# Patient Record
Sex: Female | Born: 1960 | Race: White | Hispanic: No | Marital: Married | Smoking: Never smoker
Health system: Southern US, Community
[De-identification: ages and names within clinical notes are randomized; demographics above are authoritative.]

## PROBLEM LIST (undated history)

## (undated) DIAGNOSIS — T7840XA Allergy, unspecified, initial encounter: Secondary | ICD-10-CM

## (undated) DIAGNOSIS — F319 Bipolar disorder, unspecified: Secondary | ICD-10-CM

## (undated) DIAGNOSIS — F32A Depression, unspecified: Secondary | ICD-10-CM

## (undated) DIAGNOSIS — E039 Hypothyroidism, unspecified: Secondary | ICD-10-CM

## (undated) DIAGNOSIS — M1990 Unspecified osteoarthritis, unspecified site: Secondary | ICD-10-CM

## (undated) DIAGNOSIS — Z87442 Personal history of urinary calculi: Secondary | ICD-10-CM

## (undated) DIAGNOSIS — N12 Tubulo-interstitial nephritis, not specified as acute or chronic: Secondary | ICD-10-CM

## (undated) DIAGNOSIS — E119 Type 2 diabetes mellitus without complications: Secondary | ICD-10-CM

## (undated) DIAGNOSIS — M797 Fibromyalgia: Secondary | ICD-10-CM

## (undated) DIAGNOSIS — J189 Pneumonia, unspecified organism: Secondary | ICD-10-CM

## (undated) DIAGNOSIS — I10 Essential (primary) hypertension: Secondary | ICD-10-CM

## (undated) HISTORY — PX: BLADDER INSTILLATION: SUR156

## (undated) HISTORY — PX: FRENULECTOMY, LINGUAL: SHX1681

## (undated) HISTORY — DX: Depression, unspecified: F32.A

## (undated) HISTORY — DX: Type 2 diabetes mellitus without complications: E11.9

## (undated) HISTORY — PX: CHALAZION EXCISION: SHX213

## (undated) HISTORY — PX: COLONOSCOPY: SHX174

## (undated) HISTORY — DX: Allergy, unspecified, initial encounter: T78.40XA

## (undated) HISTORY — PX: PALATE SURGERY: SHX729

## (undated) HISTORY — PX: URETHRAL DILATION: SUR417

---

## 1994-03-17 HISTORY — PX: LAPAROSCOPIC CHOLECYSTECTOMY: SUR755

## 1995-03-18 DIAGNOSIS — N12 Tubulo-interstitial nephritis, not specified as acute or chronic: Secondary | ICD-10-CM

## 1995-03-18 HISTORY — DX: Tubulo-interstitial nephritis, not specified as acute or chronic: N12

## 1997-07-18 ENCOUNTER — Other Ambulatory Visit: Admission: RE | Admit: 1997-07-18 | Discharge: 1997-07-18 | Payer: Self-pay | Admitting: Family Medicine

## 1997-07-20 ENCOUNTER — Ambulatory Visit (HOSPITAL_COMMUNITY): Admission: RE | Admit: 1997-07-20 | Discharge: 1997-07-20 | Payer: Self-pay

## 1999-04-30 ENCOUNTER — Encounter: Payer: Self-pay | Admitting: Family Medicine

## 1999-04-30 ENCOUNTER — Ambulatory Visit (HOSPITAL_COMMUNITY): Admission: RE | Admit: 1999-04-30 | Discharge: 1999-04-30 | Payer: Self-pay | Admitting: Family Medicine

## 1999-10-10 ENCOUNTER — Encounter: Admission: RE | Admit: 1999-10-10 | Discharge: 1999-10-10 | Payer: Self-pay | Admitting: Hematology and Oncology

## 1999-10-11 ENCOUNTER — Ambulatory Visit (HOSPITAL_COMMUNITY): Admission: RE | Admit: 1999-10-11 | Discharge: 1999-10-11 | Payer: Self-pay | Admitting: *Deleted

## 1999-12-06 ENCOUNTER — Encounter: Admission: RE | Admit: 1999-12-06 | Discharge: 1999-12-06 | Payer: Self-pay | Admitting: Internal Medicine

## 2000-02-28 ENCOUNTER — Encounter: Admission: RE | Admit: 2000-02-28 | Discharge: 2000-02-28 | Payer: Self-pay | Admitting: Internal Medicine

## 2000-06-25 ENCOUNTER — Encounter: Admission: RE | Admit: 2000-06-25 | Discharge: 2000-06-25 | Payer: Self-pay | Admitting: Hematology and Oncology

## 2000-07-09 ENCOUNTER — Encounter: Admission: RE | Admit: 2000-07-09 | Discharge: 2000-07-09 | Payer: Self-pay | Admitting: Hematology and Oncology

## 2000-09-28 ENCOUNTER — Encounter: Admission: RE | Admit: 2000-09-28 | Discharge: 2000-09-28 | Payer: Self-pay | Admitting: *Deleted

## 2001-01-26 ENCOUNTER — Encounter: Admission: RE | Admit: 2001-01-26 | Discharge: 2001-01-26 | Payer: Self-pay

## 2001-02-16 ENCOUNTER — Encounter: Admission: RE | Admit: 2001-02-16 | Discharge: 2001-02-16 | Payer: Self-pay | Admitting: *Deleted

## 2001-03-12 ENCOUNTER — Encounter: Admission: RE | Admit: 2001-03-12 | Discharge: 2001-03-12 | Payer: Self-pay

## 2001-06-25 ENCOUNTER — Encounter: Admission: RE | Admit: 2001-06-25 | Discharge: 2001-06-25 | Payer: Self-pay | Admitting: Internal Medicine

## 2001-07-06 ENCOUNTER — Encounter: Admission: RE | Admit: 2001-07-06 | Discharge: 2001-07-06 | Payer: Self-pay | Admitting: *Deleted

## 2001-10-15 ENCOUNTER — Encounter: Admission: RE | Admit: 2001-10-15 | Discharge: 2001-10-15 | Payer: Self-pay | Admitting: Internal Medicine

## 2001-12-07 ENCOUNTER — Emergency Department (HOSPITAL_COMMUNITY): Admission: EM | Admit: 2001-12-07 | Discharge: 2001-12-07 | Payer: Self-pay | Admitting: *Deleted

## 2001-12-10 ENCOUNTER — Encounter: Admission: RE | Admit: 2001-12-10 | Discharge: 2001-12-10 | Payer: Self-pay | Admitting: Internal Medicine

## 2001-12-17 ENCOUNTER — Encounter: Admission: RE | Admit: 2001-12-17 | Discharge: 2001-12-17 | Payer: Self-pay | Admitting: Internal Medicine

## 2002-05-06 ENCOUNTER — Encounter: Admission: RE | Admit: 2002-05-06 | Discharge: 2002-05-06 | Payer: Self-pay | Admitting: Internal Medicine

## 2002-05-18 ENCOUNTER — Encounter: Admission: RE | Admit: 2002-05-18 | Discharge: 2002-05-18 | Payer: Self-pay | Admitting: Internal Medicine

## 2002-05-20 ENCOUNTER — Ambulatory Visit (HOSPITAL_COMMUNITY): Admission: RE | Admit: 2002-05-20 | Discharge: 2002-05-20 | Payer: Self-pay | Admitting: Internal Medicine

## 2002-07-11 ENCOUNTER — Encounter: Admission: RE | Admit: 2002-07-11 | Discharge: 2002-07-11 | Payer: Self-pay | Admitting: Internal Medicine

## 2002-07-18 ENCOUNTER — Encounter: Admission: RE | Admit: 2002-07-18 | Discharge: 2002-07-18 | Payer: Self-pay | Admitting: Internal Medicine

## 2002-07-25 ENCOUNTER — Encounter: Admission: RE | Admit: 2002-07-25 | Discharge: 2002-07-25 | Payer: Self-pay | Admitting: Internal Medicine

## 2002-09-01 ENCOUNTER — Encounter: Admission: RE | Admit: 2002-09-01 | Discharge: 2002-09-01 | Payer: Self-pay | Admitting: Internal Medicine

## 2003-02-13 ENCOUNTER — Encounter: Admission: RE | Admit: 2003-02-13 | Discharge: 2003-02-13 | Payer: Self-pay | Admitting: Internal Medicine

## 2003-02-14 ENCOUNTER — Encounter: Admission: RE | Admit: 2003-02-14 | Discharge: 2003-02-14 | Payer: Self-pay | Admitting: Internal Medicine

## 2003-06-13 ENCOUNTER — Encounter: Admission: RE | Admit: 2003-06-13 | Discharge: 2003-06-13 | Payer: Self-pay | Admitting: Internal Medicine

## 2003-09-25 ENCOUNTER — Encounter: Admission: RE | Admit: 2003-09-25 | Discharge: 2003-09-25 | Payer: Self-pay | Admitting: Internal Medicine

## 2003-10-10 ENCOUNTER — Encounter: Admission: RE | Admit: 2003-10-10 | Discharge: 2003-10-10 | Payer: Self-pay | Admitting: Internal Medicine

## 2004-02-20 ENCOUNTER — Ambulatory Visit: Payer: Self-pay | Admitting: Internal Medicine

## 2004-03-22 ENCOUNTER — Ambulatory Visit: Payer: Self-pay | Admitting: Internal Medicine

## 2005-10-23 ENCOUNTER — Encounter: Admission: RE | Admit: 2005-10-23 | Discharge: 2005-10-23 | Payer: Self-pay | Admitting: Family Medicine

## 2007-04-16 ENCOUNTER — Encounter: Admission: RE | Admit: 2007-04-16 | Discharge: 2007-04-16 | Payer: Self-pay | Admitting: Family Medicine

## 2007-11-03 ENCOUNTER — Other Ambulatory Visit: Admission: RE | Admit: 2007-11-03 | Discharge: 2007-11-03 | Payer: Self-pay | Admitting: Obstetrics and Gynecology

## 2008-01-25 ENCOUNTER — Other Ambulatory Visit: Admission: RE | Admit: 2008-01-25 | Discharge: 2008-01-25 | Payer: Self-pay | Admitting: Obstetrics and Gynecology

## 2009-04-09 ENCOUNTER — Encounter: Admission: RE | Admit: 2009-04-09 | Discharge: 2009-04-09 | Payer: Self-pay | Admitting: Family Medicine

## 2010-04-07 ENCOUNTER — Encounter: Payer: Self-pay | Admitting: Family Medicine

## 2011-04-10 ENCOUNTER — Other Ambulatory Visit: Payer: Self-pay | Admitting: Family Medicine

## 2011-04-10 DIAGNOSIS — Z1231 Encounter for screening mammogram for malignant neoplasm of breast: Secondary | ICD-10-CM

## 2011-06-12 ENCOUNTER — Emergency Department (HOSPITAL_COMMUNITY)
Admission: EM | Admit: 2011-06-12 | Discharge: 2011-06-12 | Disposition: A | Discharge status: Home / Self Care | Payer: BC Managed Care – PPO | Attending: Emergency Medicine | Admitting: Emergency Medicine

## 2011-06-12 ENCOUNTER — Encounter (HOSPITAL_COMMUNITY): Payer: Self-pay | Admitting: Physical Medicine and Rehabilitation

## 2011-06-12 ENCOUNTER — Emergency Department (HOSPITAL_COMMUNITY): Payer: BC Managed Care – PPO

## 2011-06-12 DIAGNOSIS — D3500 Benign neoplasm of unspecified adrenal gland: Secondary | ICD-10-CM | POA: Insufficient documentation

## 2011-06-12 DIAGNOSIS — K5289 Other specified noninfective gastroenteritis and colitis: Secondary | ICD-10-CM | POA: Insufficient documentation

## 2011-06-12 DIAGNOSIS — I10 Essential (primary) hypertension: Secondary | ICD-10-CM | POA: Insufficient documentation

## 2011-06-12 DIAGNOSIS — K529 Noninfective gastroenteritis and colitis, unspecified: Secondary | ICD-10-CM

## 2011-06-12 DIAGNOSIS — R319 Hematuria, unspecified: Secondary | ICD-10-CM | POA: Insufficient documentation

## 2011-06-12 DIAGNOSIS — D259 Leiomyoma of uterus, unspecified: Secondary | ICD-10-CM | POA: Insufficient documentation

## 2011-06-12 DIAGNOSIS — R109 Unspecified abdominal pain: Secondary | ICD-10-CM | POA: Insufficient documentation

## 2011-06-12 HISTORY — DX: Essential (primary) hypertension: I10

## 2011-06-12 HISTORY — DX: Bipolar disorder, unspecified: F31.9

## 2011-06-12 HISTORY — DX: Hypothyroidism, unspecified: E03.9

## 2011-06-12 LAB — URINALYSIS, ROUTINE W REFLEX MICROSCOPIC
Bilirubin Urine: NEGATIVE
Glucose, UA: NEGATIVE mg/dL
Ketones, ur: NEGATIVE mg/dL
Nitrite: NEGATIVE
Protein, ur: NEGATIVE mg/dL
Specific Gravity, Urine: 1.022 (ref 1.005–1.030)
Urobilinogen, UA: 1 mg/dL (ref 0.0–1.0)
pH: 6 (ref 5.0–8.0)

## 2011-06-12 LAB — COMPREHENSIVE METABOLIC PANEL
ALT: 42 U/L — ABNORMAL HIGH (ref 0–35)
AST: 37 U/L (ref 0–37)
Albumin: 3.7 g/dL (ref 3.5–5.2)
Alkaline Phosphatase: 82 U/L (ref 39–117)
BUN: 22 mg/dL (ref 6–23)
CO2: 24 mEq/L (ref 19–32)
Calcium: 9.5 mg/dL (ref 8.4–10.5)
Chloride: 101 mEq/L (ref 96–112)
Creatinine, Ser: 0.61 mg/dL (ref 0.50–1.10)
GFR calc Af Amer: >90 mL/min (ref >90)
GFR calc non Af Amer: >90 mL/min (ref >90)
Glucose, Bld: 132 mg/dL — ABNORMAL HIGH (ref 70–99)
Potassium: 3.3 mEq/L — ABNORMAL LOW (ref 3.5–5.1)
Sodium: 137 mEq/L (ref 135–145)
Total Bilirubin: 0.5 mg/dL (ref 0.3–1.2)
Total Protein: 6.7 g/dL (ref 6.0–8.3)

## 2011-06-12 LAB — CBC
HCT: 42 % (ref 36.0–46.0)
Hemoglobin: 13.4 g/dL (ref 12.0–15.0)
MCH: 28.7 pg (ref 26.0–34.0)
MCHC: 31.9 g/dL (ref 30.0–36.0)
MCV: 89.9 fL (ref 78.0–100.0)
Platelets: 346 10*3/uL (ref 150–400)
RBC: 4.67 MIL/uL (ref 3.87–5.11)
RDW: 13.4 % (ref 11.5–15.5)
WBC: 11.6 10*3/uL — ABNORMAL HIGH (ref 4.0–10.5)

## 2011-06-12 LAB — URINE MICROSCOPIC-ADD ON

## 2011-06-12 LAB — DIFFERENTIAL
Basophils Absolute: 0 10*3/uL (ref 0.0–0.1)
Basophils Relative: 0 % (ref 0–1)
Eosinophils Absolute: 0 10*3/uL (ref 0.0–0.7)
Eosinophils Relative: 0 % (ref 0–5)
Lymphocytes Relative: 2 % — ABNORMAL LOW (ref 12–46)
Lymphs Abs: 0.2 10*3/uL — ABNORMAL LOW (ref 0.7–4.0)
Monocytes Absolute: 0.2 10*3/uL (ref 0.1–1.0)
Monocytes Relative: 2 % — ABNORMAL LOW (ref 3–12)
Neutro Abs: 11.2 10*3/uL — ABNORMAL HIGH (ref 1.7–7.7)
Neutrophils Relative %: 96 % — ABNORMAL HIGH (ref 43–77)

## 2011-06-12 LAB — LIPASE, BLOOD: Lipase: 21 U/L (ref 11–59)

## 2011-06-12 MED ORDER — ONDANSETRON HCL 4 MG/2ML IJ SOLN
4.0000 mg | Freq: Once | INTRAMUSCULAR | Status: AC
Start: 2011-06-12 — End: 2011-06-12
  Administered 2011-06-12: 4 mg via INTRAVENOUS
  Filled 2011-06-12: qty 2

## 2011-06-12 MED ORDER — NAPROXEN SODIUM 220 MG PO TABS: 440.0000 mg | ORAL_TABLET | Freq: Two times a day (BID) | ORAL | Status: DC | PRN

## 2011-06-12 MED ORDER — POTASSIUM CHLORIDE 20 MEQ/15ML (10%) PO LIQD
40.0000 meq | Freq: Once | ORAL | Status: AC
  Administered 2011-06-12: 40 meq via ORAL
  Filled 2011-06-12 (×2): qty 15

## 2011-06-12 MED ORDER — POTASSIUM CHLORIDE CRYS ER 20 MEQ PO TBCR
40.0000 meq | EXTENDED_RELEASE_TABLET | Freq: Once | ORAL | Status: DC
  Filled 2011-06-12: qty 2

## 2011-06-12 MED ORDER — SODIUM CHLORIDE 0.9 % IV BOLUS (SEPSIS)
1000.0000 mL | Freq: Once | INTRAVENOUS | Status: AC
  Administered 2011-06-12: 1000 mL via INTRAVENOUS

## 2011-06-12 MED ORDER — OXYCODONE HCL 5 MG PO TABS: 5.0000 mg | ORAL_TABLET | ORAL | Status: AC | PRN

## 2011-06-12 MED ORDER — HYDROMORPHONE HCL PF 1 MG/ML IJ SOLN
1.0000 mg | Freq: Once | INTRAMUSCULAR | Status: AC
  Administered 2011-06-12: 1 mg via INTRAVENOUS
  Filled 2011-06-12: qty 1

## 2011-06-12 MED ORDER — FAMOTIDINE IN NACL 20-0.9 MG/50ML-% IV SOLN
20.0000 mg | Freq: Once | INTRAVENOUS | Status: AC
  Administered 2011-06-12: 20 mg via INTRAVENOUS
  Filled 2011-06-12: qty 50

## 2011-06-12 MED ORDER — OXYCODONE-ACETAMINOPHEN 5-325 MG PO TABS: 1.0000 | ORAL_TABLET | ORAL | Status: DC | PRN

## 2011-06-12 MED ORDER — ONDANSETRON HCL 8 MG PO TABS: 8.0000 mg | ORAL_TABLET | Freq: Three times a day (TID) | ORAL | Status: AC | PRN

## 2011-06-12 MED ORDER — ONDANSETRON HCL 4 MG/2ML IJ SOLN
4.0000 mg | Freq: Once | INTRAMUSCULAR | Status: AC
  Administered 2011-06-12: 4 mg via INTRAVENOUS
  Filled 2011-06-12: qty 2

## 2011-06-12 MED ORDER — SODIUM CHLORIDE 0.9 % IV SOLN
INTRAVENOUS | Status: DC
  Administered 2011-06-12: 17:00:00 via INTRAVENOUS

## 2011-06-12 NOTE — Discharge Instructions (Signed)
Abdominal Pain (Nonspecific) Your exam might not show the exact reason you have abdominal pain. Since there are many different causes of abdominal pain, another checkup and more tests may be needed. It is very important to follow up for lasting (persistent) or worsening symptoms. A possible cause of abdominal pain in any person who still has his or her appendix is acute appendicitis. Appendicitis is often hard to diagnose. Normal blood tests, urine tests, ultrasound, and CT scans do not completely rule out early appendicitis or other causes of abdominal pain. Sometimes, only the changes that happen over time will allow appendicitis and other causes of abdominal pain to be determined. Other potential problems that may require surgery may also take time to become more apparent. Because of this, it is important that you follow all of the instructions below. HOME CARE INSTRUCTIONS   Rest as much as possible.   Do not eat solid food until your pain is gone.   While adults or children have pain: A diet of water, weak decaffeinated tea, broth or bouillon, gelatin, oral rehydration solutions (ORS), frozen ice pops, or ice chips may be helpful.   When pain is gone in adults or children: Start a light diet (dry toast, crackers, applesauce, or white rice). Increase the diet slowly as long as it does not bother you. Eat no dairy products (including cheese and eggs) and no spicy, fatty, fried, or high-fiber foods.   Use no alcohol, caffeine, or cigarettes.   Take your regular medicines unless your caregiver told you not to.   Take any prescribed medicine as directed.   Only take over-the-counter or prescription medicines for pain, discomfort, or fever as directed by your caregiver. Do not give aspirin to children.  If your caregiver has given you a follow-up appointment, it is very important to keep that appointment. Not keeping the appointment could result in a permanent injury and/or lasting (chronic) pain  and/or disability. If there is any problem keeping the appointment, you must call to reschedule.  SEEK IMMEDIATE MEDICAL CARE IF:   Your pain is not gone in 24 hours.   Your pain becomes worse, changes location, or feels different.   You or your child has an oral temperature above 102 F (38.9 C), not controlled by medicine.   Your baby is older than 3 months with a rectal temperature of 102 F (38.9 C) or higher.   Your baby is 56 months old or younger with a rectal temperature of 100.4 F (38 C) or higher.   You have shaking chills.   You keep throwing up (vomiting) or cannot drink liquids.   There is blood in your vomit or you see blood in your bowel movements.   Your bowel movements become dark or black.   You have frequent bowel movements.   Your bowel movements stop (become blocked) or you cannot pass gas.   You have bloody, frequent, or painful urination.   You have yellow discoloration in the skin or whites of the eyes.   Your stomach becomes bloated or bigger.   You have dizziness or fainting.   You have chest or back pain.  MAKE SURE YOU:   Understand these instructions.   Will watch your condition.   Will get help right away if you are not doing well or get worse.  Document Released: 03/03/2005 Document Revised: 02/20/2011 Document Reviewed: 01/29/2009 [facility] Patient Information 2012 [place], [place].Clear Liquid Diet The clear liquid dietconsists of foods that are liquid or will  become liquid at room temperature.You should be able to see through the liquid and beverages. Examples of foods allowed on a clear liquid diet include fruit juice, broth or bouillon, gelatin, or frozen ice pops. The purpose of this diet is to provide necessary fluid, electrolytes such as sodium and potassium, and energy to keep the body functioning during times when you are not able to consume a regular diet.A clear liquid diet should not be continued for long periods of time as  it is not nutritionally adequate.  REASONS FOR USING A CLEAR LIQUID DIET  In sudden onset (acute) conditions for a patient before or after surgery.   As the first step in oral feeding.   For fluid and electrolyte replacement in diarrheal diseases.   As a diet before certain medical tests are performed.  ADEQUACY The clear liquid diet is adequate only in ascorbic acid, according to the Recommended Dietary Allowances of the [employer]. CHOOSING FOODS Breads and Starches  Allowed:  None are allowed.   Avoid: All are avoided.  Vegetables  Allowed:  Strained tomato or vegetable juice.   Avoid: Any others.  Fruit  Allowed:  Strained fruit juices and fruit drinks. Include 1 serving of citrus or vitamin C-enriched fruit juice daily.   Avoid: Any others.  Meat and Meat Substitutes  Allowed:  None are allowed.   Avoid: All are avoided.  Milk  Allowed:  None are allowed.   Avoid: All are avoided.  Soups and Combination Foods  Allowed:  Clear bouillon, broth, or strained broth-based soups.   Avoid: Any others.  Desserts and Sweets  Allowed:  Sugar, honey. High protein gelatin. Flavored gelatin, ices, or frozen ice pops that do not contain milk.   Avoid: Any others.  Fats and Oils  Allowed:  None are allowed.   Avoid: All are avoided.  Beverages  Allowed: Cereal beverages, coffee (regular or decaffeinated), tea, or soda at the discretion of your caregiver.   Avoid: Any others.  Condiments  Allowed:  Iodized salt.   Avoid: Any others, including pepper.  Supplements  Allowed:  Liquid nutrition beverages.   Avoid: Any others that contain lactose or fiber.  SAMPLE MEAL PLAN Breakfast  4 oz (120 mL) strained orange juice.    to 1 cup (125 to 250 mL) gelatin (plain or fortified).   1 cup (250 mL) beverage (coffee or tea).   Sugar, if desired.  Midmorning Snack   cup (125 mL) gelatin (plain or fortified).  Lunch  1 cup (250 mL) broth or  consomm.   4 oz (120 mL) strained grapefruit juice.    cup (125 mL) gelatin (plain or fortified).   1 cup (250 mL) beverage (coffee or tea).   Sugar, if desired.  Midafternoon Snack   cup (125 mL) fruit ice.    cup (125 mL) strained fruit juice.  Dinner  1 cup (250 mL) broth or consomm.    cup (125 mL) cranberry juice.    cup (125 mL) flavored gelatin (plain or fortified).   1 cup (250 mL) beverage (coffee or tea).   Sugar, if desired.  Evening Snack  4 oz (120 mL) strained apple juice (vitamin C-fortified).    cup (125 mL) flavored gelatin (plain or fortified).  Document Released: 03/03/2005 Document Revised: 02/20/2011 Document Reviewed: 05/31/2010 [facility] Patient Information 2012 [place], [place].Hematuria, Adult Hematuria (blood in your urine) can be caused by a bladder infection (cystitis), kidney infection (pyelonephritis), prostate infection (prostatitis), or kidney stone. Infections  will usually respond to antibiotics (medications which kill germs), and a kidney stone will usually pass through your urine without further treatment. If you were put on antibiotics, take all the medicine until gone. You may feel better in a few days, but take all of your medicine or the infection may not respond and become more difficult to treat. If antibiotics were not given, an infection did not cause the blood in the urine. A further work up to find out the reason may be needed. HOME CARE INSTRUCTIONS   Drink lots of fluid, 3 to 4 quarts a day. If you have been diagnosed with an infection, cranberry juice is especially recommended, in addition to large amounts of water.   Avoid caffeine, tea, and carbonated beverages, because they tend to irritate the bladder.   Avoid alcohol as it may irritate the prostate.   Only take over-the-counter or prescription medicines for pain, discomfort, or fever as directed by your caregiver.   If you have been diagnosed with a kidney stone  follow your caregivers instructions regarding straining your urine to catch the stone.  TO PREVENT FURTHER INFECTIONS:  Empty the bladder often. Avoid holding urine for long periods of time.   After a bowel movement, women should cleanse front to back. Use each tissue only once.   Empty the bladder before and after sexual intercourse if you are a female.   Return to your caregiver if you develop back pain, fever, nausea (feeling sick to your stomach), vomiting, or your symptoms (problems) are not better in 3 days. Return sooner if you are getting worse.  If you have been requested to return for further testing make sure to keep your appointments. If an infection is not the cause of blood in your urine, X-rays may be required. Your caregiver will discuss this with you. SEEK IMMEDIATE MEDICAL CARE IF:   You have a persistent fever over 102 F (38.9 C).   You develop severe vomiting and are unable to keep the medication down.   You develop severe back or abdominal pain despite taking your medications.   You begin passing a large amount of blood or clots in your urine.   You feel extremely weak or faint, or pass out.  MAKE SURE YOU:   Understand these instructions.   Will watch your condition.   Will get help right away if you are not doing well or get worse.  Document Released: 03/03/2005 Document Revised: 02/20/2011 Document Reviewed: 10/21/2007 [facility] Patient Information 2012 [place], [place].Viral Gastroenteritis Viral gastroenteritis is also known as stomach flu. This condition affects the stomach and intestinal tract. It can cause sudden diarrhea and vomiting. The illness typically lasts 3 to 8 days. Most people develop an immune response that eventually gets rid of the virus. While this natural response develops, the virus can make you quite ill. CAUSES  Many different viruses can cause gastroenteritis, such as rotavirus or noroviruses. You can catch one of these viruses by  consuming contaminated food or water. You may also catch a virus by sharing utensils or other personal items with an infected person or by touching a contaminated surface. SYMPTOMS  The most common symptoms are diarrhea and vomiting. These problems can cause a severe loss of body fluids (dehydration) and a body salt (electrolyte) imbalance. Other symptoms may include:  Fever.   Headache.   Fatigue.   Abdominal pain.  DIAGNOSIS  Your caregiver can usually diagnose viral gastroenteritis based on your symptoms and a physical exam.  A stool sample may also be taken to test for the presence of viruses or other infections. TREATMENT  This illness typically goes away on its own. Treatments are aimed at rehydration. The most serious cases of viral gastroenteritis involve vomiting so severely that you are not able to keep fluids down. In these cases, fluids must be given through an intravenous line (IV). HOME CARE INSTRUCTIONS   Drink enough fluids to keep your urine clear or pale yellow. Drink small amounts of fluids frequently and increase the amounts as tolerated.   Ask your caregiver for specific rehydration instructions.   Avoid:   Foods high in sugar.   Alcohol.   Carbonated drinks.   Tobacco.   Juice.   Caffeine drinks.   Extremely hot or cold fluids.   Fatty, greasy foods.   Too much intake of anything at one time.   Dairy products until 24 to 48 hours after diarrhea stops.   You may consume probiotics. Probiotics are active cultures of beneficial bacteria. They may lessen the amount and number of diarrheal stools in adults. Probiotics can be found in yogurt with active cultures and in supplements.   Wash your hands well to avoid spreading the virus.   Only take over-the-counter or prescription medicines for pain, discomfort, or fever as directed by your caregiver. Do not give aspirin to children. Antidiarrheal medicines are not recommended.   Ask your caregiver if you  should continue to take your regular prescribed and over-the-counter medicines.   Keep all follow-up appointments as directed by your caregiver.  SEEK IMMEDIATE MEDICAL CARE IF:   You are unable to keep fluids down.   You do not urinate at least once every 6 to 8 hours.   You develop shortness of breath.   You notice blood in your stool or vomit. This may look like coffee grounds.   You have abdominal pain that increases or is concentrated in one small area (localized).   You have persistent vomiting or diarrhea.   You have a fever.   The patient is a child younger than 3 months, and he or she has a fever.   The patient is a child older than 3 months, and he or she has a fever and persistent symptoms.   The patient is a child older than 3 months, and he or she has a fever and symptoms suddenly get worse.   The patient is a baby, and he or she has no tears when crying.  MAKE SURE YOU:   Understand these instructions.   Will watch your condition.   Will get help right away if you are not doing well or get worse.  Document Released: 03/03/2005 Document Revised: 02/20/2011 Document Reviewed: 12/18/2010 [facility] Patient Information 2012 [place], [place].

## 2011-06-12 NOTE — ED Notes (Signed)
Pt presents to department for evaluation of N/V/D and LLQ abdominal pain. Pt states onset early this morning, started to LLQ pain and then progressed to vomiting and diarrhea. States she has been unable to keep down food/fluids all morning. 2/10 LLQ "soreness" and the time. Denies urinary problems. Pt alert and oriented x4.

## 2011-06-12 NOTE — ED Notes (Signed)
Patient states onset couple of days nausea vomiting and diarrhea with abdominal pain LLQ with radiating left flank pain.  Pain 7-10/10 achy.  Airway intact bilateral equal chest rise and fall. Abdomen soft distended patient is obese.

## 2011-06-12 NOTE — ED Provider Notes (Addendum)
History     CSN: [id number]  Arrival date & time 06/12/11  1346   First MD Initiated Contact with Patient 06/12/11 1409      Chief Complaint  Patient presents with  . Abdominal Pain  . Nausea  . Diarrhea    (Consider location/radiation/quality/duration/timing/severity/associated sxs/prior treatment) Patient is a 51 y.o. female presenting with abdominal pain. The history is provided by the patient.  Abdominal Pain The primary symptoms of the illness include abdominal pain, nausea, vomiting and diarrhea. The primary symptoms of the illness do not include fever, fatigue, shortness of breath, hematemesis, hematochezia or dysuria. The current episode started 6 to 12 hours ago. The onset of the illness was gradual. The problem has been gradually improving.  The abdominal pain began 6 to 12 hours ago. The pain came on gradually. The abdominal pain has been gradually improving since its onset. The abdominal pain is located in the LLQ, left flank and right flank. The abdominal pain does not radiate. The severity of the abdominal pain is 8/10. The abdominal pain is relieved by passing flatus and bowel movement. Exacerbated by: Deep palpation.  Nausea began today.  The vomiting began today. Vomiting occurs 2 to 5 times per day. The emesis contains stomach contents.  The diarrhea began today. The diarrhea is watery [place], without blood or mucus). The diarrhea occurs 2 to 4 times per day.  The patient has had a change in bowel habit (Diarrhea today). Additional symptoms associated with the illness include anorexia. Symptoms associated with the illness do not include chills, diaphoresis, heartburn, constipation, urgency, hematuria, frequency or back pain. Significant associated medical issues do not include inflammatory bowel disease or diverticulitis.    Past Medical History  Diagnosis Date  . Hypertension   . Bipolar 1 disorder   . Hypothyroid     History reviewed. No pertinent past surgical  history.  No family history on file.  History  Substance Use Topics  . Smoking status: Never Smoker   . Smokeless tobacco: Not on file  . Alcohol Use: No    OB History    Grav Para Term Preterm Abortions TAB SAB Ect Mult Living                  Review of Systems  Constitutional: Negative for fever, chills, diaphoresis and fatigue.  Respiratory: Negative for shortness of breath.   Gastrointestinal: Positive for nausea, vomiting, abdominal pain, diarrhea and anorexia. Negative for heartburn, constipation, hematochezia and hematemesis.  Genitourinary: Negative for dysuria, urgency, frequency and hematuria.  Musculoskeletal: Negative for back pain.  All other systems reviewed and are negative.    Allergies  Corn-containing products; Dolobid; Doxycycline; Erythromycin; Eucalyptus oil; Haldol; Keflex; Macrodantin; Molds & smuts; Penicillins; Septra; Tetracyclines & related; Tofranil-pm; and Vicodin  Home Medications   Current Outpatient Rx  Name Route Sig Dispense Refill  . BENAZEPRIL HCL 20 MG PO TABS Oral Take 20 mg by mouth daily.    Marland Kitchen HYDROCHLOROTHIAZIDE 25 MG PO TABS Oral Take 25 mg by mouth daily.    Marland Kitchen HYPROMELLOSE 2.5 % OP SOLN Both Eyes Place 2 drops into both eyes 2 (two) times daily as needed. For dry eyes    . LEVOTHYROXINE SODIUM 137 MCG PO TABS Oral Take 137 mcg by mouth daily.    Marland Kitchen LITHIUM CARBONATE 300 MG PO CAPS Oral Take 1,200 mg by mouth every evening.    Marland Kitchen NAPROXEN SODIUM 220 MG PO TABS Oral Take 440 mg by mouth 2 (two) times  daily with a meal.      BP 143/85  Pulse 90  Temp 97.5 F (36.4 C)  Resp 20  SpO2 98%  Physical Exam  Nursing note and vitals reviewed. Constitutional: She is oriented to person, place, and time. She appears well-developed and well-nourished. No distress.  HENT:  Head: Atraumatic.  Eyes: EOM are normal. Pupils are equal, round, and reactive to light.  Neck: Normal range of motion. Neck supple.  Cardiovascular: Normal rate,  regular rhythm, normal heart sounds and intact distal pulses.  Exam reveals no gallop and no friction rub.   No murmur heard. Pulmonary/Chest: Effort normal and breath sounds normal. No respiratory distress. She has no wheezes. She has no rales.  Abdominal: Soft. Bowel sounds are normal. She exhibits no distension, no fluid wave, no ascites and no mass. There is no hepatosplenomegaly. There is tenderness in the left lower quadrant. There is no rigidity, no rebound, no guarding and no CVA tenderness.       Tenderness at the left lower quadrant is mild and elicited only with deep palpation.  Musculoskeletal: Normal range of motion. She exhibits no edema and no tenderness.  Neurological: She is alert and oriented to person, place, and time. No cranial nerve deficit. She exhibits normal muscle tone. Coordination normal.  Skin: Skin is warm and dry. No rash noted. She is not diaphoretic. No erythema. No pallor.  Psychiatric: She has a normal mood and affect. Her behavior is normal.    ED Course  Procedures (including critical care time)  Labs Reviewed  URINALYSIS, ROUTINE W REFLEX MICROSCOPIC - Abnormal; Notable for the following:    APPearance CLOUDY (*)    Hgb urine dipstick LARGE (*)    Leukocytes, UA MODERATE (*)    All other components within normal limits  URINE MICROSCOPIC-ADD ON - Abnormal; Notable for the following:    Squamous Epithelial / LPF MANY (*)    All other components within normal limits  CBC  DIFFERENTIAL  COMPREHENSIVE METABOLIC PANEL  LIPASE, BLOOD   No results found.   No diagnosis found.    MDM  Gastritis, peptic ulcer disease, hepatitis, renal colic, urinary tract infection, colitis, gastroenteritis all considered among other etiologies in the patient's differential diagnosis.        Felisa Bonier, MD 06/12/11 1442  5:02 PM At this time the patient's symptoms have dramatically improved. Her CT scan does not show a ureteric stone. This may mean  that there was a ureteric stone that has artery passed into the bladder and we are not seeing on CT scan, or it may mean that the patient has asymptomatic hematuria that is not related to her abdominal discomfort and other symptoms. Regardless, it is not a kidney stone, it is a gastroenteritis, and I will treat her symptoms as such.  Felisa Bonier, MD 06/12/11 [phone]

## 2011-06-24 ENCOUNTER — Ambulatory Visit: Payer: Self-pay

## 2011-07-23 ENCOUNTER — Ambulatory Visit
Admission: RE | Admit: 2011-07-23 | Discharge: 2011-07-23 | Disposition: A | Discharge status: Home / Self Care | Payer: BC Managed Care – PPO | Source: Ambulatory Visit | Attending: Family Medicine | Admitting: Family Medicine

## 2011-07-23 DIAGNOSIS — Z1231 Encounter for screening mammogram for malignant neoplasm of breast: Secondary | ICD-10-CM

## 2012-10-04 ENCOUNTER — Other Ambulatory Visit: Payer: Self-pay | Admitting: Family Medicine

## 2013-03-02 ENCOUNTER — Telehealth: Payer: Self-pay | Admitting: Family Medicine

## 2013-03-02 MED ORDER — LEVOTHYROXINE SODIUM 137 MCG PO TABS: 137.0000 ug | ORAL_TABLET | Freq: Every day | ORAL | Status: DC

## 2013-03-02 NOTE — Telephone Encounter (Signed)
Med sent to pharm 

## 2013-03-02 NOTE — Telephone Encounter (Signed)
Message copied by Ricard Dillon on Wed Mar 02, 2013  5:11 PM ------      Message from: [facility], Maralyn Sago J      Created: Wed Mar 02, 2013  4:06 PM       She needs 1 month of Levothyroxin 137 mcgs called in to [facility].  She has made a lab appt for 03/18/2013 for labs ------

## 2013-03-18 ENCOUNTER — Other Ambulatory Visit: Payer: BC Managed Care – PPO

## 2013-03-18 DIAGNOSIS — E039 Hypothyroidism, unspecified: Secondary | ICD-10-CM

## 2013-03-18 DIAGNOSIS — Z79899 Other long term (current) drug therapy: Secondary | ICD-10-CM

## 2013-03-18 DIAGNOSIS — E785 Hyperlipidemia, unspecified: Secondary | ICD-10-CM

## 2013-03-18 LAB — CBC WITH DIFFERENTIAL/PLATELET
Basophils Absolute: 0.1 10*3/uL (ref 0.0–0.1)
Basophils Relative: 1 % (ref 0–1)
Eosinophils Absolute: 0.5 10*3/uL (ref 0.0–0.7)
Eosinophils Relative: 6 % — ABNORMAL HIGH (ref 0–5)
HCT: 39.1 % (ref 36.0–46.0)
Hemoglobin: 12.7 g/dL (ref 12.0–15.0)
Lymphocytes Relative: 24 % (ref 12–46)
Lymphs Abs: 1.9 10*3/uL (ref 0.7–4.0)
MCH: 29.1 pg (ref 26.0–34.0)
MCHC: 32.5 g/dL (ref 30.0–36.0)
MCV: 89.5 fL (ref 78.0–100.0)
Monocytes Absolute: 0.4 10*3/uL (ref 0.1–1.0)
Monocytes Relative: 5 % (ref 3–12)
Neutro Abs: 5 10*3/uL (ref 1.7–7.7)
Neutrophils Relative %: 64 % (ref 43–77)
Platelets: 372 10*3/uL (ref 150–400)
RBC: 4.37 MIL/uL (ref 3.87–5.11)
RDW: 12.9 % (ref 11.5–15.5)
WBC: 7.9 10*3/uL (ref 4.0–10.5)

## 2013-03-18 LAB — COMPREHENSIVE METABOLIC PANEL
ALT: 30 U/L (ref 0–35)
AST: 23 U/L (ref 0–37)
Albumin: 4 g/dL (ref 3.5–5.2)
Alkaline Phosphatase: 85 U/L (ref 39–117)
BUN: 15 mg/dL (ref 6–23)
CO2: 27 mEq/L (ref 19–32)
Calcium: 9.5 mg/dL (ref 8.4–10.5)
Chloride: 105 mEq/L (ref 96–112)
Creat: 0.72 mg/dL (ref 0.50–1.10)
Glucose, Bld: 101 mg/dL — ABNORMAL HIGH (ref 70–99)
Potassium: 4.3 mEq/L (ref 3.5–5.3)
Sodium: 138 mEq/L (ref 135–145)
Total Bilirubin: 0.4 mg/dL (ref 0.3–1.2)
Total Protein: 6.6 g/dL (ref 6.0–8.3)

## 2013-03-18 LAB — TSH: TSH: 2.342 u[IU]/mL (ref 0.350–4.500)

## 2013-03-18 LAB — LIPID PANEL
Cholesterol: 181 mg/dL (ref 0–200)
HDL: 40 mg/dL (ref >39)
LDL Cholesterol: 117 mg/dL — ABNORMAL HIGH (ref 0–99)
Total CHOL/HDL Ratio: 4.5 Ratio
Triglycerides: 121 mg/dL (ref <150)
VLDL: 24 mg/dL (ref 0–40)

## 2013-03-21 ENCOUNTER — Encounter: Payer: Self-pay | Admitting: *Deleted

## 2013-04-06 ENCOUNTER — Other Ambulatory Visit: Payer: Self-pay | Admitting: Family Medicine

## 2013-04-27 ENCOUNTER — Other Ambulatory Visit: Payer: Self-pay | Admitting: Family Medicine

## 2013-05-07 ENCOUNTER — Other Ambulatory Visit: Payer: Self-pay | Admitting: Family Medicine

## 2013-06-12 ENCOUNTER — Other Ambulatory Visit: Payer: Self-pay | Admitting: Family Medicine

## 2013-06-13 ENCOUNTER — Ambulatory Visit (INDEPENDENT_AMBULATORY_CARE_PROVIDER_SITE_OTHER): Payer: BC Managed Care – PPO | Admitting: Physician Assistant

## 2013-06-13 ENCOUNTER — Encounter: Payer: Self-pay | Admitting: Physician Assistant

## 2013-06-13 VITALS — BP 118/80 | HR 78 | Temp 97.6°F | Resp 20 | Ht 66.0 in | Wt 241.0 lb

## 2013-06-13 DIAGNOSIS — L03031 Cellulitis of right toe: Secondary | ICD-10-CM

## 2013-06-13 DIAGNOSIS — L03039 Cellulitis of unspecified toe: Secondary | ICD-10-CM

## 2013-06-13 MED ORDER — FLUCONAZOLE 150 MG PO TABS: ORAL_TABLET | ORAL | Status: DC

## 2013-06-13 MED ORDER — CLOTRIMAZOLE-BETAMETHASONE 1-0.05 % EX CREA: 1.0000 "application " | TOPICAL_CREAM | Freq: Two times a day (BID) | CUTANEOUS | Status: DC

## 2013-06-13 MED ORDER — CIPROFLOXACIN HCL 500 MG PO TABS: 500.0000 mg | ORAL_TABLET | Freq: Two times a day (BID) | ORAL | Status: DC

## 2013-06-14 ENCOUNTER — Encounter: Payer: Self-pay | Admitting: Physician Assistant

## 2013-06-14 NOTE — Progress Notes (Signed)
Patient ID: Leah Powers MRN: [id number], DOB: [birth year], 53 y.o. Date of Encounter: 06/14/2013, 2:01 PM    Chief Complaint:  Chief Complaint  Patient presents with  . infection on right great toe    X friday evening     HPI: 53 y.o. year old white female says that on Friday 06/10/13 she had just been around the house. Says she may have walked out to the mailbox but otherwise she was doing usual activities in her house. She noticed that her left toes felt uncomfortable but thought that was just the sock and the shoe needing to be adjusted. When she went to adjust this, she noticed that there was a blister on the medial side of her toe nail on the first left toe. Says at that time the color was just a normal color and it  looked like a regular blister. However since then she has developed a red / purple color around the toenail. As well she has seen some drainage coming from the area and says that it has been a purulent yellow drainage. Has Had no fevers or chills. Says there definitely was no trauma or injury to the toe.  Home Meds: See attached medication section for any medications that were entered at today's visit. The computer does not put those onto this list.The following list is a list of meds entered prior to today's visit.   Current Outpatient Prescriptions on File Prior to Visit  Medication Sig Dispense Refill  . benazepril (LOTENSIN) 20 MG tablet TAKE ONE TABLET BY MOUTH EVERY DAY  90 tablet  1  . desonide (DESOWEN) 0.05 % cream APPLY CREAM TWICE DAILY FOR 10 DAYS.  30 g  0  . hydrochlorothiazide (HYDRODIURIL) 25 MG tablet TAKE ONE TABLET BY MOUTH EVERY DAY  90 tablet  0  . hydroxypropyl methylcellulose (ISOPTO TEARS) 2.5 % ophthalmic solution Place 2 drops into both eyes 2 (two) times daily as needed. For dry eyes      . levothyroxine (SYNTHROID, LEVOTHROID) 137 MCG tablet TAKE ONE TABLET BY MOUTH ONCE DAILY  30 tablet  1  . lithium carbonate 300 MG capsule Take 1,200 mg  by mouth every evening.      . naproxen sodium (ANAPROX) 220 MG tablet Take 2 tablets (440 mg total) by mouth every 12 (twelve) hours as needed (pain).  20 tablet  0   No current facility-administered medications on file prior to visit.    Allergies:  Allergies  Allergen Reactions  . Corn-Containing Products   . Dolobid [Diflunisal]   . Doxycycline   . Erythromycin   . Eucalyptus Oil   . Haldol [Haloperidol Decanoate]   . Keflex [Cephalexin]   . Macrodantin   . Molds & Smuts   . Penicillins   . Septra [Bactrim]   . Tetracyclines & Related   . Tofranil-Pm   . Vicodin [Hydrocodone-Acetaminophen]       Review of Systems: See HPI for pertinent ROS. All other ROS negative.    Physical Exam: Blood pressure 118/80, pulse 78, temperature 97.6 F (36.4 C), temperature source Oral, resp. rate 20, height 5\' 6"  (1.676 m), weight 241 lb (109.317 kg)., Body mass index is 38.92 kg/(m^2). General: Obese WF.  Appears in no acute distress. Lungs: Clear bilaterally to auscultation without wheezes, rales, or rhonchi. Breathing is unlabored. Heart: Regular rhythm. No murmurs, rubs, or gallops. Msk:  Strength and tone normal for age. Skin: LEFT 1st TOE: Around  the medial edge  of the toe nail, there is area of pink coloration and also an area of almost purplish coloration. There is also a very tiny area of blister. The toenail itself has areas that are thickened and yellow consistent with onychomycosis. Neuro: Alert and oriented X 3. Moves all extremities spontaneously. Gait is normal. CNII-XII grossly in tact. Psych:  Responds to questions appropriately with a normal affect.     ASSESSMENT AND PLAN:  53 y.o. year old female with  1. Paronychia of great toe of right foot I pressed on the blister area to try to obtain some drainage because patient says that it had been draining at home.  However was unable to obtain any drainage with this so I did use a scalpel and made a very tiny incision  and was able to get out some drainage. It was a very pasty white material. I did obtain a routine bacterial culture as well as had the lab and perform a KOH. Lab reported that it did look consistent with yeast. Go ahead and cover for both Pseudomonas and Candida which are common causes of paronychia. The patient has multiple medication allergies including Keflex, penicillin, tetracycline, Bactrim.  Would've liked to use one of these medications to better cover staph aureus but have to avoid these. She is to start below medications now. F/U if  site worsens or if it is not back to normal when she completes the medications.  I also noted: she does NOT have Diabetes.   - Culture, routine-abscess - fluconazole (DIFLUCAN) 150 MG tablet; Once weekly for 3 weeks.  Dispense: 3 tablet; Refill: 0 - clotrimazole-betamethasone (LOTRISONE) cream; Apply 1 application topically 2 (two) times daily.  Dispense: 30 g; Refill: 0 - ciprofloxacin (CIPRO) 500 MG tablet; Take 1 tablet (500 mg total) by mouth 2 (two) times daily.  Dispense: 20 tablet; Refill: 0   Signed, [address] [place], [place], [facility] 06/14/2013 2:01 PM

## 2013-06-17 LAB — CULTURE, ROUTINE-ABSCESS
Gram Stain: NONE SEEN
Gram Stain: NONE SEEN
Gram Stain: NONE SEEN

## 2013-07-06 ENCOUNTER — Other Ambulatory Visit: Payer: Self-pay | Admitting: Family Medicine

## 2013-07-25 ENCOUNTER — Other Ambulatory Visit: Payer: Self-pay | Admitting: Family Medicine

## 2013-08-03 ENCOUNTER — Ambulatory Visit (INDEPENDENT_AMBULATORY_CARE_PROVIDER_SITE_OTHER): Payer: BC Managed Care – PPO | Admitting: Physician Assistant

## 2013-08-03 ENCOUNTER — Encounter: Payer: Self-pay | Admitting: Physician Assistant

## 2013-08-03 VITALS — BP 132/76 | HR 72 | Temp 98.5°F | Resp 18 | Wt 240.0 lb

## 2013-08-03 DIAGNOSIS — L0291 Cutaneous abscess, unspecified: Secondary | ICD-10-CM

## 2013-08-03 DIAGNOSIS — L0390 Cellulitis, unspecified: Secondary | ICD-10-CM

## 2013-08-03 MED ORDER — CIPROFLOXACIN HCL 500 MG PO TABS: 500.0000 mg | ORAL_TABLET | Freq: Two times a day (BID) | ORAL | Status: DC

## 2013-08-03 NOTE — Progress Notes (Signed)
Patient ID: Leah Powers MRN: [id number], DOB: 1961-02-07, 53 y.o. Date of Encounter: 08/03/2013, 4:39 PM    Chief Complaint:  Chief Complaint  Patient presents with  . lesions on neck,scalp, face     HPI: 53 y.o. year old white female says this all began  about 2 weeks ago when she noticed a few acne-type bumps on her face that she usually does not have.  Says sometime after that she noticed "nodule on scalp." Says that then most recently she noticed that there was something on the back of her neck--she picked at it and it was piece of scab. She has applied tee- tree oil to the areas. Also applied some cortisone cream. Also has  used coconut oil.      Home Meds: See attached medication section for any medications that were entered at today's visit. The computer does not put those onto this list.The following list is a list of meds entered prior to today's visit.   Current Outpatient Prescriptions on File Prior to Visit  Medication Sig Dispense Refill  . benazepril (LOTENSIN) 20 MG tablet TAKE ONE TABLET BY MOUTH EVERY DAY  90 tablet  1  . hydrochlorothiazide (HYDRODIURIL) 25 MG tablet TAKE ONE TABLET BY MOUTH ONCE DAILY  90 tablet  0  . hydroxypropyl methylcellulose (ISOPTO TEARS) 2.5 % ophthalmic solution Place 2 drops into both eyes 2 (two) times daily as needed. For dry eyes      . levothyroxine (SYNTHROID, LEVOTHROID) 137 MCG tablet TAKE ONE TABLET BY MOUTH ONCE DAILY.  30 tablet  3  . lithium carbonate 300 MG capsule Take 1,200 mg by mouth every evening.      . cetirizine (ZYRTEC) 5 MG tablet Take 5 mg by mouth daily.      . naproxen sodium (ANAPROX) 220 MG tablet Take 2 tablets (440 mg total) by mouth every 12 (twelve) hours as needed (pain).  20 tablet  0   No current facility-administered medications on file prior to visit.    Allergies:  Allergies  Allergen Reactions  . Corn-Containing Products   . Dolobid [Diflunisal]   . Doxycycline   . Erythromycin   .  Eucalyptus Oil   . Haldol [Haloperidol Decanoate]   . Keflex [Cephalexin]   . Macrodantin   . Molds & Smuts   . Penicillins   . Septra [Bactrim]   . Tetracyclines & Related   . Tofranil-Pm   . Vicodin [Hydrocodone-Acetaminophen]       Review of Systems: See HPI for pertinent ROS. All other ROS negative.    Physical Exam: Blood pressure 132/76, pulse 72, temperature 98.5 F (36.9 C), temperature source Oral, resp. rate 18, weight 240 lb (108.863 kg)., Body mass index is 38.76 kg/(m^2). General: Obese WF.  Appears in no acute distress. Lungs: Clear bilaterally to auscultation without wheezes, rales, or rhonchi. Breathing is unlabored. Heart: Regular rhythm. No murmurs, rubs, or gallops. Msk:  Strength and tone normal for age. Extremities/Skin: Warm and dry. Face: Forehead with a few acne type areas, which are resolving/drying up.  Top of head--scalp: 1 cm diameter area, appprox 63mm deep "crater" covered with scab.  Posterior Neck: There are 3 lesions. Each is approx 1 cm diameter, 3 mm deep crater, covered with scab. Each is surrounded by pink erythema--approx 1 cm width of erythema around periphery of each crater. Neuro: Alert and oriented X 3. Moves all extremities spontaneously. Gait is normal. CNII-XII grossly in tact. Psych:  Responds to questions  appropriately with a normal affect.     ASSESSMENT AND PLAN:  53 y.o. year old female with  1. Cellulitis She is allergic to multiple antibiotics, severely limiting treatment options. She says that doxycycline caused significant red bumps all over her belly. She says that Bactrim caused her throat to swell. She says the Keflex has caused severe abdominal distress/symptoms. Also very allergic to PCN per pt.  Does comment that the Cipro I gave her at her last visit worked well and she had no adverse effects with it. She also says that she is not allergic using Neosporin.  she is to apply Neosporin to each lesion on the scalp and  on the posterior neck. She is to take the Cipro as directed. She is to have her husband inspect each site on a daily basis. If the sites worsen or do not resolve with completion of the antibiotic then followup.  Reports that she has no prior history of any abscesses or known MRSA. All lesions are dry and no lesions have any drainage to send for culture - ciprofloxacin (CIPRO) 500 MG tablet; Take 1 tablet (500 mg total) by mouth 2 (two) times daily.  Dispense: 20 tablet; Refill: 0   Signed, [address] [place], [place], [facility] 08/03/2013 4:39 PM

## 2013-08-15 ENCOUNTER — Ambulatory Visit: Payer: BC Managed Care – PPO

## 2013-10-06 ENCOUNTER — Other Ambulatory Visit: Payer: Self-pay | Admitting: Family Medicine

## 2013-10-07 NOTE — Telephone Encounter (Signed)
Refill appropriate and filled per protocol. 

## 2013-11-07 ENCOUNTER — Other Ambulatory Visit: Payer: Self-pay | Admitting: Family Medicine

## 2013-12-06 ENCOUNTER — Encounter: Payer: Self-pay | Admitting: Family Medicine

## 2013-12-06 ENCOUNTER — Telehealth: Payer: Self-pay | Admitting: Family Medicine

## 2013-12-06 DIAGNOSIS — I10 Essential (primary) hypertension: Secondary | ICD-10-CM

## 2013-12-06 MED ORDER — BENAZEPRIL HCL 20 MG PO TABS: 20.0000 mg | ORAL_TABLET | Freq: Every day | ORAL | Status: DC

## 2013-12-06 NOTE — Telephone Encounter (Signed)
Medication refill for one time only.  Patient needs to be seen.  Letter sent for patient to call and schedule 

## 2014-02-08 ENCOUNTER — Telehealth: Payer: Self-pay | Admitting: *Deleted

## 2014-02-08 MED ORDER — LEVOTHYROXINE SODIUM 137 MCG PO TABS: 137.0000 ug | ORAL_TABLET | Freq: Every day | ORAL | Status: DC

## 2014-02-08 NOTE — Telephone Encounter (Signed)
Med refilled.

## 2014-04-12 ENCOUNTER — Other Ambulatory Visit: Payer: Self-pay | Admitting: Family Medicine

## 2014-04-12 ENCOUNTER — Ambulatory Visit: Payer: Self-pay | Admitting: Physician Assistant

## 2014-04-12 ENCOUNTER — Other Ambulatory Visit: Payer: Self-pay

## 2014-04-12 DIAGNOSIS — Z0000 Encounter for general adult medical examination without abnormal findings: Secondary | ICD-10-CM

## 2014-04-12 DIAGNOSIS — Z79899 Other long term (current) drug therapy: Secondary | ICD-10-CM

## 2014-04-19 ENCOUNTER — Ambulatory Visit: Payer: Self-pay | Admitting: Physician Assistant

## 2014-05-26 ENCOUNTER — Other Ambulatory Visit: Payer: BLUE CROSS/BLUE SHIELD

## 2014-05-26 ENCOUNTER — Other Ambulatory Visit: Payer: Self-pay | Admitting: Physician Assistant

## 2014-05-26 DIAGNOSIS — Z0000 Encounter for general adult medical examination without abnormal findings: Secondary | ICD-10-CM

## 2014-05-26 DIAGNOSIS — Z79899 Other long term (current) drug therapy: Secondary | ICD-10-CM

## 2014-05-26 LAB — COMPLETE METABOLIC PANEL WITH GFR
ALT: 42 U/L — ABNORMAL HIGH (ref 0–35)
AST: 27 U/L (ref 0–37)
Albumin: 4.1 g/dL (ref 3.5–5.2)
Alkaline Phosphatase: 96 U/L (ref 39–117)
BUN: 17 mg/dL (ref 6–23)
CO2: 26 mEq/L (ref 19–32)
Calcium: 9.7 mg/dL (ref 8.4–10.5)
Chloride: 108 mEq/L (ref 96–112)
Creat: 0.8 mg/dL (ref 0.50–1.10)
GFR, Est African American: >89 mL/min
GFR, Est Non African American: 84 mL/min
Glucose, Bld: 110 mg/dL — ABNORMAL HIGH (ref 70–99)
Potassium: 4.5 mEq/L (ref 3.5–5.3)
Sodium: 141 mEq/L (ref 135–145)
Total Bilirubin: 0.4 mg/dL (ref 0.2–1.2)
Total Protein: 6.4 g/dL (ref 6.0–8.3)

## 2014-05-26 LAB — CBC WITH DIFFERENTIAL/PLATELET
Basophils Absolute: 0.1 10*3/uL (ref 0.0–0.1)
Basophils Relative: 1 % (ref 0–1)
Eosinophils Absolute: 0.6 10*3/uL (ref 0.0–0.7)
Eosinophils Relative: 7 % — ABNORMAL HIGH (ref 0–5)
HCT: 42.4 % (ref 36.0–46.0)
Hemoglobin: 13.6 g/dL (ref 12.0–15.0)
Lymphocytes Relative: 27 % (ref 12–46)
Lymphs Abs: 2.2 10*3/uL (ref 0.7–4.0)
MCH: 28.4 pg (ref 26.0–34.0)
MCHC: 32.1 g/dL (ref 30.0–36.0)
MCV: 88.5 fL (ref 78.0–100.0)
MPV: 9.5 fL (ref 8.6–12.4)
Monocytes Absolute: 0.4 10*3/uL (ref 0.1–1.0)
Monocytes Relative: 5 % (ref 3–12)
Neutro Abs: 4.9 10*3/uL (ref 1.7–7.7)
Neutrophils Relative %: 60 % (ref 43–77)
Platelets: 373 10*3/uL (ref 150–400)
RBC: 4.79 MIL/uL (ref 3.87–5.11)
RDW: 13.9 % (ref 11.5–15.5)
WBC: 8.1 10*3/uL (ref 4.0–10.5)

## 2014-05-26 LAB — LIPID PANEL
Cholesterol: 182 mg/dL (ref 0–200)
HDL: 45 mg/dL — ABNORMAL LOW (ref >=46)
LDL Cholesterol: 113 mg/dL — ABNORMAL HIGH (ref 0–99)
Total CHOL/HDL Ratio: 4 Ratio
Triglycerides: 120 mg/dL (ref <150)
VLDL: 24 mg/dL (ref 0–40)

## 2014-05-29 LAB — HEMOGLOBIN A1C
Hgb A1c MFr Bld: 5.9 % — ABNORMAL HIGH (ref <5.7)
Mean Plasma Glucose: 123 mg/dL — ABNORMAL HIGH (ref <117)

## 2014-05-31 ENCOUNTER — Encounter: Payer: Self-pay | Admitting: Physician Assistant

## 2014-05-31 ENCOUNTER — Ambulatory Visit (INDEPENDENT_AMBULATORY_CARE_PROVIDER_SITE_OTHER): Payer: BLUE CROSS/BLUE SHIELD | Admitting: Physician Assistant

## 2014-05-31 VITALS — BP 130/86 | HR 68 | Temp 99.0°F | Resp 18 | Ht 67.0 in | Wt 243.0 lb

## 2014-05-31 DIAGNOSIS — Z1212 Encounter for screening for malignant neoplasm of rectum: Secondary | ICD-10-CM | POA: Diagnosis not present

## 2014-05-31 DIAGNOSIS — E039 Hypothyroidism, unspecified: Secondary | ICD-10-CM

## 2014-05-31 DIAGNOSIS — Z0000 Encounter for general adult medical examination without abnormal findings: Secondary | ICD-10-CM | POA: Diagnosis not present

## 2014-05-31 DIAGNOSIS — I10 Essential (primary) hypertension: Secondary | ICD-10-CM | POA: Diagnosis not present

## 2014-05-31 DIAGNOSIS — Z1211 Encounter for screening for malignant neoplasm of colon: Secondary | ICD-10-CM | POA: Diagnosis not present

## 2014-05-31 MED ORDER — HYDROCHLOROTHIAZIDE 25 MG PO TABS: 25.0000 mg | ORAL_TABLET | Freq: Every day | ORAL | Status: DC

## 2014-05-31 MED ORDER — LEVOTHYROXINE SODIUM 137 MCG PO TABS
137.0000 ug | ORAL_TABLET | Freq: Every day | ORAL | Status: DC
Start: 2014-05-31 — End: 2018-02-19

## 2014-05-31 MED ORDER — BENAZEPRIL HCL 20 MG PO TABS: 20.0000 mg | ORAL_TABLET | Freq: Every day | ORAL | Status: DC

## 2014-05-31 NOTE — Progress Notes (Signed)
Patient ID: Leah Powers MRN: [id number], DOB: 1960/04/06, 54 y.o. Date of Encounter: 05/31/2014,   Chief Complaint: Physical (CPE)  HPI: 54 y.o. y/o female  here for CPE.   She has no specific complaints today. She continues to follow up with psychiatry routinely and sees Dr. Dustin Flock at [facility]. He recently faxed [place] a copy of labs that he did 04/25/14 with  creatinine normal at 0.65  TSH normal at 2.496  lithium normal at 1.00  Says that she has been out of her benazepril since January.   Review of Systems: Consitutional: No fever, chills, fatigue, night sweats, lymphadenopathy. No significant/unexplained weight changes. Eyes: No visual changes, eye redness, or discharge. ENT/Mouth: No ear pain, sore throat, nasal drainage, or sinus pain. Cardiovascular: No chest pressure,heaviness, tightness or squeezing, even with exertion. No increased shortness of breath or dyspnea on exertion.No palpitations, edema, orthopnea, PND. Respiratory: No cough, hemoptysis, SOB, or wheezing. Gastrointestinal: No anorexia, dysphagia, reflux, pain, nausea, vomiting, hematemesis, diarrhea, constipation, BRBPR, or melena. Breast: No mass, nodules, bulging, or retraction. No skin changes or inflammation. No nipple discharge. No lymphadenopathy. Genitourinary: No dysuria, hematuria, incontinence, vaginal discharge, pruritis, burning, abnormal bleeding, or pain. Musculoskeletal: No decreased ROM, No joint pain or swelling. No significant pain in neck, back, or extremities. Skin: No rash, pruritis, or concerning lesions. Neurological: No headache, dizziness, syncope, seizures, tremors, memory loss, coordination problems, or paresthesias. Psychological: No anxiety, depression, hallucinations, SI/HI. Endocrine: No polydipsia, polyphagia, polyuria, or known diabetes.No increased fatigue. No palpitations/rapid heart rate. No significant/unexplained weight change. All other systems were  reviewed and are otherwise negative.  Past Medical History  Diagnosis Date  . Hypertension   . Bipolar 1 disorder   . Hypothyroid      History reviewed. No pertinent past surgical history.  Home Meds:  Outpatient Prescriptions Prior to Visit  Medication Sig Dispense Refill  . cetirizine (ZYRTEC) 5 MG tablet Take 5 mg by mouth daily.    . hydroxypropyl methylcellulose (ISOPTO TEARS) 2.5 % ophthalmic solution Place 2 drops into both eyes 2 (two) times daily as needed. For dry eyes    . lithium carbonate 300 MG capsule Take 1,200 mg by mouth every evening.    . naproxen sodium (ANAPROX) 220 MG tablet Take 2 tablets (440 mg total) by mouth every 12 (twelve) hours as needed (pain). 20 tablet 0  . benazepril (LOTENSIN) 20 MG tablet Take 1 tablet (20 mg total) by mouth daily. 30 tablet 0  . hydrochlorothiazide (HYDRODIURIL) 25 MG tablet TAKE ONE TABLET BY MOUTH ONCE DAILY. 90 tablet 3  . levothyroxine (SYNTHROID, LEVOTHROID) 137 MCG tablet Take 1 tablet (137 mcg total) by mouth daily. 30 tablet 3  . ciprofloxacin (CIPRO) 500 MG tablet Take 1 tablet (500 mg total) by mouth 2 (two) times daily. 20 tablet 0   No facility-administered medications prior to visit.    Allergies:  Allergies  Allergen Reactions  . Corn-Containing Products   . Dolobid [Diflunisal]   . Doxycycline   . Erythromycin   . Eucalyptus Oil   . Haldol [Haloperidol Decanoate]   . Keflex [Cephalexin]   . Macrodantin   . Molds & Smuts   . Penicillins   . Septra [Bactrim]   . Tetracyclines & Related   . Tofranil-Pm   . Vicodin [Hydrocodone-Acetaminophen]     History   Social History  . Marital Status: Divorced    Spouse Name: N/A  . Number of Children: N/A  . Years of  Education: N/A   Occupational History  . Not on file.   Social History Main Topics  . Smoking status: Never Smoker   . Smokeless tobacco: Not on file  . Alcohol Use: No  . Drug Use: No  . Sexual Activity: Not on file   Other Topics  Concern  . Not on file   Social History Narrative    History reviewed. No pertinent family history.  Physical Exam: Blood pressure 130/86, pulse 68, temperature 99 F (37.2 C), temperature source Oral, resp. rate 18, height 5\' 7"  (1.702 m), weight 243 lb (110.224 kg), last menstrual period 07/30/2013., Body mass index is 38.05 kg/(m^2). General: Obese WF. Appears in no acute distress. HEENT: Normocephalic, atraumatic. Conjunctiva pink, sclera non-icteric. Pupils 2 mm constricting to 1 mm, round, regular, and equally reactive to light and accomodation. EOMI. Internal auditory canal clear. TMs with good cone of light and without pathology. Nasal mucosa pink. Nares are without discharge. No sinus tenderness. Oral mucosa pink.  Pharynx without exudate.   Neck: Supple. Trachea midline. No thyromegaly. Full ROM. No lymphadenopathy.No Carotid Bruits. Lungs: Clear to auscultation bilaterally without wheezes, rales, or rhonchi. Breathing is of normal effort and unlabored. Cardiovascular: RRR with S1 S2. No murmurs, rubs, or gallops. Distal pulses 2+ symmetrically. No carotid or abdominal bruits. Breast: Symmetrical. No masses. Nipples without discharge. Abdomen: Soft, non-tender, non-distended with normoactive bowel sounds. No hepatosplenomegaly or masses. No rebound/guarding. No CVA tenderness. No hernias.  Genitourinary:  External genitalia without lesions. Vaginal mucosa pink.No discharge present. Cervix pink and without discharge. No cervical tenderness.Normal uterus size. No adnexal mass or tenderness.   Musculoskeletal: Full range of motion and 5/5 strength throughout. Without swelling. Extremities without clubbing, cyanosis, or edema. Skin: Warm and moist without erythema, ecchymosis, wounds, or rash. Neuro: A+Ox3. CN II-XII grossly intact. Moves all extremities spontaneously. Full sensation throughout. Normal gait.  Psych:  Responds to questions appropriately with a normal affect.    Assessment/Plan:  54 y.o. y/o female here for CPE  1. Visit for preventive health examination  A. Screening Labs: Have a copy of labs checked at psychiatry 04/25/14 with normal creatinine, normal TSH, normal lithium level. We'll have her restart her blood pressure medication and then have follow-up office visit here in 2 weeks and recheck blood work at that time. BMET. She had full panel of fasting labs here 03/26/12. At that time CMET was normal. Lipid panel was very good with triglyceride 71 HDL 44 LDL 110.  B. Pap: She had a Pap smear performed here with me 04/03/2011. Did cytology and HPV co-testing. Cytology was negative and HPV was not detected. Therefore can wait 5 years for repeat Pap smear. Repeat due 04/02/2016  C. Screening Mammogram: Pt states that it is been almost 2 years since her last mammogram. She is agreeable to call to schedule follow-up herself. I wrote this on her AVS to remind her to go home and call to schedule this.  D. DEXA/BMD:  Can wait until closer to age 83 to start screening for osteoporosis.  E. Colorectal Cancer Screening: Pt states that she has not had a screening colonoscopy. She is agreeable to do so. She is agreeable for me to go ahead and place referral to GI. - Ambulatory referral to Gastroenterology   F. Immunizations:  Influenza:------------------------N/A Tetanus:-----------------------Tdap given 09/03/2007 Pneumococcal:------------------ she has no indication to need a pneumonia vaccine until age 27. Zostavax:---------------------- not indicated until age 68.    2. Screening for colorectal cancer - Ambulatory referral  to Gastroenterology  3. Essential hypertension She has been out of her benazepril since January. She will restart this and continue her HCTZ. She is to schedule follow-up office visit here in 2 weeks to recheck blood pressure and BMET on meds. Recent labs at psychiatry did not include BMET. - hydrochlorothiazide  (HYDRODIURIL) 25 MG tablet; Take 1 tablet (25 mg total) by mouth daily.  Dispense: 30 tablet; Refill: 0 - benazepril (LOTENSIN) 20 MG tablet; Take 1 tablet (20 mg total) by mouth daily.  Dispense: 30 tablet; Refill: 0  4. Hypothyroidism, unspecified hypothyroidism  Psychiatry did send a copy of recent labs including TSH which was normal at 2.496 on 04/25/14. --Continue current dose of thyroid. - levothyroxine (SYNTHROID, LEVOTHROID) 137 MCG tablet; Take 1 tablet (137 mcg total) by mouth daily.  Dispense: 30 tablet; Refill: 5   F/U office visit 2 weeks.   Marin Olp [place], [place], [facility] 05/31/2014 2:56 PM

## 2014-06-12 ENCOUNTER — Encounter: Payer: Self-pay | Admitting: Physician Assistant

## 2014-06-18 ENCOUNTER — Encounter: Payer: Self-pay | Admitting: Family Medicine

## 2014-06-19 ENCOUNTER — Encounter: Payer: Self-pay | Admitting: Physician Assistant

## 2014-06-19 ENCOUNTER — Ambulatory Visit (INDEPENDENT_AMBULATORY_CARE_PROVIDER_SITE_OTHER): Payer: BLUE CROSS/BLUE SHIELD | Admitting: Physician Assistant

## 2014-06-19 ENCOUNTER — Other Ambulatory Visit: Payer: Self-pay

## 2014-06-19 VITALS — BP 126/80 | HR 72 | Temp 98.7°F | Resp 18 | Wt 241.0 lb

## 2014-06-19 DIAGNOSIS — I10 Essential (primary) hypertension: Secondary | ICD-10-CM | POA: Diagnosis not present

## 2014-06-19 DIAGNOSIS — E039 Hypothyroidism, unspecified: Secondary | ICD-10-CM

## 2014-06-19 DIAGNOSIS — Z1231 Encounter for screening mammogram for malignant neoplasm of breast: Secondary | ICD-10-CM

## 2014-06-19 MED ORDER — HYDROCHLOROTHIAZIDE 25 MG PO TABS: 25.0000 mg | ORAL_TABLET | Freq: Every day | ORAL | Status: DC

## 2014-06-19 MED ORDER — BENAZEPRIL HCL 20 MG PO TABS: 20.0000 mg | ORAL_TABLET | Freq: Every day | ORAL | Status: DC

## 2014-06-19 NOTE — Progress Notes (Signed)
Patient ID: Leah Powers MRN: [id number], DOB: 01-Sep-1960, 54 y.o. Date of Encounter: 06/19/2014,   Chief Complaint: F/U hypertension  HPI: 54 y.o. y/o female  here for two-week follow-up office visit follow-up hypertension.   She recently had visit with me for complete physical exam on 05/31/14. The following is copied from that note:   She has no specific complaints today. She continues to follow up with psychiatry routinely and sees Dr. Dustin Flock at [facility]. He recently faxed [place] a copy of labs that he did 04/25/14 with  creatinine normal at 0.65  TSH normal at 2.496  lithium normal at 1.00  Says that she has been out of her benazepril since January.  ENTERED 06/19/14:  At that visit on 05/31/14, I restarted that the benazepril. Continued the HCTZ. Had her come back for follow-up today to recheck blood pressure and BMET on medications. Today she states that she is taking both medications daily as directed. No adverse effects.   Review of Systems: Consitutional: No fever, chills, fatigue, night sweats, lymphadenopathy. No significant/unexplained weight changes. Eyes: No visual changes, eye redness, or discharge. ENT/Mouth: No ear pain, sore throat, nasal drainage, or sinus pain. Cardiovascular: No chest pressure,heaviness, tightness or squeezing, even with exertion. No increased shortness of breath or dyspnea on exertion.No palpitations, edema, orthopnea, PND. Respiratory: No cough, hemoptysis, SOB, or wheezing. Gastrointestinal: No anorexia, dysphagia, reflux, pain, nausea, vomiting, hematemesis, diarrhea, constipation, BRBPR, or melena. Breast: No mass, nodules, bulging, or retraction. No skin changes or inflammation. No nipple discharge. No lymphadenopathy. Genitourinary: No dysuria, hematuria, incontinence, vaginal discharge, pruritis, burning, abnormal bleeding, or pain. Musculoskeletal: No decreased ROM, No joint pain or swelling. No significant pain in  neck, back, or extremities. Skin: No rash, pruritis, or concerning lesions. Neurological: No headache, dizziness, syncope, seizures, tremors, memory loss, coordination problems, or paresthesias. Psychological: No anxiety, depression, hallucinations, SI/HI. Endocrine: No polydipsia, polyphagia, polyuria, or known diabetes.No increased fatigue. No palpitations/rapid heart rate. No significant/unexplained weight change. All other systems were reviewed and are otherwise negative.  Past Medical History  Diagnosis Date  . Hypertension   . Bipolar 1 disorder   . Hypothyroid      History reviewed. No pertinent past surgical history.  Home Meds:  Outpatient Prescriptions Prior to Visit  Medication Sig Dispense Refill  . cetirizine (ZYRTEC) 5 MG tablet Take 5 mg by mouth daily.    . hydroxypropyl methylcellulose (ISOPTO TEARS) 2.5 % ophthalmic solution Place 2 drops into both eyes 2 (two) times daily as needed. For dry eyes    . levothyroxine (SYNTHROID, LEVOTHROID) 137 MCG tablet Take 1 tablet (137 mcg total) by mouth daily. 30 tablet 5  . lithium carbonate 300 MG capsule Take 1,200 mg by mouth every evening.    . naproxen sodium (ANAPROX) 220 MG tablet Take 2 tablets (440 mg total) by mouth every 12 (twelve) hours as needed (pain). 20 tablet 0  . benazepril (LOTENSIN) 20 MG tablet Take 1 tablet (20 mg total) by mouth daily. 30 tablet 0  . hydrochlorothiazide (HYDRODIURIL) 25 MG tablet Take 1 tablet (25 mg total) by mouth daily. 30 tablet 0   No facility-administered medications prior to visit.    Allergies:  Allergies  Allergen Reactions  . Corn-Containing Products   . Dolobid [Diflunisal]   . Doxycycline   . Erythromycin   . Eucalyptus Oil   . Haldol [Haloperidol Decanoate]   . Keflex [Cephalexin]   . Macrodantin   . Molds & Smuts   .  Penicillins   . Septra [Bactrim]   . Tetracyclines & Related   . Tofranil-Pm   . Vicodin [Hydrocodone-Acetaminophen]     History   Social  History  . Marital Status: Divorced    Spouse Name: N/A  . Number of Children: N/A  . Years of Education: N/A   Occupational History  . Not on file.   Social History Main Topics  . Smoking status: Never Smoker   . Smokeless tobacco: Not on file  . Alcohol Use: No  . Drug Use: No  . Sexual Activity: Not on file   Other Topics Concern  . Not on file   Social History Narrative    History reviewed. No pertinent family history.  Physical Exam: Blood pressure 126/80, pulse 72, temperature 98.7 F (37.1 C), temperature source Oral, resp. rate 18, weight 241 lb (109.317 kg), last menstrual period 07/30/2013., Body mass index is 37.74 kg/(m^2). General: Obese WF. Appears in no acute distress. HEENT: Normocephalic, atraumatic. Conjunctiva pink, sclera non-icteric. Pupils 2 mm constricting to 1 mm, round, regular, and equally reactive to light and accomodation. EOMI. Internal auditory canal clear. TMs with good cone of light and without pathology. Nasal mucosa pink. Nares are without discharge. No sinus tenderness. Oral mucosa pink.  Pharynx without exudate.   Neck: Supple. Trachea midline. No thyromegaly. Full ROM. No lymphadenopathy.No Carotid Bruits. Lungs: Clear to auscultation bilaterally without wheezes, rales, or rhonchi. Breathing is of normal effort and unlabored. Cardiovascular: RRR with S1 S2. No murmurs, rubs, or gallops. Distal pulses 2+ symmetrically. No carotid or abdominal bruits. Breast: Symmetrical. No masses. Nipples without discharge. Abdomen: Soft, non-tender, non-distended with normoactive bowel sounds. No hepatosplenomegaly or masses. No rebound/guarding. No CVA tenderness. No hernias.  Genitourinary:  External genitalia without lesions. Vaginal mucosa pink.No discharge present. Cervix pink and without discharge. No cervical tenderness.Normal uterus size. No adnexal mass or tenderness.   Musculoskeletal: Full range of motion and 5/5 strength throughout. Without  swelling. Extremities without clubbing, cyanosis, or edema. Skin: Warm and moist without erythema, ecchymosis, wounds, or rash. Neuro: A+Ox3. CN II-XII grossly intact. Moves all extremities spontaneously. Full sensation throughout. Normal gait.  Psych:  Responds to questions appropriately with a normal affect.   Assessment/Plan:  54 y.o. y/o female here for  1. Essential hypertension -BMET Blood Pressure is well controlled at goal. Will check labs. If lab is normal then will continue current medications of benazepril 20mg  and HCTZ 25 mg daily. Will schedule follow-up office visit in 6 months. Follow-up sooner if needed.  2. Hypothyroidism, unspecified hypothyroidism  Psychiatry did send a copy of recent labs including TSH which was normal at 2.496 on 04/25/14. --Continue current dose of thyroid. Pt reports that psychiatry actually prescribes her thyroid medication.   Visit for preventive health examination--THE FOLOWING IS COPIED FROM HER CPE NOTE 05/31/2014:  A. Screening Labs: Have a copy of labs checked at psychiatry 04/25/14 with normal creatinine, normal TSH, normal lithium level. We'll have her restart her blood pressure medication and then have follow-up office visit here in 2 weeks and recheck blood work at that time. BMET. She had full panel of fasting labs here 03/26/12. At that time CMET was normal. Lipid panel was very good with triglyceride 71 HDL 44 LDL 110.  B. Pap: She had a Pap smear performed here with me 04/03/2011. Did cytology and HPV co-testing. Cytology was negative and HPV was not detected. Therefore can wait 5 years for repeat Pap smear. Repeat due 04/02/2016  C. Screening  Mammogram: Pt states that it is been almost 2 years since her last mammogram. She is agreeable to call to schedule follow-up herself. I wrote this on her AVS to remind her to go home and call to schedule this.  D. DEXA/BMD:  Can wait until closer to age 66 to start screening for osteoporosis.  E.  Colorectal Cancer Screening: Pt states that she has not had a screening colonoscopy. She is agreeable to do so. She is agreeable for me to go ahead and place referral to GI. - Ambulatory referral to Gastroenterology   F. Immunizations:  Influenza:------------------------N/A Tetanus:-----------------------Tdap given 09/03/2007 Pneumococcal:------------------ she has no indication to need a pneumonia vaccine until age 5. Zostavax:---------------------- not indicated until age 65.    2. Screening for colorectal cancer - Ambulatory referral to Gastroenterology   F/U office visit 6 months.Sooner if needed.   Marin Olp Adams, [place], [facility] 06/19/2014 2:55 PM

## 2014-06-20 LAB — BASIC METABOLIC PANEL WITH GFR
BUN: 18 mg/dL (ref 6–23)
CO2: 27 mEq/L (ref 19–32)
Calcium: 10.5 mg/dL (ref 8.4–10.5)
Chloride: 103 mEq/L (ref 96–112)
Creat: 0.81 mg/dL (ref 0.50–1.10)
GFR, Est African American: >89 mL/min
GFR, Est Non African American: 83 mL/min
Glucose, Bld: 95 mg/dL (ref 70–99)
Potassium: 4.6 mEq/L (ref 3.5–5.3)
Sodium: 140 mEq/L (ref 135–145)

## 2014-06-22 ENCOUNTER — Ambulatory Visit: Payer: Self-pay

## 2014-06-27 ENCOUNTER — Ambulatory Visit
Admission: RE | Admit: 2014-06-27 | Discharge: 2014-06-27 | Disposition: A | Discharge status: Home / Self Care | Payer: BLUE CROSS/BLUE SHIELD | Source: Ambulatory Visit

## 2014-06-27 DIAGNOSIS — Z1231 Encounter for screening mammogram for malignant neoplasm of breast: Secondary | ICD-10-CM

## 2014-07-11 ENCOUNTER — Other Ambulatory Visit: Payer: BLUE CROSS/BLUE SHIELD | Admitting: Family Medicine

## 2014-11-17 ENCOUNTER — Encounter: Payer: Self-pay | Admitting: Physician Assistant

## 2014-11-21 ENCOUNTER — Ambulatory Visit (INDEPENDENT_AMBULATORY_CARE_PROVIDER_SITE_OTHER): Payer: BLUE CROSS/BLUE SHIELD | Admitting: Family Medicine

## 2014-11-21 ENCOUNTER — Encounter: Payer: Self-pay | Admitting: Family Medicine

## 2014-11-21 VITALS — BP 118/70 | HR 76 | Temp 99.1°F | Resp 18 | Ht 66.0 in | Wt 240.0 lb

## 2014-11-21 DIAGNOSIS — Z205 Contact with and (suspected) exposure to viral hepatitis: Secondary | ICD-10-CM

## 2014-11-21 DIAGNOSIS — I10 Essential (primary) hypertension: Secondary | ICD-10-CM

## 2014-11-21 MED ORDER — BENAZEPRIL HCL 10 MG PO TABS: 10.0000 mg | ORAL_TABLET | Freq: Every day | ORAL | Status: DC

## 2014-11-21 NOTE — Progress Notes (Signed)
Subjective:    Patient ID: Leah Powers, female    DOB: October 10, 1960, 54 y.o.   MRN: [id number]  HPI  Recently, the patient's blood pressure has been dropping. She discovered this when she was having repeated episodes of feeling weak and lightheaded. She checked her blood pressure and found to be very low near 220 systolic. Since that time the patient has decreased her benazepril from 20 mg a day to 10 mg a day. Now her blood pressures range between 120 and 130/70-80 she is also requesting screening for hepatitis C. Previous in the past she has been told that she has hepatitis C when she gave blood.. Past Medical History  Diagnosis Date  . Hypertension   . Bipolar 1 disorder   . Hypothyroid    No past surgical history on file. Current Outpatient Prescriptions on File Prior to Visit  Medication Sig Dispense Refill  . benazepril (LOTENSIN) 20 MG tablet Take 1 tablet (20 mg total) by mouth daily. (Patient taking differently: Take 10 mg by mouth daily. ) 90 tablet 1  . cetirizine (ZYRTEC) 5 MG tablet Take 5 mg by mouth daily.    . hydrochlorothiazide (HYDRODIURIL) 25 MG tablet Take 1 tablet (25 mg total) by mouth daily. 90 tablet 1  . hydroxypropyl methylcellulose (ISOPTO TEARS) 2.5 % ophthalmic solution Place 2 drops into both eyes 2 (two) times daily as needed. For dry eyes    . levothyroxine (SYNTHROID, LEVOTHROID) 137 MCG tablet Take 1 tablet (137 mcg total) by mouth daily. 30 tablet 5  . lithium carbonate 300 MG capsule Take 1,200 mg by mouth every evening.    . naproxen sodium (ANAPROX) 220 MG tablet Take 2 tablets (440 mg total) by mouth every 12 (twelve) hours as needed (pain). 20 tablet 0   No current facility-administered medications on file prior to visit.   Allergies  Allergen Reactions  . Corn-Containing Products   . Dolobid [Diflunisal]   . Doxycycline   . Erythromycin   . Eucalyptus Oil   . Haldol [Haloperidol Decanoate]   . Keflex [Cephalexin]   . Macrodantin   .  Molds & Smuts   . Penicillins   . Septra [Bactrim]   . Tetracyclines & Related   . Tofranil-Pm   . Vicodin [Hydrocodone-Acetaminophen]    Social History   Social History  . Marital Status: Divorced    Spouse Name: N/A  . Number of Children: N/A  . Years of Education: N/A   Occupational History  . Not on file.   Social History Main Topics  . Smoking status: Never Smoker   . Smokeless tobacco: Not on file  . Alcohol Use: No  . Drug Use: No  . Sexual Activity: Not on file   Other Topics Concern  . Not on file   Social History Narrative     Review of Systems  All other systems reviewed and are negative.      Objective:   Physical Exam  Cardiovascular: Normal rate, regular rhythm and normal heart sounds.   No murmur heard. Pulmonary/Chest: Effort normal and breath sounds normal. No respiratory distress. She has no wheezes. She has no rales.  Abdominal: Soft. Bowel sounds are normal. She exhibits no distension. There is no tenderness. There is no rebound and no guarding.  Vitals reviewed.         Assessment & Plan:  Benign essential HTN - Plan: benazepril (LOTENSIN) 10 MG tablet  Exposure to hepatitis C - Plan: Hepatitis panel, acute,  COMPLETE METABOLIC PANEL WITH GFR  Blood pressure is excellent. Decrease benazepril to 10 mg by mouth daily. Given the patient's reported history of possible exposure to hepatitis C, I will check acute hepatitis panel to see if she has antibodies to hepatitis C virus. If positive, we'll need to check for viral load and genotype.

## 2014-11-22 LAB — COMPLETE METABOLIC PANEL WITH GFR
ALT: 29 U/L (ref 6–29)
AST: 22 U/L (ref 10–35)
Albumin: 4.4 g/dL (ref 3.6–5.1)
Alkaline Phosphatase: 87 U/L (ref 33–130)
BUN: 16 mg/dL (ref 7–25)
CO2: 26 mmol/L (ref 20–31)
Calcium: 10.5 mg/dL — ABNORMAL HIGH (ref 8.6–10.4)
Chloride: 105 mmol/L (ref 98–110)
Creat: 0.8 mg/dL (ref 0.50–1.05)
GFR, Est African American: >89 mL/min (ref >=60)
GFR, Est Non African American: 84 mL/min (ref >=60)
Glucose, Bld: 96 mg/dL (ref 70–99)
Potassium: 4.1 mmol/L (ref 3.5–5.3)
Sodium: 141 mmol/L (ref 135–146)
Total Bilirubin: 0.4 mg/dL (ref 0.2–1.2)
Total Protein: 6.7 g/dL (ref 6.1–8.1)

## 2014-11-22 LAB — HEPATITIS PANEL, ACUTE
HCV Ab: NEGATIVE
Hep A IgM: NONREACTIVE
Hep B C IgM: NONREACTIVE
Hepatitis B Surface Ag: NEGATIVE

## 2014-11-23 ENCOUNTER — Encounter: Payer: Self-pay | Admitting: Family Medicine

## 2014-12-25 ENCOUNTER — Encounter: Payer: Self-pay | Admitting: Family Medicine

## 2014-12-27 ENCOUNTER — Encounter: Payer: Self-pay | Admitting: Physician Assistant

## 2015-02-25 ENCOUNTER — Other Ambulatory Visit: Payer: Self-pay | Admitting: Physician Assistant

## 2015-02-26 NOTE — Telephone Encounter (Signed)
Medication refilled per protocol. 

## 2015-06-01 ENCOUNTER — Other Ambulatory Visit: Payer: Self-pay | Admitting: Physician Assistant

## 2015-06-01 NOTE — Telephone Encounter (Signed)
Medication refilled per protocol. 

## 2015-07-11 ENCOUNTER — Encounter: Payer: BLUE CROSS/BLUE SHIELD | Admitting: Podiatry

## 2015-07-11 NOTE — Patient Instructions (Signed)

## 2015-07-26 ENCOUNTER — Ambulatory Visit (INDEPENDENT_AMBULATORY_CARE_PROVIDER_SITE_OTHER): Payer: BLUE CROSS/BLUE SHIELD | Admitting: Podiatry

## 2015-07-26 ENCOUNTER — Encounter: Payer: Self-pay | Admitting: Podiatry

## 2015-07-26 VITALS — BP 124/71 | HR 67 | Resp 16

## 2015-07-26 DIAGNOSIS — L600 Ingrowing nail: Secondary | ICD-10-CM | POA: Diagnosis not present

## 2015-07-26 NOTE — Patient Instructions (Signed)

## 2015-07-26 NOTE — Progress Notes (Signed)
Subjective:     Patient ID: Leah Powers, female   DOB: 1960/06/03, 55 y.o.   MRN: [id number]  HPI patient presents with tender ingrown toenail deformity of the hallux medial border bilateral. Does not remember specific injury but it's been sore and hard to wear shoe gear with comfortably. Patient has tried to soak and tried to trim herself   Review of Systems  All other systems reviewed and are negative.      Objective:   Physical Exam  Constitutional: She is oriented to person, place, and time.  Cardiovascular: Intact distal pulses.   Musculoskeletal: Normal range of motion.  Neurological: She is oriented to person, place, and time.  Skin: Skin is warm.  Nursing note and vitals reviewed.  neurovascular status intact muscle strength adequate range of motion within normal limits with patient found to have thickened hallux nails bilateral with incurvation of the medial border bilateral that are painful when palpated. The entire nail bed has been traumatized the medial borders are ingrown and sore and patient's noted to have good digital perfusion and is well oriented 3     Assessment:     Probable trauma to the hallux nailbeds bilateral with incurvated medial corners of the hallux bilateral    Plan:     H&P and conditions reviewed with patient. I've recommended removal of the medial corners and cleaning up of the entire nail bed and explained procedure to patient. Patient wants procedures and today I infiltrated each hallux 60 mg like Marcaine mixture remove the medial borders exposed matrix and applied phenol 3 applications 30 seconds and debris did the dorsal surface to clean it up. Patient will be seen back as needed for recheck

## 2015-07-26 NOTE — Progress Notes (Signed)
   Subjective:    Patient ID: Leah Powers, female    DOB: 1961-02-11, 55 y.o.   MRN: [id number]  HPI  Chief Complaint  Patient presents with  . Toe Pain    1st toe bilateral - tender for few weeks, nail bed is bruised, doesn't remember an injury, corners of nail are tender now, other nails are thickened and the skin is callused at the tip.       Review of Systems  HENT: Positive for hearing loss.   Cardiovascular: Positive for leg swelling.  All other systems reviewed and are negative.      Objective:   Physical Exam        Assessment & Plan:

## 2015-07-30 DIAGNOSIS — L280 Lichen simplex chronicus: Secondary | ICD-10-CM | POA: Insufficient documentation

## 2015-08-03 ENCOUNTER — Telehealth: Payer: Self-pay | Admitting: *Deleted

## 2015-08-03 NOTE — Telephone Encounter (Signed)
Called patient at [phone] (Cell #) to check to see how they were doing from their ingrown toenail procedure that was performed on Thursday, Jul 26, 2015. Pt stated, "Not having any pain now; soaking toe with relief". I told patient if they have any type of discomfort to make sure they take Tylenol or ibuprofen. She stated she understood.

## 2015-08-15 NOTE — Progress Notes (Signed)
This encounter was created in error - please disregard.  This encounter was created in error - please disregard.

## 2015-10-29 ENCOUNTER — Other Ambulatory Visit: Payer: Self-pay | Admitting: Family Medicine

## 2015-10-29 DIAGNOSIS — I10 Essential (primary) hypertension: Secondary | ICD-10-CM

## 2015-10-29 NOTE — Telephone Encounter (Signed)
Refill appropriate and filled per protocol. 

## 2015-11-07 ENCOUNTER — Ambulatory Visit: Payer: BLUE CROSS/BLUE SHIELD | Admitting: Podiatry

## 2016-02-13 ENCOUNTER — Ambulatory Visit (INDEPENDENT_AMBULATORY_CARE_PROVIDER_SITE_OTHER): Payer: BLUE CROSS/BLUE SHIELD | Admitting: Podiatry

## 2016-02-13 ENCOUNTER — Encounter: Payer: Self-pay | Admitting: Podiatry

## 2016-02-13 VITALS — BP 147/82 | HR 65 | Resp 15 | Ht 68.5 in | Wt 245.0 lb

## 2016-02-13 DIAGNOSIS — M779 Enthesopathy, unspecified: Secondary | ICD-10-CM

## 2016-02-13 DIAGNOSIS — M2040 Other hammer toe(s) (acquired), unspecified foot: Secondary | ICD-10-CM | POA: Diagnosis not present

## 2016-02-13 DIAGNOSIS — L84 Corns and callosities: Secondary | ICD-10-CM | POA: Diagnosis not present

## 2016-02-13 MED ORDER — TRIAMCINOLONE ACETONIDE 10 MG/ML IJ SUSP
10.0000 mg | Freq: Once | INTRAMUSCULAR | Status: AC
  Administered 2016-02-13: 10 mg

## 2016-02-14 ENCOUNTER — Telehealth: Payer: Self-pay | Admitting: Family Medicine

## 2016-02-14 NOTE — Progress Notes (Signed)
Subjective:     Patient ID: Leah Powers, female   DOB: 1960-06-17, 55 y.o.   MRN: [id number]  HPI patient presents with significant digital deformity and is also noted to have tendinitis-like symptoms that are occurring right foot. Patient has callus formation that can become painful at times and patient also has nail disease   Review of Systems  All other systems reviewed and are negative.      Objective:   Physical Exam  Constitutional: She is oriented to person, place, and time.  Musculoskeletal: Normal range of motion.  Neurological: She is oriented to person, place, and time.  Skin: Skin is warm.  Nursing note and vitals reviewed.  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 Careful sheath injection administered 3 mg Kenalog 5 mill grams Xylocaine after reviewing risk and debrided callus and scanned for custom orthotics to reduce plantar pressure. Reappoint when returned

## 2016-02-14 NOTE — Telephone Encounter (Signed)
Opened in error

## 2016-02-15 ENCOUNTER — Ambulatory Visit (INDEPENDENT_AMBULATORY_CARE_PROVIDER_SITE_OTHER): Payer: BLUE CROSS/BLUE SHIELD | Admitting: Physician Assistant

## 2016-02-15 ENCOUNTER — Encounter: Payer: Self-pay | Admitting: Physician Assistant

## 2016-02-15 VITALS — BP 140/70 | HR 83 | Temp 98.9°F | Resp 16 | Wt 244.0 lb

## 2016-02-15 DIAGNOSIS — J988 Other specified respiratory disorders: Secondary | ICD-10-CM | POA: Diagnosis not present

## 2016-02-15 DIAGNOSIS — B9689 Other specified bacterial agents as the cause of diseases classified elsewhere: Secondary | ICD-10-CM

## 2016-02-15 MED ORDER — AZITHROMYCIN 250 MG PO TABS: ORAL_TABLET | ORAL | 0 refills | Status: DC

## 2016-02-15 NOTE — Progress Notes (Signed)
Patient ID: Leah Powers MRN: [id number], DOB: 01-21-1961, 55 y.o. Date of Encounter: 02/15/2016, 3:57 PM    Chief Complaint:  Chief Complaint  Patient presents with  . congested  . Cough     HPI: 55 y.o. year old female says on Thanksgiving day (02/07/2016) she looked in the mirror and saw that she had drainage in her right eye and her right eye was getting red. On that Friday she called our on-call provider and they called her in some eyedrops. Her eyes have cleared up since then. However on that Saturday she developed a cough. She has continued with cough and also blowing mucus out of her nose since then. Has been going on 7 days now. She blows her nose she is getting thick yellow mucus out and when she coughs up phlegm it is thick yellow-green. No significant amount of sore throat. No fevers or chills.      Home Meds:   Outpatient Medications Prior to Visit  Medication Sig Dispense Refill  . ALPRAZolam (XANAX) 0.25 MG tablet     . benazepril (LOTENSIN) 10 MG tablet TAKE ONE TABLET BY MOUTH ONCE DAILY 90 tablet 3  . dextromethorphan-guaiFENesin (MUCINEX DM) 30-600 MG 12hr tablet Take 1 tablet by mouth 2 (two) times daily.    . hydrochlorothiazide (HYDRODIURIL) 25 MG tablet TAKE ONE TABLET BY MOUTH ONCE DAILY 90 tablet 1  . hydroxypropyl methylcellulose (ISOPTO TEARS) 2.5 % ophthalmic solution Place 2 drops into both eyes 2 (two) times daily as needed. For dry eyes    . levothyroxine (SYNTHROID, LEVOTHROID) 137 MCG tablet Take 1 tablet (137 mcg total) by mouth daily. 30 tablet 5  . lithium carbonate 300 MG capsule Take 1,200 mg by mouth every evening.    . cetirizine (ZYRTEC) 5 MG tablet Take 5 mg by mouth daily.    . naproxen sodium (ANAPROX) 220 MG tablet Take 2 tablets (440 mg total) by mouth every 12 (twelve) hours as needed (pain). (Patient not taking: Reported on 02/15/2016) 20 tablet 0   No facility-administered medications prior to visit.     Allergies:  Allergies    Allergen Reactions  . Corn-Containing Products   . Dolobid [Diflunisal]   . Doxycycline   . Erythromycin   . Eucalyptus Oil   . Haldol [Haloperidol Decanoate]   . Keflex [Cephalexin]   . Macrodantin   . Molds & Smuts   . Penicillins   . Septra [Bactrim]   . Tetracyclines & Related   . Tofranil-Pm   . Vicodin [Hydrocodone-Acetaminophen]       Review of Systems: See HPI for pertinent ROS. All other ROS negative.    Physical Exam: Blood pressure 140/70, pulse 83, temperature 98.9 F (37.2 C), temperature source Oral, resp. rate 16, weight 244 lb (110.7 kg), last menstrual period 07/30/2013, SpO2 98 %., Body mass index is 36.56 kg/m. General: Obese WF.  Appears in no acute distress. HEENT: Normocephalic, atraumatic, eyes without discharge, sclera non-icteric, nares are without discharge. Bilateral auditory canals clear, TM's are without perforation, pearly grey and translucent with reflective cone of light bilaterally. Oral cavity moist, posterior pharynx without exudate, erythema, peritonsillar abscess.  Neck: Supple. No thyromegaly. No lymphadenopathy. Lungs: Clear bilaterally to auscultation without wheezes, rales, or rhonchi. Breathing is unlabored. Heart: Regular rhythm. No murmurs, rubs, or gallops. Msk:  Strength and tone normal for age. Extremities/Skin: Warm and dry.  Neuro: Alert and oriented X 3. Moves all extremities spontaneously. Gait is normal. CNII-XII grossly in  tact. Psych:  Responds to questions appropriately with a normal affect.     ASSESSMENT AND PLAN:  55 y.o. year old female with  1. Bacterial respiratory infection She has many medication allergies. States that she can take azithromycin/Z-Pak. Even though she cannot take erythromycin, she can take azithromycin.   She is to take antibiotic as directed. Can continue guaifenesin expectorant. F/ up if symptoms do not resolve within one week after completion of antibiotic. - azithromycin (ZITHROMAX) 250 MG  tablet; Day 1: Take 2 daily. Days 2-5: Take 1 daily.  Dispense: 6 tablet; Refill: 0   Signed, [address] [place], [place], [facility] 02/15/2016 3:57 PM

## 2016-02-17 ENCOUNTER — Telehealth: Payer: Self-pay | Admitting: Family Medicine

## 2016-02-17 MED ORDER — LEVOFLOXACIN 500 MG PO TABS: 500.0000 mg | ORAL_TABLET | Freq: Every day | ORAL | 0 refills | Status: DC

## 2016-02-17 NOTE — Telephone Encounter (Signed)
Pt called has allergy to zpak  Given levaquin 500mg  x 5 days Multiple allergies discussed treatment with her pharmacist  taking mucinex   Seen in clinic on Friday for Bronchitis like symptoms

## 2016-02-19 ENCOUNTER — Telehealth: Payer: Self-pay

## 2016-02-19 NOTE — Telephone Encounter (Signed)
Fax came through from [employer] stating they have  on file that the pt is allergic to this class of antibiotics and would like to know if you would like to change her Rx?   Azithromycin  Is the Rx switch the Rx?

## 2016-02-20 NOTE — Telephone Encounter (Signed)
[place] stated Dr Buelah Manis changed rx to levofloxacin 500mg 

## 2016-02-20 NOTE — Telephone Encounter (Signed)
Reviewed my office note. At the office visit we discussed that she has multiple medication allergies and that she cannot take erythromycin but that she knew she can take the azithromycin. Go ahead and fill the azithromycin.

## 2016-03-12 ENCOUNTER — Ambulatory Visit (INDEPENDENT_AMBULATORY_CARE_PROVIDER_SITE_OTHER): Payer: Self-pay | Admitting: Podiatry

## 2016-03-12 DIAGNOSIS — M779 Enthesopathy, unspecified: Secondary | ICD-10-CM

## 2016-03-12 NOTE — Patient Instructions (Signed)

## 2016-03-28 NOTE — Progress Notes (Signed)
Patient presents for orthotic pick up.  Verbal and written break in and wear instructions given.  Patient will follow up in 4 weeks if symptoms worsen or fail to improve. 

## 2016-04-08 ENCOUNTER — Other Ambulatory Visit: Payer: Self-pay | Admitting: Family Medicine

## 2016-05-21 ENCOUNTER — Other Ambulatory Visit: Payer: Self-pay | Admitting: Family Medicine

## 2016-05-21 DIAGNOSIS — Z1231 Encounter for screening mammogram for malignant neoplasm of breast: Secondary | ICD-10-CM

## 2016-05-28 ENCOUNTER — Encounter: Payer: BLUE CROSS/BLUE SHIELD | Admitting: Physician Assistant

## 2016-06-03 ENCOUNTER — Ambulatory Visit
Admission: RE | Admit: 2016-06-03 | Discharge: 2016-06-03 | Disposition: A | Discharge status: Home / Self Care | Payer: BLUE CROSS/BLUE SHIELD | Source: Ambulatory Visit | Attending: Family Medicine | Admitting: Family Medicine

## 2016-06-03 DIAGNOSIS — Z1231 Encounter for screening mammogram for malignant neoplasm of breast: Secondary | ICD-10-CM

## 2016-06-17 ENCOUNTER — Encounter: Payer: BLUE CROSS/BLUE SHIELD | Admitting: Family Medicine

## 2016-06-23 ENCOUNTER — Encounter: Payer: Self-pay | Admitting: Family Medicine

## 2016-07-09 ENCOUNTER — Other Ambulatory Visit: Payer: Self-pay | Admitting: Family Medicine

## 2016-07-09 ENCOUNTER — Other Ambulatory Visit: Payer: BLUE CROSS/BLUE SHIELD

## 2016-07-09 DIAGNOSIS — Z79899 Other long term (current) drug therapy: Secondary | ICD-10-CM

## 2016-07-09 DIAGNOSIS — I10 Essential (primary) hypertension: Secondary | ICD-10-CM

## 2016-07-09 DIAGNOSIS — E039 Hypothyroidism, unspecified: Secondary | ICD-10-CM

## 2016-07-09 DIAGNOSIS — Z0000 Encounter for general adult medical examination without abnormal findings: Secondary | ICD-10-CM

## 2016-07-14 ENCOUNTER — Encounter: Payer: BLUE CROSS/BLUE SHIELD | Admitting: Physician Assistant

## 2016-08-06 ENCOUNTER — Encounter: Payer: Self-pay | Admitting: Family Medicine

## 2016-08-06 ENCOUNTER — Ambulatory Visit (INDEPENDENT_AMBULATORY_CARE_PROVIDER_SITE_OTHER): Payer: BLUE CROSS/BLUE SHIELD | Admitting: Family Medicine

## 2016-08-06 VITALS — BP 110/80 | HR 80 | Temp 98.8°F | Resp 20 | Ht 66.0 in | Wt 242.0 lb

## 2016-08-06 DIAGNOSIS — J40 Bronchitis, not specified as acute or chronic: Secondary | ICD-10-CM | POA: Diagnosis not present

## 2016-08-06 MED ORDER — LEVOFLOXACIN 500 MG PO TABS: 500.0000 mg | ORAL_TABLET | Freq: Every day | ORAL | 0 refills | Status: DC

## 2016-08-06 NOTE — Progress Notes (Signed)
Subjective:    Patient ID: Leah Powers, female    DOB: 02/28/1961, 56 y.o.   MRN: [id number]  HPI Patient reports a 48 hour history of subjective fevers, cough productive of sputum, left posterior pleurisy, shortness of breath, frequent coughing spasms, and rhinorrhea. She is afebrile today but her temperature last night was 100. On exam today she has clear rhinorrhea but she has prominent left posterior basilar crackles that are concerning for walking pneumonia. Past Medical History:  Diagnosis Date  . Bipolar 1 disorder ([place])   . Hypertension   . Hypothyroid    No past surgical history on file. Current Outpatient Prescriptions on File Prior to Visit  Medication Sig Dispense Refill  . benazepril (LOTENSIN) 10 MG tablet TAKE ONE TABLET BY MOUTH ONCE DAILY 90 tablet 3  . cetirizine (ZYRTEC) 5 MG tablet Take 5 mg by mouth daily.    . hydrochlorothiazide (HYDRODIURIL) 25 MG tablet TAKE ONE TABLET BY MOUTH ONCE DAILY 90 tablet 1  . hydroxypropyl methylcellulose (ISOPTO TEARS) 2.5 % ophthalmic solution Place 2 drops into both eyes 2 (two) times daily as needed. For dry eyes    . levothyroxine (SYNTHROID, LEVOTHROID) 137 MCG tablet Take 1 tablet (137 mcg total) by mouth daily. 30 tablet 5  . lithium carbonate 300 MG capsule Take 1,200 mg by mouth every evening.    . naproxen sodium (ANAPROX) 220 MG tablet Take 2 tablets (440 mg total) by mouth every 12 (twelve) hours as needed (pain). 20 tablet 0   No current facility-administered medications on file prior to visit.    Allergies  Allergen Reactions  . Corn-Containing Products   . Dolobid [Diflunisal]   . Doxycycline   . Erythromycin   . Eucalyptus Oil   . Haldol [Haloperidol Decanoate]   . Keflex [Cephalexin]   . Macrodantin   . Molds & Smuts   . Penicillins   . Septra [Bactrim]   . Tetracyclines & Related   . Tofranil-Pm   . Vicodin [Hydrocodone-Acetaminophen]    Social History   Social History  . Marital status: Divorced     Spouse name: N/A  . Number of children: N/A  . Years of education: N/A   Occupational History  . Not on file.   Social History Main Topics  . Smoking status: Never Smoker  . Smokeless tobacco: Never Used  . Alcohol use No  . Drug use: No  . Sexual activity: Not on file   Other Topics Concern  . Not on file   Social History Narrative  . No narrative on file      Review of Systems  All other systems reviewed and are negative.      Objective:   Physical Exam  Constitutional: She appears well-developed and well-nourished. No distress.  HENT:  Right Ear: External ear normal.  Left Ear: External ear normal.  Nose: Mucosal edema and rhinorrhea present.  Mouth/Throat: Oropharynx is clear and moist. No oropharyngeal exudate.  Neck: Neck supple.  Cardiovascular: Normal rate, regular rhythm and normal heart sounds.   No murmur heard. Pulmonary/Chest: Effort normal. No respiratory distress. She has no wheezes. She has rhonchi in the left lower field.      Lymphadenopathy:    She has no cervical adenopathy.  Skin: She is not diaphoretic.  Vitals reviewed.         Assessment & Plan:  Bronchitis - Plan: levofloxacin (LEVAQUIN) 500 MG tablet  Patient has bronchitis versus early walking pneumonia based on her  pulmonary exam. Begin Levaquin 500 mg by mouth daily for 7 days based on her allergy profile. Take Mucinex as a mucolytic. Recheck in 48 hours or sooner if worse

## 2016-08-11 ENCOUNTER — Other Ambulatory Visit: Payer: Self-pay | Admitting: Family Medicine

## 2016-08-11 DIAGNOSIS — J40 Bronchitis, not specified as acute or chronic: Secondary | ICD-10-CM

## 2016-08-14 ENCOUNTER — Ambulatory Visit (INDEPENDENT_AMBULATORY_CARE_PROVIDER_SITE_OTHER): Payer: Self-pay

## 2016-08-14 ENCOUNTER — Encounter (INDEPENDENT_AMBULATORY_CARE_PROVIDER_SITE_OTHER): Payer: Self-pay | Admitting: Orthopedic Surgery

## 2016-08-14 ENCOUNTER — Ambulatory Visit (INDEPENDENT_AMBULATORY_CARE_PROVIDER_SITE_OTHER): Payer: BLUE CROSS/BLUE SHIELD | Admitting: Orthopedic Surgery

## 2016-08-14 VITALS — Ht 66.0 in | Wt 242.0 lb

## 2016-08-14 DIAGNOSIS — M79672 Pain in left foot: Secondary | ICD-10-CM

## 2016-08-14 DIAGNOSIS — M79671 Pain in right foot: Secondary | ICD-10-CM

## 2016-08-14 DIAGNOSIS — M19072 Primary osteoarthritis, left ankle and foot: Secondary | ICD-10-CM | POA: Diagnosis not present

## 2016-08-14 DIAGNOSIS — M76821 Posterior tibial tendinitis, right leg: Secondary | ICD-10-CM

## 2016-08-14 NOTE — Progress Notes (Signed)
Office Visit Note   Patient: Leah Powers           Date of Birth: 28-Feb-1961           MRN: [id number] Visit Date: 08/14/2016              Requested by: Susy Frizzle, MD [address] [place], [place] [address] PCP: Susy Frizzle, MD  Chief Complaint  Patient presents with  . Left Foot - Pain  . Right Foot - Pain      HPI: Patient is a 56 year old woman who complains of chronic pain in both feet including the left foot with a rocker-bottom deformity medially in the right foot with pronation and valgus and pain primarily laterally. Patient states that she has been seen by podiatry and was placed in orthotics for the left foot in a brace for the right foot. She states that both of these made her worse. Patient also states that she's been having trouble with onychomycotic nails of her great toenails. Patient denies history of diabetes.  Assessment & Plan: Visit Diagnoses:  1. Pain in right foot   2. Pain in left foot   3. Posterior tibial tendon dysfunction (PTTD) of right lower extremity   4. Arthritis of foot, left     Plan: Discussed that I do not feel that orthotics would help her left foot rocker bottom deformity due to collapse of the base of the first metatarsal medial cuneiform. Discussed that surgical treatment would include a fusion of this joint with realignment of the joint. Discussed that she would be off her foot for at least 6 weeks. Discussed that for the right foot with the fixed pronation and valgus of the hindfoot quire a triple arthrodesis. Patient has collapse through the calcaneocuboid joint as well as the posterior facet and would need realignment through the talonavicular joint. Discussed that this would take longer to heal and would take probably 3 months. Discussed that she would not be driving or shopping after her surgeries. We will recommend proceeding with 1 foot at a time risk and benefits of surgery was discussed patient states she  understands and she would call to set this up for surgery at her convenience. She will discontinue using her orthotic and brace.  Follow-Up Instructions: Return in about 2 weeks (around 08/28/2016).   Ortho Exam  Patient is alert, oriented, no adenopathy, well-dressed, normal affect, normal respiratory effort. Examination patient has an antalgic gait she has a positive too many toes sign on the right cannot do a single limb heel raise she has good pulses in both feet left foot she has collapse through the base of the first metatarsal medial cuneiform with large osteophytic bone spurs dorsally she is developing an ulcer beneath the rocker bottom deformity. Examination of right foot she has a fixed pronation and valgus through the forefoot she has essentially no subtalar motion she is tender to palpation the sinus Tarsi and tender to palpation at the calcaneocuboid joint.  Imaging: Xr Foot 2 Views Left  Result Date: 08/14/2016 2 view radiographs of the left foot shows collapse through the base of the first metatarsal medial cuneiform with a rocker bottom deformity at this joint and large osteophytic bone spurs dorsally.  Xr Foot 2 Views Right  Result Date: 08/14/2016 2 view radiographs of the right foot shows pronation and valgus through the midfoot with osteophytic bone spurs of the calcaneocuboid joint with collapse of the subtalar joint.  Labs: Lab Results  Component Value Date   HGBA1C 5.9 (H) 05/26/2014   GRAMSTAIN No WBC Seen 06/13/2013   GRAMSTAIN No Squamous Epithelial Cells Seen 06/13/2013   GRAMSTAIN No Organisms Seen 06/13/2013   LABORGA Multiple Organisms Present,None Predominant 06/13/2013    Orders:  Orders Placed This Encounter  Procedures  . XR Foot 2 Views Right  . XR Foot 2 Views Left   No orders of the defined types were placed in this encounter.    Procedures: No procedures performed  Clinical Data: No additional findings.  ROS:  All other systems  negative, except as noted in the HPI. Review of Systems  Objective: Vital Signs: Ht 5\' 6"  (1.676 m)   Wt 242 lb (109.8 kg)   LMP 07/30/2013   BMI 39.06 kg/m   Specialty Comments:  No specialty comments available.  PMFS History: Patient Active Problem List   Diagnosis Date Noted  . HTN (hypertension) 05/31/2014  . Hypothyroidism 05/31/2014   Past Medical History:  Diagnosis Date  . Bipolar 1 disorder ([place])   . Hypertension   . Hypothyroid     Family History  Problem Relation Age of Onset  . Breast cancer Maternal Aunt   . Breast cancer Maternal Grandmother     No past surgical history on file. Social History   Occupational History  . Not on file.   Social History Main Topics  . Smoking status: Never Smoker  . Smokeless tobacco: Never Used  . Alcohol use No  . Drug use: No  . Sexual activity: Not on file

## 2016-08-25 ENCOUNTER — Encounter (INDEPENDENT_AMBULATORY_CARE_PROVIDER_SITE_OTHER): Payer: Self-pay | Admitting: Orthopedic Surgery

## 2016-09-08 ENCOUNTER — Other Ambulatory Visit (INDEPENDENT_AMBULATORY_CARE_PROVIDER_SITE_OTHER): Payer: Self-pay | Admitting: Family

## 2016-09-12 ENCOUNTER — Encounter: Payer: Self-pay | Admitting: Physician Assistant

## 2016-09-18 ENCOUNTER — Other Ambulatory Visit: Payer: Self-pay | Admitting: Family Medicine

## 2016-10-01 NOTE — Pre-Procedure Instructions (Signed)
Leah Powers  10/01/2016      [facility] [address] [place] [address] [place] [place] [address] Phone: [phone] Fax: [phone]    Your procedure is scheduled on October 08, 2016.  Report to [facility] Admitting at 530 AM.  Call this number if you have problems the morning of surgery:  [phone]   Remember:  Do not eat food or drink liquids after midnight.  Take these medicines the morning of surgery with A SIP OF WATER benazepril (lotensin), cetirizine (zyrtec), gabapentin (neurontin), levofloxacin (levaquin), levothyroxine (synthroid).   7 days prior to surgery STOP taking any Aspirin, Aleve, Naproxen, Ibuprofen, Motrin, Advil, Goody's, BC's, all herbal medications, fish oil, and all vitamins   Do not wear jewelry, make-up or nail polish.  Do not wear lotions, powders, or perfumes, or deoderant.  Do not shave 48 hours prior to surgery.  Men may shave face and neck.  Do not bring valuables to the hospital.  [facility] is not responsible for any belongings or valuables.  Contacts, dentures or bridgework may not be worn into surgery.  Leave your suitcase in the car.  After surgery it may be brought to your room.  For patients admitted to the hospital, discharge time will be determined by your treatment team.  Patients discharged the day of surgery will not be allowed to drive home.   Special instructions:   [facility]- Preparing For Surgery  Before surgery, you can play an important role. Because skin is not sterile, your skin needs to be as free of germs as possible. You can reduce the number of germs on your skin by washing with CHG (chlorahexidine gluconate) Soap before surgery.  CHG is an antiseptic cleaner which kills germs and bonds with the skin to continue killing germs even after washing.  Please do not use if you have an allergy to CHG or antibacterial soaps. If your skin becomes reddened/irritated  stop using the CHG.  Do not shave (including legs and underarms) for at least 48 hours prior to first CHG shower. It is OK to shave your face.  Please follow these instructions carefully.   1. Shower the NIGHT BEFORE SURGERY and the MORNING OF SURGERY with CHG.   2. If you chose to wash your hair, wash your hair first as usual with your normal shampoo.  3. After you shampoo, rinse your hair and body thoroughly to remove the shampoo.  4. Use CHG as you would any other liquid soap. You can apply CHG directly to the skin and wash gently with a scrungie or a clean washcloth.   5. Apply the CHG Soap to your body ONLY FROM THE NECK DOWN.  Do not use on open wounds or open sores. Avoid contact with your eyes, ears, mouth and genitals (private parts). Wash genitals (private parts) with your normal soap.  6. Wash thoroughly, paying special attention to the area where your surgery will be performed.  7. Thoroughly rinse your body with warm water from the neck down.  8. DO NOT shower/wash with your normal soap after using and rinsing off the CHG Soap.  9. Pat yourself dry with a CLEAN TOWEL.   10. Wear CLEAN PAJAMAS   11. Place CLEAN SHEETS on your bed the night of your first shower and DO NOT SLEEP WITH PETS.    Day of Surgery: Do not apply any deodorants/lotions. Please wear clean clothes to the hospital/surgery center.  Please read over the following fact sheets that you were given. Pain Booklet, Coughing and Deep Breathing and Surgical Site Infection Prevention

## 2016-10-02 ENCOUNTER — Encounter (HOSPITAL_COMMUNITY): Payer: Self-pay

## 2016-10-02 ENCOUNTER — Encounter (HOSPITAL_COMMUNITY)
Admission: RE | Admit: 2016-10-02 | Discharge: 2016-10-02 | Disposition: A | Discharge status: Home / Self Care | Payer: BLUE CROSS/BLUE SHIELD | Source: Ambulatory Visit | Attending: Orthopedic Surgery | Admitting: Orthopedic Surgery

## 2016-10-02 DIAGNOSIS — Z01812 Encounter for preprocedural laboratory examination: Secondary | ICD-10-CM | POA: Diagnosis present

## 2016-10-02 DIAGNOSIS — M76821 Posterior tibial tendinitis, right leg: Secondary | ICD-10-CM | POA: Insufficient documentation

## 2016-10-02 DIAGNOSIS — Z01810 Encounter for preprocedural cardiovascular examination: Secondary | ICD-10-CM | POA: Insufficient documentation

## 2016-10-02 HISTORY — DX: Fibromyalgia: M79.7

## 2016-10-02 HISTORY — DX: Personal history of urinary calculi: Z87.442

## 2016-10-02 HISTORY — DX: Pneumonia, unspecified organism: J18.9

## 2016-10-02 HISTORY — DX: Unspecified osteoarthritis, unspecified site: M19.90

## 2016-10-02 HISTORY — DX: Tubulo-interstitial nephritis, not specified as acute or chronic: N12

## 2016-10-02 LAB — CBC
HCT: 41.4 % (ref 36.0–46.0)
Hemoglobin: 13 g/dL (ref 12.0–15.0)
MCH: 29.1 pg (ref 26.0–34.0)
MCHC: 31.4 g/dL (ref 30.0–36.0)
MCV: 92.6 fL (ref 78.0–100.0)
Platelets: 349 10*3/uL (ref 150–400)
RBC: 4.47 MIL/uL (ref 3.87–5.11)
RDW: 13.1 % (ref 11.5–15.5)
WBC: 9.3 10*3/uL (ref 4.0–10.5)

## 2016-10-02 LAB — BASIC METABOLIC PANEL
Anion gap: 8 (ref 5–15)
BUN: 14 mg/dL (ref 6–20)
CO2: 25 mmol/L (ref 22–32)
Calcium: 9.7 mg/dL (ref 8.9–10.3)
Chloride: 106 mmol/L (ref 101–111)
Creatinine, Ser: 0.75 mg/dL (ref 0.44–1.00)
GFR calc Af Amer: >60 mL/min (ref >60)
GFR calc non Af Amer: >60 mL/min (ref >60)
Glucose, Bld: 104 mg/dL — ABNORMAL HIGH (ref 65–99)
Potassium: 4.1 mmol/L (ref 3.5–5.1)
Sodium: 139 mmol/L (ref 135–145)

## 2016-10-02 LAB — SURGICAL PCR SCREEN
MRSA, PCR: POSITIVE — AB
Staphylococcus aureus: POSITIVE — AB

## 2016-10-02 NOTE — Progress Notes (Signed)
Pt. Seen in PAT appt., sleep scoring shows a concern, in response the scoring was sent to new PCP- Dr. Daron Offer, who evaluated her for a wellness visit, just yesterday. Pt. Denies ever having a stress test or any advanced cardiology testing in the past.

## 2016-10-02 NOTE — Progress Notes (Signed)
   10/02/16 0901  OBSTRUCTIVE SLEEP APNEA  Have you ever been diagnosed with sleep apnea through a sleep study? No  Do you snore loudly (loud enough to be heard through closed doors)?  1  Do you often feel tired, fatigued, or sleepy during the daytime (such as falling asleep during driving or talking to someone)? 0  Has anyone observed you stop breathing during your sleep? 0  Do you have, or are you being treated for high blood pressure? 1  BMI more than 35 kg/m2? 1  Age > 50 (1-yes) 1  Neck circumference greater than:Female 16 inches or larger, Female 17inches or larger? 1  Female Gender (Yes=1) 0  Obstructive Sleep Apnea Score 5  Score 5 or greater  Results sent to PCP

## 2016-10-07 ENCOUNTER — Telehealth (INDEPENDENT_AMBULATORY_CARE_PROVIDER_SITE_OTHER): Payer: Self-pay | Admitting: Orthopedic Surgery

## 2016-10-07 NOTE — Telephone Encounter (Signed)
Ask if patient handicapped parking as well as prescription for a kneeling scooter. She should be able to get a walker from the hospital.

## 2016-10-07 NOTE — Telephone Encounter (Signed)
Pt came in requesting handicap placard, order for walker, crutches, or walker/cane. Pt is having surgery tomorrow on right ankle

## 2016-10-07 NOTE — Telephone Encounter (Signed)
Called pt and advised that parking form is at the front desk.  She will stop by [employer] [facility] for the scooter.

## 2016-10-07 NOTE — Telephone Encounter (Signed)
I have handicap placard filled out for you. See message below do [facility] want to give rx for knee walker or walker tomorrow at surgery? Having a fusion

## 2016-10-08 ENCOUNTER — Encounter (HOSPITAL_COMMUNITY): Payer: Self-pay

## 2016-10-08 ENCOUNTER — Encounter (HOSPITAL_COMMUNITY)
Admission: RE | Disposition: A | Discharge status: Home / Self Care | Payer: Self-pay | Source: Ambulatory Visit | Attending: Orthopedic Surgery

## 2016-10-08 ENCOUNTER — Ambulatory Visit (HOSPITAL_COMMUNITY)
Admission: RE | Admit: 2016-10-08 | Discharge: 2016-10-09 | Disposition: A | Discharge status: Home / Self Care | Payer: BLUE CROSS/BLUE SHIELD | Source: Ambulatory Visit | Attending: Orthopedic Surgery | Admitting: Orthopedic Surgery

## 2016-10-08 ENCOUNTER — Ambulatory Visit (HOSPITAL_COMMUNITY): Payer: BLUE CROSS/BLUE SHIELD | Admitting: Certified Registered"

## 2016-10-08 DIAGNOSIS — F319 Bipolar disorder, unspecified: Secondary | ICD-10-CM | POA: Insufficient documentation

## 2016-10-08 DIAGNOSIS — M797 Fibromyalgia: Secondary | ICD-10-CM | POA: Insufficient documentation

## 2016-10-08 DIAGNOSIS — I10 Essential (primary) hypertension: Secondary | ICD-10-CM | POA: Insufficient documentation

## 2016-10-08 DIAGNOSIS — Z87442 Personal history of urinary calculi: Secondary | ICD-10-CM | POA: Insufficient documentation

## 2016-10-08 DIAGNOSIS — Z885 Allergy status to narcotic agent status: Secondary | ICD-10-CM | POA: Insufficient documentation

## 2016-10-08 DIAGNOSIS — M1990 Unspecified osteoarthritis, unspecified site: Secondary | ICD-10-CM | POA: Diagnosis not present

## 2016-10-08 DIAGNOSIS — Z880 Allergy status to penicillin: Secondary | ICD-10-CM | POA: Insufficient documentation

## 2016-10-08 DIAGNOSIS — M76821 Posterior tibial tendinitis, right leg: Secondary | ICD-10-CM | POA: Diagnosis present

## 2016-10-08 DIAGNOSIS — Z79899 Other long term (current) drug therapy: Secondary | ICD-10-CM | POA: Insufficient documentation

## 2016-10-08 DIAGNOSIS — E039 Hypothyroidism, unspecified: Secondary | ICD-10-CM | POA: Diagnosis not present

## 2016-10-08 HISTORY — PX: FOOT ARTHRODESIS, TRIPLE: SUR54

## 2016-10-08 HISTORY — PX: FOOT ARTHRODESIS: SHX1655

## 2016-10-08 LAB — GLUCOSE, CAPILLARY: Glucose-Capillary: 135 mg/dL — ABNORMAL HIGH (ref 65–99)

## 2016-10-08 SURGERY — FUSION, JOINT, FOOT
Anesthesia: General | Site: Ankle | Laterality: Right

## 2016-10-08 MED ORDER — KETOROLAC TROMETHAMINE 15 MG/ML IJ SOLN
INTRAMUSCULAR | Status: AC
  Filled 2016-10-08: qty 1

## 2016-10-08 MED ORDER — FENTANYL CITRATE (PF) 100 MCG/2ML IJ SOLN
INTRAMUSCULAR | Status: AC
  Filled 2016-10-08: qty 2

## 2016-10-08 MED ORDER — ACETAMINOPHEN 325 MG PO TABS
650.0000 mg | ORAL_TABLET | Freq: Four times a day (QID) | ORAL | Status: DC | PRN
Start: 2016-10-08 — End: 2016-10-09

## 2016-10-08 MED ORDER — FENTANYL CITRATE (PF) 100 MCG/2ML IJ SOLN
25.0000 ug | INTRAMUSCULAR | Status: DC | PRN
  Administered 2016-10-08: 50 ug via INTRAVENOUS

## 2016-10-08 MED ORDER — LIDOCAINE 2% (20 MG/ML) 5 ML SYRINGE
INTRAMUSCULAR | Status: AC
  Filled 2016-10-08: qty 5

## 2016-10-08 MED ORDER — FENTANYL CITRATE (PF) 100 MCG/2ML IJ SOLN
INTRAMUSCULAR | Status: DC | PRN
  Administered 2016-10-08 (×2): 25 ug via INTRAVENOUS
  Administered 2016-10-08 (×4): 50 ug via INTRAVENOUS

## 2016-10-08 MED ORDER — ACETAMINOPHEN 650 MG RE SUPP: 650.0000 mg | Freq: Four times a day (QID) | RECTAL | Status: DC | PRN

## 2016-10-08 MED ORDER — ONDANSETRON HCL 4 MG/2ML IJ SOLN
INTRAMUSCULAR | Status: DC | PRN
  Administered 2016-10-08: 4 mg via INTRAVENOUS

## 2016-10-08 MED ORDER — DEXAMETHASONE SODIUM PHOSPHATE 10 MG/ML IJ SOLN
INTRAMUSCULAR | Status: DC | PRN
  Administered 2016-10-08: 5 mg via INTRAVENOUS

## 2016-10-08 MED ORDER — ONDANSETRON HCL 4 MG/2ML IJ SOLN: 4.0000 mg | Freq: Four times a day (QID) | INTRAMUSCULAR | Status: DC | PRN

## 2016-10-08 MED ORDER — KETOROLAC TROMETHAMINE 15 MG/ML IJ SOLN
15.0000 mg | Freq: Four times a day (QID) | INTRAMUSCULAR | Status: AC
  Administered 2016-10-08 – 2016-10-09 (×4): 15 mg via INTRAVENOUS
  Filled 2016-10-08 (×3): qty 1

## 2016-10-08 MED ORDER — PROPOFOL 10 MG/ML IV BOLUS
INTRAVENOUS | Status: AC
  Filled 2016-10-08: qty 20

## 2016-10-08 MED ORDER — BISACODYL 10 MG RE SUPP: 10.0000 mg | Freq: Every day | RECTAL | Status: DC | PRN

## 2016-10-08 MED ORDER — LACTATED RINGERS IV SOLN
INTRAVENOUS | Status: DC | PRN
  Administered 2016-10-08: 07:00:00 via INTRAVENOUS

## 2016-10-08 MED ORDER — ONDANSETRON HCL 4 MG PO TABS: 4.0000 mg | ORAL_TABLET | Freq: Four times a day (QID) | ORAL | Status: DC | PRN

## 2016-10-08 MED ORDER — OXYCODONE HCL 5 MG PO TABS
5.0000 mg | ORAL_TABLET | ORAL | Status: DC | PRN
  Administered 2016-10-08: 10 mg via ORAL
  Administered 2016-10-09: 5 mg via ORAL
  Filled 2016-10-08: qty 1

## 2016-10-08 MED ORDER — METHOCARBAMOL 500 MG PO TABS
ORAL_TABLET | ORAL | Status: AC
  Filled 2016-10-08: qty 1

## 2016-10-08 MED ORDER — CLINDAMYCIN PHOSPHATE 600 MG/50ML IV SOLN
600.0000 mg | Freq: Four times a day (QID) | INTRAVENOUS | Status: AC
  Administered 2016-10-08 – 2016-10-09 (×3): 600 mg via INTRAVENOUS
  Filled 2016-10-08 (×3): qty 50

## 2016-10-08 MED ORDER — BENAZEPRIL HCL 5 MG PO TABS
10.0000 mg | ORAL_TABLET | Freq: Every day | ORAL | Status: DC
  Administered 2016-10-08 – 2016-10-09 (×2): 10 mg via ORAL
  Filled 2016-10-08 (×2): qty 2

## 2016-10-08 MED ORDER — METOCLOPRAMIDE HCL 5 MG/ML IJ SOLN: 5.0000 mg | Freq: Three times a day (TID) | INTRAMUSCULAR | Status: DC | PRN

## 2016-10-08 MED ORDER — LITHIUM CARBONATE 300 MG PO CAPS
900.0000 mg | ORAL_CAPSULE | Freq: Every day | ORAL | Status: DC
  Administered 2016-10-08: 900 mg via ORAL
  Filled 2016-10-08 (×2): qty 3

## 2016-10-08 MED ORDER — LIDOCAINE 2% (20 MG/ML) 5 ML SYRINGE
INTRAMUSCULAR | Status: DC | PRN
  Administered 2016-10-08: 80 mg via INTRAVENOUS

## 2016-10-08 MED ORDER — POLYETHYLENE GLYCOL 3350 17 G PO PACK: 17.0000 g | PACK | Freq: Every day | ORAL | Status: DC | PRN

## 2016-10-08 MED ORDER — HYDROCHLOROTHIAZIDE 25 MG PO TABS
25.0000 mg | ORAL_TABLET | Freq: Every day | ORAL | Status: DC
  Administered 2016-10-08 – 2016-10-09 (×2): 25 mg via ORAL
  Filled 2016-10-08 (×2): qty 1

## 2016-10-08 MED ORDER — METHOCARBAMOL 1000 MG/10ML IJ SOLN: 500.0000 mg | Freq: Four times a day (QID) | INTRAVENOUS | Status: DC | PRN

## 2016-10-08 MED ORDER — MAGNESIUM CITRATE PO SOLN: 1.0000 | Freq: Once | ORAL | Status: DC | PRN

## 2016-10-08 MED ORDER — DOCUSATE SODIUM 100 MG PO CAPS
100.0000 mg | ORAL_CAPSULE | Freq: Two times a day (BID) | ORAL | Status: DC
  Administered 2016-10-08 – 2016-10-09 (×3): 100 mg via ORAL
  Filled 2016-10-08 (×3): qty 1

## 2016-10-08 MED ORDER — FENTANYL CITRATE (PF) 250 MCG/5ML IJ SOLN
INTRAMUSCULAR | Status: AC
  Filled 2016-10-08: qty 5

## 2016-10-08 MED ORDER — 0.9 % SODIUM CHLORIDE (POUR BTL) OPTIME
TOPICAL | Status: DC | PRN
  Administered 2016-10-08: 1000 mL

## 2016-10-08 MED ORDER — DEXAMETHASONE SODIUM PHOSPHATE 10 MG/ML IJ SOLN
INTRAMUSCULAR | Status: AC
Start: 2016-10-08 — End: 2016-10-08
  Filled 2016-10-08: qty 1

## 2016-10-08 MED ORDER — CHLORHEXIDINE GLUCONATE 4 % EX LIQD: 60.0000 mL | Freq: Once | CUTANEOUS | Status: DC

## 2016-10-08 MED ORDER — MIDAZOLAM HCL 2 MG/2ML IJ SOLN
INTRAMUSCULAR | Status: AC
  Filled 2016-10-08: qty 2

## 2016-10-08 MED ORDER — SODIUM CHLORIDE 0.9 % IV SOLN
INTRAVENOUS | Status: DC
  Administered 2016-10-08: 12:00:00 via INTRAVENOUS

## 2016-10-08 MED ORDER — METOCLOPRAMIDE HCL 5 MG PO TABS: 5.0000 mg | ORAL_TABLET | Freq: Three times a day (TID) | ORAL | Status: DC | PRN

## 2016-10-08 MED ORDER — OXYCODONE HCL 5 MG PO TABS
ORAL_TABLET | ORAL | Status: AC
  Filled 2016-10-08: qty 2

## 2016-10-08 MED ORDER — METHOCARBAMOL 500 MG PO TABS
500.0000 mg | ORAL_TABLET | Freq: Four times a day (QID) | ORAL | Status: DC | PRN
  Administered 2016-10-08 (×2): 500 mg via ORAL
  Filled 2016-10-08 (×2): qty 1

## 2016-10-08 MED ORDER — GABAPENTIN 100 MG PO CAPS
100.0000 mg | ORAL_CAPSULE | Freq: Every day | ORAL | Status: DC
  Administered 2016-10-08 – 2016-10-09 (×2): 100 mg via ORAL
  Filled 2016-10-08 (×2): qty 1

## 2016-10-08 MED ORDER — GABAPENTIN 100 MG PO CAPS
200.0000 mg | ORAL_CAPSULE | Freq: Every day | ORAL | Status: DC
  Administered 2016-10-08: 200 mg via ORAL
  Filled 2016-10-08: qty 2

## 2016-10-08 MED ORDER — ONDANSETRON HCL 4 MG/2ML IJ SOLN
INTRAMUSCULAR | Status: AC
  Filled 2016-10-08: qty 2

## 2016-10-08 MED ORDER — MIDAZOLAM HCL 5 MG/5ML IJ SOLN
INTRAMUSCULAR | Status: DC | PRN
  Administered 2016-10-08: 2 mg via INTRAVENOUS

## 2016-10-08 MED ORDER — PROPOFOL 10 MG/ML IV BOLUS
INTRAVENOUS | Status: DC | PRN
  Administered 2016-10-08: 200 mg via INTRAVENOUS

## 2016-10-08 MED ORDER — LEVOTHYROXINE SODIUM 25 MCG PO TABS
137.0000 ug | ORAL_TABLET | Freq: Every day | ORAL | Status: DC
  Administered 2016-10-09: 137 ug via ORAL
  Filled 2016-10-08: qty 1

## 2016-10-08 MED ORDER — HYDROMORPHONE HCL 1 MG/ML IJ SOLN
1.0000 mg | INTRAMUSCULAR | Status: DC | PRN
Start: 2016-10-08 — End: 2016-10-09
  Administered 2016-10-08: 1 mg via INTRAVENOUS
  Filled 2016-10-08: qty 1

## 2016-10-08 MED ORDER — CLINDAMYCIN PHOSPHATE 900 MG/50ML IV SOLN
900.0000 mg | INTRAVENOUS | Status: AC
  Administered 2016-10-08: 900 mg via INTRAVENOUS
  Filled 2016-10-08: qty 50

## 2016-10-08 SURGICAL SUPPLY — 50 items
BANDAGE ESMARK 6X9 LF (GAUZE/BANDAGES/DRESSINGS) ×1 IMPLANT
BIT DRILL KIT 2.65MM (BIT) ×1 IMPLANT
BLADE SAGITTAL (BLADE) ×1
BLADE SAW SGTL HD 18.5X60.5X1. (BLADE) ×2 IMPLANT
BLADE SAW THK.89X75X18XSGTL (BLADE) ×1 IMPLANT
BLADE SURG 10 STRL SS (BLADE) ×2 IMPLANT
BNDG COHESIVE 4X5 TAN STRL (GAUZE/BANDAGES/DRESSINGS) ×2 IMPLANT
BNDG COHESIVE 6X5 TAN STRL LF (GAUZE/BANDAGES/DRESSINGS) ×2 IMPLANT
BNDG ESMARK 6X9 LF (GAUZE/BANDAGES/DRESSINGS) ×2
BNDG GAUZE ELAST 4 BULKY (GAUZE/BANDAGES/DRESSINGS) ×2 IMPLANT
BUR EGG ELITE 4.0 (BURR) ×2 IMPLANT
COTTON STERILE ROLL (GAUZE/BANDAGES/DRESSINGS) ×2 IMPLANT
COVER MAYO STAND STRL (DRAPES) IMPLANT
COVER SURGICAL LIGHT HANDLE (MISCELLANEOUS) ×4 IMPLANT
DRAPE INCISE IOBAN 66X45 STRL (DRAPES) ×2 IMPLANT
DRAPE OEC MINIVIEW 54X84 (DRAPES) ×2 IMPLANT
DRAPE U-SHAPE 47X51 STRL (DRAPES) ×2 IMPLANT
DRILL KIT 2.65MM (BIT) ×2
DRSG ADAPTIC 3X8 NADH LF (GAUZE/BANDAGES/DRESSINGS) ×2 IMPLANT
DURAPREP 26ML APPLICATOR (WOUND CARE) ×2 IMPLANT
ELECT REM PT RETURN 9FT ADLT (ELECTROSURGICAL) ×2
ELECTRODE REM PT RTRN 9FT ADLT (ELECTROSURGICAL) ×1 IMPLANT
GAUZE SPONGE 4X4 12PLY STRL (GAUZE/BANDAGES/DRESSINGS) ×2 IMPLANT
GLOVE BIOGEL PI IND STRL 9 (GLOVE) ×1 IMPLANT
GLOVE BIOGEL PI INDICATOR 9 (GLOVE) ×1
GLOVE SURG ORTHO 9.0 STRL STRW (GLOVE) ×2 IMPLANT
GOWN STRL REUS W/ TWL XL LVL3 (GOWN DISPOSABLE) ×3 IMPLANT
GOWN STRL REUS W/TWL XL LVL3 (GOWN DISPOSABLE) ×3
GUIDEWIRE NONTHRD 300X2.8 (WIRE) ×2 IMPLANT
IMPL SPEED TITAN 20X20X20 (Orthopedic Implant) ×2 IMPLANT
IMPLANT SPEED TITAN 20X20X20 (Orthopedic Implant) ×4 IMPLANT
KIT BASIN OR (CUSTOM PROCEDURE TRAY) ×2 IMPLANT
KIT ROOM TURNOVER OR (KITS) ×2 IMPLANT
MANIFOLD NEPTUNE II (INSTRUMENTS) ×2 IMPLANT
NS IRRIG 1000ML POUR BTL (IV SOLUTION) ×2 IMPLANT
PACK ORTHO EXTREMITY (CUSTOM PROCEDURE TRAY) ×2 IMPLANT
PAD ABD 8X10 STRL (GAUZE/BANDAGES/DRESSINGS) ×2 IMPLANT
PAD ARMBOARD 7.5X6 YLW CONV (MISCELLANEOUS) ×4 IMPLANT
PAD CAST 4YDX4 CTTN HI CHSV (CAST SUPPLIES) ×1 IMPLANT
PADDING CAST COTTON 4X4 STRL (CAST SUPPLIES) ×1
PUTTY DBX 10CC (Bone Implant) ×2 IMPLANT
SCREW CANN COMP 6.5X85 16MM (Screw) ×2 IMPLANT
SPONGE LAP 18X18 X RAY DECT (DISPOSABLE) ×2 IMPLANT
SUCTION FRAZIER HANDLE 10FR (MISCELLANEOUS) ×1
SUCTION TUBE FRAZIER 10FR DISP (MISCELLANEOUS) ×1 IMPLANT
SUT ETHILON 2 0 PSLX (SUTURE) ×6 IMPLANT
TOWEL OR 17X24 6PK STRL BLUE (TOWEL DISPOSABLE) ×2 IMPLANT
TOWEL OR 17X26 10 PK STRL BLUE (TOWEL DISPOSABLE) ×2 IMPLANT
TUBE CONNECTING 12X1/4 (SUCTIONS) ×2 IMPLANT
WATER STERILE IRR 1000ML POUR (IV SOLUTION) ×2 IMPLANT

## 2016-10-08 NOTE — H&P (Signed)
Leah Powers is an 56 y.o. female.   Chief Complaint: Right foot pain with ambulation. HPI: Patient is a 56 year old woman with posterior tibial tendon insufficiency with pronated valgus forefoot with arthritis involving the calcaneocuboid subtalar and talonavicular joints. Patient is failed prolonged conservative therapy including bracing and patient presents at this time for triple arthrodesis.  Past Medical History:  Diagnosis Date  . Arthritis    L hand, R ankle   . Bipolar 1 disorder ([place])   . Fibromyalgia    told that the breakout on the upper back , could be over active nerves , also treated with cream & Gabapentin- [facility] [place]   . History of kidney stones    spontaneous passing  . Hypertension   . Hypothyroid   . Pneumonia    hosp. as a child   . Pyelonephritis 1997   treated with IV antibiotic     Past Surgical History:  Procedure Laterality Date  . BLADDER SURGERY     bladder wash two times & dilation of urethra    . CHALAZION EXCISION     R- eyex2 , L eye- [place]  . MOUTH SURGERY  1970   tissue removed from upper palate as a child     Family History  Problem Relation Age of Onset  . Breast cancer Maternal Aunt   . Breast cancer Maternal Grandmother    Social History:  reports that she has never smoked. She has never used smokeless tobacco. She reports that she does not drink alcohol or use drugs.  Allergies:  Allergies  Allergen Reactions  . Eucalyptus Oil Shortness Of Breath  . Haldol [Haloperidol Decanoate] Other (See Comments)    Drooling, weaving in floor, parkinson's syndrome (couldn't eat or talk) , pt was hospitalized   . Molds & Smuts Shortness Of Breath and Itching  . Other Anaphylaxis, Shortness Of Breath, Itching and Swelling    Bleu cheese   . Penicillins Anaphylaxis and Rash    Pt was 56 years old  Has patient had a PCN reaction causing immediate rash, facial/tongue/throat swelling, SOB or  lightheadedness with hypotension: Yes Has patient had a PCN reaction causing severe rash involving mucus membranes or skin necrosis: Unknown Has patient had a PCN reaction that required hospitalization: No Has patient had a PCN reaction occurring within the last 10 years: No If all of the above answers are "NO", then may proceed with Cephalosporin use.   . Bee Venom Other (See Comments)    Yellow jackets and wasps-stung by 42, MD told pt that she wouldn't have resistance if stung again   . Corn-Containing Products Nausea And Vomiting and Other (See Comments)    Stomach distress   . Demeclocycline Other (See Comments)  . Dolobid [Diflunisal] Other (See Comments)    Upset stomach  . Vicodin [Hydrocodone-Acetaminophen] Diarrhea and Other (See Comments)    Upset stomach  . Doxycycline Rash  . Erythromycin Rash  . Keflex [Cephalexin] Rash  . Macrodantin Rash  . Motrin [Ibuprofen] Palpitations  . Septra [Bactrim] Rash  . Tetracyclines & Related Rash  . Tofranil-Pm Rash    Medications Prior to Admission  Medication Sig Dispense Refill  . benazepril (LOTENSIN) 10 MG tablet TAKE ONE TABLET BY MOUTH ONCE DAILY 90 tablet 3  . cetirizine (ZYRTEC) 5 MG tablet Take 5 mg by mouth 2 (two) times daily as needed for allergies.     . clobetasol (TEMOVATE) 0.05 % external  solution Apply 1 application topically 2 (two) times daily.     Marland Kitchen desonide (DESOWEN) 0.05 % cream Apply 1 application topically 2 (two) times daily as needed (dermatitis on face).     . fluocinonide ointment (LIDEX) 6.81 % Apply 1 application topically daily.    Mar[place] Kitchen gabapentin (NEURONTIN) 100 MG capsule Take 100-200 mg by mouth See admin instructions. 1 in am and 2 in the evening , and sometimes takes 1 capsule three times daily    . hydrochlorothiazide (HYDRODIURIL) 25 MG tablet TAKE ONE TABLET BY MOUTH ONCE DAILY 90 tablet 0  . Hypromellose (ARTIFICIAL TEARS OP) Place 1 drop into both eyes 2 (two) times daily.    Mar[place] Kitchen ketoconazole  (NIZORAL) 2 % shampoo Apply 1 application topically 3 (three) times a week.    . levothyroxine (SYNTHROID, LEVOTHROID) 137 MCG tablet Take 1 tablet (137 mcg total) by mouth daily. (Patient taking differently: Take 137 mcg by mouth daily before breakfast. ) 30 tablet 5  . lithium carbonate 300 MG capsule Take 900 mg by mouth daily with lunch.     . naproxen sodium (ANAPROX) 220 MG tablet Take 2 tablets (440 mg total) by mouth every 12 (twelve) hours as needed (pain). (Patient taking differently: Take 440 mg by mouth 3 (three) times daily as needed (pain). ) 20 tablet 0  . TRIAMCINOLONE & EMOLLIENT EX Apply 1 application topically daily. Triamcinolone eucerin    . levofloxacin (LEVAQUIN) 500 MG tablet Take 1 tablet (500 mg total) by mouth daily. (Patient not taking: Reported on 09/30/2016) 7 tablet 0    No results found for this or any previous visit (from the past 48 hour(s)). No results found.  Review of Systems  All other systems reviewed and are negative.   Height 5' 7.5" (1.715 m), weight 246 lb 14 oz (112 kg), last menstrual period 07/30/2013. Physical Exam  Examination patient has a good dorsalis pedis pulse she has good ankle motion she has limited subtalar motion and this reproduces pain. Palpation over the sinus Tarsi as well as the calcaneocuboid joint is painful. Patient cannot do a single limb heel raise. Assessment/Plan Assessment: Posterior tibial tendon insufficiency with pronated valgus forefoot with subtalar arthritis as well as calcaneocuboid arthritis.  Plan: We'll plan for triple arthrodesis. Risks and benefits were discussed including infection neurovascular injury persistent pain and need for additional surgery. Patient states she understands wishes to proceed at this time.  Newt Minion, MD 10/08/2016, 6:20 AM

## 2016-10-08 NOTE — Evaluation (Signed)
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 Patient is failed prolonged conservative therapy including bracing and patient presents at this time for triple arthrodesis. PMH: HTN, bipolar, fibromyalgia  Clinical Impression  Pt admitted with above diagnosis. Pt currently with functional limitations due to the deficits listed below (see PT Problem List). Pt moving well and able to keep RLE NWB with CAM boot. Needs to practice a few steps before discharge tomorrow.  Pt will benefit from skilled PT to increase their independence and safety with mobility to allow discharge to the venue listed below.       Follow Up Recommendations No PT follow up    Equipment Recommendations  Other (comment) (knee scooter)    Recommendations for Other Services       Precautions / Restrictions Precautions Precautions: None Required Braces or Orthoses: Other Brace/Splint Other Brace/Splint: R CAM boot Restrictions Weight Bearing Restrictions: Yes RLE Weight Bearing: Non weight bearing      Mobility  Bed Mobility Overal bed mobility: Modified Independent             General bed mobility comments: pt able to manage RLE out of bed   Transfers Overall transfer level: Needs assistance Equipment used: Rolling walker (2 wheeled) Transfers: Sit to/from [facility] Sit to Stand: Min guard Stand pivot transfers: Min guard       General transfer comment: vc's for hand placement, min-guard for safety  Ambulation/Gait Ambulation/Gait assistance: Min guard Ambulation Distance (Feet): 2 Feet Assistive device: Rolling walker (2 wheeled)   Gait velocity: decreased   General Gait Details: pt able to  hop on LLE and keep RLE NWB. Pt to ask husband to bring shoe for L foot for cushion and height  Stairs            Wheelchair Mobility    Modified Rankin (Stroke Patients Only)       Balance Overall balance assessment: No apparent balance deficits (not formally assessed)                                           Pertinent Vitals/Pain Pain Assessment: Faces Faces Pain Scale: Hurts even more Pain Location: R ankle Pain Descriptors / Indicators: Aching Pain Intervention(s): Limited activity within patient's tolerance;Monitored during session    Home Living Family/patient expects to be discharged to:: Private residence Living Arrangements: Spouse/significant other Available Help at Discharge: Family;Available PRN/intermittently Type of Home: House Home Access: Stairs to enter Entrance Stairs-Rails: None Entrance Stairs-Number of Steps: 2 Home Layout: One level Home Equipment: Environmental consultant - 2 wheels      Prior Function Level of Independence: Independent               Hand Dominance        Extremity/Trunk Assessment   Upper Extremity Assessment Upper Extremity Assessment: Overall WFL for tasks assessed    Lower Extremity Assessment Lower Extremity Assessment: RLE deficits/detail RLE Deficits / Details: hip and knee appear to be [facility] RLE: Unable to fully assess due to pain    Cervical / Trunk Assessment Cervical / Trunk Assessment: Normal  Communication   Communication: No difficulties  Cognition Arousal/Alertness: Awake/alert Behavior During  Therapy: WFL for tasks assessed/performed Overall Cognitive Status: Within Functional Limits for tasks assessed                                        General Comments      Exercises     Assessment/Plan    PT Assessment Patient needs continued PT services  PT Problem List Decreased range of motion;Decreased activity tolerance;Decreased mobility;Decreased knowledge of use of  DME;Decreased knowledge of precautions;Pain       PT Treatment Interventions DME instruction;Gait training;Stair training;Functional mobility training;Therapeutic activities;Therapeutic exercise;Patient/family education    PT Goals (Current goals can be found in the Care Plan section)  Acute Rehab PT Goals Patient Stated Goal: return home PT Goal Formulation: With patient Time For Goal Achievement: 10/15/16 Potential to Achieve Goals: Good    Frequency Min 4X/week   Barriers to discharge        Co-evaluation               AM-PAC PT "6 Clicks" Daily Activity  Outcome Measure Difficulty turning over in bed (including adjusting bedclothes, sheets and blankets)?: None Difficulty moving from lying on back to sitting on the side of the bed? : None Difficulty sitting down on and standing up from a chair with arms (e.g., wheelchair, bedside commode, etc,.)?: A Little Help needed moving to and from a bed to chair (including a wheelchair)?: A Little Help needed walking in hospital room?: A Little Help needed climbing 3-5 steps with a railing? : A Lot 6 Click Score: 19    End of Session Equipment Utilized During Treatment: Gait belt Activity Tolerance: Patient tolerated treatment well Patient left: in chair;with call bell/phone within reach Nurse Communication: Mobility status PT Visit Diagnosis: Difficulty in walking, not elsewhere classified (R26.2);Pain Pain - Right/Left: Right Pain - part of body: Ankle and joints of foot    Time: 1455-1520 PT Time Calculation (min) (ACUTE ONLY): 25 min   Charges:   PT Evaluation $PT Eval Low Complexity: 1 Procedure PT Treatments $Therapeutic Activity: 8-22 mins   PT G Codes:   PT G-Codes **NOT FOR INPATIENT CLASS** Functional Assessment Tool Used: AM-PAC 6 Clicks Basic Mobility Functional Limitation: Mobility: Walking and moving around Mobility: Walking and Moving Around Current Status (H8469): At least 20 percent but less than 40  percent impaired, limited or restricted Mobility: Walking and Moving Around Goal Status [phone]): At least 1 percent but less than 20 percent impaired, limited or restricted    [place], PT  Acute Rehab Services  [place] 10/08/2016, 4:09 PM

## 2016-10-08 NOTE — Anesthesia Procedure Notes (Signed)
Anesthesia Regional Block: Adductor canal block   Pre-Anesthetic Checklist: ,, timeout performed, Correct Patient, Correct Site, Correct Laterality, Correct Procedure, Correct Position, site marked, Risks and benefits discussed,  Surgical consent,  Pre-op evaluation,  At surgeon's request and post-op pain management  Laterality: Right  Prep: chloraprep       Needles:   Needle Type: Stimulator Needle - 80          Additional Needles:   Procedures: Doppler guided, Ultrasound guided,,,,,,  Narrative:  Start time: 10/08/2016 6:55 AM End time: 10/08/2016 7:15 AM Injection made incrementally with aspirations every 5 mL.  Performed by: Personally  Anesthesiologist: Belinda Block

## 2016-10-08 NOTE — Anesthesia Postprocedure Evaluation (Signed)
Anesthesia Post Note  Patient: Leah Powers  Procedure(s) Performed: Procedure(s) (LRB): RIGHT TRIPLE ARTHRODESIS (Right)     Patient location during evaluation: PACU Anesthesia Type: General Level of consciousness: awake Pain management: pain level controlled Respiratory status: spontaneous breathing Postop Assessment: no signs of nausea or vomiting Anesthetic complications: no    Last Vitals:  Vitals:   10/08/16 0935 10/08/16 0945  BP: (!) 152/81   Pulse: 69 70  Resp: 10 15  Temp:  36.7 C    Last Pain:  Vitals:   10/08/16 0930  TempSrc:   PainSc: [name]

## 2016-10-08 NOTE — Anesthesia Procedure Notes (Signed)
Procedure Name: LMA Insertion Date/Time: 10/08/2016 7:32 AM Performed by: Melina Copa, Lus Kriegel R Pre-anesthesia Checklist: Patient identified, Emergency Drugs available, Suction available and Patient being monitored Patient Re-evaluated:Patient Re-evaluated prior to induction Oxygen Delivery Method: Circle System Utilized Preoxygenation: Pre-oxygenation with 100% oxygen Induction Type: IV induction Ventilation: Mask ventilation without difficulty LMA: LMA inserted LMA Size: 5.0 Number of attempts: 1 Placement Confirmation: positive ETCO2 Tube secured with: Tape Dental Injury: Teeth and Oropharynx as per pre-operative assessment

## 2016-10-08 NOTE — Progress Notes (Signed)
Orthopedic Tech Progress Note Patient Details:  Leah Powers [birth year] [id number]  Ortho Devices Type of Ortho Device: CAM walker Ortho Device/Splint Location: rle Ortho Device/Splint Interventions: Application   Danyel Griess 10/08/2016, 10:35 AM

## 2016-10-08 NOTE — Transfer of Care (Signed)
Immediate Anesthesia Transfer of Care Note  Patient: Leah Powers  Procedure(s) Performed: Procedure(s): RIGHT TRIPLE ARTHRODESIS (Right)  Patient Location: PACU  Anesthesia Type:General  Level of Consciousness: awake, oriented and patient cooperative  Airway & Oxygen Therapy: Patient Spontanous Breathing and Patient connected to nasal cannula oxygen  Post-op Assessment: Report given to RN, Post -op Vital signs reviewed and stable and Patient moving all extremities  Post vital signs: Reviewed and stable  Last Vitals:  Vitals:   10/08/16 0631  BP: 125/65  Pulse: 66  Resp: 20  Temp: 37 C    Last Pain:  Vitals:   10/08/16 0631  TempSrc: Oral  PainSc:       Patients Stated Pain Goal: 2 (18/98/42 1031)  Complications: No apparent anesthesia complications

## 2016-10-08 NOTE — Progress Notes (Signed)
Report given to robin roberts rn as caregiver 

## 2016-10-08 NOTE — Anesthesia Preprocedure Evaluation (Addendum)
Anesthesia Evaluation  Patient identified by MRN, date of birth, ID band Patient awake    Reviewed: Allergy & Precautions, NPO status , Patient's Chart, lab work & pertinent test results  Airway Mallampati: II  TM Distance: >3 FB     Dental  (+) Teeth Intact, Dental Advisory Given   Pulmonary pneumonia,    breath sounds clear to auscultation       Cardiovascular hypertension, Pt. on medications  Rhythm:Regular Rate:Normal     Neuro/Psych    GI/Hepatic negative GI ROS, Neg liver ROS,   Endo/Other  Hypothyroidism   Renal/GU      Musculoskeletal  (+) Arthritis , Fibromyalgia -  Abdominal   Peds  Hematology   Anesthesia Other Findings   Reproductive/Obstetrics                            Anesthesia Physical Anesthesia Plan  ASA: III  Anesthesia Plan: General   Post-op Pain Management:    Induction: Intravenous  PONV Risk Score and Plan: 3 and Ondansetron, Dexamethasone, Propofol and Midazolam  Airway Management Planned: LMA  Additional Equipment:   Intra-op Plan:   Post-operative Plan: Extubation in OR  Informed Consent: I have reviewed the patients History and Physical, chart, labs and discussed the procedure including the risks, benefits and alternatives for the proposed anesthesia with the patient or authorized representative who has indicated his/her understanding and acceptance.   Dental advisory given  Plan Discussed with: CRNA, Anesthesiologist and Surgeon  Anesthesia Plan Comments:        Anesthesia Quick Evaluation

## 2016-10-08 NOTE — Op Note (Signed)
10/08/2016  8:46 AM  PATIENT:  Leah Powers    PRE-OPERATIVE DIAGNOSIS:  Right Posterior Tibial Tendon Insufficiency   POST-OPERATIVE DIAGNOSIS:  Same  PROCEDURE:  RIGHT TRIPLE ARTHRODESIS  SURGEON:  Newt Minion, MD  PHYSICIAN ASSISTANT:None ANESTHESIA:   General  PREOPERATIVE INDICATIONS:  Leah Powers is a  56 y.o. female with a diagnosis of Right Posterior Tibial Tendon Insufficiency  who failed conservative measures and elected for surgical management.    The risks benefits and alternatives were discussed with the patient preoperatively including but not limited to the risks of infection, bleeding, nerve injury, cardiopulmonary complications, the need for revision surgery, among others, and the patient was willing to proceed.  OPERATIVE IMPLANTS: Synthes staples 2 headless cannulated screw times one 6.5 mm  OPERATIVE FINDINGS: Hard sclerotic bone with synovitis of the peroneal tendons and sclerosis of the calcaneocuboid subtalar and talonavicular joints.  OPERATIVE PROCEDURE: Patient was brought to the operating room and underwent a general anesthetic. After adequate levels anesthesia obtained patient's right lower extremity was prepped using DuraPrep draped into a sterile field a timeout was called. An oblique incision was made over the sinus Tarsi this was carried down to the subtalar joint. Retractors were placed to protect the peroneal tendons. Using a bur the subtalar posterior facet was debrided sclerotic articular cartilage. The calcaneocuboid joint was also debrided of sclerotic articular cartilage. A separate incision was then made over the talonavicular joint. Blunt dissection was carried down to the joint. C-arm fluoroscopy was used to verify location. A bur was used to debride the articular cartilage from the talonavicular joint which was also sclerotic. The pronation and valgus was reduced a staple was placed across the talonavicular joint and the joint wasn't was packed  with demineralized bone matrix. C-arm floss be verified alignment. A second staple was then used to stabilize the calcaneocuboid joint again demineralized bone matrix was placed within the joint. The subtalar joint was then filled with demineralized bone matrix and a guidewire was placed in the calcaneus to the talus. C-arm floss be verified alignment and this was secured with a 75 mm 6.5 headless cannulated screw. C-arm fluoroscopy verified alignment of the foot. The wounds were irrigated normal saline hemostasis was obtained the incision was closed using 2-0 nylon a sterile dressing was applied patient was extubated taken to PACU in stable condition.

## 2016-10-09 ENCOUNTER — Encounter (HOSPITAL_COMMUNITY): Payer: Self-pay | Admitting: Orthopedic Surgery

## 2016-10-09 ENCOUNTER — Telehealth (INDEPENDENT_AMBULATORY_CARE_PROVIDER_SITE_OTHER): Payer: Self-pay | Admitting: Radiology

## 2016-10-09 DIAGNOSIS — M76821 Posterior tibial tendinitis, right leg: Secondary | ICD-10-CM | POA: Diagnosis not present

## 2016-10-09 MED ORDER — OXYCODONE-ACETAMINOPHEN 5-325 MG PO TABS: 1.0000 | ORAL_TABLET | ORAL | 0 refills | Status: DC | PRN

## 2016-10-09 NOTE — Telephone Encounter (Signed)
Patient came in office to ask for a Rx for a knee scooter, Rx given.

## 2016-10-09 NOTE — Discharge Summary (Signed)
Discharge Diagnoses:  Active Problems:   Posterior tibial tendinitis, right leg   Posterior tibial tendon dysfunction (PTTD) of right lower extremity   Surgeries: Procedure(s): RIGHT TRIPLE ARTHRODESIS on 10/08/2016    Consultants: none  Discharged Condition: Improved  Hospital Course: Leah Powers is an 56 y.o. female who was admitted 10/08/2016 with a chief complaint of posterior tibial tendon insufficiency on the right, with a final diagnosis of Right Posterior Tibial Tendon Insufficiency .  Patient was brought to the operating room on 10/08/2016 and underwent Procedure(s): RIGHT TRIPLE ARTHRODESIS.    Patient was given perioperative antibiotics: Anti-infectives    Start     Dose/Rate Route Frequency Ordered Stop   10/08/16 1330  clindamycin (CLEOCIN) IVPB 600 mg     600 mg 100 mL/hr over 30 Minutes Intravenous Every 6 hours 10/08/16 1010 10/09/16 0226   10/08/16 0544  clindamycin (CLEOCIN) IVPB 900 mg     900 mg 100 mL/hr over 30 Minutes Intravenous On call to O.R. 10/08/16 0102 10/08/16 0739    .  Patient was given sequential compression devices, early ambulation, and aspirin for DVT prophylaxis.  Recent vital signs: Patient Vitals for the past 24 hrs:  BP Temp Temp src Pulse Resp SpO2  10/09/16 0453 (!) 131/54 98.3 F (36.8 C) Oral 71 17 99 %  10/08/16 2005 (!) 120/48 98.3 F (36.8 C) Oral 83 16 98 %  10/08/16 1500 (!) 120/56 98 F (36.7 C) Oral 70 16 98 %  10/08/16 1130 138/68 - - - - -  10/08/16 1100 129/60 98 F (36.7 C) Oral 62 16 98 %  10/08/16 0945 - 98 F (36.7 C) - 70 15 96 %  10/08/16 0935 (!) 152/81 - - 69 10 98 %  10/08/16 0920 (!) 152/85 - - 69 15 99 %  10/08/16 0900 136/78 - - 79 14 100 %  10/08/16 0846 (!) 145/90 97.6 F (36.4 C) - 88 18 95 %  .  Recent laboratory studies: No results found.  Discharge Medications:   Allergies as of 10/09/2016      Reactions   Eucalyptus Oil Shortness Of Breath   Haldol [haloperidol Decanoate] Other (See  Comments)   Drooling, weaving in floor, parkinson's syndrome (couldn't eat or talk) , pt was hospitalized    Molds & Smuts Shortness Of Breath, Itching   Other Anaphylaxis, Shortness Of Breath, Itching, Swelling   Bleu cheese    Penicillins Anaphylaxis, Rash   Pt was 56 years old  Has patient had a PCN reaction causing immediate rash, facial/tongue/throat swelling, SOB or lightheadedness with hypotension: Yes Has patient had a PCN reaction causing severe rash involving mucus membranes or skin necrosis: Unknown Has patient had a PCN reaction that required hospitalization: No Has patient had a PCN reaction occurring within the last 10 years: No If all of the above answers are "NO", then may proceed with Cephalosporin use.   Bee Venom Other (See Comments)   Yellow jackets and wasps-stung by 66, MD told pt that she wouldn't have resistance if stung again    Corn-containing Products Nausea And Vomiting, Other (See Comments)   Stomach distress    Demeclocycline Other (See Comments)   Dolobid [diflunisal] Other (See Comments)   Upset stomach   Vicodin [hydrocodone-acetaminophen] Diarrhea, Other (See Comments)   Upset stomach   Doxycycline Rash   Erythromycin Rash   Keflex [cephalexin] Rash   Macrodantin Rash   Motrin [ibuprofen] Palpitations   Septra [bactrim] Rash   Tetracyclines &  Related Rash   Tofranil-pm Rash      Medication List    TAKE these medications   ARTIFICIAL TEARS OP Place 1 drop into both eyes 2 (two) times daily.   benazepril 10 MG tablet Commonly known as:  LOTENSIN TAKE ONE TABLET BY MOUTH ONCE DAILY   cetirizine 5 MG tablet Commonly known as:  ZYRTEC Take 5 mg by mouth 2 (two) times daily as needed for allergies.   clobetasol 0.05 % external solution Commonly known as:  TEMOVATE Apply 1 application topically 2 (two) times daily.   desonide 0.05 % cream Commonly known as:  DESOWEN Apply 1 application topically 2 (two) times daily as needed (dermatitis on  face).   fluocinonide ointment 0.05 % Commonly known as:  LIDEX Apply 1 application topically daily.   gabapentin 100 MG capsule Commonly known as:  NEURONTIN Take 100-200 mg by mouth See admin instructions. 1 in am and 2 in the evening , and sometimes takes 1 capsule three times daily   hydrochlorothiazide 25 MG tablet Commonly known as:  HYDRODIURIL TAKE ONE TABLET BY MOUTH ONCE DAILY   ketoconazole 2 % shampoo Commonly known as:  NIZORAL Apply 1 application topically 3 (three) times a week.   levofloxacin 500 MG tablet Commonly known as:  LEVAQUIN Take 1 tablet (500 mg total) by mouth daily.   levothyroxine 137 MCG tablet Commonly known as:  SYNTHROID, LEVOTHROID Take 1 tablet (137 mcg total) by mouth daily. What changed:  when to take this   lithium carbonate 300 MG capsule Take 900 mg by mouth daily with lunch.   naproxen sodium 220 MG tablet Commonly known as:  ANAPROX Take 2 tablets (440 mg total) by mouth every 12 (twelve) hours as needed (pain). What changed:  when to take this   oxyCODONE-acetaminophen 5-325 MG tablet Commonly known as:  ROXICET Take 1 tablet by mouth every 4 (four) hours as needed for severe pain.   TRIAMCINOLONE & EMOLLIENT EX Apply 1 application topically daily. Triamcinolone eucerin       Diagnostic Studies: No results found.  Patient benefited maximally from their hospital stay and there were no complications.     Disposition: 01-Home or Self Care Discharge Instructions    Call MD / Call 911    Complete by:  As directed    If you experience chest pain or shortness of breath, CALL 911 and be transported to the hospital emergency room.  If you develope a fever above 101 F, pus (white drainage) or increased drainage or redness at the wound, or calf pain, call your surgeon's office.   Constipation Prevention    Complete by:  As directed    Drink plenty of fluids.  Prune juice may be helpful.  You may use a stool softener, such as  Colace (over the counter) 100 mg twice a day.  Use MiraLax (over the counter) for constipation as needed.   Diet - low sodium heart healthy    Complete by:  As directed    Elevate operative extremity    Complete by:  As directed    Ice pack    Complete by:  As directed    Increase activity slowly as tolerated    Complete by:  As directed    Non weight bearing    Complete by:  As directed    Laterality:  right   Extremity:  Lower     Follow-up Information    Newt Minion, MD Follow up in  1 week(s).   Specialty:  Orthopedic Surgery Contact information: [address] [place] [address] [phone]            Signed: Newt Minion 10/09/2016, 7:06 AM

## 2016-10-09 NOTE — Progress Notes (Signed)
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 Patient is failed prolonged conservative therapy including bracing and patient presents at this time for triple arthrodesis. PMH: HTN, bipolar, fibromyalgia    PT Comments    This session focused on gait with knee scooter, transfers, and stair negotiation. Pt able to maintain NWB status during session and demonstrated safe use of DME. Current plan remains appropriate.   Follow Up Recommendations  No PT follow up     Equipment Recommendations  Other (comment) (knee scooter)    Recommendations for Other Services       Precautions / Restrictions Precautions Precautions: None Required Braces or Orthoses: Other Brace/Splint Other Brace/Splint: R CAM boot Restrictions Weight Bearing Restrictions: Yes RLE Weight Bearing: Non weight bearing    Mobility  Bed Mobility Overal bed mobility: Modified Independent             General bed mobility comments: increased effort  Transfers Overall transfer level: Needs assistance Equipment used: Rolling walker (2 wheeled) Transfers: Sit to/from [facility] Sit to Stand: Min guard Stand pivot transfers: Min guard       General transfer comment: min guard for safety; cues for technique; pt transferred several times from EOB, chair without arms, recliner, and commode with grab bars  Ambulation/Gait Ambulation/Gait assistance: Min guard Ambulation Distance (Feet): 120 Feet Assistive device: Rolling walker (2 wheeled) (knee scooter) Gait Pattern/deviations: Step-to pattern Gait velocity: decreased   General Gait Details: pt ambulated short distances with RW and to/from ortho gym with knee  scooter; pt educated on safe use of knee scooter and demonstrated safe use; cues for maintaining NWB status   Stairs Stairs: Yes   Stair Management: No rails Number of Stairs: 1 General stair comments: pt has single step and then a landing at home; practiced stepping backward toward step and sitting on chair and turning to enter home as pt unable to "hop" up step with RW  Wheelchair Mobility    Modified Rankin (Stroke Patients Only)       Balance Overall balance assessment: No apparent balance deficits (not formally assessed)                                          Cognition Arousal/Alertness: Awake/alert Behavior During Therapy: Anxious Overall Cognitive Status: Within Functional Limits for tasks assessed                                        Exercises      General Comments        Pertinent Vitals/Pain Pain Assessment: Faces Faces Pain Scale: Hurts a little bit Pain Location: R ankle Pain Descriptors / Indicators: Sore Pain Intervention(s): Monitored during session;Repositioned;Patient requesting pain meds-RN notified    Home Living                      Prior Function            PT Goals (current goals can now be found in the care plan section) Acute Rehab PT Goals Patient Stated Goal: return home  PT Goal Formulation: With patient Time For Goal Achievement: 10/15/16 Potential to Achieve Goals: Good Progress towards PT goals: Progressing toward goals    Frequency    Min 4X/week      PT Plan Current plan remains appropriate    Co-evaluation              AM-PAC PT "6 Clicks" Daily Activity  Outcome Measure  Difficulty turning over in bed (including adjusting bedclothes, sheets and blankets)?: None Difficulty moving from lying on back to sitting on the side of the bed? : None Difficulty sitting down on and standing up from a chair with arms (e.g., wheelchair, bedside commode, etc,.)?: A  Little Help needed moving to and from a bed to chair (including a wheelchair)?: A Little Help needed walking in hospital room?: A Little Help needed climbing 3-5 steps with a railing? : A Lot 6 Click Score: 19    End of Session Equipment Utilized During Treatment: Gait belt Activity Tolerance: Patient tolerated treatment well Patient left: in chair;with call bell/phone within reach Nurse Communication: Mobility status PT Visit Diagnosis: Difficulty in walking, not elsewhere classified (R26.2);Pain Pain - Right/Left: Right Pain - part of body: Ankle and joints of foot     Time: 0277-4128 PT Time Calculation (min) (ACUTE ONLY): 42 min  Charges:  $Gait Training: 8-22 mins $Therapeutic Activity: 23-37 mins                    G Codes:       Earney Navy, PTA Pager: [phone]     Darliss Cheney 10/09/2016, 11:02 AM

## 2016-10-09 NOTE — Progress Notes (Signed)
Charge RN assisted primary RN with pt's discharge. Pt met PT goals, and will be renting knee scooter per CM. Discharge instructions reviewed with pt and family member, all questions answered. Pt's PIV removed. Belongings will be sent with pt. Her percocet prescription was not signed by Dr. Sharol Given. His office was contacted, and the pt will bring it by the office after d/c.  [place], Jerry Caras

## 2016-10-09 NOTE — Care Management Note (Signed)
Case Management Note  Patient Details  Name: Leah Powers MRN: [id number] Date of Birth: 08/18/1960  Subjective/Objective:    Posterior tibial tendon dysfunction or right lower extremity                Action/Plan: Discharge Planning: NCM spoke to pt and husband at home to assist with care. Pt will need a knee scooter. Provided information on [employer] supplies. They do rent for $20 per week and $70 per month. Has RW at home.   PCP [facility]     Expected Discharge Date:  10/09/16               Expected Discharge Plan:  Home/Self Care  In-House Referral:  NA  Discharge planning Services  CM Consult  Post Acute Care Choice:  NA Choice offered to:  NA  DME Arranged:  Other see comment DME Agency:  Other - Comment  HH Arranged:  NA HH Agency:  NA  Status of Service:  Completed, signed off  If discussed at [place] of Stay Meetings, dates discussed:    Additional Comments:  Erenest Rasher, RN 10/09/2016, 8:41 AM

## 2016-10-15 ENCOUNTER — Ambulatory Visit (INDEPENDENT_AMBULATORY_CARE_PROVIDER_SITE_OTHER): Payer: BLUE CROSS/BLUE SHIELD | Admitting: Orthopedic Surgery

## 2016-10-15 DIAGNOSIS — Z981 Arthrodesis status: Secondary | ICD-10-CM

## 2016-10-15 NOTE — Progress Notes (Signed)
   Post-Op Visit Note   Patient: Leah Powers           Date of Birth: 30-Dec-1960           MRN: [id number] Visit Date: 10/15/2016 PCP: [facility]  Chief Complaint: No chief complaint on file.   HPI:  The patient is a 56 year old woman who presents 1 week status post right triple arthrodesis on July 25. Using a kneeling scooter is non-weightbearing.    Ortho Exam Incision is well approximated with sutures no gaping no drainage. Does have moderate swelling and mild erythema no cellulitis.  Visit Diagnoses:  1. H/O ankle fusion     Plan: Begin daily Dial soap cleansing. Apply dry dressings. Work on elevation and nonweightbearing. Follow-up in 1 week with radiographs of the ankle consider removal sutures  Follow-Up Instructions: Return in about 1 week (around 10/22/2016).   Imaging: No results found.  Orders:  No orders of the defined types were placed in this encounter.  No orders of the defined types were placed in this encounter.    PMFS History: Patient Active Problem List   Diagnosis Date Noted  . Posterior tibial tendon dysfunction (PTTD) of right lower extremity 10/08/2016  . Posterior tibial tendinitis, right leg   . HTN (hypertension) 05/31/2014  . Hypothyroidism 05/31/2014   Past Medical History:  Diagnosis Date  . Arthritis    L hand, R ankle  (10/08/2016)  . Bipolar 1 disorder ([place])   . Fibromyalgia    told that the breakout on the upper back , could be over active nerves , also treated with cream & Gabapentin[facility] [place]   . History of kidney stones    spontaneous passing  . Hypertension   . Hypothyroid   . Pneumonia ~ 1967; 07/2016  . Pyelonephritis 1997   treated with IV antibiotic     Family History  Problem Relation Age of Onset  . Breast cancer Maternal Aunt   . Breast cancer Maternal Grandmother     Past Surgical History:  Procedure Laterality Date  . BLADDER INSTILLATION  X 2  .  CHALAZION EXCISION Bilateral    R- eye X2 , L eye- X1  . FOOT ARTHRODESIS Right 10/08/2016   Procedure: RIGHT TRIPLE ARTHRODESIS;  Surgeon: Newt Minion, MD;  Location: [facility];  Service: Orthopedics;  Laterality: Right;  . FOOT ARTHRODESIS, TRIPLE Right 10/08/2016   ankle  . FRENULECTOMY, LINGUAL  1970s  . LAPAROSCOPIC CHOLECYSTECTOMY  1996  . PALATE SURGERY  1970s   tissue removed from upper palate as a child   . URETHRAL DILATION     Social History   Occupational History  . Not on file.   Social History Main Topics  . Smoking status: Never Smoker  . Smokeless tobacco: Never Used  . Alcohol use No  . Drug use: No  . Sexual activity: Not Currently

## 2016-10-16 ENCOUNTER — Telehealth (INDEPENDENT_AMBULATORY_CARE_PROVIDER_SITE_OTHER): Payer: Self-pay | Admitting: Orthopedic Surgery

## 2016-10-16 NOTE — Telephone Encounter (Signed)
Patient called needing to know she should do anything to care for the WD other than clean it with dial soap and how often to clean it. The number to contact patient is [phone]

## 2016-10-17 NOTE — Telephone Encounter (Signed)
I called pt and advised to clean with dial soap and water and to keep covered with a dressing a dry dressing elevate and non weight bearing. Pt voiced understanding and will call with questions.

## 2016-10-22 ENCOUNTER — Ambulatory Visit (INDEPENDENT_AMBULATORY_CARE_PROVIDER_SITE_OTHER): Payer: BLUE CROSS/BLUE SHIELD | Admitting: Orthopedic Surgery

## 2016-10-22 ENCOUNTER — Encounter (INDEPENDENT_AMBULATORY_CARE_PROVIDER_SITE_OTHER): Payer: Self-pay | Admitting: Orthopedic Surgery

## 2016-10-22 DIAGNOSIS — Z981 Arthrodesis status: Secondary | ICD-10-CM

## 2016-10-23 ENCOUNTER — Telehealth (INDEPENDENT_AMBULATORY_CARE_PROVIDER_SITE_OTHER): Payer: Self-pay

## 2016-10-23 ENCOUNTER — Telehealth (INDEPENDENT_AMBULATORY_CARE_PROVIDER_SITE_OTHER): Payer: Self-pay | Admitting: *Deleted

## 2016-10-23 NOTE — Telephone Encounter (Signed)
error 

## 2016-10-23 NOTE — Progress Notes (Signed)
Office Visit Note   Patient: Leah Powers           Date of Birth: Jul 12, 1960           MRN: [id number] Visit Date: 10/22/2016              Requested by: [facility] [address] [place], [place] [address] PCP: [place]  Chief Complaint  Patient presents with  . Right Ankle - Routine Post Op    10/08/16 Right Triple Arthrodesis 2 weeks post op.       HPI: Patient presents 2 weeks status post triple arthrodesis on the right for posterior tibial tendon insufficiency. Patient is currently nonweightbearing in a wheelchair.  Assessment & Plan: Visit Diagnoses:  1. H/O ankle fusion     Plan: Continue nonweightbearing she'll work on range of motion of the ankle  Recommended knee-high medical compression stockings 15-20 mm of compression..  Follow-up in 3 weeks with 3 view radiographs of the right foot.  Follow-Up Instructions: Return in about 3 weeks (around 11/12/2016).   Ortho Exam  Patient is alert, oriented, no adenopathy, well-dressed, normal affect, normal respiratory effort. Examination incisions are well-healed there is no cellulitis no drainage no signs of infection. She will have the sutures harvested today she was given instructions for scar massage.  Imaging: No results found.  Labs: Lab Results  Component Value Date   HGBA1C 5.9 (H) 05/26/2014   GRAMSTAIN No WBC Seen 06/13/2013   GRAMSTAIN No Squamous Epithelial Cells Seen 06/13/2013   GRAMSTAIN No Organisms Seen 06/13/2013   LABORGA Multiple Organisms Present,None Predominant 06/13/2013    Orders:  No orders of the defined types were placed in this encounter.  No orders of the defined types were placed in this encounter.    Procedures: No procedures performed  Clinical Data: No additional findings.  ROS:  All other systems negative, except as noted in the HPI. Review of Systems  Objective: Vital Signs: LMP  07/30/2013   Specialty Comments:  No specialty comments available.  PMFS History: Patient Active Problem List   Diagnosis Date Noted  . Posterior tibial tendon dysfunction (PTTD) of right lower extremity 10/08/2016  . Posterior tibial tendinitis, right leg   . HTN (hypertension) 05/31/2014  . Hypothyroidism 05/31/2014   Past Medical History:  Diagnosis Date  . Arthritis    L hand, R ankle  (10/08/2016)  . Bipolar 1 disorder ([place])   . Fibromyalgia    told that the breakout on the upper back , could be over active nerves , also treated with cream & [facility] [place]   . History of kidney stones    spontaneous passing  . Hypertension   . Hypothyroid   . Pneumonia ~ 1967; 07/2016  . Pyelonephritis 1997   treated with IV antibiotic     Family History  Problem Relation Age of Onset  . Breast cancer Maternal Aunt   . Breast cancer Maternal Grandmother     Past Surgical History:  Procedure Laterality Date  . BLADDER INSTILLATION  X 2  . CHALAZION EXCISION Bilateral    R- eye X2 , L eye- X1  . FOOT ARTHRODESIS Right 10/08/2016   Procedure: RIGHT TRIPLE ARTHRODESIS;  Surgeon: Newt Minion, MD;  Location: [facility];  Service: Orthopedics;  Laterality: Right;  . FOOT ARTHRODESIS, TRIPLE Right 10/08/2016   ankle  . FRENULECTOMY, LINGUAL  1970s  . LAPAROSCOPIC CHOLECYSTECTOMY  [place]  1970s   tissue removed from upper palate as a child   . URETHRAL DILATION     Social History   Occupational History  . Not on file.   Social History Main Topics  . Smoking status: Never Smoker  . Smokeless tobacco: Never Used  . Alcohol use No  . Drug use: No  . Sexual activity: Not Currently

## 2016-10-23 NOTE — Telephone Encounter (Signed)
Patient husband called stating she is having some drainage from her incision, wondering if this is "normal" at this point? Did not c/o warmth or redness.

## 2016-10-23 NOTE — Telephone Encounter (Signed)
I called and left voicemail, patient has significant amount of swelling at office visit. She needs to be nonweightbearing, leg elevated at level of heart, and wear compression stocking. Advised due to the amount of swelling drainage would be normal. Advised if she develops fever, warmth, redness or chills to call to be worked into the schedule but conservative this will improve with elevation and compression.

## 2016-11-11 DIAGNOSIS — H903 Sensorineural hearing loss, bilateral: Secondary | ICD-10-CM | POA: Insufficient documentation

## 2016-11-12 ENCOUNTER — Ambulatory Visit (INDEPENDENT_AMBULATORY_CARE_PROVIDER_SITE_OTHER): Payer: BLUE CROSS/BLUE SHIELD

## 2016-11-12 ENCOUNTER — Ambulatory Visit (INDEPENDENT_AMBULATORY_CARE_PROVIDER_SITE_OTHER): Payer: BLUE CROSS/BLUE SHIELD | Admitting: Orthopedic Surgery

## 2016-11-12 DIAGNOSIS — Z981 Arthrodesis status: Secondary | ICD-10-CM

## 2016-11-12 MED ORDER — CLINDAMYCIN HCL 150 MG PO CAPS: 150.0000 mg | ORAL_CAPSULE | Freq: Three times a day (TID) | ORAL | 0 refills | Status: DC

## 2016-11-12 NOTE — Progress Notes (Signed)
Office Visit Note   Patient: Leah Powers           Date of Birth: 1960-08-23           MRN: [id number] Visit Date: 11/12/2016              Requested by: [facility] [address] [place], [place] [address] PCP: [facility]  No chief complaint on file.     HPI: Patient presents 4 weeks status post triple arthrodesis on the right for posterior tibial tendon insufficiency. Patient is currently nonweightbearing with kneeling scooter.   Assessment & Plan: Visit Diagnoses:  1. S/P ankle fusion     Plan: Continue she'll work on range of motion of the ankle. May begin touch down weight bearing in fracture boot.   Recommended she resume her knee-high medical compression stockings 15-20 mm of compression..  Follow-up in 3 weeks with 3 view radiographs of the right foot.  Follow-Up Instructions: Return in about 2 weeks (around 11/26/2016).   Ortho Exam  Patient is alert, oriented, no adenopathy, well-dressed, normal affect, normal respiratory effort. Examination of lateral and plantar incision, are well-healed there is no cellulitis no drainage no signs of infection. The dorsal infection has two open areas. Are 5 mm long and 2 mm wide. No drainage or surrounding erythema. Exudative tissue in wound bed.   Imaging: No results found.  Labs: Lab Results  Component Value Date   HGBA1C 5.9 (H) 05/26/2014   GRAMSTAIN No WBC Seen 06/13/2013   GRAMSTAIN No Squamous Epithelial Cells Seen 06/13/2013   GRAMSTAIN No Organisms Seen 06/13/2013   LABORGA Multiple Organisms Present,None Predominant 06/13/2013    Orders:  Orders Placed This Encounter  Procedures  . XR Ankle Complete Right   No orders of the defined types were placed in this encounter.    Procedures: No procedures performed  Clinical Data: No additional findings.  ROS:  All other systems negative, except as noted in the HPI. Review of  Systems  Musculoskeletal: Positive for arthralgias and joint swelling.    Objective: Vital Signs: LMP 07/30/2013   Specialty Comments:  No specialty comments available.  PMFS History: Patient Active Problem List   Diagnosis Date Noted  . S/P ankle fusion 11/12/2016  . Posterior tibial tendon dysfunction (PTTD) of right lower extremity 10/08/2016  . Posterior tibial tendinitis, right leg   . HTN (hypertension) 05/31/2014  . Hypothyroidism 05/31/2014   Past Medical History:  Diagnosis Date  . Arthritis    L hand, R ankle  (10/08/2016)  . Bipolar 1 disorder ([place])   . Fibromyalgia    told that the breakout on the upper back , could be over active nerves , also treated with cream & [facility]entin[facility] [place]   . History of kidney stones    spontaneous passing  . Hypertension   . Hypothyroid   . Pneumonia ~ 1967; 07/2016  . Pyelonephritis 1997   treated with IV antibiotic     Family History  Problem Relation Age of Onset  . Breast cancer Maternal Aunt   . Breast cancer Maternal Grandmother     Past Surgical History:  Procedure Laterality Date  . BLADDER INSTILLATION  X 2  . CHALAZION EXCISION Bilateral    R- eye X2 , L eye- X1  . FOOT ARTHRODESIS Right 10/08/2016   Procedure: RIGHT TRIPLE ARTHRODESIS;  Surgeon: Newt Minion, MD;  Location: [facility];  Service:  Orthopedics;  Laterality: Right;  . FOOT ARTHRODESIS, TRIPLE Right 10/08/2016   ankle  . FRENULECTOMY, LINGUAL  1970s  . LAPAROSCOPIC CHOLECYSTECTOMY  1996  . PALATE SURGERY  1970s   tissue removed from upper palate as a child   . URETHRAL DILATION     Social History   Occupational History  . Not on file.   Social History Main Topics  . Smoking status: Never Smoker  . Smokeless tobacco: Never Used  . Alcohol use No  . Drug use: No  . Sexual activity: Not Currently

## 2016-12-04 ENCOUNTER — Ambulatory Visit (INDEPENDENT_AMBULATORY_CARE_PROVIDER_SITE_OTHER): Payer: BLUE CROSS/BLUE SHIELD | Admitting: Orthopedic Surgery

## 2016-12-04 ENCOUNTER — Ambulatory Visit (INDEPENDENT_AMBULATORY_CARE_PROVIDER_SITE_OTHER): Payer: BLUE CROSS/BLUE SHIELD

## 2016-12-04 DIAGNOSIS — Z981 Arthrodesis status: Secondary | ICD-10-CM

## 2016-12-04 DIAGNOSIS — M76821 Posterior tibial tendinitis, right leg: Secondary | ICD-10-CM

## 2016-12-04 NOTE — Progress Notes (Signed)
Office Visit Note   Patient: Leah Powers           Date of Birth: 1960-10-11           MRN: [id number] Visit Date: 12/04/2016              Requested by: [facility] [address] [place], [place] [address] PCP: [facility]  Chief Complaint  Patient presents with  . Right Foot - Routine Post Op    Triple arthrodesis 10/08/16      HPI:  patient presents 2 months status post triple arthrodesis on the right. Patient states that she use to have severe pain in her foot and she states she now only has a little bit of soreness. She states the swelling has decreased a little and she can now wear her new balance walking sneakers she is using a walker.  Assessment & Plan: Visit Diagnoses:  1. S/P ankle fusion   2. Posterior tibial tendon dysfunction (PTTD) of right lower extremity     Plan:  Recommend continuing with her regular shoewear continue with the walker recommended that she wear the knee-high compression stockings around-the-clock.  Follow-Up Instructions: Return in about 3 weeks (around 12/25/2016).   Ortho Exam  Patient is alert, oriented, no adenopathy, well-dressed, normal affect, normal respiratory effort.  examination the surgical incisions are well healed there is significant amount of swelling in her foot and ankle but there is no redness no cellulitis no tenderness to palpation. Patient also has venous stasis swelling in the leg.  Imaging: Xr Foot Complete Right  Result Date: 12/04/2016  Three-view radiographs of the left foot shows  The calcaneocuboid staple has broken. The remainder the hardware is intact. No change in alignment  No images are attached to the encounter.  Labs: Lab Results  Component Value Date   HGBA1C 5.9 (H) 05/26/2014   GRAMSTAIN No WBC Seen 06/13/2013   GRAMSTAIN No Squamous Epithelial Cells Seen 06/13/2013   GRAMSTAIN No Organisms Seen 06/13/2013   LABORGA  Multiple Organisms Present,None Predominant 06/13/2013    Orders:  Orders Placed This Encounter  Procedures  . XR Foot Complete Right   No orders of the defined types were placed in this encounter.    Procedures: No procedures performed  Clinical Data: No additional findings.  ROS:  All other systems negative, except as noted in the HPI. Review of Systems  Objective: Vital Signs: LMP 07/30/2013   Specialty Comments:  No specialty comments available.  PMFS History: Patient Active Problem List   Diagnosis Date Noted  . S/P ankle fusion 11/12/2016  . Posterior tibial tendon dysfunction (PTTD) of right lower extremity 10/08/2016  . Posterior tibial tendinitis, right leg   . HTN (hypertension) 05/31/2014  . Hypothyroidism 05/31/2014   Past Medical History:  Diagnosis Date  . Arthritis    L hand, R ankle  (10/08/2016)  . Bipolar 1 disorder ([place])   . Fibromyalgia    told that the breakout on the upper back , could be over active nerves , also treated with cream & [facility] [place]   . History of kidney stones    spontaneous passing  . Hypertension   . Hypothyroid   . Pneumonia ~ 1967; 07/2016  . Pyelonephritis 1997   treated with IV antibiotic     Family History  Problem Relation Age of Onset  . Breast cancer Maternal Aunt   . Breast cancer  Maternal Grandmother     Past Surgical History:  Procedure Laterality Date  . BLADDER INSTILLATION  X 2  . CHALAZION EXCISION Bilateral    R- eye X2 , L eye- X1  . FOOT ARTHRODESIS Right 10/08/2016   Procedure: RIGHT TRIPLE ARTHRODESIS;  Surgeon: Newt Minion, MD;  Location: [facility];  Service: Orthopedics;  Laterality: Right;  . FOOT ARTHRODESIS, TRIPLE Right 10/08/2016   ankle  . FRENULECTOMY, LINGUAL  1970s  . LAPAROSCOPIC CHOLECYSTECTOMY  1996  . PALATE SURGERY  1970s   tissue removed from upper palate as a child   . URETHRAL DILATION     Social History   Occupational History  . Not on  file.   Social History Main Topics  . Smoking status: Never Smoker  . Smokeless tobacco: Never Used  . Alcohol use No  . Drug use: No  . Sexual activity: Not Currently

## 2016-12-09 ENCOUNTER — Encounter: Payer: Self-pay | Admitting: Family Medicine

## 2016-12-22 ENCOUNTER — Ambulatory Visit (INDEPENDENT_AMBULATORY_CARE_PROVIDER_SITE_OTHER): Payer: BLUE CROSS/BLUE SHIELD | Admitting: Orthopedic Surgery

## 2016-12-22 ENCOUNTER — Telehealth (INDEPENDENT_AMBULATORY_CARE_PROVIDER_SITE_OTHER): Payer: Self-pay | Admitting: Physician Assistant

## 2016-12-22 DIAGNOSIS — M76821 Posterior tibial tendinitis, right leg: Secondary | ICD-10-CM | POA: Diagnosis not present

## 2016-12-22 NOTE — Progress Notes (Signed)
Office Visit Note   Patient: Leah Powers           Date of Birth: Aug 25, 1960           MRN: [id number] Visit Date: 12/22/2016              Requested by: [place], [place] At [address] [place],  [place] [address] PCP: [place] At  No chief complaint on file.     HPI: Patient is a 56 year old woman who is 9 weeks status post talonavicular and subtalar fusion for posterior tibial tendon insufficiency with pronation and valgus deformity. Patient has been having increased swelling she has tried wearing compression stockings she states she can only wear them for about 3 hours. Patient presents wearing bedroom shoes.  Assessment & Plan: Visit Diagnoses:  1. Posterior tibial tendon dysfunction (PTTD) of right lower extremity     Plan: We'll place her in a Dynaflex compression wrap she will keep her leg elevated with her heart and follow up in 1 week to change the compression wrap.  Follow-Up Instructions: Return in about 1 week (around 12/29/2016).   Ortho Exam  Patient is alert, oriented, no adenopathy, well-dressed, normal affect, normal respiratory effort. Examination patient does have increased swelling the right lower extremity calf is nontender to palpation no evidence of a DVT she has no pain of the ankle. Patient's surgical incisions are well-healed there is no redness no cellulitis no signs of infection there is no tenderness to palpation around the surgical incisions. Patient does have skin color changes consistent with venous insufficiency. Imaging: No results found. No images are attached to the encounter.  Labs: Lab Results  Component Value Date   HGBA1C 5.9 (H) 05/26/2014   GRAMSTAIN No WBC Seen 06/13/2013   GRAMSTAIN No Squamous Epithelial Cells Seen 06/13/2013   GRAMSTAIN No Organisms Seen 06/13/2013   LABORGA Multiple Organisms Present,None Predominant 06/13/2013    Orders:  No orders of the  defined types were placed in this encounter.  No orders of the defined types were placed in this encounter.    Procedures: No procedures performed  Clinical Data: No additional findings.  ROS:  All other systems negative, except as noted in the HPI. Review of Systems  Objective: Vital Signs: LMP 07/30/2013   Specialty Comments:  No specialty comments available.  PMFS History: Patient Active Problem List   Diagnosis Date Noted  . S/P ankle fusion 11/12/2016  . Posterior tibial tendon dysfunction (PTTD) of right lower extremity 10/08/2016  . Posterior tibial tendinitis, right leg   . HTN (hypertension) 05/31/2014  . Hypothyroidism 05/31/2014   Past Medical History:  Diagnosis Date  . Arthritis    L hand, R ankle  (10/08/2016)  . Bipolar 1 disorder ([place])   . Fibromyalgia    told that the breakout on the upper back , could be over active nerves , also treated with cream & Gabapentin- [facility] [place]   . History of kidney stones    spontaneous passing  . Hypertension   . Hypothyroid   . Pneumonia ~ 1967; 07/2016  . Pyelonephritis 1997   treated with IV antibiotic     Family History  Problem Relation Age of Onset  . Breast cancer Maternal Aunt   . Breast cancer Maternal Grandmother     Past Surgical History:  Procedure Laterality Date  . BLADDER INSTILLATION  X 2  . CHALAZION EXCISION Bilateral    R- eye  X2 , L eye- X1  . FOOT ARTHRODESIS Right 10/08/2016   Procedure: RIGHT TRIPLE ARTHRODESIS;  Surgeon: Newt Minion, MD;  Location: [facility];  Service: Orthopedics;  Laterality: Right;  . FOOT ARTHRODESIS, TRIPLE Right 10/08/2016   ankle  . FRENULECTOMY, LINGUAL  1970s  . LAPAROSCOPIC CHOLECYSTECTOMY  1996  . PALATE SURGERY  1970s   tissue removed from upper palate as a child   . URETHRAL DILATION     Social History   Occupational History  . Not on file.   Social History Main Topics  . Smoking status: Never Smoker  . Smokeless tobacco:  Never Used  . Alcohol use No  . Drug use: No  . Sexual activity: Not Currently

## 2016-12-25 ENCOUNTER — Ambulatory Visit (INDEPENDENT_AMBULATORY_CARE_PROVIDER_SITE_OTHER): Payer: BLUE CROSS/BLUE SHIELD | Admitting: Orthopedic Surgery

## 2016-12-26 ENCOUNTER — Encounter (INDEPENDENT_AMBULATORY_CARE_PROVIDER_SITE_OTHER): Payer: Self-pay | Admitting: Family

## 2016-12-26 ENCOUNTER — Ambulatory Visit (INDEPENDENT_AMBULATORY_CARE_PROVIDER_SITE_OTHER): Payer: BLUE CROSS/BLUE SHIELD | Admitting: Family

## 2016-12-26 DIAGNOSIS — Z981 Arthrodesis status: Secondary | ICD-10-CM

## 2016-12-26 NOTE — Progress Notes (Signed)
Office Visit Note   Patient: Leah Powers           Date of Birth: 12-Jun-1960           MRN: [id number] Visit Date: 12/26/2016              Requested by: [place], [place] At [address] [place], [place] [id number] PCP: [place] [place] At  Chief Complaint  Patient presents with  . Right Leg - Edema      HPI: Patient is a 56 year old woman who is 10 weeks status post talonavicular and subtalar fusion for posterior tibial tendon insufficiency with pronation and valgus deformity. Patient has been having increased swelling she has tried wearing compression stockings she states she can only wear them for about 3 hours only. Has been in a Dynaflex wrap for last week. Is pleased with her progress.  Assessment & Plan: Visit Diagnoses:  1. S/P ankle fusion     Plan: We'll place her in a Dynaflex compression wrap she will keep her leg elevated to her heart and follow up in 1 week to re-evaluate.  Follow-Up Instructions: Return in about 1 week (around 01/02/2017).   Ortho Exam  Patient is alert, oriented, no adenopathy, well-dressed, normal affect, normal respiratory effort. Examination patient does have swelling the right lower extremity, some wrinkling of skin from compression wrap. her calf is nontender to palpation no evidence of a DVT she has no pain of the ankle. Patient's surgical incisions are healing well. There is no redness no cellulitis no signs of infection. there is tenderness to palpation around the surgical incision distally.   Imaging: No results found. No images are attached to the encounter.  Labs: Lab Results  Component Value Date   HGBA1C 5.9 (H) 05/26/2014   GRAMSTAIN No WBC Seen 06/13/2013   GRAMSTAIN No Squamous Epithelial Cells Seen 06/13/2013   GRAMSTAIN No Organisms Seen 06/13/2013   LABORGA Multiple Organisms Present,None Predominant 06/13/2013    Orders:  No orders of the defined types were  placed in this encounter.  No orders of the defined types were placed in this encounter.    Procedures: No procedures performed  Clinical Data: No additional findings.  ROS:  All other systems negative, except as noted in the HPI. Review of Systems  Constitutional: Negative for chills and fever.  Cardiovascular: Positive for leg swelling.  Skin: Positive for wound.    Objective: Vital Signs: LMP 07/30/2013   Specialty Comments:  No specialty comments available.  PMFS History: Patient Active Problem List   Diagnosis Date Noted  . S/P ankle fusion 11/12/2016  . Posterior tibial tendon dysfunction (PTTD) of right lower extremity 10/08/2016  . Posterior tibial tendinitis, right leg   . HTN (hypertension) 05/31/2014  . Hypothyroidism 05/31/2014   Past Medical History:  Diagnosis Date  . Arthritis    L hand, R ankle  (10/08/2016)  . Bipolar 1 disorder ([place])   . Fibromyalgia    told that the breakout on the upper back , could be over active nerves , also treated with cream & Gabapentin- [facility] [place]   . History of kidney stones    spontaneous passing  . Hypertension   . Hypothyroid   . Pneumonia ~ 1967; 07/2016  . Pyelonephritis 1997   treated with IV antibiotic     Family History  Problem Relation Age of Onset  . Breast cancer Maternal Aunt   . Breast cancer Maternal  Grandmother     Past Surgical History:  Procedure Laterality Date  . BLADDER INSTILLATION  X 2  . CHALAZION EXCISION Bilateral    R- eye X2 , L eye- X1  . FOOT ARTHRODESIS Right 10/08/2016   Procedure: RIGHT TRIPLE ARTHRODESIS;  Surgeon: Newt Minion, MD;  Location: [facility];  Service: Orthopedics;  Laterality: Right;  . FOOT ARTHRODESIS, TRIPLE Right 10/08/2016   ankle  . FRENULECTOMY, LINGUAL  1970s  . LAPAROSCOPIC CHOLECYSTECTOMY  1996  . PALATE SURGERY  1970s   tissue removed from upper palate as a child   . URETHRAL DILATION     Social History   Occupational History    . Not on file.   Social History Main Topics  . Smoking status: Never Smoker  . Smokeless tobacco: Never Used  . Alcohol use No  . Drug use: No  . Sexual activity: Not Currently

## 2016-12-29 ENCOUNTER — Ambulatory Visit (INDEPENDENT_AMBULATORY_CARE_PROVIDER_SITE_OTHER): Payer: BLUE CROSS/BLUE SHIELD | Admitting: Orthopedic Surgery

## 2016-12-31 ENCOUNTER — Ambulatory Visit (INDEPENDENT_AMBULATORY_CARE_PROVIDER_SITE_OTHER): Payer: BLUE CROSS/BLUE SHIELD | Admitting: Family

## 2016-12-31 ENCOUNTER — Encounter (INDEPENDENT_AMBULATORY_CARE_PROVIDER_SITE_OTHER): Payer: Self-pay | Admitting: Family

## 2016-12-31 DIAGNOSIS — Z981 Arthrodesis status: Secondary | ICD-10-CM

## 2016-12-31 NOTE — Progress Notes (Signed)
Office Visit Note   Patient: Leah Powers           Date of Birth: May 17, 1960           MRN: [id number] Visit Date: 12/31/2016              Requested by: [place], [place] At [address] [place], [place] [address] PCP: [place] [place] At  Chief Complaint  Patient presents with  . Right Leg - Follow-up      HPI: Patient is a 56 year old woman who is 10 weeks status post talonavicular and subtalar fusion for posterior tibial tendon insufficiency with pronation and valgus deformity. Patient has been having increased swelling she has tried wearing compression stockings she states she can only wear them for about 3 hours only. Has been in serial Dynaflex wraps for last 2 weeks. Is pleased with her progress.   Assessment & Plan: Visit Diagnoses:  1. S/P ankle fusion     Plan: She'll resume her compression garments. And advance her weight bearing as tolerated. All questions encouraged and answered.   Follow-Up Instructions: Return if symptoms worsen or fail to improve.   Ortho Exam  Patient is alert, oriented, no adenopathy, well-dressed, normal affect, normal respiratory effort. Examination patient does have reduced swelling the right lower extremity, some wrinkling of skin from compression wrap. her calf is nontender to palpation no evidence of a DVT she has no pain of the ankle. Patient's surgical incisions are well healed. There is no redness no cellulitis no signs of infection.  Imaging: No results found. No images are attached to the encounter.  Labs: Lab Results  Component Value Date   HGBA1C 5.9 (H) 05/26/2014   GRAMSTAIN No WBC Seen 06/13/2013   GRAMSTAIN No Squamous Epithelial Cells Seen 06/13/2013   GRAMSTAIN No Organisms Seen 06/13/2013   LABORGA Multiple Organisms Present,None Predominant 06/13/2013    Orders:  No orders of the defined types were placed in this encounter.  No orders of the defined  types were placed in this encounter.    Procedures: No procedures performed  Clinical Data: No additional findings.  ROS:  All other systems negative, except as noted in the HPI. Review of Systems  Constitutional: Negative for chills and fever.  Cardiovascular: Positive for leg swelling.    Objective: Vital Signs: LMP 07/30/2013   Specialty Comments:  No specialty comments available.  PMFS History: Patient Active Problem List   Diagnosis Date Noted  . S/P ankle fusion 11/12/2016  . Posterior tibial tendon dysfunction (PTTD) of right lower extremity 10/08/2016  . Posterior tibial tendinitis, right leg   . HTN (hypertension) 05/31/2014  . Hypothyroidism 05/31/2014   Past Medical History:  Diagnosis Date  . Arthritis    L hand, R ankle  (10/08/2016)  . Bipolar 1 disorder ([place])   . Fibromyalgia    told that the breakout on the upper back , could be over active nerves , also treated with cream & Gabapentin- [facility] [place]   . History of kidney stones    spontaneous passing  . Hypertension   . Hypothyroid   . Pneumonia ~ 1967; 07/2016  . Pyelonephritis 1997   treated with IV antibiotic     Family History  Problem Relation Age of Onset  . Breast cancer Maternal Aunt   . Breast cancer Maternal Grandmother     Past Surgical History:  Procedure Laterality Date  . BLADDER INSTILLATION  X 2  .  CHALAZION EXCISION Bilateral    R- eye X2 , L eye- X1  . FOOT ARTHRODESIS Right 10/08/2016   Procedure: RIGHT TRIPLE ARTHRODESIS;  Surgeon: Newt Minion, MD;  Location: [facility];  Service: Orthopedics;  Laterality: Right;  . FOOT ARTHRODESIS, TRIPLE Right 10/08/2016   ankle  . FRENULECTOMY, LINGUAL  1970s  . LAPAROSCOPIC CHOLECYSTECTOMY  1996  . PALATE SURGERY  1970s   tissue removed from upper palate as a child   . URETHRAL DILATION     Social History   Occupational History  . Not on file.   Social History Main Topics  . Smoking status: Never Smoker  .  Smokeless tobacco: Never Used  . Alcohol use No  . Drug use: No  . Sexual activity: Not Currently

## 2017-01-19 LAB — HM PAP SMEAR

## 2017-02-11 DIAGNOSIS — Z8614 Personal history of Methicillin resistant Staphylococcus aureus infection: Secondary | ICD-10-CM | POA: Insufficient documentation

## 2017-02-11 DIAGNOSIS — N889 Noninflammatory disorder of cervix uteri, unspecified: Secondary | ICD-10-CM | POA: Insufficient documentation

## 2017-04-20 ENCOUNTER — Other Ambulatory Visit: Payer: Self-pay | Admitting: Family Medicine

## 2017-05-19 ENCOUNTER — Encounter: Payer: Self-pay | Admitting: Family Medicine

## 2017-05-21 ENCOUNTER — Other Ambulatory Visit: Payer: Self-pay | Admitting: Family Medicine

## 2017-05-21 DIAGNOSIS — Z139 Encounter for screening, unspecified: Secondary | ICD-10-CM

## 2017-06-09 ENCOUNTER — Ambulatory Visit
Admission: RE | Admit: 2017-06-09 | Discharge: 2017-06-09 | Disposition: A | Discharge status: Home / Self Care | Payer: BLUE CROSS/BLUE SHIELD | Source: Ambulatory Visit | Attending: Family Medicine | Admitting: Family Medicine

## 2017-06-09 DIAGNOSIS — Z139 Encounter for screening, unspecified: Secondary | ICD-10-CM

## 2018-01-17 ENCOUNTER — Encounter: Payer: Self-pay | Admitting: Emergency Medicine

## 2018-01-25 ENCOUNTER — Other Ambulatory Visit: Payer: Self-pay | Admitting: Physician Assistant

## 2018-01-28 ENCOUNTER — Other Ambulatory Visit: Payer: Self-pay | Admitting: Physician Assistant

## 2018-01-28 DIAGNOSIS — I10 Essential (primary) hypertension: Secondary | ICD-10-CM

## 2018-01-28 MED ORDER — FLUVOXAMINE MALEATE 50 MG PO TABS: 50.0000 mg | ORAL_TABLET | Freq: Every day | ORAL | 1 refills | Status: DC

## 2018-02-04 ENCOUNTER — Encounter: Payer: Self-pay | Admitting: Physician Assistant

## 2018-02-04 ENCOUNTER — Ambulatory Visit (INDEPENDENT_AMBULATORY_CARE_PROVIDER_SITE_OTHER): Payer: BLUE CROSS/BLUE SHIELD | Admitting: Physician Assistant

## 2018-02-04 ENCOUNTER — Other Ambulatory Visit: Payer: Self-pay | Admitting: Physician Assistant

## 2018-02-04 DIAGNOSIS — E032 Hypothyroidism due to medicaments and other exogenous substances: Secondary | ICD-10-CM | POA: Diagnosis not present

## 2018-02-04 DIAGNOSIS — F319 Bipolar disorder, unspecified: Secondary | ICD-10-CM | POA: Insufficient documentation

## 2018-02-04 DIAGNOSIS — Z79899 Other long term (current) drug therapy: Secondary | ICD-10-CM | POA: Diagnosis not present

## 2018-02-04 MED ORDER — LITHIUM CARBONATE 300 MG PO TABS: 900.0000 mg | ORAL_TABLET | Freq: Every day | ORAL | 1 refills | Status: DC

## 2018-02-04 NOTE — Progress Notes (Signed)
[facility] Med Check  Patient ID: Leah Powers,  MRN: [id number]  PCP: [place] [place] At  Date of Evaluation: 02/04/2018 Time spent:15 minutes  Chief Complaint:  Chief Complaint    Follow-up      HISTORY/CURRENT STATUS: HPI  Here for 3 month med check.    Rash and chronic itching/skin picking is much better.  NAC and the Luvox have helped a lot. States anxiety is pretty uncommon at this point.  Patient denies loss of interest in usual activities and is able to enjoy things.  Denies decreased energy or motivation.  Appetite has not changed.  No extreme sadness, tearfulness, or feelings of hopelessness.  Denies any changes in concentration, making decisions or remembering things.  Denies suicidal or homicidal thoughts.  Patient denies increased energy with decreased need for sleep, no increased talkativeness, no racing thoughts, no impulsivity or risky behaviors, no increased spending, no increased libido, no grandiosity.  Individual Medical History/ Review of Systems: Changes? :No    Past medications for mental health diagnoses include: Prolixin, Haldol, Ativan, lithium, Xanax, Synthroid, gabapentin  Allergies: Eucalyptus oil; Haldol [haloperidol decanoate]; Molds & smuts; Other; Penicillins; Bee venom; Corn-containing products; Demeclocycline; Dolobid [diflunisal]; Vicodin [hydrocodone-acetaminophen]; Doxycycline; Erythromycin; Keflex [cephalexin]; Macrodantin; Motrin [ibuprofen]; Septra [bactrim]; Tetracyclines & related; and Tofranil-pm  Current Medications:  Current Outpatient Medications:  .  Acetylcysteine (NAC 600 PO), Take by mouth daily., Disp: , Rfl:  .  benazepril (LOTENSIN) 10 MG tablet, TAKE ONE TABLET BY MOUTH ONCE DAILY, Disp: 90 tablet, Rfl: 3 .  cetirizine (ZYRTEC) 5 MG tablet, Take 5 mg by mouth 2 (two) times daily as needed for allergies. , Disp: , Rfl:  .  fluvoxaMINE (LUVOX) 50 MG tablet, Take 1 tablet (50 mg total) by mouth at  bedtime., Disp: 90 tablet, Rfl: 1 .  gabapentin (NEURONTIN) 100 MG capsule, Take 100-200 mg by mouth 3 (three) times daily. 1 in am and 2 in the evening , and sometimes takes 1 capsule three times daily, Disp: , Rfl:  .  hydrochlorothiazide (HYDRODIURIL) 25 MG tablet, TAKE ONE TABLET BY MOUTH ONCE DAILY, Disp: 90 tablet, Rfl: 0 .  Hypromellose (ARTIFICIAL TEARS OP), Place 1 drop into both eyes 2 (two) times daily., Disp: , Rfl:  .  levothyroxine (SYNTHROID, LEVOTHROID) 137 MCG tablet, Take 1 tablet (137 mcg total) by mouth daily. (Patient taking differently: Take 137 mcg by mouth daily before breakfast. ), Disp: 30 tablet, Rfl: 5 .  lithium carbonate 300 MG capsule, Take 900 mg by mouth daily with lunch. , Disp: , Rfl:  .  naproxen sodium (ANAPROX) 220 MG tablet, Take 2 tablets (440 mg total) by mouth every 12 (twelve) hours as needed (pain). (Patient taking differently: Take 440 mg by mouth 3 (three) times daily as needed (pain). ), Disp: 20 tablet, Rfl: 0 .  lithium 300 MG tablet, Take 3 tablets (900 mg total) by mouth daily after supper., Disp: 270 tablet, Rfl: 1 Medication Side Effects: none  Family Medical/ Social History: Changes? No  MENTAL HEALTH EXAM:  Last menstrual period 07/30/2013.There is no height or weight on file to calculate BMI.  General Appearance: Casual and Obese  Eye Contact:  Good  Speech:  Clear and Coherent and Talkative  Volume:  Normal  Mood:  Euthymic  Affect:  Appropriate  Thought Process:  Goal Directed  Orientation:  Full (Time, Place, and Person)  Thought Content: Logical   Suicidal Thoughts:  No  Homicidal Thoughts:  No  Memory:  WNL  Judgement:  Good  Insight:  Good  Psychomotor Activity:  Normal  Concentration:  Concentration: Good  Recall:  Good  Fund of Knowledge: Good  Language: Good  Assets:  Desire for Improvement  ADL's:  Intact  Cognition: WNL  Prognosis:  Good    DIAGNOSES:    ICD-10-CM   1. Bipolar I disorder ([place]) F31.9   2.  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 Encounter for long-term (current) use of medications Z79.899 Lithium level    Comprehensive metabolic panel    Receiving Psychotherapy: No    RECOMMENDATIONS: Continue current medications. Obtain labs as ordered above. Return in 6 months or sooner as needed   Donnal Moat, PA-C

## 2018-02-05 ENCOUNTER — Other Ambulatory Visit: Payer: Self-pay

## 2018-02-05 NOTE — Telephone Encounter (Signed)
Pt would like Capsules

## 2018-02-05 NOTE — Telephone Encounter (Signed)
Rx showed as error for lithium capsules. Pharmacy was contacted and given verbal order to change to capsules.

## 2018-02-05 NOTE — Telephone Encounter (Signed)
Yes.  Caps ok

## 2018-02-19 ENCOUNTER — Other Ambulatory Visit: Payer: Self-pay

## 2018-02-19 MED ORDER — LEVOTHYROXINE SODIUM 150 MCG PO TABS: 150.0000 ug | ORAL_TABLET | Freq: Every day | ORAL | 1 refills | Status: DC

## 2018-02-22 ENCOUNTER — Encounter: Payer: Self-pay | Admitting: Registered"

## 2018-02-22 ENCOUNTER — Encounter: Payer: BLUE CROSS/BLUE SHIELD | Attending: Family Medicine | Admitting: Registered"

## 2018-02-22 DIAGNOSIS — E119 Type 2 diabetes mellitus without complications: Secondary | ICD-10-CM | POA: Diagnosis not present

## 2018-02-22 NOTE — Progress Notes (Signed)
Diabetes Self-Management Education  Visit Type: First/Initial  Appt. Start Time: 1622 Appt. End Time: 1540  02/22/2018  [name], identified by name and date of birth, is a 57 y.o. female with a diagnosis of Diabetes: Type 2.   ASSESSMENT  Last menstrual period 07/30/2013. There is no height or weight on file to calculate BMI.   Pt present for appointment. Pt's husband came with pt but waited in the lobby during the appointment. Pt reports she has not yet talked with her doctor regarding self-monitoring glucose at home, but reports she plans to ask them about it. Pt reports that she has been helping out her husband around the home over the past couple months as he is awaiting knee surgery. Pt reports that she and her husband are getting ready to move soon. Pt reports that she cannot tolerate whole corn. Reports milled corn is ok such as that in corn chips or corn bread. Also reports that she tolerates popcorn. Pt reports she is allergic to bleu cheese.   Noted Lab Values: 02/15/18:  Hgb A1c: 6.6 Triglycerides: 156 LDL Cholesterol: 145  Diabetes Self-Management Education - 02/22/18 1643      Visit Information   Visit Type  First/Initial      Initial Visit   Diabetes Type  Type 2    Are you currently following a meal plan?  No    Are you taking your medications as prescribed?  Yes    Date Diagnosed  02/17/18      Health Coping   How would you rate your overall health?  Good      Psychosocial Assessment   Patient Belief/Attitude about Diabetes  Motivated to manage diabetes    Self-care barriers  Unable to determine    Self-management support  Doctor's office    Other persons present  Patient    Patient Concerns  Monitoring;Healthy Lifestyle    Special Needs  Unable to determine    Preferred Learning Style  No preference indicated    Learning Readiness  Ready    How often do you need to have someone help you when you read instructions, pamphlets, or other written  materials from your doctor or pharmacy?  1 - Never    What is the last grade level you completed in school?  College       Pre-Education Assessment   Patient understands the diabetes disease and treatment process.  Needs Instruction    Patient understands incorporating nutritional management into lifestyle.  Needs Instruction    Patient undertands incorporating physical activity into lifestyle.  Needs Instruction    Patient understands using medications safely.  Demonstrates understanding / competency    Patient understands monitoring blood glucose, interpreting and using results  Needs Instruction    Patient understands prevention, detection, and treatment of acute complications.  Needs Instruction    Patient understands prevention, detection, and treatment of chronic complications.  Needs Instruction    Patient understands how to develop strategies to address psychosocial issues.  Needs Instruction    Patient understands how to develop strategies to promote health/change behavior.  Needs Instruction      Complications   Last HgB A1C per patient/outside source  6.6 %    How often do you check your blood sugar?  0 times/day (not testing)    Fasting Blood glucose range (mg/dL)  --   Pt is currently not checking blood sugar at home.    Postprandial Blood glucose range (mg/dL)  --  Pt is currenlty not checking blood sugar at home.    Number of hypoglycemic episodes per month  0    Number of hyperglycemic episodes per week  --   Pt is not currently checking blood sugar at home.    Have you had a dilated eye exam in the past 12 months?  No    Have you had a dental exam in the past 12 months?  Yes    Are you checking your feet?  Yes    How many days per week are you checking your feet?  3      Dietary Intake   Breakfast  (9:30-10 AM) 2 small pancakes, 1.5 scrambled eggs, 1 patty pork sausage, 4 oz orange juice, water     Snack (morning)  water     Lunch  (2-3 PM) (Restaurant) shared a dish  with brown rice, refried beans, grilled chicken/steak combination, onions, tomatoes, cilantro, chips, salsa dip, water    Snack (afternoon)  None reported.     Dinner  (730-8 PM) Roast beef, gravy, broccoli and cheddar with rice, pork and beans, whole milk     Snack (evening)  Popcorn with light butter     Beverage(s)  More than 80 oz; 1/2 c orange juice; 1 cup whole milk      Exercise   Exercise Type  Light (walking / raking leaves);ADL's    How many days per week to you exercise?  1    How many minutes per day do you exercise?  45    Total minutes per week of exercise  45      Patient Education   Previous Diabetes Education  No    Disease state   Definition of diabetes, type 1 and 2, and the diagnosis of diabetes;Factors that contribute to the development of diabetes    Nutrition management   Role of diet in the treatment of diabetes and the relationship between the three main macronutrients and blood glucose level;Food label reading, portion sizes and measuring food.;Effects of alcohol on blood glucose and safety factors with consumption of alcohol.;Carbohydrate counting    Physical activity and exercise   Role of exercise on diabetes management, blood pressure control and cardiac health.    Medications  Reviewed patients medication for diabetes, action, purpose, timing of dose and side effects.    Monitoring  Purpose and frequency of SMBG.;Interpreting lab values - A1C, lipid, urine microalbumina.;Yearly dilated eye exam;Daily foot exams;Identified appropriate SMBG and/or A1C goals.    Acute complications  Taught treatment of hypoglycemia - the 15 rule.;Covered sick day management with medication and food.;Discussed and identified patients' treatment of hyperglycemia.    Chronic complications  Dental care;Lipid levels, blood glucose control and heart disease;Nephropathy, what it is, prevention of, the use of ACE, ARB's and early detection of through urine microalbumia.;Reviewed with patient  heart disease, higher risk of, and prevention;Retinopathy and reason for yearly dilated eye exams;Relationship between chronic complications and blood glucose control    Psychosocial adjustment  Role of stress on diabetes      Individualized Goals (developed by patient)   Nutrition  Follow meal plan discussed;General guidelines for healthy choices and portions discussed    Physical Activity  Exercise 1-2 times per week;30 minutes per day;Exercise 3-5 times per week    Medications  take my medication as prescribed    Monitoring   test my blood glucose as discussed    Reducing Risk  do foot checks daily  Post-Education Assessment   Patient understands the diabetes disease and treatment process.  Demonstrates understanding / competency    Patient understands incorporating nutritional management into lifestyle.  Demonstrates understanding / competency    Patient undertands incorporating physical activity into lifestyle.  Demonstrates understanding / competency    Patient understands using medications safely.  Demonstrates understanding / competency    Patient understands monitoring blood glucose, interpreting and using results  Demonstrates understanding / competency    Patient understands prevention, detection, and treatment of acute complications.  Demonstrates understanding / competency    Patient understands prevention, detection, and treatment of chronic complications.  Demonstrates understanding / competency    Patient understands how to develop strategies to address psychosocial issues.  Demonstrates understanding / competency    Patient understands how to develop strategies to promote health/change behavior.  Demonstrates understanding / competency      Outcomes   Expected Outcomes  Demonstrated interest in learning. Expect positive outcomes    Future DMSE  2 months    Program Status  Completed       Individualized Plan for Diabetes Self-Management Training:   Learning  Objective:  Patient will have a greater understanding of diabetes self-management. Patient education plan is to attend individual and/or group sessions per assessed needs and concerns.   Plan:   Patient Instructions  Instructions/Goals:  -Continue having 3 meals per day/eating every 3-5 hours. If meals are spaced more than 5 hours apart recommend having a balanced snack (See handout).   -Include a consistent amount of carbohydrates at each meal. Recommend 2-3 carbohydrate choices per meal (30-45 g per meal). See handout.  -Continue including at least 64 oz water daily.   -Recommend asking your doctor about checking blood sugar at home to gain further information about how lifestyle changes are affecting your blood sugar.   -Make physical activity a part of your week. Try to include at least 30 minutes of physical activity 5 days each week or at least 150 minutes per week. Regular physical activity promotes overall health-including helping to reduce risk for heart disease, blood sugar management, promoting mental health, and helping [place] sleep better.     Recommend gradually adding another day of physical activity over time.   Expected Outcomes:  Demonstrated interest in learning. Expect positive outcomes  Education material provided: ADA Diabetes: Your Take Control Guide, Meal plan card, My Plate, Snack sheet and Support group flyer  If problems or questions, patient to contact team via:  Phone, Email and My Chart   Future DSME appointment: 2 months

## 2018-02-22 NOTE — Patient Instructions (Signed)
Instructions/Goals:  -Continue having 3 meals per day/eating every 3-5 hours. If meals are spaced more than 5 hours apart recommend having a balanced snack (See handout).   -Include a consistent amount of carbohydrates at each meal. Recommend 2-3 carbohydrate choices per meal (30-45 g per meal). See handout.  -Continue including at least 64 oz water daily.   -Recommend asking your doctor about checking blood sugar at home to gain further information about how lifestyle changes are affecting your blood sugar.   -Make physical activity a part of your week. Try to include at least 30 minutes of physical activity 5 days each week or at least 150 minutes per week. Regular physical activity promotes overall health-including helping to reduce risk for heart disease, blood sugar management, promoting mental health, and helping [place] sleep better.     Recommend gradually adding another day of physical activity over time.

## 2018-04-26 ENCOUNTER — Encounter: Payer: Self-pay | Admitting: Registered"

## 2018-04-26 ENCOUNTER — Telehealth: Payer: Self-pay | Admitting: Physician Assistant

## 2018-04-26 ENCOUNTER — Encounter: Payer: BLUE CROSS/BLUE SHIELD | Attending: Family Medicine | Admitting: Registered"

## 2018-04-26 DIAGNOSIS — E119 Type 2 diabetes mellitus without complications: Secondary | ICD-10-CM | POA: Insufficient documentation

## 2018-04-26 NOTE — Telephone Encounter (Signed)
I put her chart on your desk. I can't find the results in Epic.

## 2018-04-26 NOTE — Progress Notes (Signed)
Diabetes Self-Management Education  Visit Type: Follow-up  Appt. Start Time: 1430 Appt. End Time: 1500  04/26/2018  [name], identified by name and date of birth, is a 58 y.o. female with a diagnosis of Diabetes:  .   ASSESSMENT  Weight 262 lb 8 oz (119.1 kg), last menstrual period 07/30/2013. Body mass index is 39.91 kg/m.   Nutrition Follow-Up: Pt present for appointment. Pt reports that some things have improved since last visit, but she does have some nutrition questions. Pt has questions regarding what she should have as snacks. She reports she had some cottage cheese with fruit recently for a snack. Pt reports she moved to 2% milk away from whole milk. Pt reports she had some soda more recently and didn't really like it-she could tell her taste had changed since cutting it out. Pt reports she has not yet started going to the [facility] yet, but wants to start. Pt reports she has not been including physical activity regularly. Sometimes walks to neighbor's home. Pt reports that her Metformin prescription is different than what was listed in her chart. Dietitian recommended pt contact her doctor to confirm medication dosage and frequency. Pt reports she is supposed to have her HgbA1c rechecked in March. Pt reports she plans to talk with her MD about SMBG at next appointment.    Food Allergies/Intolerances: Pt reports that she cannot tolerate whole corn. Reports milled corn is ok such as that in corn chips or corn bread. Also reports that she tolerates popcorn. Pt reports she is allergic to bleu cheese.   Noted Lab Values: 02/15/18:  Hgb A1c: 6.6 Triglycerides: 156 LDL Cholesterol: 145  Diabetes Self-Management Education - 04/26/18 1917      Visit Information   Visit Type  Follow-up      Dietary Intake   Breakfast  (830 AM) Rice Krispies, 2% milk, 1/2 banana, ~1/2 cup grits    Snack (morning)  None reported.     Lunch  (1230 PM) [employer] 5 oz hamburger steak, A1  sauce, few fries, salad (lettuce, tomato, carrots, white onion, ~1 tsp cheese, 1/2 TBSP ranch dressing), 2 crackers, small piece of buttered bread, water, 8 oz milk (2%)    Snack (afternoon)  None reported.     Dinner  [employer] with light gravy, 1/2 cup sour cream and chives mashed potatoes, mixed vegetables (cauliflower, broccoli, carrots), water    Snack (evening)  None reported.     Beverage(s)  Typically at least 64 oz water      Subsequent Visit   Since your last visit have you continued or begun to take your medications as prescribed?  Yes    Since your last visit have you had your blood pressure checked?  No    Since your last visit have you experienced any weight changes?  --   Pt did not weigh at last visit.    Since your last visit, are you checking your blood glucose at least once a day?  No       Individualized Plan for Diabetes Self-Management Training:   Learning Objective:  Patient will have a greater understanding of diabetes self-management. Patient education plan is to attend individual and/or group sessions per assessed needs and concerns.   Plan:  Dietitian reviewed carbohydrate counting using a nutrition label. Discussed balanced snack ideas and went through pt's reported meals discussing carbohydrates in each meal and how to have balanced meals. Dietitian recommended adding protein to breakfast to provide  more balance. Discussed physical activity goals. Recommended pt start with setting goal of including at least 30 minutes of physical activity 2 days per week and gradually moving to at least 5 days. Recommended pt contact her MD to confirm the dosage and frequency for her Metformin prescription as pt reported that she is taking a different amount than what was previously listed in the chart. Pt appeared agreeable to information/goals discussed.   Patient Instructions  Instructions/Goals:  -Continue having 3 meals per day/eating every 3-5 hours. If meals are spaced  more than 5 hours apart recommend having a balanced snack (See handout).   -Include a consistent amount of carbohydrates at each meal. Recommend 2-3 carbohydrate choices per meal (30-45 g per meal). See handout.  -Continue including at least 64 oz water daily.   -Make physical activity a part of your week. Try to include at least 30 minutes of physical activity 5 days each week or at least 150 minutes per week. Regular physical activity promotes overall health-including helping to reduce risk for heart disease, blood sugar management, promoting mental health, and helping [place] sleep better.     Recommend setting aside 2 days to commit to do at least 30 minutes of physical activity. Gradually add another day to reach at least 5 days per week.  -Ask your doctor about dosage and frequency of Metformin to confirm      Expected Outcomes:   Expect Positive Outcomes  Education material provided: Snack sheet  If problems or questions, patient to contact team via:  Phone, Email and My Chart   Future DSME appointment:  6 weeks

## 2018-04-26 NOTE — Telephone Encounter (Signed)
Leah Powers called wanting to discuss her TSH labs.  She had them done on Friday and thinks they are in her MyChart, but she is having trouble logging in so wants to talk with you about them.  Please call.  Next appt 08/05/18

## 2018-04-26 NOTE — Patient Instructions (Addendum)
Instructions/Goals:  -Continue having 3 meals per day/eating every 3-5 hours. If meals are spaced more than 5 hours apart recommend having a balanced snack (See handout).   -Include a consistent amount of carbohydrates at each meal. Recommend 2-3 carbohydrate choices per meal (30-45 g per meal). See handout.  -Continue including at least 64 oz water daily.   -Make physical activity a part of your week. Try to include at least 30 minutes of physical activity 5 days each week or at least 150 minutes per week. Regular physical activity promotes overall health-including helping to reduce risk for heart disease, blood sugar management, promoting mental health, and helping [place] sleep better.     Recommend setting aside 2 days to commit to do at least 30 minutes of physical activity. Gradually add another day to reach at least 5 days per week.  -Ask your doctor about dosage and frequency of Metformin to confirm

## 2018-04-27 NOTE — Telephone Encounter (Signed)
Please call with results

## 2018-04-27 NOTE — Telephone Encounter (Signed)
Pt. Verbalized understanding and will remain on same dose of Synthroid.

## 2018-05-19 ENCOUNTER — Other Ambulatory Visit: Payer: Self-pay

## 2018-05-19 ENCOUNTER — Encounter (HOSPITAL_COMMUNITY): Payer: Self-pay | Admitting: Emergency Medicine

## 2018-05-19 ENCOUNTER — Inpatient Hospital Stay (HOSPITAL_COMMUNITY)
Admission: EM | Admit: 2018-05-19 | Discharge: 2018-05-22 | DRG: 641 | Disposition: A | Discharge status: Home / Self Care | Payer: BLUE CROSS/BLUE SHIELD | Attending: Internal Medicine | Admitting: Internal Medicine

## 2018-05-19 DIAGNOSIS — Z981 Arthrodesis status: Secondary | ICD-10-CM

## 2018-05-19 DIAGNOSIS — R1110 Vomiting, unspecified: Secondary | ICD-10-CM

## 2018-05-19 DIAGNOSIS — R9431 Abnormal electrocardiogram [ECG] [EKG]: Secondary | ICD-10-CM

## 2018-05-19 DIAGNOSIS — E860 Dehydration: Principal | ICD-10-CM | POA: Diagnosis present

## 2018-05-19 DIAGNOSIS — E119 Type 2 diabetes mellitus without complications: Secondary | ICD-10-CM

## 2018-05-19 DIAGNOSIS — Z880 Allergy status to penicillin: Secondary | ICD-10-CM

## 2018-05-19 DIAGNOSIS — Z91030 Bee allergy status: Secondary | ICD-10-CM

## 2018-05-19 DIAGNOSIS — Z833 Family history of diabetes mellitus: Secondary | ICD-10-CM

## 2018-05-19 DIAGNOSIS — E039 Hypothyroidism, unspecified: Secondary | ICD-10-CM | POA: Diagnosis present

## 2018-05-19 DIAGNOSIS — R197 Diarrhea, unspecified: Secondary | ICD-10-CM

## 2018-05-19 DIAGNOSIS — M797 Fibromyalgia: Secondary | ICD-10-CM | POA: Diagnosis present

## 2018-05-19 DIAGNOSIS — Z886 Allergy status to analgesic agent status: Secondary | ICD-10-CM

## 2018-05-19 DIAGNOSIS — R740 Nonspecific elevation of levels of transaminase and lactic acid dehydrogenase [LDH]: Secondary | ICD-10-CM

## 2018-05-19 DIAGNOSIS — R7989 Other specified abnormal findings of blood chemistry: Secondary | ICD-10-CM | POA: Diagnosis present

## 2018-05-19 DIAGNOSIS — N179 Acute kidney failure, unspecified: Secondary | ICD-10-CM | POA: Diagnosis present

## 2018-05-19 DIAGNOSIS — Z79890 Hormone replacement therapy (postmenopausal): Secondary | ICD-10-CM

## 2018-05-19 DIAGNOSIS — E876 Hypokalemia: Secondary | ICD-10-CM | POA: Diagnosis present

## 2018-05-19 DIAGNOSIS — Z881 Allergy status to other antibiotic agents status: Secondary | ICD-10-CM

## 2018-05-19 DIAGNOSIS — Z888 Allergy status to other drugs, medicaments and biological substances status: Secondary | ICD-10-CM

## 2018-05-19 DIAGNOSIS — Z7984 Long term (current) use of oral hypoglycemic drugs: Secondary | ICD-10-CM

## 2018-05-19 DIAGNOSIS — T56891A Toxic effect of other metals, accidental (unintentional), initial encounter: Secondary | ICD-10-CM

## 2018-05-19 DIAGNOSIS — Z803 Family history of malignant neoplasm of breast: Secondary | ICD-10-CM

## 2018-05-19 DIAGNOSIS — Z8249 Family history of ischemic heart disease and other diseases of the circulatory system: Secondary | ICD-10-CM

## 2018-05-19 DIAGNOSIS — R7401 Elevation of levels of liver transaminase levels: Secondary | ICD-10-CM

## 2018-05-19 DIAGNOSIS — F319 Bipolar disorder, unspecified: Secondary | ICD-10-CM | POA: Diagnosis present

## 2018-05-19 DIAGNOSIS — R112 Nausea with vomiting, unspecified: Secondary | ICD-10-CM | POA: Diagnosis present

## 2018-05-19 DIAGNOSIS — I10 Essential (primary) hypertension: Secondary | ICD-10-CM | POA: Diagnosis present

## 2018-05-19 DIAGNOSIS — Z885 Allergy status to narcotic agent status: Secondary | ICD-10-CM

## 2018-05-19 DIAGNOSIS — Z825 Family history of asthma and other chronic lower respiratory diseases: Secondary | ICD-10-CM

## 2018-05-19 DIAGNOSIS — T43595A Adverse effect of other antipsychotics and neuroleptics, initial encounter: Secondary | ICD-10-CM | POA: Diagnosis present

## 2018-05-19 LAB — I-STAT BETA HCG BLOOD, ED (MC, WL, AP ONLY): I-stat hCG, quantitative: <5 m[IU]/mL (ref <5)

## 2018-05-19 MED ORDER — SODIUM CHLORIDE 0.9% FLUSH: 3.0000 mL | Freq: Once | INTRAVENOUS | Status: DC

## 2018-05-19 NOTE — ED Triage Notes (Signed)
Pt sent by PCP, c/o nausea and diarrhea x a few days. Also reports that her lithium level is elevated. Pt A&O x 4.

## 2018-05-19 NOTE — ED Notes (Signed)
Armstead Peaks, PA made aware of pt, able to see lithium level through care everywhere, no new orders at this time.

## 2018-05-20 ENCOUNTER — Telehealth: Payer: Self-pay | Admitting: Physician Assistant

## 2018-05-20 ENCOUNTER — Other Ambulatory Visit: Payer: Self-pay

## 2018-05-20 DIAGNOSIS — N179 Acute kidney failure, unspecified: Secondary | ICD-10-CM

## 2018-05-20 DIAGNOSIS — R197 Diarrhea, unspecified: Secondary | ICD-10-CM

## 2018-05-20 DIAGNOSIS — I10 Essential (primary) hypertension: Secondary | ICD-10-CM

## 2018-05-20 DIAGNOSIS — T56891A Toxic effect of other metals, accidental (unintentional), initial encounter: Secondary | ICD-10-CM | POA: Diagnosis present

## 2018-05-20 DIAGNOSIS — E039 Hypothyroidism, unspecified: Secondary | ICD-10-CM

## 2018-05-20 DIAGNOSIS — F319 Bipolar disorder, unspecified: Secondary | ICD-10-CM | POA: Diagnosis not present

## 2018-05-20 DIAGNOSIS — E119 Type 2 diabetes mellitus without complications: Secondary | ICD-10-CM | POA: Diagnosis not present

## 2018-05-20 DIAGNOSIS — R112 Nausea with vomiting, unspecified: Secondary | ICD-10-CM | POA: Diagnosis not present

## 2018-05-20 LAB — URINALYSIS, ROUTINE W REFLEX MICROSCOPIC
Bilirubin Urine: NEGATIVE
Glucose, UA: NEGATIVE mg/dL
Hgb urine dipstick: NEGATIVE
Ketones, ur: NEGATIVE mg/dL
Nitrite: NEGATIVE
Protein, ur: NEGATIVE mg/dL
Specific Gravity, Urine: 1.016 (ref 1.005–1.030)
pH: 6 (ref 5.0–8.0)

## 2018-05-20 LAB — HIV ANTIBODY (ROUTINE TESTING W REFLEX): HIV Screen 4th Generation wRfx: NONREACTIVE

## 2018-05-20 LAB — COMPREHENSIVE METABOLIC PANEL
ALT: 94 U/L — ABNORMAL HIGH (ref 0–44)
AST: 45 U/L — ABNORMAL HIGH (ref 15–41)
Albumin: 4.4 g/dL (ref 3.5–5.0)
Alkaline Phosphatase: 144 U/L — ABNORMAL HIGH (ref 38–126)
Anion gap: 8 (ref 5–15)
BUN: 55 mg/dL — ABNORMAL HIGH (ref 6–20)
CO2: 22 mmol/L (ref 22–32)
Calcium: 10.8 mg/dL — ABNORMAL HIGH (ref 8.9–10.3)
Chloride: 110 mmol/L (ref 98–111)
Creatinine, Ser: 1.62 mg/dL — ABNORMAL HIGH (ref 0.44–1.00)
GFR calc Af Amer: 40 mL/min — ABNORMAL LOW (ref >60)
GFR calc non Af Amer: 35 mL/min — ABNORMAL LOW (ref >60)
Glucose, Bld: 132 mg/dL — ABNORMAL HIGH (ref 70–99)
Potassium: 3.2 mmol/L — ABNORMAL LOW (ref 3.5–5.1)
Sodium: 140 mmol/L (ref 135–145)
Total Bilirubin: 1 mg/dL (ref 0.3–1.2)
Total Protein: 7 g/dL (ref 6.5–8.1)

## 2018-05-20 LAB — CBC
HCT: 43.8 % (ref 36.0–46.0)
HCT: 40.5 % (ref 36.0–46.0)
Hemoglobin: 13.4 g/dL (ref 12.0–15.0)
Hemoglobin: 12.6 g/dL (ref 12.0–15.0)
MCH: 27.5 pg (ref 26.0–34.0)
MCH: 27.6 pg (ref 26.0–34.0)
MCHC: 30.6 g/dL (ref 30.0–36.0)
MCHC: 31.1 g/dL (ref 30.0–36.0)
MCV: 89.8 fL (ref 80.0–100.0)
MCV: 88.8 fL (ref 80.0–100.0)
Platelets: 579 10*3/uL — ABNORMAL HIGH (ref 150–400)
Platelets: 519 10*3/uL — ABNORMAL HIGH (ref 150–400)
RBC: 4.88 MIL/uL (ref 3.87–5.11)
RBC: 4.56 MIL/uL (ref 3.87–5.11)
RDW: 13.2 % (ref 11.5–15.5)
RDW: 13.3 % (ref 11.5–15.5)
WBC: 11.5 10*3/uL — ABNORMAL HIGH (ref 4.0–10.5)
WBC: 11.3 10*3/uL — ABNORMAL HIGH (ref 4.0–10.5)
nRBC: 0 % (ref 0.0–0.2)
nRBC: 0 % (ref 0.0–0.2)

## 2018-05-20 LAB — GASTROINTESTINAL PANEL BY PCR, STOOL (REPLACES STOOL CULTURE)

## 2018-05-20 LAB — CBG MONITORING, ED: Glucose-Capillary: 111 mg/dL — ABNORMAL HIGH (ref 70–99)

## 2018-05-20 LAB — LITHIUM LEVEL
Lithium Lvl: 1.8 mmol/L (ref 0.60–1.20)
Lithium Lvl: 1.71 mmol/L (ref 0.60–1.20)
Lithium Lvl: 1.29 mmol/L — ABNORMAL HIGH (ref 0.60–1.20)

## 2018-05-20 LAB — GLUCOSE, CAPILLARY
Glucose-Capillary: 118 mg/dL — ABNORMAL HIGH (ref 70–99)
Glucose-Capillary: 109 mg/dL — ABNORMAL HIGH (ref 70–99)
Glucose-Capillary: 147 mg/dL — ABNORMAL HIGH (ref 70–99)
Glucose-Capillary: 105 mg/dL — ABNORMAL HIGH (ref 70–99)

## 2018-05-20 LAB — BASIC METABOLIC PANEL
Anion gap: 11 (ref 5–15)
BUN: 46 mg/dL — ABNORMAL HIGH (ref 6–20)
CO2: 20 mmol/L — ABNORMAL LOW (ref 22–32)
Calcium: 10 mg/dL (ref 8.9–10.3)
Chloride: 108 mmol/L (ref 98–111)
Creatinine, Ser: 1.38 mg/dL — ABNORMAL HIGH (ref 0.44–1.00)
GFR calc Af Amer: 49 mL/min — ABNORMAL LOW (ref >60)
GFR calc non Af Amer: 42 mL/min — ABNORMAL LOW (ref >60)
Glucose, Bld: 131 mg/dL — ABNORMAL HIGH (ref 70–99)
Potassium: 3.4 mmol/L — ABNORMAL LOW (ref 3.5–5.1)
Sodium: 139 mmol/L (ref 135–145)

## 2018-05-20 LAB — MAGNESIUM: Magnesium: 2.8 mg/dL — ABNORMAL HIGH (ref 1.7–2.4)

## 2018-05-20 LAB — MRSA PCR SCREENING: MRSA by PCR: NEGATIVE

## 2018-05-20 LAB — LIPASE, BLOOD: Lipase: 92 U/L — ABNORMAL HIGH (ref 11–51)

## 2018-05-20 LAB — C DIFFICILE QUICK SCREEN W PCR REFLEX
C Diff antigen: NEGATIVE
C Diff interpretation: NOT DETECTED
C Diff toxin: NEGATIVE

## 2018-05-20 MED ORDER — KCL IN DEXTROSE-NACL 40-5-0.9 MEQ/L-%-% IV SOLN
INTRAVENOUS | Status: DC
  Administered 2018-05-20 – 2018-05-22 (×6): via INTRAVENOUS
  Filled 2018-05-20 (×7): qty 1000

## 2018-05-20 MED ORDER — ENOXAPARIN SODIUM 40 MG/0.4ML ~~LOC~~ SOLN
40.0000 mg | Freq: Every day | SUBCUTANEOUS | Status: DC
  Administered 2018-05-20 – 2018-05-22 (×3): 40 mg via SUBCUTANEOUS
  Filled 2018-05-20 (×3): qty 0.4

## 2018-05-20 MED ORDER — INSULIN ASPART 100 UNIT/ML ~~LOC~~ SOLN: 0.0000 [IU] | SUBCUTANEOUS | Status: DC

## 2018-05-20 MED ORDER — ACETAMINOPHEN 325 MG PO TABS: 650.0000 mg | ORAL_TABLET | Freq: Four times a day (QID) | ORAL | Status: DC | PRN

## 2018-05-20 MED ORDER — BOOST / RESOURCE BREEZE PO LIQD CUSTOM
1.0000 | Freq: Three times a day (TID) | ORAL | Status: DC
  Administered 2018-05-20 – 2018-05-22 (×3): 1 via ORAL

## 2018-05-20 MED ORDER — PROMETHAZINE HCL 25 MG/ML IJ SOLN
25.0000 mg | Freq: Four times a day (QID) | INTRAMUSCULAR | Status: DC | PRN
  Filled 2018-05-20: qty 1

## 2018-05-20 MED ORDER — ACETAMINOPHEN 650 MG RE SUPP: 650.0000 mg | Freq: Four times a day (QID) | RECTAL | Status: DC | PRN

## 2018-05-20 MED ORDER — PROMETHAZINE HCL 25 MG/ML IJ SOLN
12.5000 mg | Freq: Once | INTRAMUSCULAR | Status: AC
  Administered 2018-05-20: 12.5 mg via INTRAVENOUS
  Filled 2018-05-20: qty 1

## 2018-05-20 MED ORDER — SODIUM CHLORIDE 0.9 % IV SOLN
INTRAVENOUS | Status: DC
  Administered 2018-05-20 (×2): via INTRAVENOUS

## 2018-05-20 MED ORDER — ONDANSETRON HCL 4 MG/2ML IJ SOLN: 4.0000 mg | Freq: Four times a day (QID) | INTRAMUSCULAR | Status: DC | PRN

## 2018-05-20 MED ORDER — ONDANSETRON HCL 4 MG/2ML IJ SOLN
4.0000 mg | Freq: Four times a day (QID) | INTRAMUSCULAR | Status: DC
  Administered 2018-05-20 – 2018-05-22 (×8): 4 mg via INTRAVENOUS
  Filled 2018-05-20 (×8): qty 2

## 2018-05-20 MED ORDER — POTASSIUM CHLORIDE 10 MEQ/100ML IV SOLN
10.0000 meq | INTRAVENOUS | Status: AC
  Administered 2018-05-20 (×3): 10 meq via INTRAVENOUS
  Filled 2018-05-20 (×3): qty 100

## 2018-05-20 MED ORDER — SODIUM CHLORIDE 0.9 % IV BOLUS
1000.0000 mL | Freq: Once | INTRAVENOUS | Status: AC
  Administered 2018-05-20: 1000 mL via INTRAVENOUS

## 2018-05-20 MED ORDER — INSULIN ASPART 100 UNIT/ML ~~LOC~~ SOLN: 0.0000 [IU] | Freq: Three times a day (TID) | SUBCUTANEOUS | Status: DC

## 2018-05-20 NOTE — Progress Notes (Signed)
CRITICAL VALUE ALERT  Critical Value:  Lithium: 1.8  Date & Time Notied:  05/20/2018, 0515  Provider Notified: [facility]  Orders Received/Actions taken: awaiting

## 2018-05-20 NOTE — Progress Notes (Addendum)
Nutrition Brief Note  Patient identified on the Malnutrition Screening Tool (MST) Report  Wt Readings from Last 15 Encounters:  05/20/18 106.1 kg  04/26/18 119.1 kg  10/08/16 112 kg  10/02/16 112 kg  08/14/16 109.8 kg  08/06/16 109.8 kg  02/15/16 110.7 kg  02/13/16 111.1 kg  11/21/14 108.9 kg  06/19/14 109.3 kg  05/31/14 110.2 kg  08/03/13 108.9 kg  06/13/13 109.3 kg   Leah Powers is a 58 y.o. female with medical history significant of DM2, BPD, HTN, hypothyroidism.  Patient presents to ED at advice of PCP.  Patient with h/o nausea, diarrhea, and a few episodes of emesis for the past 1.5 weeks.   Pt admitted with nausea, vomiting, and diarrhea.   Case discussed with RN prior to visit, who reports that pt is making good progress- only a small instance of vomiting this AM. She has just been advanced to a soft diet.   Spoke with pt at bedside, who was in good spirits at time of visit. She reports feeling unwell over the past 2 weeks, due to progressive illness (ear infection and nausea, vomiting, diarrhea). Her intake has been decreased due to feeling unwell. Prior to acute illness, pt reports that she had been making lifestyle modifications due to being diagnosed with DM approximately 3-4 months ago. She shares she has cut back on fried food and including more whole grains, and fresh fruits/evegetables in her diet. She has also eliminated sugar-sweetened beverages. She is being followed by RD Alric Quan) at [facility] for outpatient diabetes education. Pt has found these appointments helpful and was able to teachback to this RD DM self-management basics. Reinforced importance of self-management to optimize DM control and prevent further complications.   Pt reports her UBW is around 230# and had been losing weight secondary to lifestyle changes (pt reports she was 265# at the time of her DM diagnosis). Per wt hx, noted that pt has  experienced a 10.9% wt loss over the past month, however, unsure of accuracy of wt. Per wt hx, wt typically ranges from 108-112 kg, so suspect wt of 119.1 kg may be an outlier.   Nutrition-Focused physical exam completed. Findings are no fat depletion, no muscle depletion, and mild edema.   Lab Results  Component Value Date   HGBA1C 5.9 (H) 05/26/2014   PTA DM medications are none. Pt reports she was previously prescribed metformin, but is no longer taking it.    Labs reviewed: CBGS: 109 (inpatient orders for glycemic control are 0-9 units insulin aspart TID with meals).   Body mass index is 34.54 kg/m. Patient meets criteria for obesity, class I based on current BMI.   Current diet order is soft, patient is consuming approximately n/a% of meals at this time. Labs and medications reviewed.   No nutrition interventions warranted at this time. If nutrition issues arise, please consult RD.   Antawn Sison A. Jimmye Norman, RD, LDN, [place] Registered Dietitian II Certified Diabetes Care and Education Specialist Pager: [phone] After hours Pager: [phone]

## 2018-05-20 NOTE — Progress Notes (Signed)
[phone] for updates D/T increased  lithium levels. Poison control is following patient. Especially call if patient develops confusion or tremors.

## 2018-05-20 NOTE — Progress Notes (Signed)
GI panel negative, C. Diff negative. MD paged.

## 2018-05-20 NOTE — ED Notes (Signed)
Informed pt we need urine sample 

## 2018-05-20 NOTE — Progress Notes (Addendum)
PROGRESS NOTE    [name]   DGL:[id number]  DOB: 12/17/1960  DOA: 05/19/2018 PCP: [facility]   Brief Narrative:  Leah Powers is a 58 year old female with diabetes mellitus type 2, hypertension, hypothyroidism who presents with nausea vomiting diarrhea started a few weeks ago but got worse after she took a 5-day course of Levaquin and influenza.  She states the last episode of diarrhea was a couple of days before coming into the hospital however she continues to have vomiting and abdominal pain and feels dehydrated. In addition to above her lithium level was noted to be elevated by her PCP and she was asked to come to the hospital for this.   Subjective: Had bilious vomiting this AM but is also hungry. She had a normal soft BM today.     Assessment & Plan:   Principal Problem:   Nausea vomiting and diarrhea causing AKI (acute kidney injury)   - diarrhea resolved but she continues to vomit and have abdominal pain - start Zofran QID for nausea, vomiting- she can have soft foods as she is hungry- she knows to stop if she has more vomiting - AKI improving but not resolved - baseline Cr ~ 0.75   Active Problems: Thrombocytosis -  follow  Hypokalemia - replace via IVF  Elevated Lithium level- Bipolar I disorder   - holding Lithium - follow levels    HTN (hypertension) - hold HCTZ ad Benazepril    Hypothyroidism - cont Synthroid   Time spent in minutes: 35 DVT prophylaxis: Lovenox Code Status: Full code Family Communication: no family at bedside Disposition Plan: home when able to tolerate diet Consultants:   none Procedures:   none Antimicrobials:  Anti-infectives (From admission, onward)   None       Objective: Vitals:   05/20/18 0445 05/20/18 0457 05/20/18 0935 05/20/18 1217  BP:  134/69 120/76 (!) 147/68  Pulse:  74 73 76  Resp:  20 18 18   Temp:  100.1 F (37.8 C) 98.7 F (37.1 C) 98.2 F (36.8 C)  TempSrc:   Oral Oral Oral  SpO2:  100% 100% 98%  Weight: 106.1 kg     Height: 5\' 9"  (1.753 m)       Intake/Output Summary (Last 24 hours) at 05/20/2018 1347 Last data filed at 05/20/2018 1231 Gross per 24 hour  Intake 1195.13 ml  Output 950 ml  Net 245.13 ml   Filed Weights   05/20/18 0251 05/20/18 0445  Weight: 105.2 kg 106.1 kg    Examination: General exam: Appears comfortable  HEENT: PERRLA, oral mucosa moist, no sclera icterus or thrush Respiratory system: Clear to auscultation. Respiratory effort normal. Cardiovascular system: S1 & S2 heard, RRR.   Gastrointestinal system: Abdomen soft, mid abdominal pain, nondistended. Normal bowel sounds. Central nervous system: Alert and oriented. No focal neurological deficits. Extremities: No cyanosis, clubbing or edema Skin: No rashes or ulcers Psychiatry:  Mood & affect appropriate.     Data Reviewed: I have personally reviewed following labs and imaging studies  CBC: Recent Labs  Lab 05/19/18 2307 05/20/18 0748  WBC 11.5* 11.3*  HGB 13.4 12.6  HCT 43.8 40.5  MCV 89.8 88.8  PLT 579* 518*   Basic Metabolic Panel: Recent Labs  Lab 05/19/18 2307 05/20/18 0339 05/20/18 0748  NA 140  --  139  K 3.2*  --  3.4*  CL 110  --  108  CO2 22  --  20*  GLUCOSE 132*  --  131*  BUN 55*  --  46*  CREATININE 1.62*  --  1.38*  CALCIUM 10.8*  --  10.0  MG  --  2.8*  --    GFR: Estimated Creatinine Clearance: 58.4 mL/min (A) (by C-G formula based on SCr of 1.38 mg/dL (H)). Liver Function Tests: Recent Labs  Lab 05/19/18 2307  AST 45*  ALT 94*  ALKPHOS 144*  BILITOT 1.0  PROT 7.0  ALBUMIN 4.4   Recent Labs  Lab 05/19/18 2307  LIPASE 92*   No results for input(s): AMMONIA in the last 168 hours. Coagulation Profile: No results for input(s): INR, PROTIME in the last 168 hours. Cardiac Enzymes: No results for input(s): CKTOTAL, CKMB, CKMBINDEX, TROPONINI in the last 168 hours. BNP (last 3 results) No results for input(s): PROBNP  in the last 8760 hours. HbA1C: No results for input(s): HGBA1C in the last 72 hours. CBG: Recent Labs  Lab 05/20/18 0409 05/20/18 0915 05/20/18 1212  GLUCAP 111* 118* 109*   Lipid Profile: No results for input(s): CHOL, HDL, LDLCALC, TRIG, CHOLHDL, LDLDIRECT in the last 72 hours. Thyroid Function Tests: No results for input(s): TSH, T4TOTAL, FREET4, T3FREE, THYROIDAB in the last 72 hours. Anemia Panel: No results for input(s): VITAMINB12, FOLATE, FERRITIN, TIBC, IRON, RETICCTPCT in the last 72 hours. Urine analysis:    Component Value Date/Time   COLORURINE YELLOW 05/20/2018 0512   APPEARANCEUR HAZY (A) 05/20/2018 0512   LABSPEC 1.016 05/20/2018 0512   PHURINE 6.0 05/20/2018 0512   GLUCOSEU NEGATIVE 05/20/2018 0512   HGBUR NEGATIVE 05/20/2018 0512   BILIRUBINUR NEGATIVE 05/20/2018 0512   KETONESUR NEGATIVE 05/20/2018 0512   PROTEINUR NEGATIVE 05/20/2018 0512   UROBILINOGEN 1.0 06/12/2011 1404   NITRITE NEGATIVE 05/20/2018 0512   LEUKOCYTESUR SMALL (A) 05/20/2018 0512   Sepsis Labs: @LABRCNTIP (procalcitonin:4,lacticidven:4) ) Recent Results (from the past 240 hour(s))  C difficile quick scan w PCR reflex     Status: None   Collection Time: 05/20/18  3:19 AM  Result Value Ref Range Status   C Diff antigen NEGATIVE NEGATIVE Final   C Diff toxin NEGATIVE NEGATIVE Final   C Diff interpretation No C. difficile detected.  Final    Comment: Performed at [facility], [place] [address], [place], [place] [address]         Radiology Studies: No results found.    Scheduled Meds: . enoxaparin (LOVENOX) injection  40 mg Subcutaneous Daily  . feeding supplement  1 Container Oral TID BM  . insulin aspart  0-9 Units Subcutaneous TID WC  . ondansetron (ZOFRAN) IV  4 mg Intravenous Q6H  . sodium chloride flush  3 mL Intravenous Once   Continuous Infusions: . dextrose 5 % and 0.9 % NaCl with KCl 40 mEq/L 125 mL/hr at 05/20/18 1222     LOS: 0 days      Debbe Odea, MD [facility] Pager: [email or web address] Password [email or web address] 05/20/2018, 1:47 PM

## 2018-05-20 NOTE — Progress Notes (Signed)
Patient says watery stool was about 2 days ago. Had a BM this morning and it was soft/formed. GI panel sent down. Patient says she still gets a little nauseous.

## 2018-05-20 NOTE — Plan of Care (Signed)
  Problem: Education: Goal: Ability to demonstrate management of disease process will improve Outcome: Progressing Goal: Ability to verbalize understanding of medication therapies will improve Outcome: Progressing Goal: Individualized Educational Video(s) Outcome: Progressing   Problem: Activity: Goal: Capacity to carry out activities will improve Outcome: Progressing   Problem: Cardiac: Goal: Ability to achieve and maintain adequate cardiopulmonary perfusion will improve Outcome: Progressing   Problem: Education: Goal: Knowledge of General Education information will improve Description: Including pain rating scale, medication(s)/side effects and non-pharmacologic comfort measures Outcome: Progressing   Problem: Health Behavior/Discharge Planning: Goal: Ability to manage health-related needs will improve Outcome: Progressing   Problem: Clinical Measurements: Goal: Ability to maintain clinical measurements within normal limits will improve Outcome: Progressing Goal: Will remain free from infection Outcome: Progressing Goal: Diagnostic test results will improve Outcome: Progressing Goal: Respiratory complications will improve Outcome: Progressing Goal: Cardiovascular complication will be avoided Outcome: Progressing   Problem: Activity: Goal: Risk for activity intolerance will decrease Outcome: Progressing   Problem: Nutrition: Goal: Adequate nutrition will be maintained Outcome: Progressing   Problem: Coping: Goal: Level of anxiety will decrease Outcome: Progressing   Problem: Pain Managment: Goal: General experience of comfort will improve Outcome: Progressing   Problem: Safety: Goal: Ability to remain free from injury will improve Outcome: Progressing   Problem: Skin Integrity: Goal: Risk for impaired skin integrity will decrease Outcome: Progressing   

## 2018-05-20 NOTE — H&P (Signed)
History and Physical    Leah Powers MVH:[id number] DOB: 1961/02/23 DOA: 05/19/2018  [facility]P: [facility]  Patient coming from: Home  I have personally briefly reviewed patient's old medical records in [place]  Chief Complaint: Diarrhea  HPI: Leah Powers is a 58 y.o. female with medical history significant of DM2, BPD, HTN, hypothyroidism.  Patient presents to ED at advice of PCP.  Patient with h/o nausea, diarrhea, and a few episodes of emesis for the past 1.5 weeks.  On 2/23 seen at [facility], diagnosed with pneumonia.  Put on 5 day course of levaquin and tamiflu (husband had influenza).  Diarrhea onset and began to worsen on these medications.  No respiratory complaints currently but continues to experience watery, NB diarrhea.  Persistent nausea, sporadic emesis, tolerates fluids intermittently.  Occasional discomfort to left upper abdomen.  Went to PCP, lithium level was 1.9, sent in to ED.   ED Course: Creat 1.6 up from baseline of 0.8.  Lipase 92, AST 45 ALT 94 ALK 144.  K 3.2.  Put on IVF, GI pathogen pnl pending.   Review of Systems: As per HPI otherwise 10 point review of systems negative.   Past Medical History:  Diagnosis Date  . Arthritis    L hand, R ankle  (10/08/2016)  . Bipolar 1 disorder ([place])   . Diabetes mellitus without complication ([place])   . Fibromyalgia    told that the breakout on the upper back , could be over active nerves , also treated with cream & Gabapentin- [facility] [place]   . History of kidney stones    spontaneous passing  . Hypertension   . Hypothyroid   . Pneumonia ~ 1967; 07/2016  . Pyelonephritis 1997   treated with IV antibiotic     Past Surgical History:  Procedure Laterality Date  . BLADDER INSTILLATION  X 2  . CHALAZION EXCISION Bilateral    R- eye X2 , L eye- X1  . FOOT ARTHRODESIS Right 10/08/2016   Procedure: RIGHT TRIPLE ARTHRODESIS;  Surgeon: Newt Minion, MD;  Location: [facility];  Service: Orthopedics;  Laterality: Right;  . FOOT ARTHRODESIS, TRIPLE Right 10/08/2016   ankle  . FRENULECTOMY, LINGUAL  1970s  . LAPAROSCOPIC CHOLECYSTECTOMY  1996  . PALATE SURGERY  1970s   tissue removed from upper palate as a child   . URETHRAL DILATION       reports that she has never smoked. She has never used smokeless tobacco. She reports that she does not drink alcohol or use drugs.  Allergies  Allergen Reactions  . Eucalyptus Oil Shortness Of Breath  . Haldol [Haloperidol Decanoate] Other (See Comments)    Drooling, weaving in floor, parkinson's syndrome (couldn't eat or talk) , pt was hospitalized   . Molds & Smuts Shortness Of Breath and Itching  . Other Anaphylaxis, Shortness Of Breath, Itching and Swelling    Bleu cheese   . Penicillins Anaphylaxis and Rash    Pt was 58 years old  Has patient had a PCN reaction causing immediate rash, facial/tongue/throat swelling, SOB or lightheadedness with hypotension: Yes Has patient had a PCN reaction causing severe rash involving mucus membranes or skin necrosis: Unknown Has patient had a PCN reaction that required hospitalization: No Has patient had a PCN reaction occurring within the last 10 years: No If all of the above answers are "NO", then may proceed with Cephalosporin use.   Raelyn Ensign Venom Other (See Comments)  Yellow jackets and wasps-stung by 37, MD told pt that she wouldn't have resistance if stung again   . Corn-Containing Products Nausea And Vomiting and Other (See Comments)    Stomach distress   . Demeclocycline Other (See Comments)  . Dolobid [Diflunisal] Other (See Comments)    Upset stomach  . Vicodin [Hydrocodone-Acetaminophen] Diarrhea and Other (See Comments)    Upset stomach  . Doxycycline Rash  . Erythromycin Rash  . Keflex [Cephalexin] Rash  . Macrodantin Rash  . Motrin [Ibuprofen] Palpitations  . Septra [Bactrim] Rash  . Tetracyclines & Related Rash  . Tofranil-Pm Rash    Family History    Problem Relation Age of Onset  . Breast cancer Maternal Aunt   . Breast cancer Maternal Grandmother   . Sleep apnea Father   . Hypertension Father   . Diabetes Sister   . Hypertension Sister   . Asthma Sister   . Cancer Paternal Grandfather      Prior to Admission medications   Medication Sig Start Date End Date Taking? Authorizing Provider  Acetylcysteine (NAC 600 PO) Take by mouth daily.    [provider]  benazepril (LOTENSIN) 10 MG tablet TAKE ONE TABLET BY MOUTH ONCE DAILY 10/29/15   Susy Frizzle, MD  cetirizine (ZYRTEC) 5 MG tablet Take 5 mg by mouth 2 (two) times daily as needed for allergies.     [provider]  fluvoxaMINE (LUVOX) 50 MG tablet Take 1 tablet (50 mg total) by mouth at bedtime. 01/28/18   Donnal Moat T, PA-C  gabapentin (NEURONTIN) 100 MG capsule Take 100-200 mg by mouth 3 (three) times daily. 1 in am and 2 in the evening , and sometimes takes 1 capsule three times daily 07/17/16   [provider]  hydrochlorothiazide (HYDRODIURIL) 25 MG tablet TAKE ONE TABLET BY MOUTH ONCE DAILY 09/18/16   Dena Billet B, PA-C  Hypromellose (ARTIFICIAL TEARS OP) Place 1 drop into both eyes 2 (two) times daily.    [provider]  levothyroxine (SYNTHROID) 150 MCG tablet Take 1 tablet (150 mcg total) by mouth daily before breakfast. 02/19/18   Donnal Moat T, PA-C  lithium carbonate 300 MG capsule Take 900 mg by mouth daily with lunch.     [provider]  lithium carbonate 300 MG capsule TAKE 3 CAPSULES BY MOUTH ONCE DAILY 02/05/18   Donnal Moat T, PA-C  metFORMIN (GLUCOPHAGE) 500 MG tablet Take by mouth.     [provider]  naproxen sodium (ANAPROX) 220 MG tablet Take 2 tablets (440 mg total) by mouth every 12 (twelve) hours as needed (pain). Patient taking differently: Take 440 mg by mouth 3 (three) times daily as needed (pain).  06/12/11   Charlena Cross, MD    Physical Exam: Vitals:   05/19/18 2300 05/20/18 0251   BP: (!) 141/72 (!) 118/98  Pulse: 84 74  Resp: 20 20  Temp: 98.1 F (36.7 C)   TempSrc: Oral   SpO2: 96% 100%  Weight:  105.2 kg  Height:  '5\' 9"'$  (1.753 m)    Constitutional: NAD, calm, comfortable Eyes: PERRL, lids and conjunctivae normal ENMT: Mucous membranes are moist. Posterior pharynx clear of any exudate or lesions.Normal dentition.  Neck: normal, supple, no masses, no thyromegaly Respiratory: clear to auscultation bilaterally, no wheezing, no crackles. Normal respiratory effort. No accessory muscle use.  Cardiovascular: Regular rate and rhythm, no murmurs / rubs / gallops. No extremity edema. 2+ pedal pulses. No carotid bruits.  Abdomen: no  tenderness, no masses palpated. No hepatosplenomegaly. Bowel sounds positive.  Musculoskeletal: no clubbing / cyanosis. No joint deformity upper and lower extremities. Good ROM, no contractures. Normal muscle tone.  Skin: no rashes, lesions, ulcers. No induration Neurologic: CN 2-12 grossly intact. Sensation intact, DTR normal. Strength 5/5 in all 4.  Psychiatric: Normal judgment and insight. Alert and oriented x 3. Normal mood.    Labs on Admission: I have personally reviewed following labs and imaging studies  CBC: Recent Labs  Lab 05/19/18 2307  WBC 11.5*  HGB 13.4  HCT 43.8  MCV 89.8  PLT 846*   Basic Metabolic Panel: Recent Labs  Lab 05/19/18 2307  NA 140  K 3.2*  CL 110  CO2 22  GLUCOSE 132*  BUN 55*  CREATININE 1.62*  CALCIUM 10.8*   GFR: Estimated Creatinine Clearance: 49.5 mL/min (A) (by C-G formula based on SCr of 1.62 mg/dL (H)). Liver Function Tests: Recent Labs  Lab 05/19/18 2307  AST 45*  ALT 94*  ALKPHOS 144*  BILITOT 1.0  PROT 7.0  ALBUMIN 4.4   Recent Labs  Lab 05/19/18 2307  LIPASE 92*   No results for input(s): AMMONIA in the last 168 hours. Coagulation Profile: No results for input(s): INR, PROTIME in the last 168 hours. Cardiac Enzymes: No results for input(s): CKTOTAL, CKMB,  CKMBINDEX, TROPONINI in the last 168 hours. BNP (last 3 results) No results for input(s): PROBNP in the last 8760 hours. HbA1C: No results for input(s): HGBA1C in the last 72 hours. CBG: No results for input(s): GLUCAP in the last 168 hours. Lipid Profile: No results for input(s): CHOL, HDL, LDLCALC, TRIG, CHOLHDL, LDLDIRECT in the last 72 hours. Thyroid Function Tests: No results for input(s): TSH, T4TOTAL, FREET4, T3FREE, THYROIDAB in the last 72 hours. Anemia Panel: No results for input(s): VITAMINB12, FOLATE, FERRITIN, TIBC, IRON, RETICCTPCT in the last 72 hours. Urine analysis:    Component Value Date/Time   COLORURINE YELLOW 06/12/2011 1404   APPEARANCEUR CLOUDY (A) 06/12/2011 1404   LABSPEC 1.022 06/12/2011 1404   PHURINE 6.0 06/12/2011 1404   GLUCOSEU NEGATIVE 06/12/2011 1404   HGBUR LARGE (A) 06/12/2011 1404   BILIRUBINUR NEGATIVE 06/12/2011 1404   KETONESUR NEGATIVE 06/12/2011 1404   PROTEINUR NEGATIVE 06/12/2011 1404   UROBILINOGEN 1.0 06/12/2011 1404   NITRITE NEGATIVE 06/12/2011 1404   LEUKOCYTESUR MODERATE (A) 06/12/2011 1404    Radiological Exams on Admission: No results found.  EKG: Independently reviewed.  Assessment/Plan Principal Problem:   Nausea vomiting and diarrhea Active Problems:   HTN (hypertension)   Hypothyroidism   Bipolar I disorder (H[facility])   AKI (acute kidney injury) ([place])   Lithium toxicity   DM2 (diabetes mellitus, type 2) ([place])    1. N/V/D - 1. Cdiff pending 2. GI pathogen pnl pending 3. Will hold off on ABx for the moment 4. Clear liquid diet 5. zofran PRN 2. AKI - due to dehydration / pre-renal from #1 above 1. IVF: NS at 125 cc/hr 2. Intake and output 3. Repeat CMP tomorrow AM (also to keep eye on that mild transaminitis) 3. Lithium toxicity - 1. As per poison control recs: 2. Repeat lithium level now, trend serial levels till it starts to decrease 3. EKG Q4H 4. Continue IVF 5. Replace K and Mg (Mg level pending) 4. BPD  1 - Hold lithium 5. Hypothyroidism - 1. Continue synthroid 6. DM2 - 1. Hold metformin 2. Sensitive SSI q4h 7. HTN - 1. Hold ACEI and HCTZ  DVT prophylaxis: Lovenox Code  Status: Full Family Communication: Husband at bedside Disposition Plan: Home after admit Consults called: None Admission status: Place in 107    Tiari Andringa, [place] [facility]  How to contact the [facility] Attending or Consulting provider [place] or covering provider during after hours [place], for this patient?  1. Check the care team in [facility] and look for a) attending/consulting [facility] provider listed and b) the [facility] team listed 2. Log into [email or web address]  Amion Physician Scheduling and messaging for groups and whole hospitals  On call and physician scheduling software for group practices, residents, hospitalists and other medical providers for call, clinic, rotation and shift schedules. On[employer] is a hospital-wide system for scheduling doctors and paging doctors on call. EasyPlot is for scientific plotting and data analysis.  [email or web address]  and use [place] universal password to access. If you do not have the password, please contact the hospital operator.  3. Locate the [facility] provider you are looking for under [facility] and page to a number that you can be directly reached. 4. If you still have difficulty reaching the provider, please page the [facility] (Director on Call) for the Hospitalists listed on amion for assistance.  05/20/2018, 4:10 AM

## 2018-05-20 NOTE — ED Provider Notes (Signed)
[facility] EMERGENCY DEPARTMENT Provider Note   CSN: [id number] Arrival date & time: 05/19/18  2250    History   Chief Complaint Chief Complaint  Patient presents with  . Diarrhea    HPI Leah Powers is a 58 y.o. female.    59 year old female with a history of diabetes, hypertension, hypothyroid, bipolar 1 disorder on chronic lithium presents to the emergency department at the advice of her primary care doctor.  Patient notes onset of nausea and diarrhea x1.5 weeks.  She was seen at urgent care on 05/09/18 and diagnosed with pneumonia.  She was placed on a 5-day course of Levaquin in addition to a 5-day course of Tamiflu given exposure to husband who was diagnosed with influenza.  Patient states that her diarrhea began to worsen while on these medications.  She continues to experience watery, nonbloody diarrhea with approximately 8 episodes in the past 24 hours.  She has had persistent nausea with sporadic emesis.  Has been able to tolerate fluids intermittently, but has had less of an appetite.  Notes some discomfort to her left upper abdomen which is intermittent and without modifying factors.  The patient has continued to feel generally unwell, prompting her visit to her primary care doctor today.  She was advised to come to the emergency department after it was found that her lithium level was 1.9.  No fevers, syncope, urinary symptoms.  The history is provided by the patient and the spouse. No language interpreter was used.  Diarrhea    Past Medical History:  Diagnosis Date  . Arthritis    L hand, R ankle  (10/08/2016)  . Bipolar 1 disorder ([place])   . Diabetes mellitus without complication ([place])   . Fibromyalgia    told that the breakout on the upper back , could be over active nerves , also treated with cream & Gabapentin- [facility] [place]   . History of kidney stones    spontaneous passing  . Hypertension   . Hypothyroid   . Pneumonia ~ 1967;  07/2016  . Pyelonephritis 1997   treated with IV antibiotic     Patient Active Problem List   Diagnosis Date Noted  . Bipolar I disorder ([place]) 02/04/2018  . S/P ankle fusion 11/12/2016  . Posterior tibial tendon dysfunction (PTTD) of right lower extremity 10/08/2016  . Posterior tibial tendinitis, right leg   . HTN (hypertension) 05/31/2014  . Hypothyroidism 05/31/2014    Past Surgical History:  Procedure Laterality Date  . BLADDER INSTILLATION  X 2  . CHALAZION EXCISION Bilateral    R- eye X2 , L eye- X1  . FOOT ARTHRODESIS Right 10/08/2016   Procedure: RIGHT TRIPLE ARTHRODESIS;  Surgeon: Newt Minion, MD;  Location: [facility];  Service: Orthopedics;  Laterality: Right;  . FOOT ARTHRODESIS, TRIPLE Right 10/08/2016   ankle  . FRENULECTOMY, LINGUAL  1970s  . LAPAROSCOPIC CHOLECYSTECTOMY  1996  . PALATE SURGERY  1970s   tissue removed from upper palate as a child   . URETHRAL DILATION       OB History   No obstetric history on file.      Home Medications    Prior to Admission medications   Medication Sig Start Date End Date Taking? Authorizing Provider  Acetylcysteine (NAC 600 PO) Take by mouth daily.    [provider]  benazepril (LOTENSIN) 10 MG tablet TAKE ONE TABLET BY MOUTH ONCE DAILY 10/29/15   Susy Frizzle, MD  cetirizine (ZYRTEC) 5 MG tablet Take 5 mg by mouth 2 (two) times daily as needed for allergies.     [provider]  fluvoxaMINE (LUVOX) 50 MG tablet Take 1 tablet (50 mg total) by mouth at bedtime. 01/28/18   Donnal Moat T, PA-C  gabapentin (NEURONTIN) 100 MG capsule Take 100-200 mg by mouth 3 (three) times daily. 1 in am and 2 in the evening , and sometimes takes 1 capsule three times daily 07/17/16   [provider]  hydrochlorothiazide (HYDRODIURIL) 25 MG tablet TAKE ONE TABLET BY MOUTH ONCE DAILY 09/18/16   Dena Billet B, PA-C  Hypromellose (ARTIFICIAL TEARS OP) Place 1 drop into both eyes 2 (two) times daily.    [provider]  levothyroxine (SYNTHROID) 150 MCG tablet Take 1 tablet (150 mcg total) by mouth daily before breakfast. 02/19/18   Donnal Moat T, PA-C  lithium carbonate 300 MG capsule Take 900 mg by mouth daily with lunch.     [provider]  lithium carbonate 300 MG capsule TAKE 3 CAPSULES BY MOUTH ONCE DAILY 02/05/18   Donnal Moat T, PA-C  metFORMIN (GLUCOPHAGE) 500 MG tablet Take by mouth.     [provider]  naproxen sodium (ANAPROX) 220 MG tablet Take 2 tablets (440 mg total) by mouth every 12 (twelve) hours as needed (pain). Patient taking differently: Take 440 mg by mouth 3 (three) times daily as needed (pain).  06/12/11   Charlena Cross, MD    Family History Family History  Problem Relation Age of Onset  . Breast cancer Maternal Aunt   . Breast cancer Maternal Grandmother   . Sleep apnea Father   . Hypertension Father   . Diabetes Sister   . Hypertension Sister   . Asthma Sister   . Cancer Paternal Grandfather     Social History Social History   Tobacco Use  . Smoking status: Never Smoker  . Smokeless tobacco: Never Used  Substance Use Topics  . Alcohol use: No  . Drug use: No     Allergies   Eucalyptus oil; Haldol [haloperidol decanoate]; Molds & smuts; Other; Penicillins; Bee venom; Corn-containing products; Demeclocycline; Dolobid [diflunisal]; Vicodin [hydrocodone-acetaminophen]; Doxycycline; Erythromycin; Keflex [cephalexin]; Macrodantin; Motrin [ibuprofen]; Septra [bactrim]; Tetracyclines & related; and Tofranil-pm   Review of Systems Review of Systems  Gastrointestinal: Positive for diarrhea.  Ten systems reviewed and are negative for acute change, except as noted in the HPI.    Physical Exam Updated Vital Signs BP (!) 118/98 (BP Location: Right Arm)   Pulse 74   Temp 98.1 F (36.7 C) (Oral)   Resp 20   Ht 5\' 9"  (1.753 m)   Wt 105.2 kg   LMP 07/30/2013   SpO2 100%   BMI 34.26 kg/m   Physical Exam Vitals signs and  nursing note reviewed.  Constitutional:      General: She is not in acute distress.    Appearance: She is well-developed. She is not diaphoretic.     Comments: Mildly hyperactive mood. Speech is occasionally slurred. Obese female.  HENT:     Head: Normocephalic and atraumatic.  Eyes:     General: No scleral icterus.    Conjunctiva/sclera: Conjunctivae normal.  Neck:     Musculoskeletal: Normal range of motion.  Cardiovascular:     Rate and Rhythm: Normal rate and regular rhythm.     Pulses: Normal pulses.  Pulmonary:     Effort: Pulmonary effort is normal. No respiratory distress.  Breath sounds: No stridor. No wheezing.     Comments: Respirations even and unlabored Musculoskeletal: Normal range of motion.  Skin:    General: Skin is warm and dry.     Coloration: Skin is not pale.     Findings: No erythema or rash.  Neurological:     Mental Status: She is alert and oriented to person, place, and time.     Coordination: Coordination normal.     Comments: GCS 15. Answers questions appropriately and follows commands. Tremors to bilateral hands. Normal reflexes in BLE. Moving all extremities spontaneously.  Psychiatric:        Behavior: Behavior normal.      ED Treatments / Results  Labs (all labs ordered are listed, but only abnormal results are displayed) Labs Reviewed  LIPASE, BLOOD - Abnormal; Notable for the following components:      Result Value   Lipase 92 (*)    All other components within normal limits  COMPREHENSIVE METABOLIC PANEL - Abnormal; Notable for the following components:   Potassium 3.2 (*)    Glucose, Bld 132 (*)    BUN 55 (*)    Creatinine, Ser 1.62 (*)    Calcium 10.8 (*)    AST 45 (*)    ALT 94 (*)    Alkaline Phosphatase 144 (*)    GFR calc non Af Amer 35 (*)    GFR calc Af Amer 40 (*)    All other components within normal limits  CBC - Abnormal; Notable for the following components:   WBC 11.5 (*)    Platelets 579 (*)    All other  components within normal limits  GASTROINTESTINAL PANEL BY PCR, STOOL (REPLACES STOOL CULTURE)  C DIFFICILE QUICK SCREEN W PCR REFLEX  URINALYSIS, ROUTINE W REFLEX MICROSCOPIC  LITHIUM LEVEL  MAGNESIUM  I-STAT BETA HCG BLOOD, ED ([facility], [facility], [facility] ONLY)    EKG EKG Interpretation  Date/Time:  Thursday May 20 2018 03:13:23 EST Ventricular Rate:  72 PR Interval:    QRS Duration: 100 QT Interval:  484 QTC Calculation: 530 R Axis:   6 Text Interpretation:  Sinus rhythm Low voltage, precordial leads Borderline T abnormalities, anterior leads Prolonged QT interval Baseline wander in lead(s) I II aVR V6 prolonged QT is new  Confirmed by Ezequiel Essex [phone]) on 05/20/2018 3:18:32 AM   Radiology No results found.  Procedures Procedures (including critical care time)  Medications Ordered in ED Medications  sodium chloride flush (NS) 0.9 % injection 3 mL (3 mLs Intravenous Not Given 05/20/18 0253)  potassium chloride 10 mEq in 100 mL IVPB (10 mEq Intravenous New Bag/Given 05/20/18 0344)  0.9 %  sodium chloride infusion (has no administration in time range)  promethazine (PHENERGAN) injection 12.5 mg (12.5 mg Intravenous Given 05/20/18 0341)  sodium chloride 0.9 % bolus 1,000 mL (1,000 mLs Intravenous New Bag/Given 05/20/18 0336)    CRITICAL CARE Performed by: Antonietta Breach   Total critical care time: 45 minutes  Critical care time was exclusive of separately billable procedures and treating other patients.  Critical care was necessary to treat or prevent imminent or life-threatening deterioration.  Critical care was time spent personally by me on the following activities: development of treatment plan with patient and/or surrogate as well as nursing, discussions with consultants, evaluation of patient's response to treatment, examination of patient, obtaining history from patient or surrogate, ordering and performing treatments and interventions, ordering and review of laboratory studies,  ordering and review of radiographic studies, pulse  oximetry and re-evaluation of patient's condition.   Initial Impression / Assessment and Plan / ED Course  I have reviewed the triage vital signs and the nursing notes.  Pertinent labs & imaging results that were available during my care of the patient were reviewed by me and considered in my medical decision making (see chart for details).        22:61 AM 58 year old female advised to come to the emergency department by her primary care doctor following outpatient lithium level of 1.9.  She has had persistent diarrhea x1.5 weeks with sporadic vomiting.  Complains of persistent nausea at this time.  No present concern for acute surgical abdomen.  Will discuss lithium level with poison control for recommendations.  3:30 AM - POISON CONTROL RECOMMENDATIONS Case discussed with poison control given lithium level of 1.9.  A subsequent lithium level was ordered for trending as well as a magnesium level.  Poison control recommends continued saline/IV fluids, EKG every 4 hours, trending of lithium until it starts to trend down.  Poison control also with recommendations to maximize potassium and magnesium; replete if needed.  They advise bicarb bolus if QRS widens to greater than 140.  Plan for admission. Paged out as unassigned.  3:45 AM Case discussed with Dr. Alcario Drought who will admit.   Final Clinical Impressions(s) / ED Diagnoses   Final diagnoses:  Vomiting and diarrhea  Lithium toxicity, accidental or unintentional, initial encounter  AKI (acute kidney injury) ([place])  Transaminitis  Hypokalemia  Prolonged QT interval    ED Discharge Orders    None       Antonietta Breach, PA-C 05/20/18 0345    Ezequiel Essex, MD 05/20/18 0730

## 2018-05-20 NOTE — Telephone Encounter (Signed)
Pt called to advise you she was admitted to hospital last night with dehydration and high lithium level.

## 2018-05-20 NOTE — Plan of Care (Signed)
  Problem: Education: Goal: Ability to demonstrate management of disease process will improve Outcome: Progressing   Problem: Education: Goal: Ability to verbalize understanding of medication therapies will improve Outcome: Progressing   Problem: Nutrition: Goal: Adequate nutrition will be maintained Outcome: Progressing

## 2018-05-20 NOTE — ED Notes (Signed)
ED TO INPATIENT HANDOFF REPORT  ED Nurse Name and Phone #: Herbie [place] [phone]  S Name/Age/Gender Leah Powers 58 y.o. female Room/Bed: 021C/021C  Code Status   Code Status: Full Code  Home/SNF/Other Home Patient oriented to: Person , Place , Time, Situation  Is this baseline? yes  Triage Complete: Triage complete  Chief Complaint Elevated Lithium Level per PCP , Diarrhea / Nausea  Triage Note Pt sent by PCP, c/o nausea and diarrhea x a few days. Also reports that her lithium level is elevated. Pt A&O x 4.   Allergies Allergies  Allergen Reactions  . Eucalyptus Oil Shortness Of Breath  . Haldol [Haloperidol Decanoate] Other (See Comments)    Drooling, weaving in floor, parkinson's syndrome (couldn't eat or talk) , pt was hospitalized   . Molds & Smuts Shortness Of Breath and Itching  . Other Anaphylaxis, Shortness Of Breath, Itching and Swelling    Bleu cheese   . Penicillins Anaphylaxis and Rash    Pt was 58 years old  Has patient had a PCN reaction causing immediate rash, facial/tongue/throat swelling, SOB or lightheadedness with hypotension: Yes Has patient had a PCN reaction causing severe rash involving mucus membranes or skin necrosis: Unknown Has patient had a PCN reaction that required hospitalization: No Has patient had a PCN reaction occurring within the last 10 years: No If all of the above answers are "NO", then may proceed with Cephalosporin use.   . Bee Venom Other (See Comments)    Yellow jackets and wasps-stung by 34, MD told pt that she wouldn't have resistance if stung again   . Corn-Containing Products Nausea And Vomiting and Other (See Comments)    Stomach distress   . Demeclocycline Other (See Comments)  . Dolobid [Diflunisal] Other (See Comments)    Upset stomach  . Vicodin [Hydrocodone-Acetaminophen] Diarrhea and Other (See Comments)    Upset stomach  . Doxycycline Rash  . Erythromycin Rash  . Keflex [Cephalexin] Rash  . Macrodantin Rash  .  Motrin [Ibuprofen] Palpitations  . Septra [Bactrim] Rash  . Tetracyclines & Related Rash  . Tofranil-Pm Rash    Level of Care/Admitting Diagnosis ED Disposition    ED Disposition Condition [facility] Area: [place] [[id number]]  Level of Care: Cardiac Telemetry [103]  I expect the patient will be discharged within 24 hours: Yes  LOW acuity---Tx typically complete <24 hrs---ACUTE conditions typically can be evaluated <24 hours---LABS likely to return to acceptable levels <24 hours---IS near functional baseline---EXPECTED to return to current living arrangement---NOT newly hypoxic: Meets criteria for 5C-Observation unit  Diagnosis: Lithium toxicity [177939]  Admitting Physician: Etta Quill [[id number]]  Attending Physician: Etta Quill [[id number]]  PT Class (Do Not Modify): Observation [104]  PT Acc Code (Do Not Modify): Observation [10022]       B Medical/Surgery History Past Medical History:  Diagnosis Date  . Arthritis    L hand, R ankle  (10/08/2016)  . Bipolar 1 disorder ([place])   . Diabetes mellitus without complication ([place])   . Fibromyalgia    told that the breakout on the upper back , could be over active nerves , also treated with cream & Gabapentin- [facility] [place]   . History of kidney stones    spontaneous passing  . Hypertension   . Hypothyroid   . Pneumonia ~ 1967; 07/2016  . Pyelonephritis 1997   treated with IV antibiotic    Past Surgical History:  Procedure Laterality Date  . BLADDER INSTILLATION  X 2  . CHALAZION EXCISION Bilateral    R- eye X2 , L eye- X1  . FOOT ARTHRODESIS Right 10/08/2016   Procedure: RIGHT TRIPLE ARTHRODESIS;  Surgeon: Newt Minion, MD;  Location: [facility];  Service: Orthopedics;  Laterality: Right;  . FOOT ARTHRODESIS, TRIPLE Right 10/08/2016   ankle  . FRENULECTOMY, LINGUAL  1970s  . LAPAROSCOPIC CHOLECYSTECTOMY  1996  . PALATE SURGERY  1970s   tissue removed from upper palate as a  child   . URETHRAL DILATION       A IV Location/Drains/Wounds Patient Lines/Drains/Airways Status   Active Line/Drains/Airways    Name:   Placement date:   Placement time:   Site:   Days:   Peripheral IV 05/20/18 Left Antecubital   05/20/18    0253    Antecubital   less than 1   Incision (Closed) 10/08/16 Ankle Right   10/08/16    0802     589          Intake/Output Last 24 hours No intake or output data in the 24 hours ending 05/20/18 0411  Labs/Imaging Results for orders placed or performed during the hospital encounter of 05/19/18 (from the past 48 hour(s))  Lipase, blood     Status: Abnormal   Collection Time: 05/19/18 11:07 PM  Result Value Ref Range   Lipase 92 (H) 11 - 51 U/L    Comment: Performed at [facility], [address], [place], [place] [address]  Comprehensive metabolic panel     Status: Abnormal   Collection Time: 05/19/18 11:07 PM  Result Value Ref Range   Sodium 140 135 - 145 mmol/L   Potassium 3.2 (L) 3.5 - 5.1 mmol/L   Chloride 110 98 - 111 mmol/L   CO2 22 22 - 32 mmol/L   Glucose, Bld 132 (H) 70 - 99 mg/dL   BUN 55 (H) 6 - 20 mg/dL   Creatinine, Ser 1.62 (H) 0.44 - 1.00 mg/dL   Calcium 10.8 (H) 8.9 - 10.3 mg/dL   Total Protein 7.0 6.5 - 8.1 g/dL   Albumin 4.4 3.5 - 5.0 g/dL   AST 45 (H) 15 - 41 U/L   ALT 94 (H) 0 - 44 U/L   Alkaline Phosphatase 144 (H) 38 - 126 U/L   Total Bilirubin 1.0 0.3 - 1.2 mg/dL   GFR calc non Af Amer 35 (L) >60 mL/min   GFR calc Af Amer 40 (L) >60 mL/min   Anion gap 8 5 - 15    Comment: Performed at [facility], [place] [address], [place], [place] [address]  CBC     Status: Abnormal   Collection Time: 05/19/18 11:07 PM  Result Value Ref Range   WBC 11.5 (H) 4.0 - 10.5 K/uL   RBC 4.88 3.87 - 5.11 MIL/uL   Hemoglobin 13.4 12.0 - 15.0 g/dL   HCT 43.8 36.0 - 46.0 %   MCV 89.8 80.0 - 100.0 fL   MCH 27.5 26.0 - 34.0 pg   MCHC 30.6 30.0 - 36.0 g/dL   RDW 13.2 11.5 - 15.5 %   Platelets 579 (H) 150 - 400 K/uL    nRBC 0.0 0.0 - 0.2 %    Comment: Performed at [facility], [place] [address] [place], [place] [address]  I-Stat beta hCG blood, ED     Status: None   Collection Time: 05/19/18 11:12 PM  Result Value Ref  Range   I-stat hCG, quantitative <5.0 <5 mIU/mL   Comment 3            Comment:   GEST. AGE      CONC.  (mIU/mL)   <=1 WEEK        5 - 50     2 WEEKS       50 - 500     3 WEEKS       100 - 10,000     4 WEEKS     1,000 - 30,000        FEMALE AND NON-PREGNANT FEMALE:     LESS THAN 5 mIU/mL   CBG monitoring, ED     Status: Abnormal   Collection Time: 05/20/18  4:09 AM  Result Value Ref Range   Glucose-Capillary 111 (H) 70 - 99 mg/dL   Comment 1 Notify RN    Comment 2 Document in Chart    No results found.  Pending Labs Unresulted Labs (From admission, onward)    Start     Ordered   05/20/18 0353  HIV antibody (Routine Testing)  Once,   R     05/20/18 0354   05/20/18 0330  Magnesium  ONCE - STAT,   STAT     05/20/18 0330   05/20/18 0328  Lithium level  ONCE - STAT,   STAT     05/20/18 0327   05/20/18 0319  Gastrointestinal Panel by PCR , Stool  (Gastrointestinal Panel by PCR, Stool)  Once,   R     05/20/18 0318   05/20/18 0319  C difficile quick scan w PCR reflex  (C Difficile quick screen w PCR reflex panel)  Once, for 24 hours,   R     05/20/18 0318   05/19/18 2305  Urinalysis, Routine w reflex microscopic  ONCE - STAT,   STAT     05/19/18 2304          Vitals/Pain Today's Vitals   05/19/18 2300 05/19/18 2304 05/20/18 0251 05/20/18 0255  BP: (!) 141/72  (!) 118/98   Pulse: 84  74   Resp: 20  20   Temp: 98.1 F (36.7 C)     TempSrc: Oral     SpO2: 96%  100%   Weight:   105.2 kg   Height:   5\' 9"  (1.753 m)   PainSc:  0-No pain  0-No pain    Isolation Precautions Enteric precautions (UV disinfection)  Medications Medications  sodium chloride flush (NS) 0.9 % injection 3 mL (3 mLs Intravenous Not Given 05/20/18 0253)  potassium chloride 10 mEq in 100 mL  IVPB (10 mEq Intravenous New Bag/Given 05/20/18 0344)  0.9 %  sodium chloride infusion ( Intravenous New Bag/Given 05/20/18 0355)  ondansetron (ZOFRAN) injection 4 mg (has no administration in time range)  insulin aspart (novoLOG) injection 0-9 Units (has no administration in time range)  acetaminophen (TYLENOL) tablet 650 mg (has no administration in time range)    Or  acetaminophen (TYLENOL) suppository 650 mg (has no administration in time range)  enoxaparin (LOVENOX) injection 40 mg (has no administration in time range)  promethazine (PHENERGAN) injection 12.5 mg (12.5 mg Intravenous Given 05/20/18 0341)  sodium chloride 0.9 % bolus 1,000 mL (1,000 mLs Intravenous New Bag/Given 05/20/18 0336)    Mobility walks Low fall risk   Focused Assessments Spouse at bedside .    R Recommendations: See Admitting Provider Note  Report given to:   Additional Notes:  Intermittent LUQ pain radiating to left upper back.

## 2018-05-21 DIAGNOSIS — N179 Acute kidney failure, unspecified: Secondary | ICD-10-CM | POA: Diagnosis present

## 2018-05-21 DIAGNOSIS — E039 Hypothyroidism, unspecified: Secondary | ICD-10-CM | POA: Diagnosis not present

## 2018-05-21 DIAGNOSIS — Z79890 Hormone replacement therapy (postmenopausal): Secondary | ICD-10-CM | POA: Diagnosis not present

## 2018-05-21 DIAGNOSIS — E876 Hypokalemia: Secondary | ICD-10-CM | POA: Diagnosis present

## 2018-05-21 DIAGNOSIS — R7989 Other specified abnormal findings of blood chemistry: Secondary | ICD-10-CM | POA: Diagnosis present

## 2018-05-21 DIAGNOSIS — Z803 Family history of malignant neoplasm of breast: Secondary | ICD-10-CM | POA: Diagnosis not present

## 2018-05-21 DIAGNOSIS — M797 Fibromyalgia: Secondary | ICD-10-CM | POA: Diagnosis not present

## 2018-05-21 DIAGNOSIS — Z7984 Long term (current) use of oral hypoglycemic drugs: Secondary | ICD-10-CM | POA: Diagnosis not present

## 2018-05-21 DIAGNOSIS — Z833 Family history of diabetes mellitus: Secondary | ICD-10-CM | POA: Diagnosis not present

## 2018-05-21 DIAGNOSIS — I10 Essential (primary) hypertension: Secondary | ICD-10-CM | POA: Diagnosis present

## 2018-05-21 DIAGNOSIS — Z888 Allergy status to other drugs, medicaments and biological substances status: Secondary | ICD-10-CM | POA: Diagnosis not present

## 2018-05-21 DIAGNOSIS — E119 Type 2 diabetes mellitus without complications: Secondary | ICD-10-CM | POA: Diagnosis present

## 2018-05-21 DIAGNOSIS — Z981 Arthrodesis status: Secondary | ICD-10-CM | POA: Diagnosis not present

## 2018-05-21 DIAGNOSIS — Z880 Allergy status to penicillin: Secondary | ICD-10-CM | POA: Diagnosis not present

## 2018-05-21 DIAGNOSIS — Z8249 Family history of ischemic heart disease and other diseases of the circulatory system: Secondary | ICD-10-CM | POA: Diagnosis not present

## 2018-05-21 DIAGNOSIS — Z91030 Bee allergy status: Secondary | ICD-10-CM | POA: Diagnosis not present

## 2018-05-21 DIAGNOSIS — Z885 Allergy status to narcotic agent status: Secondary | ICD-10-CM | POA: Diagnosis not present

## 2018-05-21 DIAGNOSIS — Z881 Allergy status to other antibiotic agents status: Secondary | ICD-10-CM | POA: Diagnosis not present

## 2018-05-21 DIAGNOSIS — T43595A Adverse effect of other antipsychotics and neuroleptics, initial encounter: Secondary | ICD-10-CM | POA: Diagnosis not present

## 2018-05-21 DIAGNOSIS — Z825 Family history of asthma and other chronic lower respiratory diseases: Secondary | ICD-10-CM | POA: Diagnosis not present

## 2018-05-21 DIAGNOSIS — R112 Nausea with vomiting, unspecified: Secondary | ICD-10-CM | POA: Diagnosis not present

## 2018-05-21 DIAGNOSIS — Z886 Allergy status to analgesic agent status: Secondary | ICD-10-CM | POA: Diagnosis not present

## 2018-05-21 DIAGNOSIS — E860 Dehydration: Secondary | ICD-10-CM | POA: Diagnosis not present

## 2018-05-21 DIAGNOSIS — F319 Bipolar disorder, unspecified: Secondary | ICD-10-CM | POA: Diagnosis present

## 2018-05-21 LAB — GLUCOSE, CAPILLARY
Glucose-Capillary: 102 mg/dL — ABNORMAL HIGH (ref 70–99)
Glucose-Capillary: 115 mg/dL — ABNORMAL HIGH (ref 70–99)
Glucose-Capillary: 117 mg/dL — ABNORMAL HIGH (ref 70–99)
Glucose-Capillary: 126 mg/dL — ABNORMAL HIGH (ref 70–99)
Glucose-Capillary: 138 mg/dL — ABNORMAL HIGH (ref 70–99)
Glucose-Capillary: 146 mg/dL — ABNORMAL HIGH (ref 70–99)

## 2018-05-21 LAB — COMPREHENSIVE METABOLIC PANEL
ALT: 67 U/L — ABNORMAL HIGH (ref 0–44)
AST: 31 U/L (ref 15–41)
Albumin: 3.3 g/dL — ABNORMAL LOW (ref 3.5–5.0)
Alkaline Phosphatase: 104 U/L (ref 38–126)
Anion gap: 3 — ABNORMAL LOW (ref 5–15)
BUN: 28 mg/dL — ABNORMAL HIGH (ref 6–20)
CO2: 20 mmol/L — ABNORMAL LOW (ref 22–32)
Calcium: 9.5 mg/dL (ref 8.9–10.3)
Chloride: 117 mmol/L — ABNORMAL HIGH (ref 98–111)
Creatinine, Ser: 1.09 mg/dL — ABNORMAL HIGH (ref 0.44–1.00)
GFR calc Af Amer: >60 mL/min (ref >60)
GFR calc non Af Amer: 56 mL/min — ABNORMAL LOW (ref >60)
Glucose, Bld: 140 mg/dL — ABNORMAL HIGH (ref 70–99)
Potassium: 3.9 mmol/L (ref 3.5–5.1)
Sodium: 140 mmol/L (ref 135–145)
Total Bilirubin: 0.4 mg/dL (ref 0.3–1.2)
Total Protein: 5.5 g/dL — ABNORMAL LOW (ref 6.5–8.1)

## 2018-05-21 LAB — LITHIUM LEVEL
Lithium Lvl: 1.21 mmol/L — ABNORMAL HIGH (ref 0.60–1.20)
Lithium Lvl: 1 mmol/L (ref 0.60–1.20)

## 2018-05-21 MED ORDER — LITHIUM CARBONATE 300 MG PO CAPS
900.0000 mg | ORAL_CAPSULE | Freq: Every day | ORAL | Status: DC
  Filled 2018-05-21: qty 3

## 2018-05-21 NOTE — Progress Notes (Signed)
PROGRESS NOTE    Leah Powers   JXB:[id number]  DOB: 1960/09/05  DOA: 05/19/2018 PCP: [facility]   Brief Narrative:  Leah Powers is a 58 year old female with diabetes mellitus type 2, hypertension, hypothyroidism who presents with nausea vomiting diarrhea started a few weeks ago but got worse after she took a 5-day course of Levaquin and influenza.  She states the last episode of diarrhea was a couple of days before coming into the hospital however she continues to have vomiting and abdominal pain and feels dehydrated. In addition to above her lithium level was noted to be elevated by her PCP and she was asked to come to the hospital for this.   Subjective: No further vomiting but is nauseated. Still mostly taking liquid diet. Beginning to urinate more. Had one soft stool today. Feels very weak.     Assessment & Plan:   Principal Problem:   Nausea vomiting and diarrhea causing AKI (acute kidney injury)   - diarrhea resolved but she continues to vomit and have abdominal pain - start Zofran QID for nausea, vomiting- she can have soft foods as she is hungry- she knows to stop if she has more vomiting - AKI improving but not resolved - baseline Cr ~ 0.75 -cont IVF and continue to try to advance diet- she is quite weak and needs to start ambulating today   Active Problems: Thrombocytosis -  follow  Hypokalemia - replacee    Elevated Lithium level- Bipolar I disorder   - level has normalized- will resume Lithium tomorrow    HTN (hypertension) - hold HCTZ ad Benazepril- BP still low    Hypothyroidism - cont Synthroid   Time spent in minutes: 35 DVT prophylaxis: Lovenox Code Status: Full code Family Communication: no family at bedside Disposition Plan: home when able to tolerate diet Consultants:   none Procedures:   none Antimicrobials:  Anti-infectives (From admission, onward)   None       Objective: Vitals:   05/20/18 1957  05/20/18 2350 05/21/18 0536 05/21/18 1141  BP: 129/66 135/69 124/72 118/69  Pulse: 66 66 66 63  Resp: 18 20 18  (!) 22  Temp: 99.1 F (37.3 C) 98.9 F (37.2 C) 97.8 F (36.6 C) 98.5 F (36.9 C)  TempSrc: Oral Oral Oral Oral  SpO2: 100% 100% 98% 98%  Weight:   108.3 kg   Height:        Intake/Output Summary (Last 24 hours) at 05/21/2018 1301 Last data filed at 05/21/2018 1002 Gross per 24 hour  Intake 3398.86 ml  Output 2126 ml  Net 1272.86 ml   Filed Weights   05/20/18 0251 05/20/18 0445 05/21/18 0536  Weight: 105.2 kg 106.1 kg 108.3 kg    Examination: General exam: Appears comfortable  HEENT: PERRLA, oral mucosa moist, no sclera icterus or thrush Respiratory system: Clear to auscultation. Respiratory effort normal. Cardiovascular system: S1 & S2 heard, RRR.   Gastrointestinal system: Abdomen soft, mid abdominal pain, nondistended. Normal bowel sounds. Central nervous system: Alert and oriented. No focal neurological deficits. Extremities: No cyanosis, clubbing or edema Skin: No rashes or ulcers Psychiatry:  Mood & affect appropriate.     Data Reviewed: I have personally reviewed following labs and imaging studies  CBC: Recent Labs  Lab 05/19/18 2307 05/20/18 0748  WBC 11.5* 11.3*  HGB 13.4 12.6  HCT 43.8 40.5  MCV 89.8 88.8  PLT 579* 130*   Basic Metabolic Panel: Recent Labs  Lab 05/19/18 2307 05/20/18  4132 05/20/18 0748 05/21/18 0310  NA 140  --  139 140  K 3.2*  --  3.4* 3.9  CL 110  --  108 117*  CO2 22  --  20* 20*  GLUCOSE 132*  --  131* 140*  BUN 55*  --  46* 28*  CREATININE 1.62*  --  1.38* 1.09*  CALCIUM 10.8*  --  10.0 9.5  MG  --  2.8*  --   --    GFR: Estimated Creatinine Clearance: 74.6 mL/min (A) (by C-G formula based on SCr of 1.09 mg/dL (H)). Liver Function Tests: Recent Labs  Lab 05/19/18 2307 05/21/18 0310  AST 45* 31  ALT 94* 67*  ALKPHOS 144* 104  BILITOT 1.0 0.4  PROT 7.0 5.5*  ALBUMIN 4.4 3.3*   Recent Labs  Lab  05/19/18 2307  LIPASE 92*   No results for input(s): AMMONIA in the last 168 hours. Coagulation Profile: No results for input(s): INR, PROTIME in the last 168 hours. Cardiac Enzymes: No results for input(s): CKTOTAL, CKMB, CKMBINDEX, TROPONINI in the last 168 hours. BNP (last 3 results) No results for input(s): PROBNP in the last 8760 hours. HbA1C: No results for input(s): HGBA1C in the last 72 hours. CBG: Recent Labs  Lab 05/20/18 1607 05/20/18 2006 05/21/18 0034 05/21/18 0446 05/21/18 1136  GLUCAP 147* 105* 117* 115* 102*   Lipid Profile: No results for input(s): CHOL, HDL, LDLCALC, TRIG, CHOLHDL, LDLDIRECT in the last 72 hours. Thyroid Function Tests: No results for input(s): TSH, T4TOTAL, FREET4, T3FREE, THYROIDAB in the last 72 hours. Anemia Panel: No results for input(s): VITAMINB12, FOLATE, FERRITIN, TIBC, IRON, RETICCTPCT in the last 72 hours. Urine analysis:    Component Value Date/Time   COLORURINE YELLOW 05/20/2018 0512   APPEARANCEUR HAZY (A) 05/20/2018 0512   LABSPEC 1.016 05/20/2018 0512   PHURINE 6.0 05/20/2018 0512   GLUCOSEU NEGATIVE 05/20/2018 0512   HGBUR NEGATIVE 05/20/2018 0512   BILIRUBINUR NEGATIVE 05/20/2018 0512   KETONESUR NEGATIVE 05/20/2018 0512   PROTEINUR NEGATIVE 05/20/2018 0512   UROBILINOGEN 1.0 06/12/2011 1404   NITRITE NEGATIVE 05/20/2018 0512   LEUKOCYTESUR SMALL (A) 05/20/2018 0512   Sepsis Labs: @LABRCNTIP (procalcitonin:4,lacticidven:4) ) Recent Results (from the past 240 hour(s))  Gastrointestinal Panel by PCR , Stool     Status: None   Collection Time: 05/20/18  3:19 AM  Result Value Ref Range Status   Campylobacter species NOT DETECTED NOT DETECTED Final   Plesimonas shigelloides NOT DETECTED NOT DETECTED Final   Salmonella species NOT DETECTED NOT DETECTED Final   Yersinia enterocolitica NOT DETECTED NOT DETECTED Final   Vibrio species NOT DETECTED NOT DETECTED Final   Vibrio cholerae NOT DETECTED NOT DETECTED Final    Enteroaggregative E coli (EAEC) NOT DETECTED NOT DETECTED Final   Enteropathogenic E coli (EPEC) NOT DETECTED NOT DETECTED Final   Enterotoxigenic E coli (ETEC) NOT DETECTED NOT DETECTED Final   Shiga like toxin producing E coli (STEC) NOT DETECTED NOT DETECTED Final   Shigella/Enteroinvasive E coli (EIEC) NOT DETECTED NOT DETECTED Final   Cryptosporidium NOT DETECTED NOT DETECTED Final   Cyclospora cayetanensis NOT DETECTED NOT DETECTED Final   Entamoeba histolytica NOT DETECTED NOT DETECTED Final   Giardia lamblia NOT DETECTED NOT DETECTED Final   Adenovirus F40/41 NOT DETECTED NOT DETECTED Final   Astrovirus NOT DETECTED NOT DETECTED Final   Norovirus GI/GII NOT DETECTED NOT DETECTED Final   Rotavirus A NOT DETECTED NOT DETECTED Final   Sapovirus (I, II, IV, and V)  NOT DETECTED NOT DETECTED Final    Comment: Performed at [facility], [address] [place], [place] [address]  C difficile quick scan w PCR reflex     Status: None   Collection Time: 05/20/18  3:19 AM  Result Value Ref Range Status   C Diff antigen NEGATIVE NEGATIVE Final   C Diff toxin NEGATIVE NEGATIVE Final   C Diff interpretation No C. difficile detected.  Final    Comment: Performed at [facility], [place] [address] [place], [place] [address]  MRSA PCR Screening     Status: None   Collection Time: 05/20/18 12:31 PM  Result Value Ref Range Status   MRSA by PCR NEGATIVE NEGATIVE Final    Comment:        The GeneXpert MRSA Assay (FDA approved for NASAL specimens only), is one component of a comprehensive MRSA colonization surveillance program. It is not intended to diagnose MRSA infection nor to guide or monitor treatment for MRSA infections. Performed at [facility], [place] [address], [place], [place] [address]          Radiology Studies: No results found.    Scheduled Meds: . enoxaparin (LOVENOX) injection  40 mg Subcutaneous Daily  . feeding supplement  1 Container Oral TID  BM  . ondansetron (ZOFRAN) IV  4 mg Intravenous Q6H  . sodium chloride flush  3 mL Intravenous Once   Continuous Infusions: . dextrose 5 % and 0.9 % NaCl with KCl 40 mEq/L 125 mL/hr at 05/21/18 0824     LOS: 0 days      Debbe Odea, MD [facility] Pager: [email or web address] Password [email or web address] 05/21/2018, 1:01 PM

## 2018-05-22 DIAGNOSIS — E876 Hypokalemia: Secondary | ICD-10-CM

## 2018-05-22 DIAGNOSIS — R740 Nonspecific elevation of levels of transaminase and lactic acid dehydrogenase [LDH]: Secondary | ICD-10-CM

## 2018-05-22 LAB — GLUCOSE, CAPILLARY
Glucose-Capillary: 158 mg/dL — ABNORMAL HIGH (ref 70–99)
Glucose-Capillary: 132 mg/dL — ABNORMAL HIGH (ref 70–99)
Glucose-Capillary: 109 mg/dL — ABNORMAL HIGH (ref 70–99)

## 2018-05-22 MED ORDER — LITHIUM CARBONATE 300 MG PO CAPS
900.0000 mg | ORAL_CAPSULE | Freq: Every day | ORAL | Status: DC
  Administered 2018-05-22: 900 mg via ORAL
  Filled 2018-05-22: qty 3

## 2018-05-22 NOTE — Discharge Instructions (Signed)
Avoid heave exertion. Build up your strength slowly over the next few days. Do not take HCTZ if you are not able to drink fluids.   You were cared for by a hospitalist during your hospital stay. If you have any questions about your discharge medications or the care you received while you were in the hospital after you are discharged, you can call the unit and asked to speak with the hospitalist on call if the hospitalist that took care of you is not available. Once you are discharged, your primary care physician will handle any further medical issues.   Please note that NO REFILLS for any discharge medications will be authorized once you are discharged, as it is imperative that you return to your primary care physician (or establish a relationship with a primary care physician if you do not have one) for your aftercare needs so that they can reassess your need for medications and monitor your lab values.  Please take all your medications with you for your next visit with your Primary MD. Please ask your Primary MD to get all Hospital records sent to his/her office. Please request your Primary MD to go over all hospital test results at the follow up.   If you experience worsening of your admission symptoms, develop shortness of breath, chest pain, suicidal or homicidal thoughts or a life threatening emergency, you must seek medical attention immediately by calling 911 or calling your MD.   Dennis Bast must read the complete instructions/literature along with all the possible adverse reactions/side effects for all the medicines you take including new medications that have been prescribed to you. Take new medicines after you have completely understood and accpet all the possible adverse reactions/side effects.    Do not drive when taking pain medications or sedatives.     Do not take more than prescribed Pain, Sleep and Anxiety Medications   If you have smoked or chewed Tobacco in the last 2 yrs please stop.  Stop any regular alcohol  and or recreational drug use.   Wear Seat belts while driving.

## 2018-05-22 NOTE — Progress Notes (Signed)
Patient will discharge from unit today to home, Husband present to provide transportation.  All discharge instructions reviewed with patient. Patient demonstrates no s/sx of distress. All meds and follow up appts reviewed with pt. All personal belongings with patient.

## 2018-05-22 NOTE — Discharge Summary (Signed)
Physician Discharge Summary  Leah Powers NGE:[id number] DOB: 17-May-1960 DOA: 05/19/2018  PCP: [facility]  Admit date: 05/19/2018 Discharge date: 05/22/2018  Admitted From: Home Disposition: Home  Recommendations for Outpatient Follow-up:  1. Follow-up blood pressure next week  Discharge Condition: Stable CODE STATUS: Full code Diet recommendation: Diabetic, heart healthy Consultations:  None   Discharge Diagnoses:  Principal Problem:   Nausea vomiting and diarrhea Active Problems:   HTN (hypertension)   Hypothyroidism   Bipolar I disorder (HCC)   AKI (acute kidney injury) ([place])   Lithium toxicity   DM2 (diabetes mellitus, type 2) ([place])      Brief Summary: Leah Powers is a 58 year old female with diabetes mellitus type 2, hypertension, hypothyroidism who presents with nausea vomiting diarrhea started a few weeks ago but got worse after she took a 5-day course of Levaquin and influenza.  She states the last episode of diarrhea was a couple of days before coming into the hospital however she continues to have vomiting and abdominal pain and feels dehydrated. In addition to above her lithium level was noted to be elevated by her PCP and she was asked to come to the hospital for this.  Hospital Course:  Principal Problem:   Nausea vomiting and diarrhea causing AKI (acute kidney injury)   - diarrhea resolved but she continued to vomit and have abdominal pain in the hospital - started Zofran QID for nausea, vomiting-continued on aggressive IV fluids  - baseline Cr ~ 0.75 -She has improved, is tolerating solid food, is able to ambulate down the hall and is urinating well   Active Problems: Thrombocytosis -  Likely due to acute infection and concentration related to dehydration-is improving  Hypokalemia - replacing    Elevated Lithium level- Bipolar I disorder   -Lithium likely accumulated in the body secondary to acute renal failure -  level has normalized to 1.0-I have resumed lithium     HTN (hypertension) - holding HCTZ ad Benazepril the hospital due to dehydration and AKI -BP has improved and both meds can be resumed tomorrow-I have told her that if she is not able to drink enough water, she should continue to hold the hydrochlorothiazide    Hypothyroidism - cont Synthroid  Discharge Exam: Vitals:   05/22/18 0330 05/22/18 0750  BP: 109/61 131/62  Pulse: 67 62  Resp: 20 20  Temp: 98 F (36.7 C) 97.7 F (36.5 C)  SpO2:     Vitals:   05/21/18 1141 05/21/18 1941 05/22/18 0330 05/22/18 0750  BP: 118/69 (!) 144/70 109/61 131/62  Pulse: 63 62 67 62  Resp: (!) 22 18 20 20   Temp: 98.5 F (36.9 C) 99.4 F (37.4 C) 98 F (36.7 C) 97.7 F (36.5 C)  TempSrc: Oral Oral Oral Oral  SpO2: 98% 98%    Weight:   110.9 kg   Height:        General: Pt is alert, awake, not in acute distress Cardiovascular: RRR, S1/S2 +, no rubs, no gallops Respiratory: CTA bilaterally, no wheezing, no rhonchi Abdominal: Soft, NT, ND, bowel sounds + Extremities: no edema, no cyanosis   Discharge Instructions  Discharge Instructions    Diet - low sodium heart healthy   Complete by:  As directed    Increase activity slowly   Complete by:  As directed      Allergies as of 05/22/2018      Reactions   Eucalyptus Oil Shortness Of Breath   Haldol [haloperidol Decanoate]  Other (See Comments)   Drooling, weaving in floor, parkinson's syndrome (couldn't eat or talk) , pt was hospitalized    Molds & Smuts Shortness Of Breath, Itching   Other Anaphylaxis, Shortness Of Breath, Itching, Swelling   Bleu cheese    Penicillins Anaphylaxis, Rash   Pt was 58 years old  Has patient had a PCN reaction causing immediate rash, facial/tongue/throat swelling, SOB or lightheadedness with hypotension: Yes Has patient had a PCN reaction causing severe rash involving mucus membranes or skin necrosis: Unknown Has patient had a PCN reaction that  required hospitalization: No Has patient had a PCN reaction occurring within the last 10 years: No If all of the above answers are "NO", then may proceed with Cephalosporin use.   Bee Venom Other (See Comments)   Yellow jackets and wasps-stung by 44, MD told pt that she wouldn't have resistance if stung again    Corn-containing Products Nausea And Vomiting, Other (See Comments)   Stomach distress    Demeclocycline Other (See Comments)   Dolobid [diflunisal] Other (See Comments)   Upset stomach   Vicodin [hydrocodone-acetaminophen] Diarrhea, Other (See Comments)   Upset stomach   Doxycycline Rash   Erythromycin Rash   Keflex [cephalexin] Rash   Macrodantin Rash   Motrin [ibuprofen] Palpitations   Septra [bactrim] Rash   Tetracyclines & Related Rash   Tofranil-pm Rash      Medication List    TAKE these medications   benazepril 10 MG tablet Commonly known as:  LOTENSIN TAKE ONE TABLET BY MOUTH ONCE DAILY   cetirizine 5 MG tablet Commonly known as:  ZYRTEC Take 5 mg by mouth 2 (two) times daily as needed for allergies.   hydrochlorothiazide 25 MG tablet Commonly known as:  HYDRODIURIL TAKE ONE TABLET BY MOUTH ONCE DAILY   levothyroxine 150 MCG tablet Commonly known as:  Synthroid Take 1 tablet (150 mcg total) by mouth daily before breakfast.   lithium carbonate 300 MG capsule TAKE 3 CAPSULES BY MOUTH ONCE DAILY   mupirocin ointment 2 % Commonly known as:  BACTROBAN Apply 1 application topically 2 (two) times daily.       Allergies  Allergen Reactions  . Eucalyptus Oil Shortness Of Breath  . Haldol [Haloperidol Decanoate] Other (See Comments)    Drooling, weaving in floor, parkinson's syndrome (couldn't eat or talk) , pt was hospitalized   . Molds & Smuts Shortness Of Breath and Itching  . Other Anaphylaxis, Shortness Of Breath, Itching and Swelling    Bleu cheese   . Penicillins Anaphylaxis and Rash    Pt was 58 years old  Has patient had a PCN reaction causing  immediate rash, facial/tongue/throat swelling, SOB or lightheadedness with hypotension: Yes Has patient had a PCN reaction causing severe rash involving mucus membranes or skin necrosis: Unknown Has patient had a PCN reaction that required hospitalization: No Has patient had a PCN reaction occurring within the last 10 years: No If all of the above answers are "NO", then may proceed with Cephalosporin use.   . Bee Venom Other (See Comments)    Yellow jackets and wasps-stung by 52, MD told pt that she wouldn't have resistance if stung again   . Corn-Containing Products Nausea And Vomiting and Other (See Comments)    Stomach distress   . Demeclocycline Other (See Comments)  . Dolobid [Diflunisal] Other (See Comments)    Upset stomach  . Vicodin [Hydrocodone-Acetaminophen] Diarrhea and Other (See Comments)    Upset stomach  . Doxycycline Rash  .  Erythromycin Rash  . Keflex [Cephalexin] Rash  . Macrodantin Rash  . Motrin [Ibuprofen] Palpitations  . Septra [Bactrim] Rash  . Tetracyclines & Related Rash  . Tofranil-Pm Rash     Procedures/Studies:     No results found.   The results of significant diagnostics from this hospitalization (including imaging, microbiology, ancillary and laboratory) are listed below for reference.     Microbiology: Recent Results (from the past 240 hour(s))  Gastrointestinal Panel by PCR , Stool     Status: None   Collection Time: 05/20/18  3:19 AM  Result Value Ref Range Status   Campylobacter species NOT DETECTED NOT DETECTED Final   Plesimonas shigelloides NOT DETECTED NOT DETECTED Final   Salmonella species NOT DETECTED NOT DETECTED Final   Yersinia enterocolitica NOT DETECTED NOT DETECTED Final   Vibrio species NOT DETECTED NOT DETECTED Final   Vibrio cholerae NOT DETECTED NOT DETECTED Final   Enteroaggregative E coli (EAEC) NOT DETECTED NOT DETECTED Final   Enteropathogenic E coli (EPEC) NOT DETECTED NOT DETECTED Final   Enterotoxigenic E coli  (ETEC) NOT DETECTED NOT DETECTED Final   Shiga like toxin producing E coli (STEC) NOT DETECTED NOT DETECTED Final   Shigella/Enteroinvasive E coli (EIEC) NOT DETECTED NOT DETECTED Final   Cryptosporidium NOT DETECTED NOT DETECTED Final   Cyclospora cayetanensis NOT DETECTED NOT DETECTED Final   Entamoeba histolytica NOT DETECTED NOT DETECTED Final   Giardia lamblia NOT DETECTED NOT DETECTED Final   Adenovirus F40/41 NOT DETECTED NOT DETECTED Final   Astrovirus NOT DETECTED NOT DETECTED Final   Norovirus GI/GII NOT DETECTED NOT DETECTED Final   Rotavirus A NOT DETECTED NOT DETECTED Final   Sapovirus (I, II, IV, and V) NOT DETECTED NOT DETECTED Final    Comment: Performed at [facility], [address] [place], [place] [address]  C difficile quick scan w PCR reflex     Status: None   Collection Time: 05/20/18  3:19 AM  Result Value Ref Range Status   C Diff antigen NEGATIVE NEGATIVE Final   C Diff toxin NEGATIVE NEGATIVE Final   C Diff interpretation No C. difficile detected.  Final    Comment: Performed at [facility], [place] [address], [place], [place] [address]  MRSA PCR Screening     Status: None   Collection Time: 05/20/18 12:31 PM  Result Value Ref Range Status   MRSA by PCR NEGATIVE NEGATIVE Final    Comment:        The GeneXpert MRSA Assay (FDA approved for NASAL specimens only), is one component of a comprehensive MRSA colonization surveillance program. It is not intended to diagnose MRSA infection nor to guide or monitor treatment for MRSA infections. Performed at [facility], [place] [address], [place], [place] [address]      Labs: BNP (last 3 results) No results for input(s): BNP in the last 8760 hours. Basic Metabolic Panel: Recent Labs  Lab 05/19/18 2307 05/20/18 0339 05/20/18 0748 05/21/18 0310  NA 140  --  139 140  K 3.2*  --  3.4* 3.9  CL 110  --  108 117*  CO2 22  --  20* 20*  GLUCOSE 132*  --  131* 140*  BUN 55*  --  46* 28*   CREATININE 1.62*  --  1.38* 1.09*  CALCIUM 10.8*  --  10.0 9.5  MG  --  2.8*  --   --    Liver Function Tests: Recent Labs  Lab  05/19/18 2307 05/21/18 0310  AST 45* 31  ALT 94* 67*  ALKPHOS 144* 104  BILITOT 1.0 0.4  PROT 7.0 5.5*  ALBUMIN 4.4 3.3*   Recent Labs  Lab 05/19/18 2307  LIPASE 92*   No results for input(s): AMMONIA in the last 168 hours. CBC: Recent Labs  Lab 05/19/18 2307 05/20/18 0748  WBC 11.5* 11.3*  HGB 13.4 12.6  HCT 43.8 40.5  MCV 89.8 88.8  PLT 579* 519*   Cardiac Enzymes: No results for input(s): CKTOTAL, CKMB, CKMBINDEX, TROPONINI in the last 168 hours. BNP: Invalid input(s): POCBNP CBG: Recent Labs  Lab 05/21/18 1641 05/21/18 2029 05/21/18 2342 05/22/18 0501 05/22/18 0709  GLUCAP 138* 146* 126* 158* 132*   D-Dimer No results for input(s): DDIMER in the last 72 hours. Hgb A1c No results for input(s): HGBA1C in the last 72 hours. Lipid Profile No results for input(s): CHOL, HDL, LDLCALC, TRIG, CHOLHDL, LDLDIRECT in the last 72 hours. Thyroid function studies No results for input(s): TSH, T4TOTAL, T3FREE, THYROIDAB in the last 72 hours.  Invalid input(s): FREET3 Anemia work up No results for input(s): VITAMINB12, FOLATE, FERRITIN, TIBC, IRON, RETICCTPCT in the last 72 hours. Urinalysis    Component Value Date/Time   COLORURINE YELLOW 05/20/2018 0512   APPEARANCEUR HAZY (A) 05/20/2018 0512   LABSPEC 1.016 05/20/2018 0512   PHURINE 6.0 05/20/2018 0512   GLUCOSEU NEGATIVE 05/20/2018 0512   HGBUR NEGATIVE 05/20/2018 0512   BILIRUBINUR NEGATIVE 05/20/2018 0512   KETONESUR NEGATIVE 05/20/2018 0512   PROTEINUR NEGATIVE 05/20/2018 0512   UROBILINOGEN 1.0 06/12/2011 1404   NITRITE NEGATIVE 05/20/2018 0512   LEUKOCYTESUR SMALL (A) 05/20/2018 0512   Sepsis Labs Invalid input(s): PROCALCITONIN,  WBC,  LACTICIDVEN Microbiology Recent Results (from the past 240 hour(s))  Gastrointestinal Panel by PCR , Stool     Status: None    Collection Time: 05/20/18  3:19 AM  Result Value Ref Range Status   Campylobacter species NOT DETECTED NOT DETECTED Final   Plesimonas shigelloides NOT DETECTED NOT DETECTED Final   Salmonella species NOT DETECTED NOT DETECTED Final   Yersinia enterocolitica NOT DETECTED NOT DETECTED Final   Vibrio species NOT DETECTED NOT DETECTED Final   Vibrio cholerae NOT DETECTED NOT DETECTED Final   Enteroaggregative E coli (EAEC) NOT DETECTED NOT DETECTED Final   Enteropathogenic E coli (EPEC) NOT DETECTED NOT DETECTED Final   Enterotoxigenic E coli (ETEC) NOT DETECTED NOT DETECTED Final   Shiga like toxin producing E coli (STEC) NOT DETECTED NOT DETECTED Final   Shigella/Enteroinvasive E coli (EIEC) NOT DETECTED NOT DETECTED Final   Cryptosporidium NOT DETECTED NOT DETECTED Final   Cyclospora cayetanensis NOT DETECTED NOT DETECTED Final   Entamoeba histolytica NOT DETECTED NOT DETECTED Final   Giardia lamblia NOT DETECTED NOT DETECTED Final   Adenovirus F40/41 NOT DETECTED NOT DETECTED Final   Astrovirus NOT DETECTED NOT DETECTED Final   Norovirus GI/GII NOT DETECTED NOT DETECTED Final   Rotavirus A NOT DETECTED NOT DETECTED Final   Sapovirus (I, II, IV, and V) NOT DETECTED NOT DETECTED Final    Comment: Performed at [facility], [address] [place], [place] [address]  C difficile quick scan w PCR reflex     Status: None   Collection Time: 05/20/18  3:19 AM  Result Value Ref Range Status   C Diff antigen NEGATIVE NEGATIVE Final   C Diff toxin NEGATIVE NEGATIVE Final   C Diff interpretation No C. difficile detected.  Final  Comment: Performed at [facility], [place] [address] [place], [place] [address]  MRSA PCR Screening     Status: None   Collection Time: 05/20/18 12:31 PM  Result Value Ref Range Status   MRSA by PCR NEGATIVE NEGATIVE Final    Comment:        The GeneXpert MRSA Assay (FDA approved for NASAL specimens only), is one component of a comprehensive MRSA  colonization surveillance program. It is not intended to diagnose MRSA infection nor to guide or monitor treatment for MRSA infections. Performed at [facility], [place] [address], [place], [place] [address]      Time coordinating discharge in minutes: 34  SIGNED:   Debbe Odea, MD  [facility] 05/22/2018, 8:58 AM Pager   If 7PM-7AM, please contact night-coverage [email or web address] Password [email or web address]

## 2018-05-31 ENCOUNTER — Other Ambulatory Visit: Payer: Self-pay | Admitting: Family Medicine

## 2018-05-31 DIAGNOSIS — Z1231 Encounter for screening mammogram for malignant neoplasm of breast: Secondary | ICD-10-CM

## 2018-06-07 ENCOUNTER — Ambulatory Visit: Payer: BLUE CROSS/BLUE SHIELD | Admitting: Registered"

## 2018-06-11 ENCOUNTER — Ambulatory Visit (INDEPENDENT_AMBULATORY_CARE_PROVIDER_SITE_OTHER): Payer: Self-pay | Admitting: Physician Assistant

## 2018-06-11 ENCOUNTER — Other Ambulatory Visit: Payer: Self-pay

## 2018-06-11 DIAGNOSIS — F319 Bipolar disorder, unspecified: Secondary | ICD-10-CM

## 2018-06-11 NOTE — Progress Notes (Signed)
10:06 LM on VM that I will call back in a few mins. 10:11 same as above 10:18 LM asking her to call office and r/s.

## 2018-06-16 ENCOUNTER — Ambulatory Visit: Payer: BLUE CROSS/BLUE SHIELD

## 2018-06-24 ENCOUNTER — Ambulatory Visit: Payer: BLUE CROSS/BLUE SHIELD | Admitting: Registered"

## 2018-07-09 ENCOUNTER — Ambulatory Visit: Payer: BLUE CROSS/BLUE SHIELD | Admitting: Physician Assistant

## 2018-07-13 ENCOUNTER — Ambulatory Visit: Payer: BLUE CROSS/BLUE SHIELD

## 2018-08-04 ENCOUNTER — Encounter: Payer: Self-pay | Admitting: Family

## 2018-08-04 ENCOUNTER — Other Ambulatory Visit: Payer: Self-pay

## 2018-08-04 ENCOUNTER — Ambulatory Visit (INDEPENDENT_AMBULATORY_CARE_PROVIDER_SITE_OTHER): Payer: BLUE CROSS/BLUE SHIELD | Admitting: Family

## 2018-08-04 ENCOUNTER — Ambulatory Visit: Payer: Self-pay

## 2018-08-04 VITALS — Ht 69.0 in | Wt 244.5 lb

## 2018-08-04 DIAGNOSIS — M79671 Pain in right foot: Secondary | ICD-10-CM

## 2018-08-04 DIAGNOSIS — B351 Tinea unguium: Secondary | ICD-10-CM | POA: Diagnosis not present

## 2018-08-04 DIAGNOSIS — Z981 Arthrodesis status: Secondary | ICD-10-CM | POA: Diagnosis not present

## 2018-08-04 DIAGNOSIS — M7671 Peroneal tendinitis, right leg: Secondary | ICD-10-CM

## 2018-08-04 MED ORDER — PREDNISONE 10 MG PO TABS: 10.0000 mg | ORAL_TABLET | Freq: Every day | ORAL | 0 refills | Status: DC

## 2018-08-04 MED ORDER — PREDNISONE 10 MG PO TABS: ORAL_TABLET | ORAL | 0 refills | Status: DC

## 2018-08-04 NOTE — Progress Notes (Signed)
Office Visit Note   Patient: Leah Powers           Date of Birth: [birth year]           MRN: [id number] Visit Date: 08/04/2018              Requested by: [facility] [address] [place], [place] [address] PCP: [facility]  Chief Complaint  Patient presents with  . Right Foot - Pain      HPI: The patient is a 58 year old woman who presents today complaining of what she describes as cramping pain to her right foot.  This is associated with swelling.  Has been ongoing since January of this year.  She wears a 15 to 20 mmHg graduated compression stocking daily.  No associated injuries.  Of note she is status post a right triple arthrodesis for posterior tibial tendon in July 2018.  Has had issues with swelling for 2 years now no pain like this prior to January.  Pain is worst with weightbearing.  Upon standing initial standing motion is most painful pain going up on her toes.  She is in what she feels is a supportive walking shoe today.  She does not have custom orthotics or insert orthotics.  She has been taking Aleve twice daily with no relief.  She is also complaining of thickened and discolored toenail her right great toe especially.  This is not painful there is no associated drainage or redness.  Assessment & Plan: Visit Diagnoses:  1. Pain in right foot   2. S/P ankle fusion   3. Onychomycosis     Plan: Working diagnosis is peroneal tendinitis.  Discussed treatment options with the patient.  She will continue with her supportive shoe wear discussed orthotic inserts in her shoes.  Will trial a 2-week course of low-dose prednisone and reevaluate at follow-up.  Discussed treatment for onychomycosis.  Patient in agreement with using superglue once weekly to the nail.  Follow-Up Instructions: Return in about 2 weeks (around 08/18/2018), or if symptoms worsen or fail to improve.   Ortho Exam  Patient is alert,  oriented, no adenopathy, well-dressed, normal affect, normal respiratory effort. On examination of the right foot there is mild edema to her foot.  There is no erythema no warmth there is tenderness along the course of the peroneal tendons and the insertion to the cuboid.  Pain with inversion.  The right great toenail is thickened and discolored.  She has onychomycotic mycotic nails to the right foot x3.  Imaging: No results found. No images are attached to the encounter.  Labs: Lab Results  Component Value Date   HGBA1C 5.9 (H) 05/26/2014   GRAMSTAIN No WBC Seen 06/13/2013   GRAMSTAIN No Squamous Epithelial Cells Seen 06/13/2013   GRAMSTAIN No Organisms Seen 06/13/2013   LABORGA Multiple Organisms Present,None Predominant 06/13/2013     Lab Results  Component Value Date   ALBUMIN 3.3 (L) 05/21/2018   ALBUMIN 4.4 05/19/2018   ALBUMIN 4.4 11/21/2014    Body mass index is 36.11 kg/m.  Orders:  No orders of the defined types were placed in this encounter.  Meds ordered this encounter  Medications  . DISCONTD: predniSONE (DELTASONE) 10 MG tablet    Sig: Take 1 tablet (10 mg total) by mouth daily with breakfast. Take 2 tablets by mouth daily with breakfast    Dispense:  60 tablet    Refill:  0  .  predniSONE (DELTASONE) 10 MG tablet    Sig: Take 2 tablets by mouth daily with breakfast    Dispense:  60 tablet    Refill:  0     Procedures: No procedures performed  Clinical Data: No additional findings.  ROS:  All other systems negative, except as noted in the HPI. Review of Systems  Constitutional: Negative for chills and fever.  Musculoskeletal: Positive for arthralgias and myalgias.    Objective: Vital Signs: Ht 5\' 9"  (1.753 m)   Wt 244 lb 8 oz (110.9 kg)   LMP 07/30/2013   BMI 36.11 kg/m   Specialty Comments:  No specialty comments available.  PMFS History: Patient Active Problem List   Diagnosis Date Noted  . Nausea vomiting and diarrhea 05/20/2018   . AKI (acute kidney injury) ([place]) 05/20/2018  . Lithium toxicity 05/20/2018  . DM2 (diabetes mellitus, type 2) ([place]) 05/20/2018  . Bipolar I disorder ([place]) 02/04/2018  . S/P ankle fusion 11/12/2016  . Posterior tibial tendon dysfunction (PTTD) of right lower extremity 10/08/2016  . Posterior tibial tendinitis, right leg   . HTN (hypertension) 05/31/2014  . Hypothyroidism 05/31/2014   Past Medical History:  Diagnosis Date  . Arthritis    L hand, R ankle  (10/08/2016)  . Bipolar 1 disorder ([place])   . Diabetes mellitus without complication ([place])   . Fibromyalgia    told that the breakout on the upper back , could be over active nerves , also treated with cream & Gabapentin- [facility] [place]   . History of kidney stones    spontaneous passing  . Hypertension   . Hypothyroid   . Pneumonia ~ 1967; 07/2016  . Pyelonephritis 1997   treated with IV antibiotic     Family History  Problem Relation Age of Onset  . Breast cancer Maternal Aunt   . Breast cancer Maternal Grandmother   . Sleep apnea Father   . Hypertension Father   . Diabetes Sister   . Hypertension Sister   . Asthma Sister   . Cancer Paternal Grandfather     Past Surgical History:  Procedure Laterality Date  . BLADDER INSTILLATION  X 2  . CHALAZION EXCISION Bilateral    R- eye X2 , L eye- X1  . FOOT ARTHRODESIS Right 10/08/2016   Procedure: RIGHT TRIPLE ARTHRODESIS;  Surgeon: Newt Minion, MD;  Location: [facility];  Service: Orthopedics;  Laterality: Right;  . FOOT ARTHRODESIS, TRIPLE Right 10/08/2016   ankle  . FRENULECTOMY, LINGUAL  1970s  . LAPAROSCOPIC CHOLECYSTECTOMY  1996  . PALATE SURGERY  1970s   tissue removed from upper palate as a child   . URETHRAL DILATION     Social History   Occupational History  . Not on file  Tobacco Use  . Smoking status: Never Smoker  . Smokeless tobacco: Never Used  Substance and Sexual Activity  . Alcohol use: No  . Drug use: No  . Sexual activity: Not  Currently

## 2018-08-05 ENCOUNTER — Ambulatory Visit: Payer: BLUE CROSS/BLUE SHIELD | Admitting: Physician Assistant

## 2018-08-11 ENCOUNTER — Telehealth: Payer: Self-pay | Admitting: Orthopedic Surgery

## 2018-08-11 MED ORDER — TRAMADOL HCL 50 MG PO TABS: 50.0000 mg | ORAL_TABLET | Freq: Two times a day (BID) | ORAL | 0 refills | Status: DC | PRN

## 2018-08-11 NOTE — Telephone Encounter (Signed)
Pt lmom stating she's having intense pain and request a call back ASP.

## 2018-08-11 NOTE — Telephone Encounter (Signed)
I called pt and advised per Leah Powers to use fx boot if she still has it or to minimize weight bearing and elevate, finish pred taper and said that we can call in something for pain. Pt states that she can not take motrin or Ibuprofen. Advised that she would take tramadol and follow up next week. As offered. Pt uses the [facility] on [address].

## 2018-08-13 ENCOUNTER — Telehealth: Payer: Self-pay | Admitting: *Deleted

## 2018-08-13 NOTE — Telephone Encounter (Signed)
Called pt but no answer at this time. Left voicemail message for pt to return call. Pt will need to be offered COVID-19 testing for potential exposure during recent visit.

## 2018-08-13 NOTE — Telephone Encounter (Signed)
Pt returned call to office and was offered testing for potential exposure during recent visit. Pt was asked if she would be interested in testing and pt then asked if she would have to go to [place] to be tested. Pt informed that testing would be done in [place] at the [facility] and the testing results were available within 72 hours. Pt states she has other things going on beside orthopedic issues and wanted to know what she needed to do and if she would be able to go to other appts. Attempted to explain to pt that she would need to isolate until testing results returned but before explanation given pt became upset, said never mind and ended the call.

## 2018-08-14 ENCOUNTER — Other Ambulatory Visit: Payer: BLUE CROSS/BLUE SHIELD

## 2018-08-14 ENCOUNTER — Telehealth: Payer: Self-pay

## 2018-08-14 DIAGNOSIS — Z20822 Contact with and (suspected) exposure to covid-19: Secondary | ICD-10-CM

## 2018-08-14 NOTE — Telephone Encounter (Signed)
Potential exposure to Covid 19 to [facility]. Pt called and order placed and appt made for today at 1000.

## 2018-08-16 ENCOUNTER — Other Ambulatory Visit: Payer: Self-pay | Admitting: Physician Assistant

## 2018-08-16 ENCOUNTER — Telehealth: Payer: Self-pay | Admitting: Physician Assistant

## 2018-08-16 ENCOUNTER — Ambulatory Visit: Payer: BLUE CROSS/BLUE SHIELD | Admitting: Physician Assistant

## 2018-08-16 LAB — NOVEL CORONAVIRUS, NAA: SARS-CoV-2, NAA: NOT DETECTED

## 2018-08-16 MED ORDER — LITHIUM CARBONATE 300 MG PO CAPS: 900.0000 mg | ORAL_CAPSULE | Freq: Every day | ORAL | 0 refills | Status: DC

## 2018-08-16 MED ORDER — LEVOTHYROXINE SODIUM 150 MCG PO TABS: 150.0000 ug | ORAL_TABLET | Freq: Every day | ORAL | 0 refills | Status: DC

## 2018-08-16 NOTE — Telephone Encounter (Signed)
Rx's sent in. °

## 2018-08-16 NOTE — Telephone Encounter (Signed)
Pt called exposed to [facility]. Tested will know results end of the week. I canc appt today. Need refill for Lithium & Synthroid @ [facility]

## 2018-08-18 ENCOUNTER — Other Ambulatory Visit: Payer: Self-pay

## 2018-08-18 ENCOUNTER — Encounter: Payer: Self-pay | Admitting: Family

## 2018-08-18 ENCOUNTER — Ambulatory Visit (INDEPENDENT_AMBULATORY_CARE_PROVIDER_SITE_OTHER): Payer: BC Managed Care – PPO | Admitting: Family

## 2018-08-18 VITALS — Ht 69.0 in | Wt 244.0 lb

## 2018-08-18 DIAGNOSIS — M76821 Posterior tibial tendinitis, right leg: Secondary | ICD-10-CM | POA: Diagnosis not present

## 2018-08-18 DIAGNOSIS — M7671 Peroneal tendinitis, right leg: Secondary | ICD-10-CM

## 2018-08-18 DIAGNOSIS — Z981 Arthrodesis status: Secondary | ICD-10-CM | POA: Diagnosis not present

## 2018-08-18 NOTE — Progress Notes (Signed)
Office Visit Note   Patient: Leah Powers           Date of Birth: Jul 21, 1960           MRN: [id number] Visit Date: 08/18/2018              Requested by: [place], [place] At [address] [place], [place] [address] PCP: [place], [place] At  Chief Complaint  Patient presents with  . Right Foot - Follow-up      HPI: The patient is a 58 year old woman who presents today in follow up for pain to her right foot and ankle. This is associated with swelling.    Has been ongoing since January of this year.  She wears a 15 to 20 mmHg graduated compression stocking daily.  No associated injuries.  Of note she is status post a right triple arthrodesis for posterior tibial tendon in July 2018.  Has had issues with swelling for 2 years now no pain like this prior to January.  Pain is worst with weightbearing.  Upon standing initial standing motion is most painful pain going up on her toes.  She is in what she feels is a supportive walking shoe today.  She does not have custom orthotics or insert orthotics.     She has been taking Aleve 3-5 times a day.   She has been taking 20 mg of prednisone daily and is feeling much better. Is in her short CAM boot. Is pain free in the boot. Does have improvement in pain in regular shoe wear. Has not totally resolved. Pleased with current treatment plan.   Assessment & Plan: Visit Diagnoses:  1. Peroneal tendonitis, right   2. S/P ankle fusion   3. Posterior tibial tendon dysfunction (PTTD) of right lower extremity     Plan: Working diagnosis is peroneal tendinitis.  Discussed treatment options with the patient.  She may advance to her supportive shoe wear as she is able, has discussed orthotic inserts in her shoes.  Continue prednisone, will refill this. May use Tramadol prn.   Follow-Up Instructions: Return in about 15 days (around 09/02/2018).   Ortho Exam  Patient is alert, oriented, no  adenopathy, well-dressed, normal affect, normal respiratory effort. On examination of the right foot there is mild edema to her foot.  There is no erythema no warmth there is tenderness along the course of the peroneal tendons and the insertion to the cuboid.  Pain with inversion.    Imaging: No results found. No images are attached to the encounter.  Labs: Lab Results  Component Value Date   HGBA1C 5.9 (H) 05/26/2014   GRAMSTAIN No WBC Seen 06/13/2013   GRAMSTAIN No Squamous Epithelial Cells Seen 06/13/2013   GRAMSTAIN No Organisms Seen 06/13/2013   LABORGA Multiple Organisms Present,None Predominant 06/13/2013     Lab Results  Component Value Date   ALBUMIN 3.3 (L) 05/21/2018   ALBUMIN 4.4 05/19/2018   ALBUMIN 4.4 11/21/2014    Body mass index is 36.03 kg/m.  Orders:  No orders of the defined types were placed in this encounter.  No orders of the defined types were placed in this encounter.    Procedures: No procedures performed  Clinical Data: No additional findings.  ROS:  All other systems negative, except as noted in the HPI. Review of Systems  Constitutional: Negative for chills and fever.  Musculoskeletal: Positive for arthralgias and myalgias.    Objective: Vital Signs: Ht 5\' 9"  (  1.753 m)   Wt 244 lb (110.7 kg)   LMP 07/30/2013   BMI 36.03 kg/m   Specialty Comments:  No specialty comments available.  PMFS History: Patient Active Problem List   Diagnosis Date Noted  . Nausea vomiting and diarrhea 05/20/2018  . AKI (acute kidney injury) ([place]) 05/20/2018  . Lithium toxicity 05/20/2018  . DM2 (diabetes mellitus, type 2) ([place]) 05/20/2018  . Bipolar I disorder ([place]) 02/04/2018  . S/P ankle fusion 11/12/2016  . Posterior tibial tendon dysfunction (PTTD) of right lower extremity 10/08/2016  . HTN (hypertension) 05/31/2014  . Hypothyroidism 05/31/2014   Past Medical History:  Diagnosis Date  . Arthritis    L hand, R ankle  (10/08/2016)  .  Bipolar 1 disorder ([place])   . Diabetes mellitus without complication ([place])   . Fibromyalgia    told that the breakout on the upper back , could be over active nerves , also treated with cream & Gabapentin- [facility] [place]   . History of kidney stones    spontaneous passing  . Hypertension   . Hypothyroid   . Pneumonia ~ 1967; 07/2016  . Pyelonephritis 1997   treated with IV antibiotic     Family History  Problem Relation Age of Onset  . Breast cancer Maternal Aunt   . Breast cancer Maternal Grandmother   . Sleep apnea Father   . Hypertension Father   . Diabetes Sister   . Hypertension Sister   . Asthma Sister   . Cancer Paternal Grandfather     Past Surgical History:  Procedure Laterality Date  . BLADDER INSTILLATION  X 2  . CHALAZION EXCISION Bilateral    R- eye X2 , L eye- X1  . FOOT ARTHRODESIS Right 10/08/2016   Procedure: RIGHT TRIPLE ARTHRODESIS;  Surgeon: Newt Minion, MD;  Location: [facility];  Service: Orthopedics;  Laterality: Right;  . FOOT ARTHRODESIS, TRIPLE Right 10/08/2016   ankle  . FRENULECTOMY, LINGUAL  1970s  . LAPAROSCOPIC CHOLECYSTECTOMY  1996  . PALATE SURGERY  1970s   tissue removed from upper palate as a child   . URETHRAL DILATION     Social History   Occupational History  . Not on file  Tobacco Use  . Smoking status: Never Smoker  . Smokeless tobacco: Never Used  Substance and Sexual Activity  . Alcohol use: No  . Drug use: No  . Sexual activity: Not Currently

## 2018-08-27 ENCOUNTER — Other Ambulatory Visit: Payer: Self-pay | Admitting: Family

## 2018-08-27 NOTE — Telephone Encounter (Signed)
Last refill was 08/11/18 #30 for peroneal tendonitis do you wish to refill?

## 2018-09-01 ENCOUNTER — Encounter: Payer: Self-pay | Admitting: Family

## 2018-09-01 ENCOUNTER — Other Ambulatory Visit: Payer: Self-pay

## 2018-09-01 ENCOUNTER — Ambulatory Visit (INDEPENDENT_AMBULATORY_CARE_PROVIDER_SITE_OTHER): Payer: BC Managed Care – PPO | Admitting: Family

## 2018-09-01 VITALS — Ht 69.0 in | Wt 244.0 lb

## 2018-09-01 DIAGNOSIS — Z981 Arthrodesis status: Secondary | ICD-10-CM | POA: Diagnosis not present

## 2018-09-01 DIAGNOSIS — M76821 Posterior tibial tendinitis, right leg: Secondary | ICD-10-CM | POA: Diagnosis not present

## 2018-09-01 NOTE — Progress Notes (Signed)
Office Visit Note   Patient: Leah Powers           Date of Birth: 20-Apr-1960           MRN: [id number] Visit Date: 09/01/2018              Requested by: [place], [place] At [address] [place],  [place] [address] PCP: [place], [place] At  Chief Complaint  Patient presents with  . Right Foot - Follow-up      HPI: The patient is a 58 year old woman who presents today in follow up for pain to her right foot and ankle.   This was associated with swelling. Had been ongoing since January of this year.  She wears a 15 to 20 mmHg graduated compression stocking daily.  No associated injuries.  Of note she is status post a right triple arthrodesis for posterior tibial tendon in July 2018.  Has had issues with swelling for 2 years now no pain like this prior to January.  Pain is worst with weightbearing.  Upon standing initial standing motion is most painful pain going up on her toes.  She is in what she feels is a supportive walking shoe today.  She does not have custom orthotics or insert orthotics.    She has been taking Aleve 3-5 times a day. She had been taking 20 mg of prednisone daily and is feeling much better.  And tramadol for breakthrough pain.  Is now in regular shoewear with no bracing.  She has weaned herself off the prednisone today is her first day without prednisone is currently pain-free.  She is no longer needing the Aleve but once a day.  She is quite pleased with her progress.  Assessment & Plan: Visit Diagnoses:  1. Posterior tibial tendon dysfunction (PTTD) of right lower extremity   2. S/P ankle fusion     Plan: Discussed the plan for possibility of repeat pain or flareup. Aleve bid x 2 weeks. She'll assess for supportive shoe wear. she will follow-up in the office as needed.  Follow-Up Instructions: No follow-ups on file.   Ortho Exam  Patient is alert, oriented, no adenopathy, well-dressed, normal affect,  normal respiratory effort. On examination of the right foot there is trace edema to her foot.  There is no erythema no warmth there is minimal tenderness along the course of the peroneal tendons and the insertion to the cuboid. No Pain with resisted inversion or eversion.    Imaging: No results found. No images are attached to the encounter.  Labs: Lab Results  Component Value Date   HGBA1C 5.9 (H) 05/26/2014   GRAMSTAIN No WBC Seen 06/13/2013   GRAMSTAIN No Squamous Epithelial Cells Seen 06/13/2013   GRAMSTAIN No Organisms Seen 06/13/2013   LABORGA Multiple Organisms Present,None Predominant 06/13/2013     Lab Results  Component Value Date   ALBUMIN 3.3 (L) 05/21/2018   ALBUMIN 4.4 05/19/2018   ALBUMIN 4.4 11/21/2014    Body mass index is 36.03 kg/m.  Orders:  No orders of the defined types were placed in this encounter.  No orders of the defined types were placed in this encounter.    Procedures: No procedures performed  Clinical Data: No additional findings.  ROS:  All other systems negative, except as noted in the HPI. Review of Systems  Constitutional: Negative for chills and fever.  Musculoskeletal: Positive for arthralgias and myalgias.    Objective: Vital Signs: Ht 5\' 9"  (  1.753 m)   Wt 244 lb (110.7 kg)   LMP 07/30/2013   BMI 36.03 kg/m   Specialty Comments:  No specialty comments available.  PMFS History: Patient Active Problem List   Diagnosis Date Noted  . Nausea vomiting and diarrhea 05/20/2018  . AKI (acute kidney injury) ([facility]) 05/20/2018  . Lithium toxicity 05/20/2018  . DM2 (diabetes mellitus, type 2) ([place]) 05/20/2018  . Bipolar I disorder ([place]) 02/04/2018  . S/P ankle fusion 11/12/2016  . Posterior tibial tendon dysfunction (PTTD) of right lower extremity 10/08/2016  . HTN (hypertension) 05/31/2014  . Hypothyroidism 05/31/2014   Past Medical History:  Diagnosis Date  . Arthritis    L hand, R ankle  (10/08/2016)  . Bipolar 1  disorder ([place])   . Diabetes mellitus without complication ([place])   . Fibromyalgia    told that the breakout on the upper back , could be over active nerves , also treated with cream & Gabapentin- [facility] [place]   . History of kidney stones    spontaneous passing  . Hypertension   . Hypothyroid   . Pneumonia ~ 1967; 07/2016  . Pyelonephritis 1997   treated with IV antibiotic     Family History  Problem Relation Age of Onset  . Breast cancer Maternal Aunt   . Breast cancer Maternal Grandmother   . Sleep apnea Father   . Hypertension Father   . Diabetes Sister   . Hypertension Sister   . Asthma Sister   . Cancer Paternal Grandfather     Past Surgical History:  Procedure Laterality Date  . BLADDER INSTILLATION  X 2  . CHALAZION EXCISION Bilateral    R- eye X2 , L eye- X1  . FOOT ARTHRODESIS Right 10/08/2016   Procedure: RIGHT TRIPLE ARTHRODESIS;  Surgeon: Newt Minion, MD;  Location: [facility];  Service: Orthopedics;  Laterality: Right;  . FOOT ARTHRODESIS, TRIPLE Right 10/08/2016   ankle  . FRENULECTOMY, LINGUAL  1970s  . LAPAROSCOPIC CHOLECYSTECTOMY  1996  . PALATE SURGERY  1970s   tissue removed from upper palate as a child   . URETHRAL DILATION     Social History   Occupational History  . Not on file  Tobacco Use  . Smoking status: Never Smoker  . Smokeless tobacco: Never Used  Substance and Sexual Activity  . Alcohol use: No  . Drug use: No  . Sexual activity: Not Currently

## 2018-09-04 ENCOUNTER — Other Ambulatory Visit: Payer: Self-pay

## 2018-09-04 ENCOUNTER — Ambulatory Visit
Admission: RE | Admit: 2018-09-04 | Discharge: 2018-09-04 | Disposition: A | Discharge status: Home / Self Care | Payer: BLUE CROSS/BLUE SHIELD | Source: Ambulatory Visit | Attending: Family Medicine | Admitting: Family Medicine

## 2018-09-04 DIAGNOSIS — Z1231 Encounter for screening mammogram for malignant neoplasm of breast: Secondary | ICD-10-CM

## 2018-10-27 ENCOUNTER — Other Ambulatory Visit: Payer: Self-pay

## 2018-10-27 ENCOUNTER — Ambulatory Visit (INDEPENDENT_AMBULATORY_CARE_PROVIDER_SITE_OTHER): Payer: BC Managed Care – PPO | Admitting: Physician Assistant

## 2018-10-27 ENCOUNTER — Encounter: Payer: Self-pay | Admitting: Physician Assistant

## 2018-10-27 DIAGNOSIS — E032 Hypothyroidism due to medicaments and other exogenous substances: Secondary | ICD-10-CM | POA: Diagnosis not present

## 2018-10-27 DIAGNOSIS — F319 Bipolar disorder, unspecified: Secondary | ICD-10-CM

## 2018-10-27 DIAGNOSIS — Z79899 Other long term (current) drug therapy: Secondary | ICD-10-CM | POA: Diagnosis not present

## 2018-10-27 NOTE — Progress Notes (Signed)
[facility] Med Check  Patient ID: Leah Powers,  MRN: [id number]  PCP: [place], [place] At  Date of Evaluation: 10/27/2018 Time spent:25 minutes  Chief Complaint:  Chief Complaint    Follow-up     Virtual Visit via Telephone Note  I connected with patient by a video enabled telemedicine application or telephone, with their informed consent, and verified patient privacy and that I am speaking with the correct person using two identifiers.  I am private, in my home and the patient is home.   I discussed the limitations, risks, security and privacy concerns of performing an evaluation and management service by telephone and the availability of in person appointments. I also discussed with the patient that there may be a patient responsible charge related to this service. The patient expressed understanding and agreed to proceed.   I discussed the assessment and treatment plan with the patient. The patient was provided an opportunity to ask questions and all were answered. The patient agreed with the plan and demonstrated an understanding of the instructions.   The patient was advised to call back or seek an in-person evaluation if the symptoms worsen or if the condition fails to improve as anticipated.  I provided 25 minutes of non-face-to-face time during this encounter.  HISTORY/CURRENT STATUS: HPI For routine med check.  Was in [place] 3/20.  See records.  She had Li level 05/19/18 at [facility] that was 1.9, then admitted to [facility] and on 05/21/18 w/ toxicity. Feels good now.  D/T COVID, hasn't been able to see me. Was exposed to it and it was neg.   Feels good mentally. Patient denies loss of interest in usual activities and is able to enjoy things.  Denies decreased energy or motivation.  Appetite has not changed.  No extreme sadness, tearfulness, or feelings of hopelessness.  Denies any changes in concentration, making decisions or remembering things.  Denies  suicidal or homicidal thoughts.  Patient denies increased energy with decreased need for sleep, no increased talkativeness, no racing thoughts, no impulsivity or risky behaviors, no increased spending, no increased libido, no grandiosity. No increased fatigue, n/v/c/d.No urinary sx.   Denies dizziness, syncope, seizures, numbness, tingling, tremor, tics, unsteady gait, slurred speech, confusion. Denies muscle or joint pain, stiffness, or dystonia.  Individual Medical History/ Review of Systems: Changes? :Yes see above.   Past medications for mental health diagnoses include: Prolixin, Haldol, Ativan, lithium, Xanax, Synthroid, gabapentin  Allergies: Eucalyptus oil, Haldol [haloperidol decanoate], Molds & smuts, Other, Penicillins, Bee venom, Corn-containing products, Demeclocycline, Dolobid [diflunisal], Vicodin [hydrocodone-acetaminophen], Doxycycline, Erythromycin, Keflex [cephalexin], Macrodantin, Motrin [ibuprofen], Septra [bactrim], Tetracyclines & related, and Tofranil-pm  Current Medications:  Current Outpatient Medications:  .  cetirizine (ZYRTEC) 5 MG tablet, Take 5 mg by mouth 2 (two) times daily as needed for allergies. , Disp: , Rfl:  .  gabapentin (NEURONTIN) 100 MG capsule, Take 100 mg by mouth 3 (three) times daily., Disp: , Rfl:  .  hydrochlorothiazide (HYDRODIURIL) 25 MG tablet, TAKE ONE TABLET BY MOUTH ONCE DAILY (Patient taking differently: Take 25 mg by mouth daily. ), Disp: 90 tablet, Rfl: 0 .  levothyroxine (SYNTHROID) 150 MCG tablet, Take 1 tablet (150 mcg total) by mouth daily before breakfast., Disp: 90 tablet, Rfl: 0 .  lithium carbonate 300 MG capsule, Take 3 capsules (900 mg total) by mouth daily., Disp: 270 capsule, Rfl: 0 .  losartan (COZAAR) 50 MG tablet, Take by mouth., Disp: , Rfl:  .  mupirocin ointment (BACTROBAN) 2 %,  Apply 1 application topically 2 (two) times daily. , Disp: , Rfl:  .  naproxen sodium (ALEVE) 220 MG tablet, , Disp: , Rfl:  .  triamcinolone  cream (KENALOG) 0.1 %, , Disp: , Rfl:  .  benazepril (LOTENSIN) 10 MG tablet, TAKE ONE TABLET BY MOUTH ONCE DAILY (Patient taking differently: Take 10 mg by mouth daily. ), Disp: 90 tablet, Rfl: 3 .  predniSONE (DELTASONE) 10 MG tablet, Take 2 tablets by mouth daily with breakfast (Patient not taking: Reported on 10/27/2018), Disp: 60 tablet, Rfl: 0 .  traMADol (ULTRAM) 50 MG tablet, TAKE 1 TABLET BY MOUTH EVERY 12 HOURS AS NEEDED (Patient not taking: Reported on 10/27/2018), Disp: 30 tablet, Rfl: 0 Medication Side Effects: none  Family Medical/ Social History: Changes? No  MENTAL HEALTH EXAM:  Last menstrual period 07/30/2013.There is no height or weight on file to calculate BMI.  General Appearance: unable to assess  Eye Contact:  unable to assess  Speech:  Clear and Coherent  Volume:  Normal  Mood:  Euthymic  Affect:  unable to assess  Thought Process:  Goal Directed  Orientation:  Full (Time, Place, and Person)  Thought Content: Logical   Suicidal Thoughts:  No  Homicidal Thoughts:  No  Memory:  WNL  Judgement:  Good  Insight:  Good  Psychomotor Activity:  unable to assess  Concentration:  Concentration: Good  Recall:  Good  Fund of Knowledge: Good  Language: Good  Assets:  Desire for Improvement  ADL's:  Intact  Cognition: WNL  Prognosis:  Good  See labs on chart.  BUN and creatinine were within normal limits in June.  Lithium level has not been checked since March and it was back down to 1.0  DIAGNOSES:    ICD-10-CM   1. Bipolar I disorder ([place])  F31.9   2. 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 Encounter for long-term (current) use of medications  Z79.899 Lithium level    TSH    Basic metabolic panel    Receiving Psychotherapy: No    RECOMMENDATIONS:  I spent 25 minutes with her discussing the hospitalization, signs and symptoms of lithium toxicity and provided counseling concerning that. Continue lithium 900 mg nightly. Continue Synthroid 150 mcg every  morning. Draw labs as above. Return in 3 months.  Donnal Moat, PA-C   This record has been created using [employer].  Chart creation errors have been sought, but may not always have been located and corrected. Such creation errors do not reflect on the standard of medical care.

## 2018-11-23 ENCOUNTER — Other Ambulatory Visit: Payer: Self-pay | Admitting: Physician Assistant

## 2018-12-01 ENCOUNTER — Telehealth: Payer: Self-pay | Admitting: Physician Assistant

## 2018-12-01 NOTE — Telephone Encounter (Signed)
Pt. Made aware and verbalized understanding.

## 2018-12-01 NOTE — Telephone Encounter (Signed)
See phone call note

## 2018-12-02 NOTE — Telephone Encounter (Signed)
ok 

## 2018-12-07 ENCOUNTER — Ambulatory Visit (INDEPENDENT_AMBULATORY_CARE_PROVIDER_SITE_OTHER): Payer: BC Managed Care – PPO | Admitting: Family

## 2018-12-07 ENCOUNTER — Ambulatory Visit: Payer: Self-pay

## 2018-12-07 ENCOUNTER — Other Ambulatory Visit: Payer: Self-pay

## 2018-12-07 ENCOUNTER — Encounter: Payer: Self-pay | Admitting: Family

## 2018-12-07 VITALS — Ht 69.0 in | Wt 244.0 lb

## 2018-12-07 DIAGNOSIS — M25571 Pain in right ankle and joints of right foot: Secondary | ICD-10-CM

## 2018-12-07 DIAGNOSIS — Z981 Arthrodesis status: Secondary | ICD-10-CM | POA: Diagnosis not present

## 2018-12-07 DIAGNOSIS — M76821 Posterior tibial tendinitis, right leg: Secondary | ICD-10-CM | POA: Diagnosis not present

## 2018-12-07 MED ORDER — PREDNISONE 10 MG PO TABS: ORAL_TABLET | ORAL | 0 refills | Status: DC

## 2018-12-07 NOTE — Progress Notes (Signed)
Office Visit Note   Patient: Leah Powers           Date of Birth: 06-Jan-1961           MRN: [id number] Visit Date: 12/07/2018              Requested by: [place], [place] At [address] [place],  [place] [address] PCP: [place], [place] At  Chief Complaint  Patient presents with  . Right Ankle - Follow-up      HPI: The patient is a 58 year old woman who presents today in follow up for pain to her right foot and ankle. Which has been gradually worsening since August.   This is again associated with swelling. She has been unable to don her compression stockings, feared they would get stuck on her great toe. Also states the ones she has are not providing adequate compression. She had been wearing 20-30 mm Hg of compression per her report.   She is status post a right triple arthrodesis for posterior tibial tendon in July 2018.  Has had issues with swelling for over 2 years now  Has pain with weight bearing. Especially with start up.    Has been in a post op shoe or cam walker as she feels to swollen to wear regular shoes.   Assessment & Plan: Visit Diagnoses:  1. Pain in right ankle and joints of right foot   2. S/P ankle fusion   3. Posterior tibial tendon dysfunction (PTTD) of right lower extremity     Plan: Discussed compression garments.  Did provide an order for new compression stockings to the medical supply.  Did provide a prescription for prednisone she will follow-up in the office in 4 weeks.  She will advanced to her regular shoe wear as she is able for support.  Follow-Up Instructions: No follow-ups on file.   Ortho Exam  Patient is alert, oriented, no adenopathy, well-dressed, normal affect, normal respiratory effort. On examination of the right foot there is mild edema to her foot and ankle.  There is no erythema no warmth there is minimal tenderness along the course of the peroneal tendons and lateral  ankle ligaments. No Pain with resisted inversion or eversion.    Imaging: No results found. No images are attached to the encounter.  Labs: Lab Results  Component Value Date   HGBA1C 5.9 (H) 05/26/2014   GRAMSTAIN No WBC Seen 06/13/2013   GRAMSTAIN No Squamous Epithelial Cells Seen 06/13/2013   GRAMSTAIN No Organisms Seen 06/13/2013   LABORGA Multiple Organisms Present,None Predominant 06/13/2013     Lab Results  Component Value Date   ALBUMIN 3.3 (L) 05/21/2018   ALBUMIN 4.4 05/19/2018   ALBUMIN 4.4 11/21/2014    Body mass index is 36.03 kg/m.  Orders:  Orders Placed This Encounter  Procedures  . XR Ankle Complete Right   Meds ordered this encounter  Medications  . predniSONE (DELTASONE) 10 MG tablet    Sig: Take 2 tablets by mouth daily with breakfast    Dispense:  60 tablet    Refill:  0     Procedures: No procedures performed  Clinical Data: No additional findings.  ROS:  All other systems negative, except as noted in the HPI. Review of Systems  Constitutional: Negative for chills and fever.  Musculoskeletal: Positive for arthralgias and myalgias.    Objective: Vital Signs: Ht 5\' 9"  (1.753 m)   Wt 244 lb (110.7 kg)   LMP  07/30/2013   BMI 36.03 kg/m   Specialty Comments:  No specialty comments available.  PMFS History: Patient Active Problem List   Diagnosis Date Noted  . Nausea vomiting and diarrhea 05/20/2018  . AKI (acute kidney injury) ([place]) 05/20/2018  . Lithium toxicity 05/20/2018  . DM2 (diabetes mellitus, type 2) ([facility]) 05/20/2018  . Bipolar I disorder ([place]) 02/04/2018  . S/P ankle fusion 11/12/2016  . Posterior tibial tendon dysfunction (PTTD) of right lower extremity 10/08/2016  . HTN (hypertension) 05/31/2014  . Hypothyroidism 05/31/2014   Past Medical History:  Diagnosis Date  . Arthritis    L hand, R ankle  (10/08/2016)  . Bipolar 1 disorder ([place])   . Diabetes mellitus without complication ([place])   . Fibromyalgia    told  that the breakout on the upper back , could be over active nerves , also treated with cream & Gabap[facility]- [facility] [place]   . History of kidney stones    spontaneous passing  . Hypertension   . Hypothyroid   . Pneumonia ~ 1967; 07/2016  . Pyelonephritis 1997   treated with IV antibiotic     Family History  Problem Relation Age of Onset  . Breast cancer Maternal Aunt 65  . Breast cancer Maternal Grandmother 35  . Sleep apnea Father   . Hypertension Father   . Diabetes Sister   . Hypertension Sister   . Asthma Sister   . Cancer Paternal Grandfather     Past Surgical History:  Procedure Laterality Date  . BLADDER INSTILLATION  X 2  . CHALAZION EXCISION Bilateral    R- eye X2 , L eye- X1  . FOOT ARTHRODESIS Right 10/08/2016   Procedure: RIGHT TRIPLE ARTHRODESIS;  Surgeon: Newt Minion, MD;  Location: [facility];  Service: Orthopedics;  Laterality: Right;  . FOOT ARTHRODESIS, TRIPLE Right 10/08/2016   ankle  . FRENULECTOMY, LINGUAL  1970s  . LAPAROSCOPIC CHOLECYSTECTOMY  1996  . PALATE SURGERY  1970s   tissue removed from upper palate as a child   . URETHRAL DILATION     Social History   Occupational History  . Not on file  Tobacco Use  . Smoking status: Never Smoker  . Smokeless tobacco: Never Used  Substance and Sexual Activity  . Alcohol use: No  . Drug use: No  . Sexual activity: Not Currently

## 2018-12-21 ENCOUNTER — Telehealth: Payer: Self-pay | Admitting: Physician Assistant

## 2018-12-21 NOTE — Telephone Encounter (Signed)
Pt requesting to speak with Helene Kelp concerning some life changes. Having concerns if she can continue to see her due to these issues. Please call.

## 2019-01-06 ENCOUNTER — Ambulatory Visit (INDEPENDENT_AMBULATORY_CARE_PROVIDER_SITE_OTHER): Payer: BC Managed Care – PPO | Admitting: Orthopedic Surgery

## 2019-01-06 ENCOUNTER — Other Ambulatory Visit: Payer: Self-pay

## 2019-01-06 ENCOUNTER — Telehealth: Payer: Self-pay | Admitting: Orthopedic Surgery

## 2019-01-06 DIAGNOSIS — B351 Tinea unguium: Secondary | ICD-10-CM

## 2019-01-06 DIAGNOSIS — M25571 Pain in right ankle and joints of right foot: Secondary | ICD-10-CM | POA: Diagnosis not present

## 2019-01-06 DIAGNOSIS — M76821 Posterior tibial tendinitis, right leg: Secondary | ICD-10-CM

## 2019-01-06 DIAGNOSIS — M7671 Peroneal tendinitis, right leg: Secondary | ICD-10-CM

## 2019-01-06 MED ORDER — PREDNISONE 10 MG PO TABS: ORAL_TABLET | ORAL | 0 refills | Status: DC

## 2019-01-06 NOTE — Telephone Encounter (Signed)
RX SENT TO BOTH PHARMACIES

## 2019-01-06 NOTE — Telephone Encounter (Signed)
Dr Sharol Given please advise, thank you. Patient was seen today in our office.

## 2019-01-06 NOTE — Telephone Encounter (Signed)
Patient called. Says her medication should have been called in to [facility] on [address]. Her call back number is [phone]

## 2019-01-07 ENCOUNTER — Encounter: Payer: Self-pay | Admitting: Orthopedic Surgery

## 2019-01-07 NOTE — Progress Notes (Signed)
Office Visit Note   Patient: Leah Powers           Date of Birth: September 16, 1960           MRN: [id number] Visit Date: 01/06/2019              Requested by: [facility] [address] [place],  [place] [id number] PCP: [place], [place] At  Chief Complaint  Patient presents with   Right Foot - Follow-up      HPI: Patient is a 58 year old woman who presents for evaluation of her right foot and ankle.  She is 2 years status post right foot triple arthrodesis.  Patient currently has been taking prednisone 20 mg in the morning and wearing 20 to 30 mm compression stockings.  She states she occasionally wears a fracture boot.  She states she still having some mild symptoms in her ankle but feels like the swelling has decreased and her symptoms have improved.  Patient states she is also status post ablation of the right great toenail removed by podiatry.  Patient states her plantar fascia has improved and her Achilles tendon has gotten better.  Assessment & Plan: Visit Diagnoses:  1. Posterior tibial tendon dysfunction (PTTD) of right lower extremity   2. Pain in right ankle and joints of right foot   3. Peroneal tendonitis, right   4. Onychomycosis     Plan: We will have her continue with the compression socks.  Recommended weaning off the prednisone and she will take 1 tablet a day for a week and then 1 tablet every other day for a week and then get off the prednisone.  Follow-Up Instructions: Return if symptoms worsen or fail to improve.   Ortho Exam  Patient is alert, oriented, no adenopathy, well-dressed, normal affect, normal respiratory effort. Examination patient has good pulses there is no cellulitis she has venous stasis swelling with brawny edema but no open ulcers.  Is currently wearing new balance sneakers.  The nail ablation has healed well.  She has no pain to palpation over the plantar fascia no pain with subtalar  motion or ankle motion.  The Achilles tendon is nontender to palpation.  Imaging: No results found. No images are attached to the encounter.  Labs: Lab Results  Component Value Date   HGBA1C 5.9 (H) 05/26/2014   GRAMSTAIN No WBC Seen 06/13/2013   GRAMSTAIN No Squamous Epithelial Cells Seen 06/13/2013   GRAMSTAIN No Organisms Seen 06/13/2013   LABORGA Multiple Organisms Present,None Predominant 06/13/2013     Lab Results  Component Value Date   ALBUMIN 3.3 (L) 05/21/2018   ALBUMIN 4.4 05/19/2018   ALBUMIN 4.4 11/21/2014    Lab Results  Component Value Date   MG 2.8 (H) 05/20/2018   No results found for: VD25OH  No results found for: PREALBUMIN CBC EXTENDED Latest Ref Rng & Units 05/20/2018 05/19/2018 10/02/2016  WBC 4.0 - 10.5 K/uL 11.3(H) 11.5(H) 9.3  RBC 3.87 - 5.11 MIL/uL 4.56 4.88 4.47  HGB 12.0 - 15.0 g/dL 12.6 13.4 13.0  HCT 36.0 - 46.0 % 40.5 43.8 41.4  PLT 150 - 400 K/uL 519(H) 579(H) 349  NEUTROABS 1.7 - 7.7 K/uL - - -  LYMPHSABS 0.7 - 4.0 K/uL - - -     There is no height or weight on file to calculate BMI.  Orders:  No orders of the defined types were placed in this encounter.  Meds ordered this encounter  Medications   DISCONTD: predniSONE (DELTASONE) 10 MG tablet    Sig: Take 1 by mouth daily with food for 1 week. Then 1 tab every other day .    Dispense:  30 tablet    Refill:  0   predniSONE (DELTASONE) 10 MG tablet    Sig: Take 1 by mouth daily with food for 1 week. Then 1 tab every other day .    Dispense:  30 tablet    Refill:  0     Procedures: No procedures performed  Clinical Data: No additional findings.  ROS:  All other systems negative, except as noted in the HPI. Review of Systems  Objective: Vital Signs: LMP 07/30/2013   Specialty Comments:  No specialty comments available.  PMFS History: Patient Active Problem List   Diagnosis Date Noted   Nausea vomiting and diarrhea 05/20/2018   AKI (acute kidney injury) ([place])  05/20/2018   Lithium toxicity 05/20/2018   DM2 (diabetes mellitus, type 2) ([facility]) 05/20/2018   Bipolar I disorder ([place]) 02/04/2018   S/P ankle fusion 11/12/2016   Posterior tibial tendon dysfunction (PTTD) of right lower extremity 10/08/2016   HTN (hypertension) 05/31/2014   Hypothyroidism 05/31/2014   Past Medical History:  Diagnosis Date   Arthritis    L hand, R ankle  (10/08/2016)   Bipolar 1 disorder (HCC)    Diabetes mellitus without complication (HCC)    Fibromyalgia    told that the breakout on the upper back , could be over active nerves , also treated with cream & Gabapentin- [facility] [place]    History of kidney stones    spontaneous passing   Hypertension    Hypothyroid    Pneumonia ~ 1967; 07/2016   Pyelonephritis 1997   treated with IV antibiotic     Family History  Problem Relation Age of Onset   Breast cancer Maternal Aunt 65   Breast cancer Maternal Grandmother 12   Sleep apnea Father    Hypertension Father    Diabetes Sister    Hypertension Sister    Asthma Sister    Cancer Paternal Grandfather     Past Surgical History:  Procedure Laterality Date   BLADDER INSTILLATION  X 2   CHALAZION EXCISION Bilateral    R- eye X2 , L eye- X1   FOOT ARTHRODESIS Right 10/08/2016   Procedure: RIGHT TRIPLE ARTHRODESIS;  Surgeon: Newt Minion, MD;  Location: [facility] [place];  Service: Orthopedics;  Laterality: Right;   FOOT ARTHRODESIS, TRIPLE Right 10/08/2016   ankle   FRENULECTOMY, LINGUAL  1970s   LAPAROSCOPIC CHOLECYSTECTOMY  1996   PALATE SURGERY  1970s   tissue removed from upper palate as a child    URETHRAL DILATION     Social History   Occupational History   Not on file  Tobacco Use   Smoking status: Never Smoker   Smokeless tobacco: Never Used  Substance and Sexual Activity   Alcohol use: No   Drug use: No   Sexual activity: Not Currently

## 2019-01-17 ENCOUNTER — Encounter: Payer: Self-pay | Admitting: Physician Assistant

## 2019-01-17 ENCOUNTER — Other Ambulatory Visit: Payer: Self-pay

## 2019-01-17 ENCOUNTER — Ambulatory Visit (INDEPENDENT_AMBULATORY_CARE_PROVIDER_SITE_OTHER): Payer: BC Managed Care – PPO | Admitting: Physician Assistant

## 2019-01-17 DIAGNOSIS — E032 Hypothyroidism due to medicaments and other exogenous substances: Secondary | ICD-10-CM

## 2019-01-17 DIAGNOSIS — F319 Bipolar disorder, unspecified: Secondary | ICD-10-CM | POA: Diagnosis not present

## 2019-01-17 MED ORDER — LEVOTHYROXINE SODIUM 150 MCG PO TABS: ORAL_TABLET | ORAL | 0 refills | Status: DC

## 2019-01-17 MED ORDER — LITHIUM CARBONATE 300 MG PO CAPS: 900.0000 mg | ORAL_CAPSULE | Freq: Every day | ORAL | 0 refills | Status: DC

## 2019-01-17 NOTE — Progress Notes (Signed)
[facility] Med Check  Patient ID: Leah Powers,  MRN: [id number]  PCP: [place], [place] At  Date of Evaluation: 01/17/2019 Time spent:15 minutes  Chief Complaint:  Chief Complaint    Follow-up      HISTORY/CURRENT STATUS: HPI For 3 month med check.  Doing well. Feels that Leah Powers is working well with moods.  No increased irritability. Patient denies increased energy with decreased need for sleep, no increased talkativeness, no racing thoughts, no impulsivity or risky behaviors, no increased spending, no increased libido, no grandiosity. No hallucinations.  Patient denies loss of interest in usual activities and is able to enjoy things.  Denies decreased energy or motivation.  Appetite has not changed.  No extreme sadness, tearfulness, or feelings of hopelessness.  Denies any changes in concentration, making decisions or remembering things.  Denies suicidal or homicidal thoughts.  Important-she went off the SSRI when was in hospital last spring. Was on it b/c neuropsych rash/itching.  She hasn't noticed a difference either way, off the SSRI. Rash gets worse in summer, but has been on Prednisone for tendonitis in ankle and it   Has had some anxiety d/t husband's hours cut and she was told her ins may be d/c. But that wasn't the case. Also they have been building a new deck so that was a little stressful. But in a good way. Sleeps well.   Denies dizziness, syncope, seizures, numbness, tingling, tremor, tics, unsteady gait, slurred speech, confusion. Denies muscle or joint pain, stiffness, or dystonia.  Individual Medical History/ Review of Systems: Changes? :No    Past medications for mental health diagnoses include: Prolixin, Haldol, Ativan, lithium, Xanax, Synthroid, gabapentin  Allergies: Eucalyptus oil, Haldol [haloperidol decanoate], Molds & smuts, Other, Penicillins, Bee venom, Corn-containing products, Demeclocycline, Dolobid [diflunisal], Vicodin  [hydrocodone-acetaminophen], Doxycycline, Erythromycin, Keflex [cephalexin], Macrodantin, Motrin [ibuprofen], Septra [bactrim], Tetracyclines & related, and Tofranil-pm  Current Medications:  Current Outpatient Medications:  .  cetirizine (ZYRTEC) 5 MG tablet, Take 5 mg by mouth 2 (two) times daily as needed for allergies. , Disp: , Rfl:  .  gabapentin (NEURONTIN) 100 MG capsule, Take 300 mg by mouth 3 (three) times daily. , Disp: , Rfl:  .  hydrochlorothiazide (HYDRODIURIL) 25 MG tablet, TAKE ONE TABLET BY MOUTH ONCE DAILY (Patient taking differently: Take 25 mg by mouth daily. ), Disp: 90 tablet, Rfl: 0 .  levothyroxine (SYNTHROID) 150 MCG tablet, TAKE 1 TABLET BY MOUTH ONCE DAILY BEFORE BREAKFST, Disp: 90 tablet, Rfl: 0 .  lithium carbonate 300 MG capsule, Take 3 capsules (900 mg total) by mouth daily., Disp: 270 capsule, Rfl: 0 .  mupirocin ointment (BACTROBAN) 2 %, Apply 1 application topically 2 (two) times daily. , Disp: , Rfl:  .  naproxen sodium (ALEVE) 220 MG tablet, , Disp: , Rfl:  .  predniSONE (DELTASONE) 10 MG tablet, Take 1 by mouth daily with food for 1 week. Then 1 tab every other day ., Disp: 30 tablet, Rfl: 0 .  triamcinolone cream (KENALOG) 0.1 %, , Disp: , Rfl:  .  benazepril (LOTENSIN) 10 MG tablet, TAKE ONE TABLET BY MOUTH ONCE DAILY (Patient taking differently: Take 10 mg by mouth daily. ), Disp: 90 tablet, Rfl: 3 .  losartan (COZAAR) 50 MG tablet, Take by mouth., Disp: , Rfl:  .  predniSONE (DELTASONE) 10 MG tablet, Take 2 tablets by mouth daily with breakfast (Patient not taking: Reported on 01/17/2019), Disp: 60 tablet, Rfl: 0 .  traMADol (ULTRAM) 50 MG tablet, TAKE 1 TABLET  BY MOUTH EVERY 12 HOURS AS NEEDED (Patient not taking: Reported on 10/27/2018), Disp: 30 tablet, Rfl: 0 Medication Side Effects: none  Family Medical/ Social History: Changes? No  MENTAL HEALTH EXAM:  Last menstrual period 07/30/2013.There is no height or weight on file to calculate BMI.  General  Appearance: Casual, Neat, Well Groomed and Obese  Eye Contact:  Good  Speech:  Clear and Coherent  Volume:  Normal  Mood:  Euthymic  Affect:  Appropriate  Thought Process:  Goal Directed and Descriptions of Associations: Intact  Orientation:  Full (Time, Place, and Person)  Thought Content: Logical   Suicidal Thoughts:  No  Homicidal Thoughts:  No  Memory:  WNL  Judgement:  Good  Insight:  Good  Psychomotor Activity:  Normal  Concentration:  Concentration: Good  Recall:  Good  Fund of Knowledge: Good  Language: Good  Assets:  Desire for Improvement  ADL's:  Intact  Cognition: WNL  Prognosis:  Good  Labs 11/19/2018 Lithium level 0.8, TSH 3.3, BUN 16, Cr 0.79  DIAGNOSES:    ICD-10-CM   1. Bipolar I disorder (HCC)  F31.9 Lithium level  2. 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 Cont Gabapentin 300 mg tid per PCP Cont Li 300 mg, 3 qd. Cont Synthroid 150 mcg q am Draw Lithium level in Dec. Return in 6 months.   Donnal Moat, PA-C

## 2019-01-24 DIAGNOSIS — R0683 Snoring: Secondary | ICD-10-CM | POA: Insufficient documentation

## 2019-01-24 DIAGNOSIS — R0681 Apnea, not elsewhere classified: Secondary | ICD-10-CM | POA: Insufficient documentation

## 2019-02-02 ENCOUNTER — Encounter: Payer: Self-pay | Admitting: Physician Assistant

## 2019-02-03 ENCOUNTER — Other Ambulatory Visit: Payer: Self-pay | Admitting: Physician Assistant

## 2019-02-03 ENCOUNTER — Other Ambulatory Visit: Payer: Self-pay

## 2019-02-03 MED ORDER — LITHIUM CARBONATE 300 MG PO CAPS: 900.0000 mg | ORAL_CAPSULE | Freq: Every day | ORAL | 0 refills | Status: DC

## 2019-02-03 NOTE — Progress Notes (Signed)
Pt. Made aware and stated that she does need a refill on her Lithium. Please send to [facility] on [address]. She has 2 weeks left and her bottle says no refills.

## 2019-02-03 NOTE — Progress Notes (Signed)
Let her know that [name] level is good at 0.7.  Stay on same dose.

## 2019-03-21 ENCOUNTER — Telehealth: Payer: Self-pay | Admitting: Orthopedic Surgery

## 2019-03-21 NOTE — Telephone Encounter (Signed)
Patient called. She needs a new stocking. [phone] sent [facility]. Fax number is [phone] Leah Powers.

## 2019-03-28 ENCOUNTER — Telehealth: Payer: Self-pay | Admitting: Orthopedic Surgery

## 2019-03-28 NOTE — Telephone Encounter (Signed)
Rx will be faxed to [facility]: Ronalee Belts at [phone]

## 2019-03-28 NOTE — Telephone Encounter (Signed)
Patient called asked if she can be called when fax is sent to [facility] or do she need to pick the Rx up instead? The number to patient is [phone]

## 2019-03-29 ENCOUNTER — Other Ambulatory Visit: Payer: Self-pay | Admitting: Family Medicine

## 2019-03-29 ENCOUNTER — Telehealth: Payer: Self-pay | Admitting: Orthopedic Surgery

## 2019-03-29 DIAGNOSIS — Z1231 Encounter for screening mammogram for malignant neoplasm of breast: Secondary | ICD-10-CM

## 2019-03-29 NOTE — Telephone Encounter (Signed)
I tried to call patient but got static through phone. Patient's Rx for [facility] has been faxed on 03/28/2019 via [facility].

## 2019-03-29 NOTE — Telephone Encounter (Signed)
Spoke with patient advised Rx was faxed over to [facility] 03/28/2019 per phone message in chart

## 2019-04-13 ENCOUNTER — Other Ambulatory Visit: Payer: Self-pay

## 2019-04-13 ENCOUNTER — Ambulatory Visit (INDEPENDENT_AMBULATORY_CARE_PROVIDER_SITE_OTHER): Payer: 59 | Admitting: Addiction (Substance Use Disorder)

## 2019-04-13 DIAGNOSIS — F4323 Adjustment disorder with mixed anxiety and depressed mood: Secondary | ICD-10-CM | POA: Diagnosis not present

## 2019-04-13 DIAGNOSIS — F319 Bipolar disorder, unspecified: Secondary | ICD-10-CM
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
## 2019-04-20 ENCOUNTER — Other Ambulatory Visit: Payer: Self-pay

## 2019-04-20 ENCOUNTER — Encounter: Payer: 59 | Attending: Family Medicine | Admitting: Registered"

## 2019-04-20 ENCOUNTER — Encounter: Payer: Self-pay | Admitting: Registered"

## 2019-04-20 DIAGNOSIS — E119 Type 2 diabetes mellitus without complications: Secondary | ICD-10-CM | POA: Diagnosis not present

## 2019-04-20 NOTE — Patient Instructions (Addendum)
Instructions/Goals:  Make sure to get in three meals per day. Try to have balanced meals like the My Plate example (see handout). Include lean proteins, vegetables, fruits, and whole grains at meals.   Have 2-3 carbohydrate choices per meal  If having symptoms of low blood sugar (shakiness, feeling faint, confused, etc (see booklet) check blood sugar and if 70 or less treat with 15/15 rule on page 13  Recommend talking with you doctor about prescription for glucose strips and lancet and meter if one provided is not covered by insurance.   Check with your insurance about which meter they cover  Recommend checking fasting in the morning and 1 time 1-2 hours after a meal.   Goals: 80-130 fasting and less than 180 for 1-2 hours after a meal   70 or less is low and needs to be treated with 15/15 rule on page 13

## 2019-04-20 NOTE — Progress Notes (Addendum)
Diabetes Self-Management Education  Visit Type:    Appt. Start Time: 1430 Appt. End Time: [id number]  04/20/2019  Leah Powers, identified by name and date of birth, is a 59 y.o. female with a diagnosis of Diabetes:  .   ASSESSMENT  Last menstrual period 07/30/2013. Body mass index is 39.02 kg/m.   Nutrition Follow-Up: Pt present for appointment. Pt reports since last year she was hospitalized due to dehydration after having frequent diarrhea and vomiting due to adverse reaction to Metformin per pt. Reports she was told she didn't have diabetes and she thought her insurance would not cover for her to come for nutrition appointments. Then recently she went back to her doctor and her A1c was 7.4 on 03/25/19 per pt report and was told again that she does have diabetes and thus, her insurance will cover nutrition counseling for her diabetes.   Pt reports she has never checked blood sugar at home. 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 Pt was open to checking at home once dietitian went through steps of checking glucose via viewing in-person glucometer and steps as well as viewing video.  Pt reports her next MD appointment is February 15.   Breakfast: Crispix cereal, milk, apple juice Snk: water  Lunch: fried rice, chicken, chicken and cashew dish (carrots, peas, water chestnut, chicken), water  Snk (330-4 PM): orange  Dinner: grilled chicken sandwich with pickles, lettuce, tomato, tortilini soup, half cup milk, water   Glucose Meter Provided: Accu-Chek Guide Me Expiration Date: 05/18/20 [id number]  Food Allergies/Intolerances: Pt reports that she cannot tolerate whole corn. Reports milled corn is ok such as that in corn chips or corn bread. Also reports that she tolerates popcorn. Pt reports she is allergic to bleu cheese.   Noted Lab Values: 03/21/19:  Hgb A1c: 7.4 Triglycerides: 209 LDL:  130  02/15/18:  Hgb A1c: 6.6 Triglycerides: 156 LDL Cholesterol: 145  Individualized Plan for Diabetes Self-Management Training:   Learning Objective:  Patient will have a greater understanding of diabetes self-management. Patient education plan is to attend individual and/or group sessions per assessed needs and concerns.   Plan:  Reviewed treatment for hypoglycemia using 15/15 rule. Provided instructions for checking glucose/using glucometer, when to check, and blood sugar goals. Advised pt to check with insurance to ensure meter provided is covered and asking MD at next appointment this month about prescription for tests strips and lancets for meter. Pt appeared agreeable to information/goals discussed.   Patient Instructions  Instructions/Goals:  Make sure to get in three meals per day. Try to have balanced meals like the My Plate example (see handout). Include lean proteins, vegetables, fruits, and whole grains at meals.   Have 2-3 carbohydrate choices per meal  If having symptoms of low blood sugar (shakiness, feeling faint, confused, etc (see booklet) check blood sugar and if 70 or less treat with 15/15 rule on page 13  Recommend talking with you doctor about prescription for glucose strips and lancet and meter if one provided is not covered by insurance.   Check with your insurance about which meter they cover  Recommend checking fasting in the morning and 1 time 1-2 hours after a meal.   Goals: 80-130 fasting and less than 180 for 1-2 hours after a meal   70 or less is low and needs to be treated with 15/15 rule on page 13  Instructions/Goals:  Make sure to get in three meals per day. Try to have balanced meals like the My Plate example (see handout). Include lean proteins, vegetables, fruits, and whole grains at meals.   Have 2-3 carbohydrate choices per meal  If having symptoms of low blood sugar (shakiness, feeling faint, confused, etc (see booklet) check  blood sugar and if 70 or less treat with 15/15 rule on page 13  Recommend talking with you doctor about prescription for glucose strips and lancet and meter if one provided is not covered by insurance.   Check with your insurance about which meter they cover  Recommend checking fasting in the morning and 1 time 1-2 hours after a meal.   Goals: 80-130 fasting and less than 180 for 1-2 hours after a meal  70 or less is low and needs to be treated with 15/15 rule on page 13  Expected Outcomes:   Expect Positive Outcomes  Education material provided: Updated booklet.   If problems or questions, patient to contact team via:  Phone, Email and My Chart   Future DSME appointment:  3 weeks Pt requested next appointment within <1 month due to concerns about elevated A1c and starting to self monitor blood glucose.

## 2019-04-26 ENCOUNTER — Other Ambulatory Visit: Payer: Self-pay

## 2019-04-26 ENCOUNTER — Encounter: Payer: Self-pay | Admitting: Addiction (Substance Use Disorder)

## 2019-04-26 ENCOUNTER — Encounter: Payer: Self-pay | Admitting: Registered"

## 2019-04-26 ENCOUNTER — Ambulatory Visit (INDEPENDENT_AMBULATORY_CARE_PROVIDER_SITE_OTHER): Payer: 59 | Admitting: Addiction (Substance Use Disorder)

## 2019-04-26 DIAGNOSIS — F319 Bipolar disorder, unspecified: Secondary | ICD-10-CM

## 2019-04-26 DIAGNOSIS — F4323 Adjustment disorder with mixed anxiety and depressed mood: Secondary | ICD-10-CM

## 2019-04-26 NOTE — Progress Notes (Signed)
[facility] Counselor/Therapist Progress Note  Patient ID: Leah Powers, MRN: [id number],    Date: 04/26/2019  Time Spent: 11:06-11:59 53 mins   Mental Status Exam:  Appearance:   Well Groomed     Behavior:  Appropriate  Motor:  Normal  Speech/Language:   Clear and Coherent  Affect:  Congruent, Labile and Full Range  Mood:  anxious, euphoric and irritable  Thought process:  flight of ideas, loose associations and tangential  Thought content:    Rumination and Tangential  Sensory/Perceptual disturbances:    WNL  Orientation:  x4  Attention:  Fair  Concentration:  Good  Memory:  WNL  Fund of knowledge:   Good  Insight:    Good  Judgment:   Fair  Impulse Control:  Good   Risk Assessment: Danger to Self:  No Self-injurious Behavior: No Danger to Others: No Duty to Warn:no Physical Aggression / Violence:No  Access to Firearms a concern: No  Gang Involvement:No   Subjective: Client came in reporting feeling a lot more positive in relation to her foot pain after getting a pedicure- the first in her life. Client reported her excitement related to get a massage today and having budgeted it into her monthly expenses. Client getting better financially even having less work and her husband being on disability. Client processed her frustration with her husbands disabilities, belittling, gas lighting, nagging that made her feel like his nurse and with all the responsibility. Therapist used MI & CBT to provide support, summarize to understand client's tangential flight of ideas. Therapist reflected back clients thoughts to help her process her thoughts that fuel her emotional dysregulation. Client made progress identifying some of the thoughts/emotions that connected back to her feelings of abandonment or being misunderstood when she was young and processed this with therapist and therapist helped client calm using meditation.  Client and therapist discussed realistic expectations of how  to honor/love one another and not be verbally aggressive/etc. Therapist helped client to roleplay those discussions with her husband to continue improving their relationship.   Interventions: Cognitive Behavioral Therapy, Mindfulness Meditation and Motivational Interviewing  Diagnosis:   ICD-10-CM   1. Bipolar I disorder ([place])  F31.9   2. Adjustment disorder with mixed anxiety and depressed mood  F43.23    Plan of Care: Client to return for biweekly therapy with Sammuel Cooper, therapist, to review again in 6 months. Client to engage in positive self talk and challenging negative internal ruminationsand self talk causing client to be overly anxious and worriedusing CBT, on daily practice. Client to engage in mindfulness: ie body scans each eveneing to help process and discharge emotional distress & recognize emotions & regulate her mood. Client to continue engaging in medication management to continue taking [facility] medications for her health.  Client to utilize BSP (brainspotting) with therapist to help client regulate their energy in a somatic-felt sense way to decrease hypermanic symptoms to stabilize her moods, relationships, and help her manage her life needs and reduce anxietyby 33% in the next 6 months. Client to prioritize sleep 8+ hours each week night AEB going to bed by 10pm each night. Client participated in the treatment planning of their therapy. Client agreed with the plan and understands what to do if there is a crisis: call 9-1-1 and/or crisis line given by therapist.   Barnie Del, LCSW, LCAS, CCTP, CCS-I, BSP

## 2019-05-09 ENCOUNTER — Ambulatory Visit (INDEPENDENT_AMBULATORY_CARE_PROVIDER_SITE_OTHER): Payer: 59 | Admitting: Addiction (Substance Use Disorder)

## 2019-05-09 ENCOUNTER — Encounter: Payer: Self-pay | Admitting: Addiction (Substance Use Disorder)

## 2019-05-09 ENCOUNTER — Other Ambulatory Visit: Payer: Self-pay

## 2019-05-09 DIAGNOSIS — F319 Bipolar disorder, unspecified: Secondary | ICD-10-CM | POA: Diagnosis not present

## 2019-05-09 DIAGNOSIS — F4323 Adjustment disorder with mixed anxiety and depressed mood: Secondary | ICD-10-CM | POA: Diagnosis not present
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
## 2019-05-11 ENCOUNTER — Other Ambulatory Visit: Payer: Self-pay

## 2019-05-11 ENCOUNTER — Encounter: Payer: 59 | Admitting: Registered"

## 2019-05-11 DIAGNOSIS — E119 Type 2 diabetes mellitus without complications: Secondary | ICD-10-CM | POA: Diagnosis not present

## 2019-05-11 NOTE — Patient Instructions (Addendum)
Instructions/Goals:  Make sure to get in three meals per day. Try to have balanced meals like the My Plate example (see handout). Include lean proteins, vegetables, fruits, and whole grains at meals.   Have 2-3 carbohydrate choices per meal. Include a balance of food groups at each meal.   Recommend trying carbonated water lemon/lime  Continue working to limit fried foods and those high in saturated fat (see handout)   If having symptoms of low blood sugar (shakiness, feeling faint, confused, etc (see booklet) If 70 or less treat with 15/15 rule on page 13  Continue having hgbA1c checked regularly (every 2-3 months) with your doctor to keep a check on blood sugar.   Make physical activity a part of your week. Try to include at least 30 minutes of physical activity 5 days each week or at least 150 minutes per week. Regular physical activity promotes overall health-including helping to reduce risk for heart disease and diabetes, promoting mental health, and helping [place] sleep better.    Goal: Do fitness video 3 days per week. Follow doctor recommendations regarding clearance for activity.   Recommend continuing to follow up with [facility] regarding when pool will reopen for individual appointments.
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
## 2019-05-15 ENCOUNTER — Encounter: Payer: Self-pay | Admitting: Registered"

## 2019-05-16 ENCOUNTER — Telehealth: Payer: Self-pay | Admitting: Physician Assistant

## 2019-05-16 ENCOUNTER — Other Ambulatory Visit: Payer: Self-pay

## 2019-05-16 MED ORDER — LITHIUM CARBONATE 300 MG PO CAPS: 900.0000 mg | ORAL_CAPSULE | Freq: Every day | ORAL | 1 refills | Status: DC

## 2019-05-16 MED ORDER — LEVOTHYROXINE SODIUM 150 MCG PO TABS: ORAL_TABLET | ORAL | 1 refills | Status: DC

## 2019-05-16 NOTE — Telephone Encounter (Signed)
Pt would like refill on Lithium, Levothyroxine 90day supply for both. Please send to Kristopher Oppenheim at [address]

## 2019-05-16 NOTE — Telephone Encounter (Signed)
RX's for Lithium and Levothyroxine 90 day supplies submitted.

## 2019-05-24 ENCOUNTER — Other Ambulatory Visit: Payer: Self-pay

## 2019-05-24 ENCOUNTER — Encounter: Payer: Self-pay | Admitting: Addiction (Substance Use Disorder)

## 2019-05-24 ENCOUNTER — Ambulatory Visit (INDEPENDENT_AMBULATORY_CARE_PROVIDER_SITE_OTHER): Payer: 59 | Admitting: Addiction (Substance Use Disorder)

## 2019-05-24 DIAGNOSIS — F4323 Adjustment disorder with mixed anxiety and depressed mood: Secondary | ICD-10-CM | POA: Diagnosis not present

## 2019-05-24 DIAGNOSIS — F319 Bipolar disorder, unspecified: Secondary | ICD-10-CM | POA: Diagnosis not present
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
## 2019-06-15 ENCOUNTER — Ambulatory Visit (INDEPENDENT_AMBULATORY_CARE_PROVIDER_SITE_OTHER): Payer: 59 | Admitting: Addiction (Substance Use Disorder)

## 2019-06-15 ENCOUNTER — Encounter: Payer: Self-pay | Admitting: Addiction (Substance Use Disorder)

## 2019-06-15 ENCOUNTER — Other Ambulatory Visit: Payer: Self-pay

## 2019-06-15 DIAGNOSIS — F319 Bipolar disorder, unspecified: Secondary | ICD-10-CM

## 2019-06-15 NOTE — Progress Notes (Signed)
      [facility] Counselor/Therapist Progress Note  Patient ID: Leah Powers, MRN: [id number],    Date: 06/15/2019  Time Spent: 8:07-9:02 55 mins  Symptoms: less stressed.  Mental Status Exam:  Appearance:   Neat     Behavior:  Sharing and Blaming  Motor:  Normal  Speech/Language:   Pressured  Affect:  Congruent and Tearful  Mood:  anxious  Thought process:  flight of ideas, loose associations and tangential  Thought content:    Rumination and Tangential  Sensory/Perceptual disturbances:    WNL  Orientation:  x4  Attention:  Fair  Concentration:  Good  Memory:  WNL  Fund of knowledge:   Good  Insight:    Good  Judgment:   Good  Impulse Control:  Fair   Risk Assessment: Danger to Self:  No Self-injurious Behavior: No Danger to Others: No Duty to Warn:no Physical Aggression / Violence:No  Access to Firearms a concern: No  Gang Involvement:No   Subjective: Client shared more about her marriage being less stressful as her husband got on mood medicine and was kinder to her. Client processed how him sharing his feelings and love towards her has helped her to feel loved, appreciated, and seen by her husband. Therapist used MI and family systems to discuss the progress in her marriage and validate client's feelings and affirm her strengths/progress. Client making progress towards her goals in session and working on challenging her negative thoughts about her low self-esteem. Therapist used CBT with client to continue to help client continue to have a positive outlook on her life and marriage.   Interventions: Cognitive Behavioral Therapy, Motivational Interviewing and Family Systems  Diagnosis:   ICD-10-CM   1. Bipolar I disorder ([name])  F31.9    Plan of Care: Client to return for biweekly therapy with Sammuel Cooper, therapist, to review again in 6 months. Client to engage in positive self talk and challenging negative internal ruminationsand self talk causing client to be  overly anxious and worriedusing CBT, on daily practice. Client to engage in mindfulness: ie body scans each eveneing to help process and discharge emotional distress & recognize emotions & regulate her mood. Client to continue engaging in medication management to continue taking [place] [facility] medications for her health.  Client to utilize BSP (brainspotting) with therapist to help client regulate their energy in a somatic-felt sense way to decrease hypermanic symptoms to stabilize her moods, relationships, and help her manage her life needs and reduce anxietyby 33% in the next 6 months. Client to prioritize sleep 8+ hours each week night AEB going to bed by 10pm each night. Client participated in the treatment planning of their therapy. Client agreed with the plan and understands what to do if there is a crisis: call 9-1-1 and/or crisis line given by therapist.   Barnie Del, LCSW, LCAS, CCTP, CCS-I, BSP

## 2019-06-24 ENCOUNTER — Encounter: Payer: Self-pay | Admitting: Addiction (Substance Use Disorder)

## 2019-06-24 ENCOUNTER — Ambulatory Visit (INDEPENDENT_AMBULATORY_CARE_PROVIDER_SITE_OTHER): Payer: 59 | Admitting: Addiction (Substance Use Disorder)

## 2019-06-24 ENCOUNTER — Other Ambulatory Visit: Payer: Self-pay

## 2019-06-24 DIAGNOSIS — F319 Bipolar disorder, unspecified: Secondary | ICD-10-CM | POA: Diagnosis not present

## 2019-06-24 NOTE — Progress Notes (Signed)
      [facility] Counselor/Therapist Progress Note  Patient ID: Leah Powers, MRN: [id number],    Date: 06/24/2019  Time Spent: 9:09-10:03 54 mins  Symptoms: just physical issues, but more hopeful.  Mental Status Exam:  Appearance:   Neat     Behavior:  Sharing  Motor:  Normal  Speech/Language:   Garbled and Pressured  Affect:  Congruent and Tearful  Mood:  normal  Thought process:  flight of ideas and loose associations  Thought content:    Rumination and Tangential  Sensory/Perceptual disturbances:    WNL  Orientation:  x4  Attention:  Fair  Concentration:  Good  Memory:  WNL  Fund of knowledge:   Good  Insight:    Good  Judgment:   Good  Impulse Control:  Good   Risk Assessment: Danger to Self:  No Self-injurious Behavior: No Danger to Others: No Duty to Warn:no Physical Aggression / Violence:No  Access to Firearms a concern: No  Gang Involvement:No   Subjective: Client discussed some physical issues with her tendonitis but is motivated and getting things ready to sell their home. Client reporting being more more hopeful about the ability to have her husband follow through on selling their home. Therapist used MI and family systems to support client in processing her marriage dynamic and used open ended questions to understand more of what the client thought. Client and husband making progress. Client reported: im just killing him with kindness. Therapist used CBT to process client's thoughts and positive feelings about her husband's mood improvements due to getting his testosterone shot. Client discussed how thankful she was that her medication for bipolar disorder is helping her monitor her disorder and help her stay stable.  Interventions: Cognitive Behavioral Therapy, Mindfulness Meditation, Motivational Interviewing and Family Systems  Diagnosis:   ICD-10-CM   1. Bipolar I disorder ([place])  F31.9    Plan of Care: Client to return for biweekly therapy with Sammuel Cooper, therapist, to review again in 6 months. Client to engage in positive self talk and challenging negative internal ruminationsand self talk causing client to be overly anxious and worriedusing CBT, on daily practice. Client to engage in mindfulness: ie body scans each eveneing to help process and discharge emotional distress & recognize emotions & regulate her mood. Client to continue engaging in medication management to continue taking [place] [facility] medications for her health.  Client to utilize BSP (brainspotting) with therapist to help client regulate their energy in a somatic-felt sense way to decrease hypermanic symptoms to stabilize her moods, relationships, and help her manage her life needs and reduce anxietyby 33% in the next 6 months. Client to prioritize sleep 8+ hours each week night AEB going to bed by 10pm each night. Client participated in the treatment planning of their therapy. Client agreed with the plan and understands what to do if there is a crisis: call 9-1-1 and/or crisis line given by therapist.   Leah Del, LCSW, LCAS, CCTP, CCS-I, BSP

## 2019-06-30 ENCOUNTER — Ambulatory Visit: Payer: 59 | Admitting: Registered"

## 2019-06-30 ENCOUNTER — Ambulatory Visit: Payer: 59 | Admitting: Addiction (Substance Use Disorder)

## 2019-07-07 ENCOUNTER — Ambulatory Visit: Payer: 59 | Admitting: Addiction (Substance Use Disorder)

## 2019-07-11 ENCOUNTER — Encounter: Payer: 59 | Attending: Family Medicine | Admitting: Registered"

## 2019-07-11 ENCOUNTER — Other Ambulatory Visit: Payer: Self-pay

## 2019-07-11 DIAGNOSIS — E119 Type 2 diabetes mellitus without complications: Secondary | ICD-10-CM | POA: Diagnosis not present

## 2019-07-11 NOTE — Patient Instructions (Addendum)
Instructions/Goals:  Make sure to get in three meals per day. Try to have balanced meals like the My Plate example (see handout). Include lean proteins, vegetables, fruits, and whole grains at meals.   Have 2-3 carbohydrate choices per meal. Continue eating every 3-5 hours. Include a balance of food groups at each meal. -Good job reading labels! Keep it up!  Try to limit soda intake. If having soda as snack, add protein to help balance.  Recommend adding water to frozen juice cubes to reduce sugar. Also recommend having some protein to balance.   Continue getting in at least 4 bottles water day (64 oz or more)  If having symptoms of low blood sugar (shakiness, feeling faint, confused, etc (see booklet) If 70 or less treat with 15/15 rule on page 13  Continue having hgbA1c checked regularly (every 2-3 months) with your doctor to keep a check on blood sugar.   Make physical activity a part of your week. Try to include at least 30 minutes of physical activity 5 days each week or at least 150 minutes per week. Regular physical activity promotes overall health-including helping to reduce risk for heart disease and diabetes, promoting mental health, and helping [place] sleep better.   Goal: Include activity at least 3 days per week. Great job working in more activity! Great plan to try out [facility] pool as well.

## 2019-07-11 NOTE — Progress Notes (Signed)
Diabetes Self-Management Education  Visit Type:    Appt. Start Time: 1129 Appt. End Time: 1215  07/11/2019  [name], identified by name and date of birth, is a 59 y.o. female with a diagnosis of Diabetes:  .   ASSESSMENT  Weight 258 lb 8 oz (117.3 kg), last menstrual period 07/30/2013. Body mass index is 38.17 kg/m.   Nutrition Follow-Up: Pt present for appointment. Pt reports she is still not checking blood sugar at home due to fear checking with needle. Reports she will get updated A1c in June. Pt reports she has increased water intake-reports getting in ~96 oz per day. Reports she has been drinking a cran-grape 100% juice frozen in ice cubes like a slushy and will have some [employer] citrus soda every other day, about 4 oz at a time. Pt reports other non-caffeinated drinks usually cause diarrhea but this soda does not. Pt also reports sometimes drinks 2% milk, does not really like lower fat milks. Reports having 1 cup milk daily, sometimes 2.   Pt reports currently eating 3 meals and may have 1-2 snacks. Typical pattern: breakfast ~8-9 AM, snack, lunch ~2PM, dinner 530-7 PM. Reports her eating schedule has changed some due to change in husband's work schedule.   Pt reports checking labels and choosing a lower sugar cereal. Pt brought labels for multiple foods she has been eating include lower fat/carb frozen meals (Lean Cuisine and Healthy Choice) and a Kristopher Oppenheim version of Crispix. Pt reports using [place] dressing instead of ranch.   Pt denies any symptoms of low blood sugar since last appointment. Reports improvement in bowel movements and overall how she feels. Pt also reports her blood pressure has been good lately.   24-Hour Recall: Breakfast: Crispix cereal with 2% milk;  4 oz Cran-grape juice Snk: water  Lunch: (From restaurant) white meat [place] with light gravy, green beans with bacon pieces, mashed potatoes, 1 piece wheat toast, water  Snk (PM): 8 oz watered  down sweet tea, microwave light popcorn Dinner:( PM) [employer] ~6 inch multi-grain sub, tuna salad, few pickles, few olives, red onions, few tomatoes, mayo, tortilla chips, plain ruffles chips, water  Snk (PM): None reported.  Beverages: cran-grape juice, low sugar sweet tea, water  Food Allergies/Intolerances: Pt reports that she cannot tolerate whole corn. Reports milled corn is ok such as that in corn chips or corn bread. Also reports that she tolerates popcorn. Pt reports she is allergic to bleu cheese.   Physical Activity: [employer], working in yard, working with sister at [employer]. Reports doing videos at least 2 days weekly. Pt plans to schedule time for her and husband to go swimming at [facility].   Noted Lab Values: 03/21/19:  Hgb A1c: 7.4 Triglycerides: 209 LDL: 130  02/15/18:  Hgb A1c: 6.6 Triglycerides: 156 LDL Cholesterol: 145  Individualized Plan for Diabetes Self-Management Training:   Learning Objective:  Patient will have a greater understanding of diabetes self-management. Patient education plan is to attend individual and/or group sessions per assessed needs and concerns.   Plan:  Praised pt for including more physical activity and working to make more beneficial choices. Discussed continuing to work to eat every 3-5 hours consistently. Discussed ways to decrease sugary drinks and having protein with if pt does consume them to help balance blood sugar. Discussed foods pt brought labels for-frozen meals provided good protein, fiber, carbohydrates within range, but one was very high in sodium-discussed continuing to be mindful of the sodium. Pt  reports blood pressure has been on the low side lately. Encouraged pt to schedule time for swimming at [facility] to add additional physical activity. Pt appeared agreeable to information/goals discussed.     Patient Instructions  Instructions/Goals:  Make sure to get in three meals per day. Try to have  balanced meals like the My Plate example (see handout). Include lean proteins, vegetables, fruits, and whole grains at meals.   Have 2-3 carbohydrate choices per meal. Continue eating every 3-5 hours. Include a balance of food groups at each meal. -Good job reading labels! Keep it up!  Try to limit soda intake. If having soda as snack, add protein to help balance.  Recommend adding water to frozen juice cubes to reduce sugar. Also recommend having some protein to balance.   Continue getting in at least 4 bottles water day (64 oz or more)  If having symptoms of low blood sugar (shakiness, feeling faint, confused, etc (see booklet) If 70 or less treat with 15/15 rule on page 13  Continue having hgbA1c checked regularly (every 2-3 months) with your doctor to keep a check on blood sugar.   Make physical activity a part of your week. Try to include at least 30 minutes of physical activity 5 days each week or at least 150 minutes per week. Regular physical activity promotes overall health-including helping to reduce risk for heart disease and diabetes, promoting mental health, and helping [place] sleep better.   Goal: Include activity at least 3 days per week. Great job working in more activity! Great plan to try out [facility] pool as well.    Instructions/Goals:  Make sure to get in three meals per day. Try to have balanced meals like the My Plate example (see handout). Include lean proteins, vegetables, fruits, and whole grains at meals.   Have 2-3 carbohydrate choices per meal. Continue eating every 3-5 hours. Include a balance of food groups at each meal. -Good job reading labels! Keep it up!  Try to limit soda intake. If having soda as snack, add protein to help balance.  Recommend adding water to frozen juice cubes to reduce sugar. Also recommend having some protein to balance.   Continue getting in at least 4 bottles water day (64 oz or more)  If having symptoms of low blood sugar  (shakiness, feeling faint, confused, etc (see booklet) If 70 or less treat with 15/15 rule on page 13  Continue having hgbA1c checked regularly (every 2-3 months) with your doctor to keep a check on blood sugar.   Make physical activity a part of your week. Try to include at least 30 minutes of physical activity 5 days each week or at least 150 minutes per week. Regular physical activity promotes overall health-including helping to reduce risk for heart disease and diabetes, promoting mental health, and helping [place] sleep better.   Goal: Include activity at least 3 days per week. Great job working in more activity! Great plan to try out [facility] pool as well.   Expected Outcomes:   Expect Positive Outcomes  Education material provided: None.   If problems or questions, patient to contact team via:  Phone, Email and My Chart   Future DSME appointment:   6 weeks.

## 2019-07-14 ENCOUNTER — Ambulatory Visit: Payer: 59 | Admitting: Addiction (Substance Use Disorder)

## 2019-07-18 ENCOUNTER — Encounter: Payer: Self-pay | Admitting: Physician Assistant

## 2019-07-18 ENCOUNTER — Other Ambulatory Visit: Payer: Self-pay

## 2019-07-18 ENCOUNTER — Ambulatory Visit (INDEPENDENT_AMBULATORY_CARE_PROVIDER_SITE_OTHER): Payer: 59 | Admitting: Addiction (Substance Use Disorder)

## 2019-07-18 ENCOUNTER — Ambulatory Visit: Payer: BC Managed Care – PPO | Admitting: Physician Assistant

## 2019-07-18 DIAGNOSIS — F4323 Adjustment disorder with mixed anxiety and depressed mood: Secondary | ICD-10-CM | POA: Diagnosis not present

## 2019-07-18 DIAGNOSIS — E032 Hypothyroidism due to medicaments and other exogenous substances: Secondary | ICD-10-CM

## 2019-07-18 DIAGNOSIS — F319 Bipolar disorder, unspecified: Secondary | ICD-10-CM

## 2019-07-18 DIAGNOSIS — Z79899 Other long term (current) drug therapy: Secondary | ICD-10-CM | POA: Diagnosis not present

## 2019-07-18 NOTE — Progress Notes (Signed)
      [facility] Counselor/Therapist Progress Note  Patient ID: Leah Powers, MRN: [id number],    Date: 07/18/2019  Time Spent: 9:04-9:59 55 mins  Symptoms: not feeling appreciated.   Mental Status Exam:  Appearance:   Casual     Behavior:  Sharing  Motor:  Normal  Speech/Language:   Garbled and Pressured  Affect:  Congruent and Tearful  Mood:  irritable and sad  Thought process:  flight of ideas and loose associations  Thought content:    Rumination and Tangential  Sensory/Perceptual disturbances:    WNL  Orientation:  x4  Attention:  Fair  Concentration:  Good  Memory:  WNL  Fund of knowledge:   Good  Insight:    Good  Judgment:   Good  Impulse Control:  Good   Risk Assessment: Danger to Self:  No Self-injurious Behavior: No Danger to Others: No Duty to Warn:no Physical Aggression / Violence:No  Access to Firearms a concern: No  Gang Involvement:No   Subjective: Client processed ongoing issues with her husband and quarrels they have over their house to sell and housework/medical errands. Client very tearful about being called a child or quarrelsome by her husband and shared not feeling appreciated. Therapist used CBT and MI to validate client's painful experience and to help client understand more about how those comments make him feel/think and hurt her lower self-esteem that comes from not being able to work full time with her bipolar 1 complications. Therapist discussed family dynamics with client and helped client continue to be supported and assertiveness to help client consider how to engage in further discussions with her husband.   Interventions: Cognitive Behavioral Therapy, Assertiveness/Communication, Motivational Interviewing and Family Systems  Diagnosis:   ICD-10-CM   1. Bipolar I disorder ([place])  F31.9   2. Adjustment disorder with mixed anxiety and depressed mood  F43.23    Plan of Care: Client to return for biweekly therapy with Sammuel Cooper,  therapist, to review again in 6 months. Client to engage in positive self talk and challenging negative internal ruminationsand self talk causing client to be overly anxious and worriedusing CBT, on daily practice. Client to engage in mindfulness: ie body scans each eveneing to help process and discharge emotional distress & recognize emotions & regulate her mood. Client to continue engaging in medication management to continue taking [place] [facility] medications for her health.  Client to utilize BSP (brainspotting) with therapist to help client regulate their energy in a somatic-felt sense way to decrease hypermanic symptoms to stabilize her moods, relationships, and help her manage her life needs and reduce anxietyby 33% in the next 6 months. Client to prioritize sleep 8+ hours each week night AEB going to bed by 10pm each night. Client participated in the treatment planning of their therapy. Client agreed with the plan and understands what to do if there is a crisis: call 9-1-1 and/or crisis line given by therapist.   Barnie Del, LCSW, LCAS, CCTP, CCS-I, BSP
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
## 2019-07-28 ENCOUNTER — Ambulatory Visit: Payer: 59 | Admitting: Addiction (Substance Use Disorder)

## 2019-08-01 ENCOUNTER — Ambulatory Visit: Payer: 59 | Admitting: Addiction (Substance Use Disorder)

## 2019-08-08 ENCOUNTER — Encounter: Payer: Self-pay | Admitting: Addiction (Substance Use Disorder)

## 2019-08-08 ENCOUNTER — Other Ambulatory Visit: Payer: Self-pay

## 2019-08-08 ENCOUNTER — Ambulatory Visit (INDEPENDENT_AMBULATORY_CARE_PROVIDER_SITE_OTHER): Payer: 59 | Admitting: Addiction (Substance Use Disorder)

## 2019-08-08 DIAGNOSIS — F319 Bipolar disorder, unspecified: Secondary | ICD-10-CM | POA: Diagnosis not present

## 2019-08-08 NOTE — Progress Notes (Signed)
      [facility] Counselor/Therapist Progress Note  Patient ID: Leah Powers, MRN: [id number],    Date: 08/08/2019  Time Spent: 11:02-11:55 53 mins  Symptoms: a little scattered, but excited about a trip.   Mental Status Exam:  Appearance:   Casual and Well Groomed     Behavior:  Sharing  Motor:  Normal  Speech/Language:   Garbled and Pressured  Affect:  Appropriate, Congruent and Full Range  Mood:  normal  Thought process:  flight of ideas  Thought content:    Rumination and Tangential  Sensory/Perceptual disturbances:    WNL  Orientation:  x4  Attention:  Fair  Concentration:  Good  Memory:  WNL  Fund of knowledge:   Good  Insight:    Good  Judgment:   Good  Impulse Control:  Good   Risk Assessment: Danger to Self:  No Self-injurious Behavior: No Danger to Others: No Duty to Warn:no Physical Aggression / Violence:No  Access to Firearms a concern: No  Gang Involvement:No   Subjective: Client reported feeling a a little scattered, but excited about a vacation trip her and her husband were going to go on because he finally got some time off from work. Therapist used MI and family systems to support and encourage client to enjoy time to play/relax with her husband and take time to notice their improvement in marital issues. Client then discussed recent improvements medically of hers and her husbands'. Client processed the pain shes been through with certain seasons of life that were so trying due to life circumstances along with her MH and medical issues. Client concerned that her MH medications caused her thyroid issues and processed grief of having to stay on medications most of her life. Therapist used grief therapy and CBT with client and helped client make sense out of her feelings and thoughts about her medications and her need to continue caring for herself medically. Therapist assessed for safety and stability and client denied SI/HI/AVH.   Interventions: Cognitive  Behavioral Therapy, Motivational Interviewing, Grief Therapy and Family Systems  Diagnosis:   ICD-10-CM   1. Bipolar I disorder ([place])  F31.9    Plan of Care: Client to return for biweekly therapy with Sammuel Cooper, therapist, to review again in 6 months. Client to engage in positive self talk and challenging negative internal ruminationsand self talk causing client to be overly anxious and worriedusing CBT, on daily practice. Client to engage in mindfulness: ie body scans each eveneing to help process and discharge emotional distress & recognize emotions & regulate her mood. Client to continue engaging in medication management to continue taking [facility] medications for her health.  Client to utilize BSP (brainspotting) with therapist to help client regulate their energy in a somatic-felt sense way to decrease hypermanic symptoms to stabilize her moods, relationships, and help her manage her life needs and reduce anxietyby 33% in the next 6 months. Client to prioritize sleep 8+ hours each week night AEB going to bed by 10pm each night. Client participated in the treatment planning of their therapy. Client agreed with the plan and understands what to do if there is a crisis: call 9-1-1 and/or crisis line given by therapist.   Barnie Del, LCSW, LCAS, CCTP, CCS-I, BSP

## 2019-08-15 ENCOUNTER — Other Ambulatory Visit: Payer: Self-pay | Admitting: Dermatology

## 2019-08-18 ENCOUNTER — Ambulatory Visit: Payer: 59 | Admitting: Addiction (Substance Use Disorder)

## 2019-08-29 ENCOUNTER — Ambulatory Visit: Payer: 59 | Admitting: Registered"

## 2019-09-01 ENCOUNTER — Encounter: Payer: Self-pay | Admitting: Addiction (Substance Use Disorder)

## 2019-09-01 ENCOUNTER — Other Ambulatory Visit: Payer: Self-pay

## 2019-09-01 ENCOUNTER — Ambulatory Visit (INDEPENDENT_AMBULATORY_CARE_PROVIDER_SITE_OTHER): Payer: 59 | Admitting: Addiction (Substance Use Disorder)

## 2019-09-01 DIAGNOSIS — F319 Bipolar disorder, unspecified: Secondary | ICD-10-CM

## 2019-09-01 DIAGNOSIS — F4323 Adjustment disorder with mixed anxiety and depressed mood: Secondary | ICD-10-CM

## 2019-09-01 NOTE — Progress Notes (Signed)
      [facility] Counselor/Therapist Progress Note  Patient ID: Leah Powers, MRN: [id number],    Date: 09/01/2019  Time Spent: 9:13-10:00 47 mins   Symptoms: absent minded, frantic, irritated with husband.     Mental Status Exam:  Appearance:   Neat and Well Groomed     Behavior:  Sharing  Motor:  Normal  Speech/Language:   Clear and Coherent and Pressured  Affect:  Appropriate, Congruent and Full Range  Mood:  normal  Thought process:  normal  Thought content:    Rumination and Tangential  Sensory/Perceptual disturbances:    WNL  Orientation:  x4  Attention:  Fair  Concentration:  Good  Memory:  WNL  Fund of knowledge:   Good  Insight:    Good  Judgment:   Good  Impulse Control:  Good   Risk Assessment: Danger to Self:  No Self-injurious Behavior: No Danger to Others: No Duty to Warn:no Physical Aggression / Violence:No  Access to Firearms a concern: No  Gang Involvement:No   Subjective: Client reported being rushed this morning and running late, apologized. Client struggles with feeling frantic when she stressed about something or cant find something. Client also gets irritated with having to find her husband's things that he looses too. Client described the frustration with having to do all the home tasks on her own and not getting help from her husband. Therapist used MI and family systems with client to support client in processing her stressors at home with her husband. Client shared her thoughts that get her irritated and keep her resentful. Therapist used CBT with client to help her consider how her thoughts are working up her anxiety/inner frustrations, while also challenging any negative talk that is in her control. Therapist also suggested surrendering the things she cant control, and client making progress in considering how to do that. Therapist assessed for safety and stability and client denied SI/HI/AVH and denied increased mania or  depression.  Interventions: Cognitive Behavioral Therapy, Motivational Interviewing and Family Systems  Diagnosis:   ICD-10-CM   1. Bipolar I disorder ([place])  F31.9   2. Adjustment disorder with mixed anxiety and depressed mood  F43.23    Plan of Care: Client to return for biweekly therapy with Sammuel Cooper, therapist, to review again in 6 months. Client to engage in positive self talk and challenging negative internal ruminationsand self talk causing client to be overly anxious and worriedusing CBT, on daily practice. Client to engage in mindfulness: ie body scans each eveneing to help process and discharge emotional distress & recognize emotions & regulate her mood. Client to continue engaging in medication management to continue taking [facility] medications for her health.  Client to utilize BSP (brainspotting) with therapist to help client regulate their energy in a somatic-felt sense way to decrease hypermanic symptoms to stabilize her moods, relationships, and help her manage her life needs and reduce anxietyby 33% in the next 6 months. Client to prioritize sleep 8+ hours each week night AEB going to bed by 10pm each night. Client participated in the treatment planning of their therapy. Client agreed with the plan and understands what to do if there is a crisis: call 9-1-1 and/or crisis line given by therapist.   Barnie Del, LCSW, LCAS, CCTP, CCS-I, BSP

## 2019-09-05 ENCOUNTER — Ambulatory Visit
Admission: RE | Admit: 2019-09-05 | Discharge: 2019-09-05 | Disposition: A | Discharge status: Home / Self Care | Payer: 59 | Source: Ambulatory Visit | Attending: Family Medicine | Admitting: Family Medicine

## 2019-09-05 ENCOUNTER — Other Ambulatory Visit: Payer: Self-pay

## 2019-09-05 ENCOUNTER — Other Ambulatory Visit: Payer: Self-pay | Admitting: Family Medicine

## 2019-09-05 DIAGNOSIS — Z1231 Encounter for screening mammogram for malignant neoplasm of breast: Secondary | ICD-10-CM

## 2019-09-06 ENCOUNTER — Other Ambulatory Visit: Payer: Self-pay | Admitting: Family Medicine

## 2019-09-06 DIAGNOSIS — E2839 Other primary ovarian failure: Secondary | ICD-10-CM

## 2019-09-07 ENCOUNTER — Ambulatory Visit
Admission: RE | Admit: 2019-09-07 | Discharge: 2019-09-07 | Disposition: A | Discharge status: Home / Self Care | Payer: 59 | Source: Ambulatory Visit | Attending: Family Medicine | Admitting: Family Medicine

## 2019-09-07 ENCOUNTER — Other Ambulatory Visit: Payer: Self-pay

## 2019-09-07 DIAGNOSIS — E2839 Other primary ovarian failure: Secondary | ICD-10-CM

## 2019-09-12 ENCOUNTER — Ambulatory Visit: Payer: 59 | Admitting: Addiction (Substance Use Disorder)

## 2019-09-13 LAB — LITHIUM LEVEL: Lithium Lvl: 0.5 mmol/L (ref 0.5–1.2)

## 2019-09-13 NOTE — Progress Notes (Signed)
As long as she is still feeling well mood wise, the lithium level is okay.  It is a little bit low normal but again it is fine.  If she is having any depression symptoms then we need to increase the dose.

## 2019-09-20 ENCOUNTER — Ambulatory Visit: Payer: 59 | Admitting: Addiction (Substance Use Disorder)

## 2019-10-04 ENCOUNTER — Ambulatory Visit (INDEPENDENT_AMBULATORY_CARE_PROVIDER_SITE_OTHER): Payer: 59 | Admitting: Addiction (Substance Use Disorder)

## 2019-10-04 ENCOUNTER — Encounter: Payer: Self-pay | Admitting: Addiction (Substance Use Disorder)

## 2019-10-04 ENCOUNTER — Other Ambulatory Visit: Payer: Self-pay

## 2019-10-04 DIAGNOSIS — F319 Bipolar disorder, unspecified: Secondary | ICD-10-CM

## 2019-10-04 DIAGNOSIS — F4323 Adjustment disorder with mixed anxiety and depressed mood: Secondary | ICD-10-CM

## 2019-10-04 NOTE — Progress Notes (Signed)
      [facility] Counselor/Therapist Progress Note  Patient ID: Leah Powers, MRN: [id number],    Date: 10/04/2019  Time Spent: 54 mins  Symptoms: frustrated with husband's irritability.     Mental Status Exam:  Appearance:   Casual and Well Groomed     Behavior:  Sharing  Motor:  Normal  Speech/Language:   Clear and Coherent and Pressured  Affect:  Appropriate, Congruent and Full Range  Mood:  normal  Thought process:  normal  Thought content:    Rumination and Tangential  Sensory/Perceptual disturbances:    WNL  Orientation:  x4  Attention:  Fair  Concentration:  Good  Memory:  WNL  Fund of knowledge:   Good  Insight:    Good  Judgment:   Good  Impulse Control:  Good   Risk Assessment: Danger to Self:  No Self-injurious Behavior: No Danger to Others: No Duty to Warn:no Physical Aggression / Violence:No  Access to Firearms a concern: No  Gang Involvement:No   Subjective: Client shared about ongoing marriage issues with her husband's irritability with her. Client described the pain of feeling ignored by her husband's inconsiderable actions. Therapist used MI & CBT with client to validate her frustration and sadness, and help client identify her thoughts that make her frustrated and what changes she can make as far as marriage expectations go. Therapist used roleplaying with client to help her think about ways to interact differently with her husband to avoid conflict and process their feelings. Therapist inquired about client's stability and client denied SI/HI/AVH or mania. Client reported more stability with her MH and therefore, more resiliency to deal with conflicts with her husband.   Interventions: Cognitive Behavioral Therapy, Roleplay and Motivational Interviewing  Diagnosis:   ICD-10-CM   1. Bipolar I disorder ([place])  F31.9   2. Adjustment disorder with mixed anxiety and depressed mood  F43.23    Plan of Care: Client to return for biweekly therapy with Sammuel Cooper, therapist, to review again in 6 months. Client to engage in positive self talk and challenging negative internal ruminationsand self talk causing client to be overly anxious and worriedusing CBT, on daily practice. Client to engage in mindfulness: ie body scans each eveneing to help process and discharge emotional distress & recognize emotions & regulate her mood. Client to continue engaging in medication management to continue taking [facility] medications for her health.  Client to utilize BSP (brainspotting) with therapist to help client regulate their energy in a somatic-felt sense way to decrease hypermanic symptoms to stabilize her moods, relationships, and help her manage her life needs and reduce anxietyby 33% in the next 6 months. Client to prioritize sleep 8+ hours each week night AEB going to bed by 10pm each night. Client participated in the treatment planning of their therapy. Client agreed with the plan and understands what to do if there is a crisis: call 9-1-1 and/or crisis line given by therapist.   Barnie Del, LCSW, LCAS, CCTP, CCS-I, BSP

## 2019-10-20 ENCOUNTER — Ambulatory Visit: Payer: 59 | Admitting: Addiction (Substance Use Disorder)

## 2019-10-25 ENCOUNTER — Ambulatory Visit: Payer: 59 | Admitting: Addiction (Substance Use Disorder)

## 2019-10-26 ENCOUNTER — Ambulatory Visit (INDEPENDENT_AMBULATORY_CARE_PROVIDER_SITE_OTHER): Payer: 59 | Admitting: Vascular Surgery

## 2019-10-26 ENCOUNTER — Other Ambulatory Visit: Payer: Self-pay

## 2019-10-26 ENCOUNTER — Encounter: Payer: Self-pay | Admitting: Vascular Surgery

## 2019-10-26 VITALS — BP 126/72 | HR 75 | Temp 97.1°F | Resp 18 | Ht 67.5 in | Wt 254.5 lb

## 2019-10-26 DIAGNOSIS — I83812 Varicose veins of left lower extremities with pain: Secondary | ICD-10-CM

## 2019-10-26 DIAGNOSIS — M7989 Other specified soft tissue disorders: Secondary | ICD-10-CM

## 2019-10-26 NOTE — Progress Notes (Signed)
Referring Physician: Christa See NP  Patient name: Leah Powers MRN: [id number] DOB: 23-May-1960 Sex: female  REASON FOR CONSULT: Symptomatic varicose veins with pain  HPI: Leah Powers is a 59 y.o. female, with an 24-month history of burning pain and stinging over a cluster of varicosities in the left lateral thigh.  He has become irritated when taking a shower or if anything brushes against them.  She has worn some knee-high compression stockings intermittently in the past with minimal relief of symptoms.  She has no prior history of DVT.  She has no family history of varicose veins.  She does not really describe any leg swelling.  Other medical problems include arthritis, diabetes, high blood pressure, bipolar disorder.  All of these are currently stable.  Past Medical History:  Diagnosis Date  . Arthritis    L hand, R ankle  (10/08/2016)  . Bipolar 1 disorder ([place])   . Diabetes mellitus without complication ([place])   . Fibromyalgia    told that the breakout on the upper back , could be over active nerves , also treated with cream & Gabapentin- [facility] [place]   . History of kidney stones    spontaneous passing  . Hypertension   . Hypothyroid   . Pneumonia ~ 1967; 07/2016  . Pyelonephritis 1997   treated with IV antibiotic    Past Surgical History:  Procedure Laterality Date  . BLADDER INSTILLATION  X 2  . CHALAZION EXCISION Bilateral    R- eye X2 , L eye- X1  . FOOT ARTHRODESIS Right 10/08/2016   Procedure: RIGHT TRIPLE ARTHRODESIS;  Surgeon: Newt Minion, MD;  Location: [facility];  Service: Orthopedics;  Laterality: Right;  . FOOT ARTHRODESIS, TRIPLE Right 10/08/2016   ankle  . FRENULECTOMY, LINGUAL  1970s  . LAPAROSCOPIC CHOLECYSTECTOMY  1996  . PALATE SURGERY  1970s   tissue removed from upper palate as a child   . URETHRAL DILATION      Family History  Problem Relation Age of Onset  . Breast cancer Maternal Aunt 65  . Breast cancer Maternal  Grandmother 35  . Sleep apnea Father   . Hypertension Father   . Diabetes Sister   . Hypertension Sister   . Asthma Sister   . Cancer Paternal Grandfather     SOCIAL HISTORY: Social History   Socioeconomic History  . Marital status: Married    Spouse name: Not on file  . Number of children: Not on file  . Years of education: Not on file  . Highest education level: Not on file  Occupational History  . Not on file  Tobacco Use  . Smoking status: Never Smoker  . Smokeless tobacco: Never Used  Vaping Use  . Vaping Use: Never used  Substance and Sexual Activity  . Alcohol use: No  . Drug use: No  . Sexual activity: Not Currently  Other Topics Concern  . Not on file  Social History Narrative  . Not on file   Social Determinants of Health   Financial Resource Strain:   . Difficulty of Paying Living Expenses:   Food Insecurity:   . Worried About Charity fundraiser in the Last Year:   . Arboriculturist in the Last Year:   Transportation Needs:   . Film/video editor (Medical):   Marland Kitchen Lack of Transportation (Non-Medical):   Physical Activity:   . Days of Exercise per Week:   . Minutes of  Exercise per Session:   Stress:   . Feeling of Stress :   Social Connections:   . Frequency of Communication with Friends and Family:   . Frequency of Social Gatherings with Friends and Family:   . Attends Religious Services:   . Active Member of Clubs or Organizations:   . Attends Archivist Meetings:   Marland Kitchen Marital Status:   Intimate Partner Violence:   . Fear of Current or Ex-Partner:   . Emotionally Abused:   Marland Kitchen Physically Abused:   . Sexually Abused:     Allergies  Allergen Reactions  . Eucalyptus Oil Shortness Of Breath  . Haldol [Haloperidol Decanoate] Other (See Comments)    Drooling, weaving in floor, parkinson's syndrome (couldn't eat or talk) , pt was hospitalized   . Molds & Smuts Shortness Of Breath and Itching  . Other Anaphylaxis, Shortness Of Breath,  Itching and Swelling    Bleu cheese   . Penicillins Anaphylaxis and Rash    Pt was 59 years old  Has patient had a PCN reaction causing immediate rash, facial/tongue/throat swelling, SOB or lightheadedness with hypotension: Yes Has patient had a PCN reaction causing severe rash involving mucus membranes or skin necrosis: Unknown Has patient had a PCN reaction that required hospitalization: No Has patient had a PCN reaction occurring within the last 10 years: No If all of the above answers are "NO", then may proceed with Cephalosporin use.   . Bee Venom Other (See Comments)    Yellow jackets and wasps-stung by 60, MD told pt that she wouldn't have resistance if stung again   . Corn-Containing Products Nausea And Vomiting and Other (See Comments)    Stomach distress   . Demeclocycline Other (See Comments)  . Dolobid [Diflunisal] Other (See Comments)    Upset stomach  . Metformin And Related   . Vicodin [Hydrocodone-Acetaminophen] Diarrhea and Other (See Comments)    Upset stomach  . Doxycycline Rash  . Erythromycin Rash  . Keflex [Cephalexin] Rash  . Macrodantin Rash  . Motrin [Ibuprofen] Palpitations  . Septra [Bactrim] Rash  . Tetracyclines & Related Rash  . Tofranil-Pm Rash    Current Outpatient Medications  Medication Sig Dispense Refill  . Carboxymethylcellulose Sodium (ARTIFICIAL TEARS OP) Apply to eye.    . cetirizine (ZYRTEC) 5 MG tablet Take 5 mg by mouth 2 (two) times daily as needed for allergies.     . diphenhydrAMINE HCl (BENADRYL ALLERGY PO) Take by mouth as needed.    . empagliflozin (JARDIANCE) 10 MG TABS tablet Take 10 mg by mouth daily.    Marland Kitchen gabapentin (NEURONTIN) 300 MG capsule Take 300 mg by mouth 3 (three) times daily.    . hydrochlorothiazide (HYDRODIURIL) 25 MG tablet TAKE ONE TABLET BY MOUTH ONCE DAILY (Patient taking differently: Take 25 mg by mouth daily. ) 90 tablet 0  . levothyroxine (SYNTHROID) 150 MCG tablet TAKE 1 TABLET BY MOUTH ONCE DAILY BEFORE  BREAKFST 90 tablet 1  . lithium carbonate 300 MG capsule Take 3 capsules (900 mg total) by mouth daily. 270 capsule 1  . losartan (COZAAR) 50 MG tablet Take 50 mg by mouth daily.     . mupirocin ointment (BACTROBAN) 2 % APPLY TO AFFECTED AREA(S) DAILY TO TWO TIMES A DAY 22 g 2  . naproxen sodium (ALEVE) 220 MG tablet     . rosuvastatin (CRESTOR) 20 MG tablet Take 20 mg by mouth daily.    Marland Kitchen triamcinolone cream (KENALOG) 0.1 % APPLY TO AFFECTED  AREA(S) DAILY 30 g 2  . losartan (COZAAR) 50 MG tablet Take by mouth.    . predniSONE (DELTASONE) 10 MG tablet Take 2 tablets by mouth daily with breakfast (Patient not taking: Reported on 01/17/2019) 60 tablet 0  . predniSONE (DELTASONE) 10 MG tablet Take 1 by mouth daily with food for 1 week. Then 1 tab every other day . (Patient not taking: Reported on 07/18/2019) 30 tablet 0  . traMADol (ULTRAM) 50 MG tablet TAKE 1 TABLET BY MOUTH EVERY 12 HOURS AS NEEDED (Patient not taking: Reported on 10/27/2018) 30 tablet 0   No current facility-administered medications for this visit.    ROS:   General:  No weight loss, Fever, chills  HEENT: No recent headaches, no nasal bleeding, no visual changes, no sore throat  Neurologic: No dizziness, blackouts, seizures. No recent symptoms of stroke or mini- stroke. No recent episodes of slurred speech, or temporary blindness.  Cardiac: No recent episodes of chest pain/pressure, no shortness of breath at rest.  No shortness of breath with exertion.  Denies history of atrial fibrillation or irregular heartbeat  Vascular: No history of rest pain in feet.  No history of claudication.  No history of non-healing ulcer, No history of DVT   Pulmonary: No home oxygen, no productive cough, no hemoptysis,  No asthma or wheezing  Musculoskeletal:  [X]  Arthritis, [ ]  Low back pain,  [X]  Joint pain  Hematologic:No history of hypercoagulable state.  No history of easy bleeding.  No history of anemia  Gastrointestinal: No  hematochezia or melena,  No gastroesophageal reflux, no trouble swallowing  Urinary: [ ]  chronic Kidney disease, [ ]  on HD - [ ]  MWF or [ ]  TTHS, [ ]  Burning with urination, [ ]  Frequent urination, [ ]  Difficulty urinating;   Skin: No rashes  Psychological: No history of anxiety,  No history of depression   Physical Examination  Vitals:   10/26/19 1306  BP: 126/72  Pulse: 75  Resp: 18  Temp: (!) 97.1 F (36.2 C)  TempSrc: Temporal  SpO2: 96%  Weight: 254 lb 8 oz (115.4 kg)  Height: 5' 7.5" (1.715 m)    Body mass index is 39.27 kg/m.  General:  Alert and oriented, no acute distress HEENT: Normal Cardiac: Regular Rate and Rhythm Skin: No rash, cluster of varicosities left lateral thigh reticular type variety a few scattered spider type veins diffusely both legs Extremity Pulses:  2+ dorsalis pedis, posterior tibial pulses bilaterally Musculoskeletal: No deformity or edema  Neurologic: Upper and lower extremity motor 5/5 and symmetric  DATA:  I performed a preliminary SonoSite ultrasound of her leg today to look at vein diameter.  The vein was about 3-1/2 mm in diameter at the knee to thigh level and then dilates up to 6 mm about 5 cm from the saphenofemoral junction.  ASSESSMENT: Symptomatic varicose veins with pain   PLAN: Patient was given a prescription today for lower extremity thigh-high compression stockings 20 to 30 mmHg to see if we can improve her symptoms.  Will follow up in 3 months time with a formal reflux venous ultrasound to determine whether or not she would be a candidate for laser ablation or whether or not her symptoms have improved with conservative measures with elevation and compression.   Ruta Hinds, MD [facility] Office: [phone]

## 2019-10-31 ENCOUNTER — Other Ambulatory Visit: Payer: Self-pay | Admitting: *Deleted

## 2019-10-31 DIAGNOSIS — I83812 Varicose veins of left lower extremities with pain: Secondary | ICD-10-CM

## 2019-11-07 ENCOUNTER — Other Ambulatory Visit: Payer: Self-pay | Admitting: Physician Assistant

## 2019-11-08 ENCOUNTER — Other Ambulatory Visit: Payer: Self-pay | Admitting: Physician Assistant

## 2019-11-08 ENCOUNTER — Ambulatory Visit: Payer: 59 | Admitting: Addiction (Substance Use Disorder)

## 2019-12-08 ENCOUNTER — Other Ambulatory Visit: Payer: Self-pay

## 2019-12-08 ENCOUNTER — Ambulatory Visit (INDEPENDENT_AMBULATORY_CARE_PROVIDER_SITE_OTHER): Payer: 59 | Admitting: Physician Assistant

## 2019-12-08 ENCOUNTER — Encounter: Payer: Self-pay | Admitting: Physician Assistant

## 2019-12-08 DIAGNOSIS — D2371 Other benign neoplasm of skin of right lower limb, including hip: Secondary | ICD-10-CM | POA: Diagnosis not present

## 2019-12-08 DIAGNOSIS — R202 Paresthesia of skin: Secondary | ICD-10-CM | POA: Diagnosis not present

## 2019-12-08 DIAGNOSIS — D239 Other benign neoplasm of skin, unspecified: Secondary | ICD-10-CM

## 2019-12-08 DIAGNOSIS — Z1283 Encounter for screening for malignant neoplasm of skin: Secondary | ICD-10-CM

## 2019-12-08 MED ORDER — GABAPENTIN 300 MG PO CAPS: 300.0000 mg | ORAL_CAPSULE | Freq: Three times a day (TID) | ORAL | 2 refills | Status: DC

## 2019-12-08 MED ORDER — MUPIROCIN 2 % EX OINT: TOPICAL_OINTMENT | CUTANEOUS | 2 refills | Status: DC

## 2019-12-08 MED ORDER — MOMETASONE FUROATE 0.1 % EX SOLN: Freq: Every day | CUTANEOUS | 10 refills | Status: DC

## 2019-12-08 NOTE — Progress Notes (Signed)
   Follow-Up Visit   Subjective  Leah Powers is a 59 y.o. female who presents for the following: Skin Problem (Left shin does bleed x 2 years. Has gotten bigger. Also has bump post right thigh raised bump but no bleed. 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 needs more gabapentin and mupirocin. ).   The following portions of the chart were reviewed this encounter and updated as appropriate: Tobacco  Allergies  Meds  Problems  Med Hx  Surg Hx  Fam Hx      Objective  Well appearing patient in no apparent distress; mood and affect are within normal limits.  A full examination was performed including scalp, head, eyes, ears, nose, lips, neck, chest, axillae, abdomen, back, buttocks, bilateral upper extremities, bilateral lower extremities, hands, feet, fingers, toes, fingernails, and toenails. All findings within normal limits unless otherwise noted below.  Objective  Left Elbow - Posterior, Mid Back, Mid Parietal Scalp, Neck - Posterior, Right Elbow - Posterior, Right Upper Back: Deep excoriations left shoulder. Both elbows hyperkeratotic from rubbing.  Objective  waist up and legs: No atypical nevi No signs of non-mole skin cancer.   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 Continue 300 qam and noon.  Ordered Medications: mometasone (ELOCON) 0.1 % lotion  Reordered Medications gabapentin (NEURONTIN) 300 MG capsule mupirocin ointment (BACTROBAN) 2 %  Screening exam for skin cancer waist up and legs  Yearly skin examinations  Dermatofibroma Right Lower Leg - Anterior  observe   I, Jerrol Helmers, PA-C, have reviewed all documentation's for this visit.  The documentation on 12/08/19 for the exam, diagnosis, procedures and orders  are all accurate and complete.

## 2019-12-13 ENCOUNTER — Ambulatory Visit (INDEPENDENT_AMBULATORY_CARE_PROVIDER_SITE_OTHER): Payer: 59 | Admitting: Addiction (Substance Use Disorder)

## 2019-12-13 ENCOUNTER — Encounter: Payer: Self-pay | Admitting: Addiction (Substance Use Disorder)

## 2019-12-13 ENCOUNTER — Other Ambulatory Visit: Payer: Self-pay

## 2019-12-13 DIAGNOSIS — F319 Bipolar disorder, unspecified: Secondary | ICD-10-CM

## 2019-12-13 NOTE — Progress Notes (Signed)
      [facility] Counselor/Therapist Progress Note  Patient ID: TRENACE COUGHLIN, MRN: [id number],    Date: 12/13/2019  Time Spent: 29mins  Symptoms: anxious, heightened mood/energy.   Mental Status Exam:  Appearance:   Casual and Well Groomed     Behavior:  Sharing and Disruptive  Motor:  Normal  Speech/Language:   Clear and Coherent and Pressured  Affect:  Appropriate, Congruent and Full Range  Mood:  anxious, irritable and labile  Thought process:  normal  Thought content:    Rumination and Tangential  Sensory/Perceptual disturbances:    WNL  Orientation:  x4  Attention:  Fair  Concentration:  Good  Memory:  WNL  Fund of knowledge:   Good  Insight:    Good  Judgment:   Good  Impulse Control:  Good   Risk Assessment: Danger to Self:  No Self-injurious Behavior: No Danger to Others: No Duty to Warn:no Physical Aggression / Violence:No  Access to Firearms a concern: No  Gang Involvement:No   Subjective: Client processed feeling more anxious, and having heightened mood/energy. Client discussed stressors in her life since the last appt was so long ago & shared that her mania had been increased even worse over the last month before she had to up her dose of lithium. Client shared about her blood test results that showed a decreased dose of lithium in her blood and her doctor increased her dose to help her stabilize her moods more. Therapist used MI & med management to go over her med changes and validate client's concerns about her increased mania/irritability. Client reported more irritability in general but also more frustration about her husband's expectations of her while she stays at home on disability. Therapist used MI & roleplay with client to validate client's frustration, practice roleplaying how to discuss her frustrations with him, and motivate her to advocate for herself. Therapist inquired about client's stability and client denied SI/HI/AVH or mania.   Interventions:  Cognitive Behavioral Therapy and Motivational Interviewing & med management.  Diagnosis:   ICD-10-CM   1. Bipolar I disorder ([place])  F31.9     Plan of Care: Client to return for biweekly therapy with Sammuel Cooper, therapist, to review again in 6 months. Client to engage in positive self talk and challenging negative internal ruminationsand self talk causing client to be overly anxious and worriedusing CBT, on daily practice. Client to engage in mindfulness: ie body scans each eveneing to help process and discharge emotional distress & recognize emotions & regulate her mood. Client to continue engaging in medication management to continue taking [facility] medications for her health.  Client to utilize BSP (brainspotting) with therapist to help client regulate their energy in a somatic-felt sense way to decrease hypermanic symptoms to stabilize her moods, relationships, and help her manage her life needs and reduce anxietyby 33% in the next 6 months. Client to prioritize sleep 8+ hours each week night AEB going to bed by 10pm each night. Client participated in the treatment planning of their therapy. Client agreed with the plan and understands what to do if there is a crisis: call 9-1-1 and/or crisis line given by therapist.   Barnie Del, LCSW, LCAS, CCTP, CCS-I, BSP

## 2020-01-05 ENCOUNTER — Ambulatory Visit: Payer: 59 | Admitting: Addiction (Substance Use Disorder)

## 2020-01-19 ENCOUNTER — Ambulatory Visit (INDEPENDENT_AMBULATORY_CARE_PROVIDER_SITE_OTHER): Payer: 59 | Admitting: Physician Assistant

## 2020-01-19 ENCOUNTER — Encounter: Payer: Self-pay | Admitting: Physician Assistant

## 2020-01-19 ENCOUNTER — Other Ambulatory Visit: Payer: Self-pay

## 2020-01-19 DIAGNOSIS — Z79899 Other long term (current) drug therapy: Secondary | ICD-10-CM | POA: Diagnosis not present

## 2020-01-19 DIAGNOSIS — E032 Hypothyroidism due to medicaments and other exogenous substances: Secondary | ICD-10-CM

## 2020-01-19 DIAGNOSIS — F319 Bipolar disorder, unspecified: Secondary | ICD-10-CM | POA: Diagnosis not present

## 2020-01-19 DIAGNOSIS — F4323 Adjustment disorder with mixed anxiety and depressed mood: Secondary | ICD-10-CM

## 2020-01-19 MED ORDER — LITHIUM CARBONATE 300 MG PO CAPS: ORAL_CAPSULE | ORAL | 1 refills | Status: DC

## 2020-01-19 NOTE — Progress Notes (Signed)
[facility] Med Check  Patient ID: Leah Powers,  MRN: [id number]  PCP: Donald Prose, MD  Date of Evaluation: 01/19/2020 Time spent:30 minutes  Chief Complaint:  Chief Complaint    Anxiety; Depression      HISTORY/CURRENT STATUS: HPI For routine med check.  Since the LOV, 07/18/2019, she increased the Lithium. States she was told after the last lab in 09/12/2019 to increase it to 1200 mg per day. I do not see record of that in her chart, but a msg was left (see note.) "I got the msg on my phone but I just assumed it was to tell me to go up on the dose."  With the increased Lithium, states she feels quite a bit better. She's noticed and her husband and sister have too. Feels like the 'fog has lifted.' Is able to enjoy things, energy and motivation are good. Hygiene is nl, appetie and wieght are stable. Not isolating.  She sleeps well most of the time.  No SI/HI.   Denies increased energy with decreased need for sleep.  Reports no impulsivity, risky behavior, increased libido, grandiosity, increased spending, paranoia, or hallucinations.  Denies dizziness, syncope, seizures, numbness, tingling, tremor, tics, unsteady gait, slurred speech, confusion. Denies muscle or joint pain, stiffness, or dystonia.  Individual Medical History/ Review of Systems: Changes? :Yes    Past medications for mental health diagnoses include: Prolixin, Haldol, Ativan, lithium, Xanax, Synthroid, gabapentin  Allergies: Eucalyptus oil, Haldol [haloperidol decanoate], Molds & smuts, Other, Penicillins, Bee venom, Corn-containing products, Demeclocycline, Dolobid [diflunisal], Metformin and related, Vicodin [hydrocodone-acetaminophen], Doxycycline, Erythromycin, Keflex [cephalexin], Macrodantin, Motrin [ibuprofen], Septra [bactrim], Tetracyclines & related, and Tofranil-pm  Current Medications:  Current Outpatient Medications:  .  albuterol (VENTOLIN HFA) 108 (90 Base) MCG/ACT inhaler, Inhale into the lungs.,  Disp: , Rfl:  .  albuterol (VENTOLIN HFA) 108 (90 Base) MCG/ACT inhaler, Inhale into the lungs., Disp: , Rfl:  .  Carboxymethylcellulose Sodium (ARTIFICIAL TEARS OP), Apply to eye., Disp: , Rfl:  .  cetirizine (ZYRTEC) 5 MG tablet, Take 5 mg by mouth 2 (two) times daily as needed for allergies. , Disp: , Rfl:  .  diphenhydrAMINE HCl (BENADRYL ALLERGY PO), Take by mouth as needed., Disp: , Rfl:  .  gabapentin (NEURONTIN) 300 MG capsule, Take 1 capsule (300 mg total) by mouth 3 (three) times daily. (Patient taking differently: Take 300 mg by mouth 3 (three) times daily. Takes 2 q am, 1 at lunch, 2 in evening), Disp: 360 capsule, Rfl: 2 .  hydrochlorothiazide (HYDRODIURIL) 25 MG tablet, TAKE ONE TABLET BY MOUTH ONCE DAILY (Patient taking differently: Take 25 mg by mouth daily. ), Disp: 90 tablet, Rfl: 0 .  Lancets (ONETOUCH DELICA PLUS [id number]) MISC, , Disp: , Rfl:  .  levothyroxine (SYNTHROID) 150 MCG tablet, TAKE ONE TABLET BY MOUTH DAILY BEFORE BREAKFAST, Disp: 90 tablet, Rfl: 1 .  lithium carbonate 300 MG capsule, 1 q am, 3 in midafternoon, Disp: 360 capsule, Rfl: 1 .  mometasone (ELOCON) 0.1 % lotion, Apply topically daily., Disp: 60 mL, Rfl: 10 .  naproxen sodium (ALEVE) 220 MG tablet, , Disp: , Rfl:  .  ONETOUCH VERIO test strip, 1 each daily., Disp: , Rfl:  .  rosuvastatin (CRESTOR) 20 MG tablet, Take 20 mg by mouth daily., Disp: , Rfl:  .  triamcinolone cream (KENALOG) 0.1 %, APPLY TO AFFECTED AREA(S) DAILY, Disp: 30 g, Rfl: 2 .  empagliflozin (JARDIANCE) 10 MG TABS tablet, Take 10 mg by mouth daily. (Patient not taking:  Reported on 01/19/2020), Disp: , Rfl:  .  fluconazole (DIFLUCAN) 150 MG tablet, , Disp: , Rfl:  .  levofloxacin (LEVAQUIN) 500 MG tablet, , Disp: , Rfl:  .  losartan (COZAAR) 50 MG tablet, Take 50 mg by mouth daily. , Disp: , Rfl:  .  losartan (COZAAR) 50 MG tablet, Take by mouth., Disp: , Rfl:  .  mupirocin ointment (BACTROBAN) 2 %, APPLY TO AFFECTED AREA(S) DAILY TO TWO  TIMES A DAY (Patient not taking: Reported on 01/19/2020), Disp: 22 g, Rfl: 2 .  predniSONE (DELTASONE) 10 MG tablet, Take 2 tablets by mouth daily with breakfast (Patient not taking: Reported on 01/17/2019), Disp: 60 tablet, Rfl: 0 .  predniSONE (DELTASONE) 10 MG tablet, Take 1 by mouth daily with food for 1 week. Then 1 tab every other day . (Patient not taking: Reported on 07/18/2019), Disp: 30 tablet, Rfl: 0 .  traMADol (ULTRAM) 50 MG tablet, TAKE 1 TABLET BY MOUTH EVERY 12 HOURS AS NEEDED (Patient not taking: Reported on 10/27/2018), Disp: 30 tablet, Rfl: 0 Medication Side Effects: none  Family Medical/ Social History: Changes?  No  MENTAL HEALTH EXAM:  Last menstrual period 07/30/2013.There is no height or weight on file to calculate BMI.  General Appearance: Casual, Neat, Well Groomed and Obese  Eye Contact:  Good  Speech:  Clear and Coherent, Normal Rate and Talkative  Volume:  Normal  Mood:  Euthymic  Affect:  Appropriate  Thought Process:  Goal Directed and Descriptions of Associations: Intact  Orientation:  Full (Time, Place, and Person)  Thought Content: Logical   Suicidal Thoughts:  No  Homicidal Thoughts:  No  Memory:  WNL  Judgement:  Good  Insight:  Good  Psychomotor Activity:  Normal  Concentration:  Concentration: Good  Recall:  Good  Fund of Knowledge: Good  Language: Good  Assets:  Desire for Improvement  ADL's:  Intact  Cognition: WNL  Prognosis:  Good   Most recent pertinent labs 09/12/2019 lithium level 0.5   DIAGNOSES:    ICD-10-CM   1. Bipolar I disorder ([place])  F31.9   2. Encounter for long-term (current) use of medications  Z79.899 Lithium level    TSH    Comprehensive metabolic panel  3. 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 Adjustment disorder with mixed anxiety and depressed mood  F43.23     Receiving Psychotherapy: No    RECOMMENDATIONS:  PDMP was reviewed. I provided 30 minutes of face-to-face time during this encounter. We  discussed the mixup with lithium dose.  On the message, she was told to call back with any questions concerning her last lithium level.  Do not change any medication without talking with me first.   Continue lithium 300 mg, 1 p.o. every morning and 3 p.o. q. afternoon.  States she does not do well with taking them at night, afraid she will forget.   Continue Synthroid 150 mcg, 1 p.o. every morning. Continue gabapentin 300 mg, 1 p.o. 3 times daily. Labs ordered as above.   Return in 6 months.  Donnal Moat, PA-C

## 2020-01-26 ENCOUNTER — Ambulatory Visit: Payer: 59 | Admitting: Addiction (Substance Use Disorder)

## 2020-01-26 LAB — COMPREHENSIVE METABOLIC PANEL
ALT: 20 IU/L (ref 0–32)
AST: 17 IU/L (ref 0–40)
Albumin/Globulin Ratio: 2 (ref 1.2–2.2)
Albumin: 4 g/dL (ref 3.8–4.9)
Alkaline Phosphatase: 107 IU/L (ref 44–121)
BUN/Creatinine Ratio: 21 (ref 9–23)
BUN: 16 mg/dL (ref 6–24)
Bilirubin Total: 0.3 mg/dL (ref 0.0–1.2)
CO2: 26 mmol/L (ref 20–29)
Calcium: 9.6 mg/dL (ref 8.7–10.2)
Chloride: 107 mmol/L — ABNORMAL HIGH (ref 96–106)
Creatinine, Ser: 0.77 mg/dL (ref 0.57–1.00)
GFR calc Af Amer: 98 mL/min/{1.73_m2} (ref >59)
GFR calc non Af Amer: 85 mL/min/{1.73_m2} (ref >59)
Globulin, Total: 2 g/dL (ref 1.5–4.5)
Glucose: 139 mg/dL — ABNORMAL HIGH (ref 65–99)
Potassium: 4.5 mmol/L (ref 3.5–5.2)
Sodium: 144 mmol/L (ref 134–144)
Total Protein: 6 g/dL (ref 6.0–8.5)

## 2020-01-26 LAB — TSH: TSH: 1.23 u[IU]/mL (ref 0.450–4.500)

## 2020-01-26 LAB — LITHIUM LEVEL: Lithium Lvl: 0.9 mmol/L (ref 0.5–1.2)

## 2020-01-26 NOTE — Progress Notes (Signed)
Please let her know that lithium level is 0.9, TSH 1.2, glucose 139, BUN 16, creatinine 0.77, liver function tests are normal.  With the exception of her blood sugar (which is normal if it is not fasting) her labs look great for my purposes.  Continue all same medications and doses.

## 2020-02-01 ENCOUNTER — Ambulatory Visit: Payer: 59 | Admitting: Vascular Surgery

## 2020-02-01 ENCOUNTER — Encounter (HOSPITAL_COMMUNITY): Payer: 59

## 2020-02-16 ENCOUNTER — Ambulatory Visit: Payer: 59 | Admitting: Addiction (Substance Use Disorder)

## 2020-02-23 ENCOUNTER — Ambulatory Visit (INDEPENDENT_AMBULATORY_CARE_PROVIDER_SITE_OTHER): Payer: 59

## 2020-02-23 ENCOUNTER — Encounter: Payer: Self-pay | Admitting: Orthopedic Surgery

## 2020-02-23 ENCOUNTER — Ambulatory Visit (INDEPENDENT_AMBULATORY_CARE_PROVIDER_SITE_OTHER): Payer: 59 | Admitting: Orthopedic Surgery

## 2020-02-23 DIAGNOSIS — M25571 Pain in right ankle and joints of right foot: Secondary | ICD-10-CM

## 2020-02-23 DIAGNOSIS — M79672 Pain in left foot: Secondary | ICD-10-CM

## 2020-02-23 NOTE — Progress Notes (Signed)
Office Visit Note   Patient: Leah Powers           Date of Birth: May 19, 1960           MRN: [id number] Visit Date: 02/23/2020              Requested by: Donald Prose, MD [address] [place],  [place] [address] PCP: Donald Prose, MD  Chief Complaint  Patient presents with  . Right Ankle - Pain  . Right Foot - Pain      HPI: Patient is a 59 year old woman she is status post talonavicular and subtalar fusion on the right.  Patient complains of pain over the base of the lateral aspect of the right foot.  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  Patient states she has recently been diagnosed with diabetes and is on oral medication.  Assessment & Plan: Visit Diagnoses:  1. Pain in left foot   2. Pain in right ankle and joints of right foot     Plan: Patient is given a prescription for extra-large 20-30 compression stockings.  Recommended toe raises to work on her strength and to help with edema control.  Discussed that if she has progressive pain across the midfoot on the left we could proceed with a fusion across the base of the first second and third metatarsal.  Follow-Up Instructions: No follow-ups on file.   Ortho Exam  Patient is alert, oriented, no adenopathy, well-dressed, normal affect, normal respiratory effort. Examination patient has a good pulse bilaterally left foot she has a rocker-bottom deformity with bony spurs across the midfoot this is tender to palpation there is no redness no cellulitis she does have some callus on the plantar aspect of the midfoot.  She does have pitting edema both lower extremities with brawny skin color changes worse on the right than the left no open ulcers.  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  She does have a rocker-bottom deformity with pes planus  XR Ankle 2 Views Right  Result Date: 02/23/2020 2 view radiographs of the right ankle shows a well-healed talonavicular and subtalar fusion there has been failure of the staples but the fusion is stable and intact.  No lytic changes around the hardware.  Patient does have arthritic changes of the calcaneocuboid joint.  No images are attached to the encounter.  Labs: Lab Results  Component Value Date   HGBA1C 5.9 (H) 05/26/2014   GRAMSTAIN No WBC Seen 06/13/2013   GRAMSTAIN No Squamous Epithelial Cells Seen 06/13/2013   GRAMSTAIN No Organisms Seen 06/13/2013   LABORGA Multiple Organisms Present,None Predominant 06/13/2013     Lab Results  Component Value Date   ALBUMIN 4.0 01/25/2020   ALBUMIN 3.3 (L) 05/21/2018   ALBUMIN 4.4 05/19/2018    Lab Results  Component Value Date   MG 2.8 (H) 05/20/2018   No results found for: VD25OH  No results found for: PREALBUMIN CBC EXTENDED Latest Ref Rng & Units 05/20/2018 05/19/2018 10/02/2016  WBC 4.0 - 10.5 K/uL 11.3(H) 11.5(H) 9.3  RBC 3.87 - 5.11 MIL/uL 4.56 4.88 4.47  HGB 12.0 - 15.0 g/dL 12.6 13.4 13.0  HCT 36.0 - 46.0 % 40.5 43.8 41.4  PLT 150 -  400 K/uL 519(H) 579(H) 349  NEUTROABS 1.7 - 7.7 K/uL - - -  LYMPHSABS 0.7 - 4.0 K/uL - - -     There is no height or weight on file to calculate BMI.  Orders:  Orders Placed This Encounter  Procedures  . XR Foot Complete Left  . XR Ankle 2 Views Right   No orders of the defined types were placed in this encounter.    Procedures: No procedures performed  Clinical Data: No additional findings.  ROS:  All other systems negative, except as noted in the HPI. Review of Systems  Objective: Vital Signs: LMP 07/30/2013   Specialty Comments:  No specialty comments available.  PMFS History: Patient Active Problem List   Diagnosis Date Noted  . Nausea  vomiting and diarrhea 05/20/2018  . AKI (acute kidney injury) ([place]) 05/20/2018  . Lithium toxicity 05/20/2018  . DM2 (diabetes mellitus, type 2) ([place]) 05/20/2018  . Bipolar I disorder ([place]) 02/04/2018  . S/P ankle fusion 11/12/2016  . Posterior tibial tendon dysfunction (PTTD) of right lower extremity 10/08/2016  . HTN (hypertension) 05/31/2014  . Hypothyroidism 05/31/2014   Past Medical History:  Diagnosis Date  . Arthritis    L hand, R ankle  (10/08/2016)  . Bipolar 1 disorder ([place])   . Diabetes mellitus without complication ([place])   . Fibromyalgia    told that the breakout on the upper back , could be over active nerves , also treated with cream & Gabapentin- [facility] [place]   . History of kidney stones    spontaneous passing  . Hypertension   . Hypothyroid   . Pneumonia ~ 1967; 07/2016  . Pyelonephritis 1997   treated with IV antibiotic     Family History  Problem Relation Age of Onset  . Breast cancer Maternal Aunt 65  . Breast cancer Maternal Grandmother 35  . Sleep apnea Father   . Hypertension Father   . Diabetes Sister   . Hypertension Sister   . Asthma Sister   . Cancer Paternal Grandfather     Past Surgical History:  Procedure Laterality Date  . BLADDER INSTILLATION  X 2  . CHALAZION EXCISION Bilateral    R- eye X2 , L eye- X1  . FOOT ARTHRODESIS Right 10/08/2016   Procedure: RIGHT TRIPLE ARTHRODESIS;  Surgeon: Newt Minion, MD;  Location: [facility];  Service: Orthopedics;  Laterality: Right;  . FOOT ARTHRODESIS, TRIPLE Right 10/08/2016   ankle  . FRENULECTOMY, LINGUAL  1970s  . LAPAROSCOPIC CHOLECYSTECTOMY  1996  . PALATE SURGERY  1970s   tissue removed from upper palate as a child   . URETHRAL DILATION     Social History   Occupational History  . Not on file  Tobacco Use  . Smoking status: Never Smoker  . Smokeless tobacco: Never Used  Vaping Use  . Vaping Use: Never used  Substance and Sexual Activity  . Alcohol use: No  . Drug  use: No  . Sexual activity: Not Currently

## 2020-02-28 ENCOUNTER — Telehealth: Payer: Self-pay

## 2020-02-28 DIAGNOSIS — L281 Prurigo nodularis: Secondary | ICD-10-CM

## 2020-02-28 MED ORDER — GABAPENTIN 300 MG PO CAPS: ORAL_CAPSULE | ORAL | 1 refills | Status: DC

## 2020-02-29 ENCOUNTER — Encounter (HOSPITAL_COMMUNITY): Payer: 59

## 2020-02-29 ENCOUNTER — Ambulatory Visit: Payer: 59 | Admitting: Vascular Surgery

## 2020-03-02 ENCOUNTER — Other Ambulatory Visit: Payer: Self-pay | Admitting: *Deleted

## 2020-03-02 DIAGNOSIS — L281 Prurigo nodularis: Secondary | ICD-10-CM

## 2020-03-02 MED ORDER — GABAPENTIN 300 MG PO CAPS: ORAL_CAPSULE | ORAL | 1 refills | Status: DC

## 2020-03-13 ENCOUNTER — Ambulatory Visit: Payer: 59 | Admitting: Addiction (Substance Use Disorder)

## 2020-03-29 ENCOUNTER — Telehealth: Payer: Self-pay | Admitting: Physician Assistant

## 2020-03-29 DIAGNOSIS — L281 Prurigo nodularis: Secondary | ICD-10-CM

## 2020-03-29 NOTE — Telephone Encounter (Signed)
Leah Powers called and wanted a note put in her chart that the lithium carbonate that she is taking now went from 3 capsules to 4. She wanted it to be put in the chart so there will not be any confusion of the amount she takes. She will call back when she needs a refill.

## 2020-03-29 NOTE — Telephone Encounter (Signed)
Noted. I received TSH results drawn 03/26/2020, from [facility].  Result was 2.03.  No lithium level was sent.

## 2020-03-29 NOTE — Telephone Encounter (Signed)
KRS patient. Re: gabapentin; Had taken 3/day but upped it to 5/day about end of September or start of October per [facility] instructions. Refills are written for 3/day, so it needs to be rewritten for 5 so she won't run out. [facility]: [facility] [address]. She has a few days of pills left.

## 2020-03-29 NOTE — Telephone Encounter (Signed)
Patient has a rx for 1po  3x/day.   I want to make sure she knows I only want her to take 300 (1 pill) 5 times per day.  30 day supply #150 with 2 rf ok to pharmacy. thanks

## 2020-03-30 MED ORDER — GABAPENTIN 300 MG PO CAPS: 300.0000 mg | ORAL_CAPSULE | Freq: Every day | ORAL | 2 refills | Status: DC

## 2020-03-30 NOTE — Telephone Encounter (Signed)
Phone call to patient to inform her that her medication has been changed and corrected.  Patient aware.

## 2020-05-07 ENCOUNTER — Other Ambulatory Visit: Payer: Self-pay | Admitting: Physician Assistant

## 2020-07-07 ENCOUNTER — Emergency Department (HOSPITAL_COMMUNITY)
Admission: EM | Admit: 2020-07-07 | Discharge: 2020-07-07 | Disposition: A | Discharge status: Home / Self Care | Payer: 59 | Attending: Emergency Medicine | Admitting: Emergency Medicine

## 2020-07-07 ENCOUNTER — Emergency Department (HOSPITAL_COMMUNITY): Payer: 59

## 2020-07-07 ENCOUNTER — Encounter (HOSPITAL_COMMUNITY): Payer: Self-pay | Admitting: *Deleted

## 2020-07-07 ENCOUNTER — Other Ambulatory Visit: Payer: Self-pay

## 2020-07-07 DIAGNOSIS — E039 Hypothyroidism, unspecified: Secondary | ICD-10-CM | POA: Diagnosis not present

## 2020-07-07 DIAGNOSIS — R4689 Other symptoms and signs involving appearance and behavior: Secondary | ICD-10-CM

## 2020-07-07 DIAGNOSIS — E0590 Thyrotoxicosis, unspecified without thyrotoxic crisis or storm: Secondary | ICD-10-CM

## 2020-07-07 DIAGNOSIS — R251 Tremor, unspecified: Secondary | ICD-10-CM | POA: Diagnosis not present

## 2020-07-07 DIAGNOSIS — R197 Diarrhea, unspecified: Secondary | ICD-10-CM | POA: Diagnosis present

## 2020-07-07 DIAGNOSIS — R4189 Other symptoms and signs involving cognitive functions and awareness: Secondary | ICD-10-CM

## 2020-07-07 DIAGNOSIS — I10 Essential (primary) hypertension: Secondary | ICD-10-CM | POA: Diagnosis not present

## 2020-07-07 DIAGNOSIS — R21 Rash and other nonspecific skin eruption: Secondary | ICD-10-CM | POA: Diagnosis not present

## 2020-07-07 DIAGNOSIS — E119 Type 2 diabetes mellitus without complications: Secondary | ICD-10-CM | POA: Insufficient documentation

## 2020-07-07 DIAGNOSIS — R7989 Other specified abnormal findings of blood chemistry: Secondary | ICD-10-CM

## 2020-07-07 DIAGNOSIS — Z79899 Other long term (current) drug therapy: Secondary | ICD-10-CM | POA: Insufficient documentation

## 2020-07-07 LAB — URINALYSIS, ROUTINE W REFLEX MICROSCOPIC
Bilirubin Urine: NEGATIVE
Glucose, UA: NEGATIVE mg/dL
Hgb urine dipstick: NEGATIVE
Ketones, ur: 20 mg/dL — AB
Nitrite: NEGATIVE
Protein, ur: 30 mg/dL — AB
Specific Gravity, Urine: 1.018 (ref 1.005–1.030)
pH: 6 (ref 5.0–8.0)

## 2020-07-07 LAB — COMPREHENSIVE METABOLIC PANEL
ALT: 27 U/L (ref 0–44)
AST: 28 U/L (ref 15–41)
Albumin: 4.1 g/dL (ref 3.5–5.0)
Alkaline Phosphatase: 169 U/L — ABNORMAL HIGH (ref 38–126)
Anion gap: 8 (ref 5–15)
BUN: 30 mg/dL — ABNORMAL HIGH (ref 6–20)
CO2: 23 mmol/L (ref 22–32)
Calcium: 10.3 mg/dL (ref 8.9–10.3)
Chloride: 108 mmol/L (ref 98–111)
Creatinine, Ser: 0.92 mg/dL (ref 0.44–1.00)
GFR, Estimated: >60 mL/min (ref >60)
Glucose, Bld: 126 mg/dL — ABNORMAL HIGH (ref 70–99)
Potassium: 4 mmol/L (ref 3.5–5.1)
Sodium: 139 mmol/L (ref 135–145)
Total Bilirubin: 0.7 mg/dL (ref 0.3–1.2)
Total Protein: 7.1 g/dL (ref 6.5–8.1)

## 2020-07-07 LAB — CBC WITH DIFFERENTIAL/PLATELET
Abs Immature Granulocytes: 0.04 10*3/uL (ref 0.00–0.07)
Basophils Absolute: 0.1 10*3/uL (ref 0.0–0.1)
Basophils Relative: 1 %
Eosinophils Absolute: 0 10*3/uL (ref 0.0–0.5)
Eosinophils Relative: 0 %
HCT: 40.1 % (ref 36.0–46.0)
Hemoglobin: 12.5 g/dL (ref 12.0–15.0)
Immature Granulocytes: 0 %
Lymphocytes Relative: 11 %
Lymphs Abs: 1.1 10*3/uL (ref 0.7–4.0)
MCH: 26.9 pg (ref 26.0–34.0)
MCHC: 31.2 g/dL (ref 30.0–36.0)
MCV: 86.2 fL (ref 80.0–100.0)
Monocytes Absolute: 0.5 10*3/uL (ref 0.1–1.0)
Monocytes Relative: 5 %
Neutro Abs: 8.9 10*3/uL — ABNORMAL HIGH (ref 1.7–7.7)
Neutrophils Relative %: 83 %
Platelets: 402 10*3/uL — ABNORMAL HIGH (ref 150–400)
RBC: 4.65 MIL/uL (ref 3.87–5.11)
RDW: 14.6 % (ref 11.5–15.5)
WBC: 10.6 10*3/uL — ABNORMAL HIGH (ref 4.0–10.5)
nRBC: 0 % (ref 0.0–0.2)

## 2020-07-07 LAB — LIPASE, BLOOD: Lipase: 39 U/L (ref 11–51)

## 2020-07-07 LAB — ACETAMINOPHEN LEVEL: Acetaminophen (Tylenol), Serum: <10 ug/mL — ABNORMAL LOW (ref 10–30)

## 2020-07-07 LAB — MAGNESIUM: Magnesium: 2.2 mg/dL (ref 1.7–2.4)

## 2020-07-07 LAB — TSH: TSH: 0.082 u[IU]/mL — ABNORMAL LOW (ref 0.350–4.500)

## 2020-07-07 LAB — SALICYLATE LEVEL: Salicylate Lvl: <7 mg/dL — ABNORMAL LOW (ref 7.0–30.0)

## 2020-07-07 LAB — LITHIUM LEVEL: Lithium Lvl: 1.38 mmol/L — ABNORMAL HIGH (ref 0.60–1.20)

## 2020-07-07 MED ORDER — IOHEXOL 300 MG/ML  SOLN
100.0000 mL | Freq: Once | INTRAMUSCULAR | Status: AC | PRN
  Administered 2020-07-07: 100 mL via INTRAVENOUS

## 2020-07-07 MED ORDER — SODIUM CHLORIDE 0.9 % IV BOLUS
1000.0000 mL | Freq: Once | INTRAVENOUS | Status: AC
  Administered 2020-07-07: 1000 mL via INTRAVENOUS

## 2020-07-07 NOTE — ED Provider Notes (Signed)
  Face-to-face evaluation   History: She is here for evaluation of constellation of symptoms including shakiness, itchy rash on her face, and diarrhea.  She is taking her usual medications.  She has previously had lithium toxic episode associated with use of metformin  Physical exam: Alert elderly female.  Nontoxic in appearance.  No dysarthria or aphasia.  No significant tremor or weakness of the extremities.  Abdomen soft nontender palpation.  The patient is lucid.  She is not lethargic  Medical screening examination/treatment/procedure(s) were conducted as a shared visit with non-physician practitioner(s) and myself.  I personally evaluated the patient during the encounter    Daleen Bo, MD 07/07/20 [phone]

## 2020-07-07 NOTE — ED Provider Notes (Addendum)
[facility] DEPT Provider Note   CSN: [id number] Arrival date & time: 07/07/20  1200     History Chief Complaint  Patient presents with  . Rash  . Diarrhea  . Tremors    Leah Powers is a 60 y.o. female with past medical history significant for bipolar 1 disorder on lithium, diabetes, fibromyalgia, hypertension, hypothyroid.  HPI Patient presents to emergency room today with chief complaint of rash, diarrhea and tremors.  Her significant other is at the bedside.  They recently separated however are working towards getting back together.  He contributes to history. Patient states she has had diarrhea for the last x1 week.  She describes it as rust color.  She had 3 episodes today.  She denies any associated abdominal pain however admits to nausea without emesis.  She has had decreased p.o. intake because of this.  Patient also endorses feeling off balance.  She states when walking up the stairs to get to her apartment she feels like she is on a boat.  She denies falling or any head injuries recently.  Patient does state that she lives alone.  She is also endorsing a rash on her face.  She states that has been flaking.  This has also going on x1 week.  She has not tried any over-the-counter medications for the rash. Patient attempted to see PCP yesterday.  They waited 3 hours and left without being seen by provider.  Significant other brought her to the ER today to have her evaluated as she still seems "off." She denies any drug or alcohol use.  Denies any recent ibuprofen or Tyleno intake either.   Chart review shows patient admitted for lithium toxicity 05/20/2018.   Past Medical History:  Diagnosis Date  . Arthritis    L hand, R ankle  (10/08/2016)  . Bipolar 1 disorder ([place])   . Diabetes mellitus without complication ([place])   . Fibromyalgia    told that the breakout on the upper back , could be over active nerves , also treated with cream & Gabapentin-  [facility] [place]   . History of kidney stones    spontaneous passing  . Hypertension   . Hypothyroid   . Pneumonia ~ 1967; 07/2016  . Pyelonephritis 1997   treated with IV antibiotic     Patient Active Problem List   Diagnosis Date Noted  . Nausea vomiting and diarrhea 05/20/2018  . AKI (acute kidney injury) ([place]) 05/20/2018  . Lithium toxicity 05/20/2018  . DM2 (diabetes mellitus, type 2) ([place]) 05/20/2018  . Bipolar I disorder ([place]) 02/04/2018  . S/P ankle fusion 11/12/2016  . Posterior tibial tendon dysfunction (PTTD) of right lower extremity 10/08/2016  . HTN (hypertension) 05/31/2014  . Hypothyroidism 05/31/2014    Past Surgical History:  Procedure Laterality Date  . BLADDER INSTILLATION  X 2  . CHALAZION EXCISION Bilateral    R- eye X2 , L eye- X1  . FOOT ARTHRODESIS Right 10/08/2016   Procedure: RIGHT TRIPLE ARTHRODESIS;  Surgeon: Newt Minion, MD;  Location: [facility];  Service: Orthopedics;  Laterality: Right;  . FOOT ARTHRODESIS, TRIPLE Right 10/08/2016   ankle  . FRENULECTOMY, LINGUAL  1970s  . LAPAROSCOPIC CHOLECYSTECTOMY  1996  . PALATE SURGERY  1970s   tissue removed from upper palate as a child   . URETHRAL DILATION       OB History   No obstetric history on file.     Family History  Problem Relation Age of Onset  . Breast cancer Maternal Aunt 65  . Breast cancer Maternal Grandmother 35  . Sleep apnea Father   . Hypertension Father   . Diabetes Sister   . Hypertension Sister   . Asthma Sister   . Cancer Paternal Grandfather     Social History   Tobacco Use  . Smoking status: Never Smoker  . Smokeless tobacco: Never Used  Vaping Use  . Vaping Use: Never used  Substance Use Topics  . Alcohol use: No  . Drug use: No    Home Medications Prior to Admission medications   Medication Sig Start Date End Date Taking? Authorizing Provider  albuterol (VENTOLIN HFA) 108 (90 Base) MCG/ACT inhaler Inhale into the lungs. 11/24/18    [provider]  albuterol (VENTOLIN HFA) 108 (90 Base) MCG/ACT inhaler Inhale into the lungs. 10/23/19   [provider]  Carboxymethylcellulose Sodium (ARTIFICIAL TEARS OP) Apply to eye.    [provider]  cetirizine (ZYRTEC) 5 MG tablet Take 5 mg by mouth 2 (two) times daily as needed for allergies.     [provider]  diphenhydrAMINE HCl (BENADRYL ALLERGY PO) Take by mouth as needed.    [provider]  empagliflozin (JARDIANCE) 10 MG TABS tablet Take 10 mg by mouth daily. Patient not taking: Reported on 01/19/2020    [provider]  fluconazole (DIFLUCAN) 150 MG tablet  10/11/19   [provider]  gabapentin (NEURONTIN) 300 MG capsule Take 1 capsule (300 mg total) by mouth 5 (five) times daily. 03/30/20   Sheffield, Ronalee Red, PA-C  hydrochlorothiazide (HYDRODIURIL) 25 MG tablet TAKE ONE TABLET BY MOUTH ONCE DAILY Patient taking differently: Take 25 mg by mouth daily.  09/18/16   Orlena Sheldon, PA-C  Lancets (ONETOUCH DELICA PLUS Q000111Q) MISC  10/26/19   [provider]  levothyroxine (SYNTHROID) 150 MCG tablet TAKE ONE TABLET BY MOUTH DAILY BEFORE BREAKFAST 05/07/20   Donnal Moat T, PA-C  lithium carbonate 300 MG capsule 1 q am, 3 in midafternoon 01/19/20   Hurst, Teresa T, PA-C  losartan (COZAAR) 50 MG tablet Take 50 mg by mouth daily.  10/05/18 10/26/19  [provider]  losartan (COZAAR) 50 MG tablet Take by mouth. 10/05/18   [provider]  mometasone (ELOCON) 0.1 % lotion Apply topically daily. 12/08/19   Sheffield, Vida Roller R, PA-C  mupirocin ointment (BACTROBAN) 2 % APPLY TO AFFECTED AREA(S) DAILY TO TWO TIMES A DAY Patient not taking: Reported on 01/19/2020 12/08/19   Warren Danes, PA-C  naproxen sodium (ALEVE) 220 MG tablet  03/17/18   [provider]  [facility] VERIO test strip 1 each daily. 10/26/19   [provider]  predniSONE (DELTASONE) 10 MG tablet Take 1 by mouth daily with  food for 1 week. Then 1 tab every other day . Patient not taking: Reported on 07/18/2019 01/06/19   Newt Minion, MD  rosuvastatin (CRESTOR) 20 MG tablet Take 20 mg by mouth daily.    [provider]  traMADol (ULTRAM) 50 MG tablet TAKE 1 TABLET BY MOUTH EVERY 12 HOURS AS NEEDED Patient not taking: Reported on 10/27/2018 08/27/18   Newt Minion, MD  triamcinolone cream (KENALOG) 0.1 % APPLY TO AFFECTED AREA(S) DAILY 08/16/19   Sheffield, Vida Roller R, PA-C    Allergies    Eucalyptus oil, Haldol [haloperidol decanoate], Molds & smuts, Other, Penicillins, Bee venom, Corn-containing products, Demeclocycline, Dolobid [diflunisal], Metformin and related, Vicodin [hydrocodone-acetaminophen], Doxycycline, Erythromycin, Keflex [  cephalexin], Macrodantin, Motrin [ibuprofen], Septra [bactrim], Tetracyclines & related, and Tofranil-pm  Review of Systems   Review of Systems All other systems are reviewed and are negative for acute change except as noted in the HPI.  Physical Exam Updated Vital Signs BP (!) 168/73 (BP Location: Left Arm)   Pulse 78   Temp 98.9 F (37.2 C) (Oral)   Resp 18   LMP 07/30/2013   SpO2 93%   Physical Exam Vitals and nursing note reviewed.  Constitutional:      General: She is not in acute distress.    Appearance: She is not ill-appearing.  HENT:     Head: Normocephalic and atraumatic.     Comments: No tenderness to palpation of skull. No deformities or crepitus noted. No open wounds, abrasions or lacerations.    Right Ear: Tympanic membrane and external ear normal.     Left Ear: Tympanic membrane and external ear normal.     Nose: Nose normal.     Mouth/Throat:     Mouth: Mucous membranes are moist.     Pharynx: Oropharynx is clear.  Eyes:     General: No scleral icterus.       Right eye: No discharge.        Left eye: No discharge.     Extraocular Movements: Extraocular movements intact.     Conjunctiva/sclera: Conjunctivae normal.     Pupils: Pupils are  equal, round, and reactive to light.  Neck:     Vascular: No JVD.  Cardiovascular:     Rate and Rhythm: Normal rate and regular rhythm.     Pulses: Normal pulses.          Radial pulses are 2+ on the right side and 2+ on the left side.     Heart sounds: Normal heart sounds.  Pulmonary:     Comments: Lungs clear to auscultation in all fields. Symmetric chest rise. No wheezing, rales, or rhonchi. Abdominal:     Comments: Abdomen is soft, non-distended, and non-tender in all quadrants. No rigidity, no guarding. No peritoneal signs.  Musculoskeletal:        General: Normal range of motion.     Cervical back: Normal range of motion.  Skin:    General: Skin is warm and dry.     Capillary Refill: Capillary refill takes less than 2 seconds.  Neurological:     Mental Status: She is oriented to person, place, and time.     GCS: GCS eye subscore is 4. GCS verbal subscore is 5. GCS motor subscore is 6.     Comments: Fluent speech, no facial droop.  Intermittent difficulty word finding.  Patient laughing and giggling at inappropriate times  Cranial nerves II through XII are intact.  Difficulty with finger-to-nose with tremor noted bilaterally.  Strong and equal grip strength in bilateral upper and lower extremities.  Normal sensation in all extremities.  Patient ambulates with slow and widened gait while saying she feels off balance  Psychiatric:        Behavior: Behavior normal.     ED Results / Procedures / Treatments   Labs (all labs ordered are listed, but only abnormal results are displayed) Labs Reviewed  COMPREHENSIVE METABOLIC PANEL - Abnormal; Notable for the following components:      Result Value   Glucose, Bld 126 (*)    BUN 30 (*)    Alkaline Phosphatase 169 (*)    All other components within normal limits  CBC WITH DIFFERENTIAL/PLATELET -  Abnormal; Notable for the following components:   WBC 10.6 (*)    Platelets 402 (*)    Neutro Abs 8.9 (*)    All other components  within normal limits  URINALYSIS, ROUTINE W REFLEX MICROSCOPIC - Abnormal; Notable for the following components:   APPearance HAZY (*)    Ketones, ur 20 (*)    Protein, ur 30 (*)    Leukocytes,Ua MODERATE (*)    Bacteria, UA FEW (*)    All other components within normal limits  TSH - Abnormal; Notable for the following components:   TSH 0.082 (*)    All other components within normal limits  SALICYLATE LEVEL - Abnormal; Notable for the following components:   Salicylate Lvl <8.4 (*)    All other components within normal limits  ACETAMINOPHEN LEVEL - Abnormal; Notable for the following components:   Acetaminophen (Tylenol), Serum <10 (*)    All other components within normal limits  URINE CULTURE  LIPASE, BLOOD  MAGNESIUM  LITHIUM LEVEL  T3    EKG None  Radiology CT Head Wo Contrast  Result Date: 07/07/2020 CLINICAL DATA:  Altered mental status. EXAM: CT HEAD WITHOUT CONTRAST TECHNIQUE: Contiguous axial images were obtained from the base of the skull through the vertex without intravenous contrast. COMPARISON:  None. FINDINGS: Brain: No evidence of acute infarction, hemorrhage, hydrocephalus, extra-axial collection or mass lesion/mass effect. Vascular: No hyperdense vessel or unexpected calcification. Skull: Normal. Negative for fracture or focal lesion. Sinuses/Orbits: No acute finding. Other: None. IMPRESSION: No acute intracranial process. Electronically Signed   By: Zerita Boers M.D.   On: 07/07/2020 15:24   CT ABDOMEN PELVIS W CONTRAST  Result Date: 07/07/2020 CLINICAL DATA:  Diarrhea. Patient reports left flank pain. Mental status change. EXAM: CT ABDOMEN AND PELVIS WITH CONTRAST TECHNIQUE: Multidetector CT imaging of the abdomen and pelvis was performed using the standard protocol following bolus administration of intravenous contrast. CONTRAST:  152mL OMNIPAQUE IOHEXOL 300 MG/ML  SOLN COMPARISON:  Abdominopelvic CT 06/12/2011 FINDINGS: Lower chest: Hypoventilatory  atelectasis in both lung bases. No confluent consolidation. No pleural fluid. Heart is normal in size. Hepatobiliary: No focal liver abnormality is seen. Status post cholecystectomy. No biliary dilatation. Pancreas: No ductal dilatation or inflammation. Spleen: Normal in size without focal abnormality. Adrenals/Urinary Tract: Low-density right adrenal mass measuring 3.4 x 2.8 cm, not significantly changed from prior exam. Normal left adrenal gland. No hydronephrosis or perinephric edema. Homogeneous renal enhancement with symmetric excretion on delayed phase imaging. No visualized renal stones. Small low-density lesions in the lower right kidney are too small to characterize but likely cyst. Urinary bladder is physiologically distended without wall thickening. Stomach/Bowel: Unremarkable a decompressed stomach. Normal positioning of the duodenum and ligament Treitz. Decompressed small bowel without wall thickening or inflammation. Normal appendix. Questionable short segment wall thickening of the ascending colon, series 3, image 57, versus nondistention majority of the colon is decompressed. Occasional descending and sigmoid colonic diverticula without diverticulitis. No colonic inflammation. No liquid stool in the colon to suggest diarrheal process. Vascular/Lymphatic: Normal caliber abdominal aorta. Patent portal vein. No acute vascular finding. No portal venous or mesenteric gas. Small ileocolic lymph nodes are not enlarged by size criteria, likely reactive. No retroperitoneal or upper abdominal adenopathy. Reproductive: Heterogeneous uterus with multiple fibroids, slightly decreased in size from prior exam. Largest fibroid appears posteriorly arising from the lower uterine segment spanning 4.8 cm. No evidence of adnexal mass. Other: No free air, free fluid, or intra-abdominal fluid collection. Small fat containing umbilical hernia.  Musculoskeletal: Grade 1 anterolisthesis of L4 on L5 with associated degenerative  disc disease and facet hypertrophy. Additional degenerative disc disease at L5-S1. No acute osseous abnormalities are seen. IMPRESSION: 1. Questionable short segment circumferential wall thickening of the ascending colon versus nondistention. Recommend correlation with colonoscopy to exclude underlying colonic neoplasm. Majority of the colon is decompressed, without definite colonic inflammation. 2. Minimal distal colonic diverticulosis without diverticulitis. 3. Unchanged right adrenal adenoma. 4. Uterine fibroids. 5. Small fat containing umbilical hernia. 6. Degenerative change in the lumbar spine at L4-L5 and L5-S1, with progression from 2013. Electronically Signed   By: Keith Rake M.D.   On: 07/07/2020 15:28    Procedures Procedures   Medications Ordered in ED Medications  sodium chloride 0.9 % bolus 1,000 mL (0 mLs Intravenous Stopped 07/07/20 1530)  iohexol (OMNIPAQUE) 300 MG/ML solution 100 mL (100 mLs Intravenous Contrast Given 07/07/20 1429)    ED Course  I have reviewed the triage vital signs and the nursing notes.  Pertinent labs & imaging results that were available during my care of the patient were reviewed by me and considered in my medical decision making (see chart for details).    MDM Rules/Calculators/A&P                          History provided by patient with additional history obtained from chart review.    Patient presenting with diarrhea, rash.  On ED arrival she is afebrile, hemodynamically stable.  Patient has inappropriate response to questions at times.  She is using the correct words however is giggling and laughing inappropriately.  Significant other bedside states this is change in her typical behavior.  Neuro exam with difficulty finger to nose.  She has tremor with movement.  Not noted at rest.  Off balance gate.  No focal weakness.  Outside  tPA window.  Abdominal exam with diffuse tenderness.  No peritoneal signs.  Patient has dry flaking rash on her  face.  She does also look dehydrated, mucous membranes are dry.  Given liter of IV fluids. Discussed HPI, physical exam and plan of care for this patient with attending Dr. Eulis Foster. The attending physician evaluated this patient as part of a shared visit and agrees with plan of care.  DDx based on symptoms include lithium toxicity.  Work-up initiated with labs and CT head and abdomen pelvis to look for causes of diarrhea including colitis.  EKG without ischemic changes.  CBC with nonspecific leukocytosis of 10.6, no anemia.  Lipase within normal range.  CMP with out significant electrolyte derangement, BUN slightly elevated at 30, normal creatinine, no transaminitis.  Checked acetaminophen and salicylate levels to look for possible overdose or ingestion.  Those results are also negative.  TSH collected and is low at 0.082.  T3 in process.  UA shows ketones of 20, moderate leukocytes, 11-20 WBC although there are squamous epithelial cells present.  Question contaminated sample.  Patient has no urinary symptoms however is unable to give clear history.  Urine culture has been ordered.  CT head without acute abnormalities.   Patient care transferred to J. Soto PA-C at the end of my shift pending CT abdomen pelvis and lithium level.. Patient presentation, ED course, and plan of care discussed with review of all pertinent labs and imaging. Please see her note for further details regarding further ED course and disposition.     Portions of this note were generated with Lobbyist. Dictation errors  may occur despite best attempts at proofreading.    Final Clinical Impression(s) / ED Diagnoses Final diagnoses:  None    Rx / DC Orders ED Discharge Orders    None       Barrie Folk, PA-C 07/07/20 1534    Barrie Folk, PA-C 07/07/20 1647    Daleen Bo, MD 07/07/20 [phone]

## 2020-07-07 NOTE — ED Notes (Signed)
MD at bedside speaking with patient about discharge and follow up instructions.

## 2020-07-07 NOTE — ED Provider Notes (Signed)
  Physical Exam  BP (!) 129/58   Pulse 69   Temp 97.7 F (36.5 C) (Oral)   Resp 17   LMP 07/30/2013   SpO2 94%   Physical Exam  ED Course/Procedures   Clinical Course as of 07/08/20 0000  Sat Jul 07, 2020  1841 Lithium(!): 1.38 [JS]    Clinical Course User Index [JS] Janeece Fitting, PA-C    Procedures  MDM  Patient care assumed from Eloise Harman.  PA-C at shift change, please see her note for full HPI.  Briefly, patient here with rash, diarrhea, tremors.  Here with rash that has been flaking located on her face for the past week.  Has not tried anything over-the-counter for symptomatic treatment.  And is to follow-up for lithium level along with CT abdomen.  CT head without any acute pathology.  Plan is for follow-up on lithium level, CT abdomen.  CT abdomen showed:  1. Questionable short segment circumferential wall thickening of the  ascending colon versus nondistention. Recommend correlation with  colonoscopy to exclude underlying colonic neoplasm. Majority of the  colon is decompressed, without definite colonic inflammation.  2. Minimal distal colonic diverticulosis without diverticulitis.  3. Unchanged right adrenal adenoma.  4. Uterine fibroids.  5. Small fat containing umbilical hernia.  6. Degenerative change in the lumbar spine at L4-L5 and L5-S1, with  progression from 2013.     Lithium level at 1.38, although similar presentation in the past with lithium toxicity.  Lithium level on today's visit is slightly elevated.  TSH slightly decreased.  T3 is pending.I discussed case with my attending Dr. Joya Gaskins was also evaluated the patient and agrees with discharge.  Patient is to follow-up with her PCP on Monday, in order to obtain a recheck of her lithium level.  Husband at the bedside is agreeable with plan, vitals have remained stable.  We discussed findings of the CT abdomen.  She is to follow-up again on outpatient basis.  Return precautions discussed at length, patient  stable for discharge.  Portions of this note were generated with Lobbyist. Dictation errors may occur despite best attempts at proofreading.       Janeece Fitting, PA-C 07/08/20 0000    Arnaldo Natal, MD 07/08/20 2130

## 2020-07-07 NOTE — ED Notes (Signed)
Patient in CT

## 2020-07-07 NOTE — ED Notes (Signed)
Patient is resting quietly.  No distress noted

## 2020-07-07 NOTE — ED Notes (Signed)
Patient assisted to the [place] via wheelchair.  Alert and answers questions appropriately

## 2020-07-07 NOTE — Discharge Instructions (Signed)
Please have your doctor adjust your thyroid medication. Please notify your doctor about your lithium level.

## 2020-07-07 NOTE — ED Notes (Signed)
Patient answers questions appropriately.  Noticeable shaking noted

## 2020-07-07 NOTE — ED Triage Notes (Signed)
Pt complains of rash, diarrhea, jitteriness, lack of appetite for the past week. Also complains of left flank pain x 3 days.

## 2020-07-08 LAB — URINE CULTURE: Culture: NO GROWTH

## 2020-07-09 ENCOUNTER — Telehealth: Payer: Self-pay | Admitting: Physician Assistant

## 2020-07-09 ENCOUNTER — Other Ambulatory Visit: Payer: Self-pay | Admitting: Physician Assistant

## 2020-07-09 DIAGNOSIS — R202 Paresthesia of skin: Secondary | ICD-10-CM

## 2020-07-09 LAB — T3: T3, Total: 100 ng/dL (ref 71–180)

## 2020-07-09 NOTE — Telephone Encounter (Signed)
Traci give her a call, see how she is feeling and confirm lithium dose.  My last note shows 300 mg in the morning and 900 in the afternoon.  Have her decrease the lithium total daily dose from 1200 mg down to 900 mg.  She can take 1 pill in the morning and 2 in the afternoon if she prefers or take them all 3 at the same time.  Whichever works best for her.  I want the lithium level to be checked again in 1 week.  I will order that and ask for Baxter Flattery to mail the order to her.  Let me know if she will use [facility] or request please.  Thank you.

## 2020-07-09 NOTE — Telephone Encounter (Signed)
Rtc to pt and she reports taking 1200 mg daily of Lithium, advised her to decrease to 900 mg. She also reports ER told her to hold her levothyroxine till elevated by her doctor. She was asking if Helene Kelp was also going to check a TSH in a week?  She does use [facility].

## 2020-07-09 NOTE — Telephone Encounter (Signed)
Please review

## 2020-07-09 NOTE — Telephone Encounter (Signed)
Pt called and wanted Leah Powers know that she was in the er over the weekend. She wants Leah Powers to look at the notes from the visit. She is still feeling bad and has an appt on 07/19/20. If you want to work her in let us know.

## 2020-07-10 ENCOUNTER — Other Ambulatory Visit: Payer: Self-pay | Admitting: Physician Assistant

## 2020-07-10 DIAGNOSIS — Z79899 Other long term (current) drug therapy: Secondary | ICD-10-CM

## 2020-07-10 MED ORDER — LEVOTHYROXINE SODIUM 125 MCG PO TABS: 125.0000 ug | ORAL_TABLET | Freq: Every day | ORAL | 1 refills | Status: DC

## 2020-07-10 NOTE — Telephone Encounter (Signed)
Leah Powers, the lab order has been done and printed already.   [name], please let her know it is too soon to check a TSH.  I have sent in a prescription for levothyroxine 125 mcg, a decrease from 150 mcg.  I do not want to keep her off of the levothyroxine completely and I think decreasing the dose will be sufficient to regulate the TSH.  Will order TSH to be done in approximately 1 month.  We can discuss that at our visit on 07/19/2020.  Prescription was sent to [facility] in [facility], which is where she most recently had levothyroxine and lithium sent.

## 2020-07-10 NOTE — Telephone Encounter (Signed)
Pt called about lab orders. Apt 5/5 and labs in a week 5/3. Please advise when orders are in system. I will print and mail to Pt.

## 2020-07-10 NOTE — Telephone Encounter (Signed)
Pt informed

## 2020-07-19 ENCOUNTER — Ambulatory Visit (INDEPENDENT_AMBULATORY_CARE_PROVIDER_SITE_OTHER): Payer: 59 | Admitting: Physician Assistant

## 2020-07-19 ENCOUNTER — Encounter: Payer: Self-pay | Admitting: Physician Assistant

## 2020-07-19 ENCOUNTER — Other Ambulatory Visit: Payer: Self-pay

## 2020-07-19 DIAGNOSIS — F319 Bipolar disorder, unspecified: Secondary | ICD-10-CM | POA: Diagnosis not present

## 2020-07-19 DIAGNOSIS — E032 Hypothyroidism due to medicaments and other exogenous substances: Secondary | ICD-10-CM

## 2020-07-19 DIAGNOSIS — Z79899 Other long term (current) drug therapy: Secondary | ICD-10-CM

## 2020-07-19 MED ORDER — LITHIUM CARBONATE 300 MG PO CAPS: 900.0000 mg | ORAL_CAPSULE | Freq: Every day | ORAL | 1 refills | Status: DC

## 2020-07-19 NOTE — Progress Notes (Signed)
[facility]  Patient ID: Leah Powers,  MRN: [id number]  PCP: Donald Prose, MD  Date of Evaluation: 07/19/2020 Time spent:45 minutes  Chief Complaint:  Chief Complaint    Follow-up; Anxiety; Depression      HISTORY/CURRENT STATUS: HPI for hospital follow-up.  Went to ER 07/07/2020 for imbalance, confusion,  nausea/vomiting, bloody diarrhea. Sx for about a week before going to the ER.  Lithium level was elevated and TSH was very low.  See labs noted below.  She was given IV fluids and went home. Has been feeling back to normal the past week.  Since her lithium level was elevated her dose was decreased from 1200 mg daily to 900 mg.  The Synthroid dose was decreased and she has been on that since April 27 she believes.  As far as her mental health is concerned she feels well and had been feeling well before she became acutely ill.  She had been able to enjoy things, energy and motivation were pretty good most of the time.  Not isolating.  Appetite is stable and weight is unchanged.  She does still need no suicidal or homicidal thoughts.  Patient denies increased energy with decreased need for sleep, no increased talkativeness, no racing thoughts, no impulsivity or risky behaviors, no increased spending, no decreased libido, no grandiosity, no increased irritability or anger, no paranoia, and no hallucinations.  Denies dizziness, syncope, seizures, numbness, tingling, tremor, tics, unsteady gait, slurred speech, confusion. Denies muscle or joint pain, stiffness, or dystonia.  Individual Medical History/ Review of Systems: Changes? :Yes    Past medications for mental health diagnoses include: Prolixin, Haldol, Ativan, lithium, Xanax, Synthroid, gabapentin  Allergies: Eucalyptus oil, Haldol [haloperidol decanoate], Molds & smuts, Other, Penicillins, Bee venom, Corn-containing products, Demeclocycline, Dolobid [diflunisal], Metformin and related, Vicodin  [hydrocodone-acetaminophen], Doxycycline, Erythromycin, Keflex [cephalexin], Macrodantin, Motrin [ibuprofen], Septra [bactrim], Tetracyclines & related, and Tofranil-pm  Current Medications:  Current Outpatient Medications:  .  albuterol (VENTOLIN HFA) 108 (90 Base) MCG/ACT inhaler, Inhale 2 puffs into the lungs every 6 (six) hours as needed for wheezing or shortness of breath., Disp: , Rfl:  .  carboxymethylcellulose (REFRESH PLUS) 0.5 % SOLN, Place 1 drop into both eyes 3 (three) times daily as needed (dry eyes)., Disp: , Rfl:  .  cetirizine (ZYRTEC) 5 MG tablet, Take 5 mg by mouth 2 (two) times daily as needed for allergies. , Disp: , Rfl:  .  gabapentin (NEURONTIN) 300 MG capsule, Take 1 capsule (300 mg total) by mouth 5 (five) times daily. (Patient taking differently: Take 300 mg by mouth See admin instructions. Takes 1 q am, 1 in afternoon, 2 po hs.), Disp: 150 capsule, Rfl: 2 .  hydrochlorothiazide (HYDRODIURIL) 25 MG tablet, TAKE ONE TABLET BY MOUTH ONCE DAILY (Patient taking differently: Take 25 mg by mouth daily.), Disp: 90 tablet, Rfl: 0 .  Lancets (ONETOUCH DELICA PLUS [id number]) MISC, , Disp: , Rfl:  .  levothyroxine (SYNTHROID) 125 MCG tablet, Take 1 tablet (125 mcg total) by mouth daily before breakfast., Disp: 30 tablet, Rfl: 1 .  losartan (COZAAR) 100 MG tablet, Take 100 mg by mouth daily., Disp: , Rfl:  .  mupirocin ointment (BACTROBAN) 2 %, APPLY TO AFFECTED AREA(S) DAILY TO TWO TIMES A DAY, Disp: 22 g, Rfl: 2 .  naproxen sodium (ALEVE) 220 MG tablet, Take 220 mg by mouth daily as needed (pain)., Disp: , Rfl:  .  ONETOUCH VERIO test strip, 1 each daily., Disp: , Rfl:  .  rosuvastatin (CRESTOR) 20 MG tablet, Take 20 mg by mouth daily., Disp: , Rfl:  .  Semaglutide (RYBELSUS) 7 MG TABS, Take 7 mg by mouth daily., Disp: , Rfl:  .  empagliflozin (JARDIANCE) 10 MG TABS tablet, Take 10 mg by mouth daily. (Patient not taking: No sig reported), Disp: , Rfl:  .  lithium carbonate 300 MG  capsule, Take 3 capsules (900 mg total) by mouth at bedtime., Disp: 270 capsule, Rfl: 1 .  mometasone (ELOCON) 0.1 % lotion, Apply topically daily. (Patient not taking: Reported on 07/19/2020), Disp: 60 mL, Rfl: 10 .  predniSONE (DELTASONE) 10 MG tablet, Take 1 by mouth daily with food for 1 week. Then 1 tab every other day . (Patient not taking: No sig reported), Disp: 30 tablet, Rfl: 0 .  traMADol (ULTRAM) 50 MG tablet, TAKE 1 TABLET BY MOUTH EVERY 12 HOURS AS NEEDED (Patient not taking: No sig reported), Disp: 30 tablet, Rfl: 0 .  triamcinolone cream (KENALOG) 0.1 %, APPLY TO AFFECTED AREA(S) DAILY (Patient not taking: No sig reported), Disp: 30 g, Rfl: 2 Medication Side Effects: none  Family Medical/ Social History: Changes? no  MENTAL HEALTH EXAM:  Last menstrual period 07/30/2013.There is no height or weight on file to calculate BMI.  General Appearance: Bizarre, Well Groomed and Obese  Eye Contact:  Good  Speech:  Clear and Coherent and Normal Rate  Volume:  Normal  Mood:  Anxious  Affect:  Congruent, Full Range and Anxious  Thought Process:  Goal Directed and Descriptions of Associations: Circumstantial  Orientation:  Full (Time, Place, and Person)  Thought Content: Logical   Suicidal Thoughts:  No  Homicidal Thoughts:  No  Memory:  WNL  Judgement:  Good  Insight:  Good  Psychomotor Activity:  Normal  Concentration:  Concentration: Good  Recall:  Good  Fund of Knowledge: Good  Language: Good  Assets:  Desire for Improvement  ADL's:  Intact  Cognition: WNL  Prognosis:  Good   07/07/2020 ER studies:  Labs of significance to me: Lithium level 1.38 CBC White count 10.6, platelets 402, ANC 8.9, otherwise normal BUN 30, creatinine 0.92, alkaline phosphatase 169, the remainder of CMP was normal except for glucose at 126.  Has known diabetes. TSH 0.082 EKG was within normal limits. CT of head no acute intracranial process. CT abdomen and pelvis with contrast questionable wall  thickening of ascending colon, was recommended to have a colonoscopy. Minimal distal colonic diverticulosis without diverticulitis Uterine fibroids. Right adrenal adenoma unchanged from a previous scan. Small umbilical hernia. Degenerative changes in the lumbar spine with progression from 2013  DIAGNOSES:    ICD-10-CM   1. Bipolar I disorder ([place])  F31.9   2. 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 Encounter for long-term (current) use of medications  Z79.899 Lithium level    Comprehensive metabolic panel    TSH    Receiving Psychotherapy: No    RECOMMENDATIONS:  PDMP was reviewed. I provided 45 minutes of face-to-face time during this encounter, including time spent reviewing labs, CT scan results, ER notes.  Time also spent after visit in charting. Continue Synthroid 125 micrograms daily. Continue lithium 900 mg/day. Continue gabapentin 300 mg, 1 p.o. every morning, 1 p.o. at lunchtime, 2 p.o. nightly. Labs ordered as noted above.   She plans to have the lithium level drawn tomorrow, since her dose was decreased.  Of importance is that she usually takes the lithium around 1 PM.  That needs to be  taken into consideration when lithium results are back.  In approximately 4 to 5 weeks from now, she will have the TSH and CMP drawn.  She verbalizes understanding. Consider counseling. Return in 2 months.  Donnal Moat, PA-C

## 2020-07-20 ENCOUNTER — Other Ambulatory Visit: Payer: Self-pay | Admitting: Physician Assistant

## 2020-07-20 DIAGNOSIS — Z79899 Other long term (current) drug therapy: Secondary | ICD-10-CM

## 2020-07-21 ENCOUNTER — Telehealth: Payer: Self-pay | Admitting: Psychiatry

## 2020-07-21 NOTE — Telephone Encounter (Signed)
TC from [facility] RE: critical lithium level of 1.8.    TC to pt who reports SE are neglible at this time.  She reports that this lab was drawn approximately 24 hours after her last dose of lithium.  She reports no recent medication changes.  Current dose of lithium is 900 mg daily.  Informed patient is likely that the hydrochlorothiazide is elevating the lithium levels and it would be helpful if her primary care doctor, Dr. Nancy Fetter would be willing to change this to some other medication. In the short-term she was skipped the lithium today and then restart lithium tomorrow at 600 mg daily.  She needs to have a repeat lithium level in approximately a week or if her hydrochlorothiazide is changed a week after that med change occurs.  Lynder Parents, MD, DFAPA

## 2020-07-22 ENCOUNTER — Other Ambulatory Visit: Payer: Self-pay | Admitting: Physician Assistant

## 2020-07-22 DIAGNOSIS — Z79899 Other long term (current) drug therapy: Secondary | ICD-10-CM

## 2020-07-22 LAB — COMPREHENSIVE METABOLIC PANEL
ALT: 24 IU/L (ref 0–32)
AST: 25 IU/L (ref 0–40)
Albumin/Globulin Ratio: 2.3 — ABNORMAL HIGH (ref 1.2–2.2)
Albumin: 4.1 g/dL (ref 3.8–4.9)
Alkaline Phosphatase: 94 IU/L (ref 44–121)
BUN/Creatinine Ratio: 14 (ref 9–23)
BUN: 18 mg/dL (ref 6–24)
Bilirubin Total: <0.2 mg/dL (ref 0.0–1.2)
CO2: 26 mmol/L (ref 20–29)
Calcium: 10.1 mg/dL (ref 8.7–10.2)
Chloride: 101 mmol/L (ref 96–106)
Creatinine, Ser: 1.25 mg/dL — ABNORMAL HIGH (ref 0.57–1.00)
Globulin, Total: 1.8 g/dL (ref 1.5–4.5)
Glucose: 79 mg/dL (ref 65–99)
Potassium: 3.9 mmol/L (ref 3.5–5.2)
Sodium: 138 mmol/L (ref 134–144)
Total Protein: 5.9 g/dL — ABNORMAL LOW (ref 6.0–8.5)
eGFR: 50 mL/min/{1.73_m2} — ABNORMAL LOW (ref >59)

## 2020-07-22 LAB — LITHIUM LEVEL: Lithium Lvl: 1.8 mmol/L (ref 0.5–1.2)

## 2020-07-22 NOTE — Telephone Encounter (Signed)
Noted. Lithium level was ordered. See instructions on lab result.

## 2020-07-22 NOTE — Progress Notes (Signed)
Dr. Clovis Pu already took care of this critical lab. I ordered another Lithium level to be drawn the end of next week, or if the Hydrochlorothiazide has been changed, a week after that takes place.Please mail the lab order to her or ask her to pick it up. If the order didn't print, please ask Dr.Cottle to order since I'm out of the office this week. Thanks.

## 2020-07-23 NOTE — Progress Notes (Signed)
Baxter Flattery can you please mail this order

## 2020-07-24 ENCOUNTER — Telehealth: Payer: Self-pay | Admitting: Psychiatry

## 2020-07-24 DIAGNOSIS — F319 Bipolar disorder, unspecified: Secondary | ICD-10-CM

## 2020-07-24 NOTE — Progress Notes (Signed)
This one

## 2020-07-24 NOTE — Telephone Encounter (Signed)
Telephone call with Dr. Pershing Cox at [facility].  Discussed the difficulty managing lithium levels on HCTZ.  She will have the patient stop HCTZ and we will repeat the lithium level in a week. Order sent to [facility]  As previously noted over the weekend the patient had a lithium level of 1.8 and lithium was reduced from 900 mg a day to 600 mg daily.  It will be expected that off the HCTZ she is likely to need the lithium dose increased back up.

## 2020-07-29 NOTE — Telephone Encounter (Signed)
Thank you :)

## 2020-08-03 ENCOUNTER — Other Ambulatory Visit: Payer: Self-pay | Admitting: Family Medicine

## 2020-08-03 DIAGNOSIS — Z1231 Encounter for screening mammogram for malignant neoplasm of breast: Secondary | ICD-10-CM

## 2020-08-07 ENCOUNTER — Other Ambulatory Visit: Payer: Self-pay | Admitting: Physician Assistant

## 2020-08-07 DIAGNOSIS — Z79899 Other long term (current) drug therapy: Secondary | ICD-10-CM

## 2020-08-09 LAB — BASIC METABOLIC PANEL
BUN/Creatinine Ratio: 18 (ref 9–23)
BUN: 13 mg/dL (ref 6–24)
CO2: 22 mmol/L (ref 20–29)
Calcium: 10.1 mg/dL (ref 8.7–10.2)
Chloride: 108 mmol/L — ABNORMAL HIGH (ref 96–106)
Creatinine, Ser: 0.74 mg/dL (ref 0.57–1.00)
Glucose: 78 mg/dL (ref 65–99)
Potassium: 4.7 mmol/L (ref 3.5–5.2)
Sodium: 144 mmol/L (ref 134–144)
eGFR: 93 mL/min/{1.73_m2} (ref >59)

## 2020-08-09 LAB — LITHIUM LEVEL: Lithium Lvl: 0.6 mmol/L (ref 0.5–1.2)

## 2020-08-09 LAB — TSH: TSH: 1.04 u[IU]/mL (ref 0.450–4.500)

## 2020-08-09 NOTE — Progress Notes (Signed)
Please let her know that Lithium has decreased to 0.6, which is great! It is in the low normal range.  If she is feeling fine without increased depression or mood swings, have her stay on the current lithium dose.  If she is noticing any symptoms of depression we need to go back up on the lithium. Her kidney function tests are completely normal now.   Blood sugar has dropped to 78 which is also good.   Chloride level is 2 points too high but is not significant at this time.   TSH was 1.04.  That is in a good range, continue the same levothyroxine dose.

## 2020-08-10 ENCOUNTER — Other Ambulatory Visit: Payer: Self-pay

## 2020-08-10 MED ORDER — LEVOTHYROXINE SODIUM 125 MCG PO TABS: 125.0000 ug | ORAL_TABLET | Freq: Every day | ORAL | 0 refills | Status: DC

## 2020-08-10 NOTE — Progress Notes (Signed)
Pt informed

## 2020-08-17 ENCOUNTER — Telehealth: Payer: Self-pay | Admitting: Physician Assistant

## 2020-08-17 NOTE — Telephone Encounter (Signed)
Next visit is 09/20/20. Tamber went to see her PCP recently and they said her ankles, toes and under her knees were swelling noticeably. She was wearing compression hose but is unable to wear them now because of the swelling. Her PCP wanted her to call to inform of the swelling. She has been on HCTZ for a while. She is wondering if the HCTZ is having something to do with this. Her phone number is [phone]. Pharmacy is:  [facility] [address] [place] Phone:  [phone]  Fax:  [phone]

## 2020-08-17 NOTE — Telephone Encounter (Signed)
Please review

## 2020-08-18 ENCOUNTER — Other Ambulatory Visit: Payer: Self-pay | Admitting: Physician Assistant

## 2020-08-18 DIAGNOSIS — L281 Prurigo nodularis: Secondary | ICD-10-CM

## 2020-08-19 ENCOUNTER — Other Ambulatory Visit: Payer: Self-pay | Admitting: Physician Assistant

## 2020-08-19 MED ORDER — LITHIUM CARBONATE 300 MG PO CAPS: 600.0000 mg | ORAL_CAPSULE | Freq: Every day | ORAL | 1 refills | Status: DC

## 2020-08-19 NOTE — Telephone Encounter (Signed)
Please let patient know that I have sent a note to Dr. Nancy Fetter concerning the swelling.  There are other diuretics that can be used, and if she agrees, she will prescribe it and have someone in her office let Leah Powers know.  If Leah Powers has not heard anything from either of our offices by Wednesday or Thursday of this week, call to see what the verdict is.

## 2020-08-20 ENCOUNTER — Telehealth: Payer: Self-pay | Admitting: Physician Assistant

## 2020-08-20 DIAGNOSIS — L281 Prurigo nodularis: Secondary | ICD-10-CM

## 2020-08-20 NOTE — Telephone Encounter (Signed)
Pt wants to know when she should get lab done?Last lab was 08/08/20 and she has appt on 7/7.She has the order already

## 2020-08-20 NOTE — Telephone Encounter (Signed)
Patient left message on office voice mail that she has gotten a refill on Gabapentin, but thought at the last visit that [facility], PA-C told her to increase the dosage to from 3 pills to 4 pills.  The prescription for that she picked up is for 3 pills only. Also, patient needs a recommendation on what to use on her itchy scalp because nothing seems to be working.

## 2020-08-20 NOTE — Telephone Encounter (Signed)
Leah Powers please let her know that unless we increased the dose of lithium she does not need to have labs drawn again right away.  But if she is more depressed and we need to increase the lithium back up (go up to 900 mg) then I want her to have the lithium level drawn in 5 to 7 days afterwards.

## 2020-08-21 NOTE — Telephone Encounter (Signed)
Pt informed

## 2020-08-22 NOTE — Telephone Encounter (Signed)
reviewed

## 2020-08-22 NOTE — Telephone Encounter (Signed)
Please call pharmacy. Rx was written for 300 gabapentin daily. Need to ammend to 300 qam and 300 at lunch time. #60 1RF. Please ask pt to give status after a 2 weeks on how she is doing.

## 2020-08-23 MED ORDER — GABAPENTIN 300 MG PO CAPS: 300.0000 mg | ORAL_CAPSULE | Freq: Two times a day (BID) | ORAL | 1 refills | Status: DC

## 2020-08-23 MED ORDER — GABAPENTIN 300 MG PO CAPS: 300.0000 mg | ORAL_CAPSULE | Freq: Four times a day (QID) | ORAL | 2 refills | Status: DC

## 2020-08-23 NOTE — Addendum Note (Signed)
Addended by: Lennie Odor on: 08/23/2020 09:20 AM   Modules accepted: Orders

## 2020-08-23 NOTE — Telephone Encounter (Signed)
Phone call to patient to inform her that the new prescription for the Gabapentin has been sent to her Pharmacy with the new directions. Per patient she's been taking 1-3 pills a day, per patient she thought Vida Roller was increasing her to 4 pills a day. I spoke with Robyne Askew regarding this and she gave a verbal to change the patient's prescription to Gabapentin 300mg  4 pills a day #120 with 2 refills. Patient aware. Phone call to patient's Pharmacy to cancel the previous prescription and to inform them a new prescription was being sent to them. Pharmacy aware.

## 2020-09-20 ENCOUNTER — Ambulatory Visit: Payer: 59 | Admitting: Physician Assistant

## 2020-10-04 ENCOUNTER — Other Ambulatory Visit: Payer: Self-pay

## 2020-10-04 ENCOUNTER — Ambulatory Visit
Admission: RE | Admit: 2020-10-04 | Discharge: 2020-10-04 | Disposition: A | Discharge status: Home / Self Care | Payer: 59 | Source: Ambulatory Visit | Attending: Family Medicine | Admitting: Family Medicine

## 2020-10-04 DIAGNOSIS — Z1231 Encounter for screening mammogram for malignant neoplasm of breast: Secondary | ICD-10-CM

## 2020-10-17 ENCOUNTER — Telehealth: Payer: 59 | Admitting: Physician Assistant

## 2020-10-17 ENCOUNTER — Encounter: Payer: Self-pay | Admitting: Physician Assistant

## 2020-10-17 NOTE — Progress Notes (Signed)
We were unable to connect via caregility, she tried 4 different ways and it did not work.  Due to her insurance, we have to do a video visit or else she would have to pay out of pocket for the visit.  She will call back and reschedule.

## 2020-10-27 LAB — LITHIUM LEVEL: Lithium Lvl: 0.4 mmol/L — ABNORMAL LOW (ref 0.5–1.2)

## 2020-10-28 ENCOUNTER — Other Ambulatory Visit: Payer: Self-pay | Admitting: Physician Assistant

## 2020-10-28 DIAGNOSIS — F319 Bipolar disorder, unspecified: Secondary | ICD-10-CM

## 2020-10-28 DIAGNOSIS — Z79899 Other long term (current) drug therapy: Secondary | ICD-10-CM

## 2020-10-28 NOTE — Progress Notes (Signed)
Please confirm her Lithium dose. Should be 600 mg. Li level is low so need to increase level a total of 900 mg.  I am aware that her level was too high on the higher dose of lithium, but now that she is off the hydrochlorothiazide it should not be as high as it was while on it.  We will check lithium level in 7 days, which is very important.  Please mail the lab order to her.  Thanks.

## 2020-10-29 ENCOUNTER — Other Ambulatory Visit: Payer: Self-pay | Admitting: Physician Assistant

## 2020-10-29 DIAGNOSIS — Z79899 Other long term (current) drug therapy: Secondary | ICD-10-CM

## 2020-10-29 DIAGNOSIS — F319 Bipolar disorder, unspecified: Secondary | ICD-10-CM

## 2020-10-29 NOTE — Progress Notes (Signed)
I ordered it on 10/28/2020, but guess it didn't print, so re-ordered just now. Thanks.

## 2020-10-29 NOTE — Progress Notes (Signed)
Pt stated yes she is taking 600 mg and she would like the order to be printed so that she can pick it up.I will ask beth to print this for me.

## 2020-11-08 LAB — LITHIUM LEVEL: Lithium Lvl: 0.8 mmol/L (ref 0.5–1.2)

## 2020-11-08 NOTE — Progress Notes (Signed)
Li level is better. No change in treatment. At our next appt, will decide when next level is needed.

## 2020-11-08 NOTE — Progress Notes (Signed)
Pt informed

## 2020-11-26 ENCOUNTER — Other Ambulatory Visit: Payer: Self-pay | Admitting: *Deleted

## 2020-11-26 DIAGNOSIS — L281 Prurigo nodularis: Secondary | ICD-10-CM

## 2020-11-26 MED ORDER — GABAPENTIN 300 MG PO CAPS: 300.0000 mg | ORAL_CAPSULE | Freq: Four times a day (QID) | ORAL | 0 refills | Status: DC

## 2020-11-26 NOTE — Addendum Note (Signed)
Addended by: Zenia Resides on: 11/26/2020 12:40 PM   Modules accepted: Orders

## 2020-11-26 NOTE — Telephone Encounter (Signed)
Patient will be completely out of medicine (gabapentin) in a week- I informed patient that I would ask kelli to give her medication to cover her so that she doesn't run out of medicine. She already has appointment scheduled on 12/12/2020.

## 2020-11-26 NOTE — Telephone Encounter (Signed)
Received refill for Gabapentin - called and left message for patient to inform her that no more refills will be given until after her 12/12/20 office visit.

## 2020-11-27 ENCOUNTER — Other Ambulatory Visit: Payer: Self-pay | Admitting: Physician Assistant

## 2020-11-27 NOTE — Telephone Encounter (Signed)
Pt called and said she has increased to 3 lithium tablets a day. Please send in a new script

## 2020-11-30 ENCOUNTER — Other Ambulatory Visit: Payer: Self-pay

## 2020-11-30 MED ORDER — LITHIUM CARBONATE 300 MG PO CAPS: 900.0000 mg | ORAL_CAPSULE | Freq: Every day | ORAL | 1 refills | Status: DC

## 2020-12-07 ENCOUNTER — Ambulatory Visit: Payer: 59 | Admitting: Physician Assistant

## 2020-12-11 ENCOUNTER — Other Ambulatory Visit: Payer: Self-pay

## 2020-12-11 ENCOUNTER — Encounter: Payer: Self-pay | Admitting: Physician Assistant

## 2020-12-11 ENCOUNTER — Ambulatory Visit (INDEPENDENT_AMBULATORY_CARE_PROVIDER_SITE_OTHER): Payer: 59 | Admitting: Physician Assistant

## 2020-12-11 DIAGNOSIS — E032 Hypothyroidism due to medicaments and other exogenous substances: Secondary | ICD-10-CM

## 2020-12-11 DIAGNOSIS — Z79899 Other long term (current) drug therapy: Secondary | ICD-10-CM | POA: Diagnosis not present

## 2020-12-11 DIAGNOSIS — F319 Bipolar disorder, unspecified: Secondary | ICD-10-CM

## 2020-12-11 NOTE — Progress Notes (Signed)
[facility]  Patient ID: Leah Powers,  MRN: [id number]  PCP: Donald Prose, MD  Date of Evaluation: 12/11/2020 Time spent:40 minutes  Chief Complaint:  Chief Complaint   Anxiety; Depression; Follow-up     HISTORY/CURRENT STATUS: HPI for routine med check.  Since LOV in May, had Li toxicity, was taken off HCTZ, Li level decreased to nl level. After she'd been off HCTZ, Li level dropped to subtherapuetic level, so dose was increased. See labs as below.  Pt feels well. Patient denies loss of interest in usual activities and is able to enjoy things.  Denies decreased energy or motivation.  Appetite has not changed.  No extreme sadness, tearfulness, or feelings of hopelessness.  Denies any changes in concentration, making decisions or remembering things.  Sleeps well, not working, no anxiety. Denies suicidal or homicidal thoughts.  Patient denies increased energy with decreased need for sleep, no increased talkativeness, no racing thoughts, no impulsivity or risky behaviors, no increased spending, no decreased libido, no grandiosity, no increased irritability or anger, no paranoia, and no hallucinations.  Denies dizziness, syncope, seizures, numbness, tingling, tremor, tics, unsteady gait, slurred speech, confusion. Denies muscle or joint pain, stiffness, or dystonia.  Individual Medical History/ Review of Systems: Changes? :Yes   see previous phone notes and lab results.  Past medications for mental health diagnoses include: Prolixin, Haldol, Ativan, lithium, Xanax, Synthroid, gabapentin  Allergies: Eucalyptus oil, Haldol [haloperidol decanoate], Molds & smuts, Other, Penicillins, Bee venom, Corn-containing products, Demeclocycline, Dolobid [diflunisal], Metformin and related, Vicodin [hydrocodone-acetaminophen], Doxycycline, Erythromycin, Keflex [cephalexin], Macrodantin, Motrin [ibuprofen], Septra [bactrim], Tetracyclines & related, and Tofranil-pm  Current Medications:   Current Outpatient Medications:    albuterol (VENTOLIN HFA) 108 (90 Base) MCG/ACT inhaler, Inhale 2 puffs into the lungs every 6 (six) hours as needed for wheezing or shortness of breath., Disp: , Rfl:    carboxymethylcellulose (REFRESH PLUS) 0.5 % SOLN, Place 1 drop into both eyes 3 (three) times daily as needed (dry eyes)., Disp: , Rfl:    cetirizine (ZYRTEC) 5 MG tablet, Take 5 mg by mouth 2 (two) times daily as needed for allergies. , Disp: , Rfl:    gabapentin (NEURONTIN) 300 MG capsule, Take 1 capsule (300 mg total) by mouth 4 (four) times daily. (Patient taking differently: Take 300 mg by mouth. 2 po q am, 1 po midday, 2 po q hs), Disp: 120 capsule, Rfl: 0   Lancets (ONETOUCH DELICA PLUS [id number]) MISC, , Disp: , Rfl:    levothyroxine (SYNTHROID) 125 MCG tablet, TAKE ONE TABLET BY MOUTH DAILY BEFORE BREAKFAST, Disp: 30 tablet, Rfl: 0   lithium carbonate 300 MG capsule, Take 3 capsules (900 mg total) by mouth at bedtime., Disp: 270 capsule, Rfl: 1   mupirocin ointment (BACTROBAN) 2 %, APPLY TO AFFECTED AREA(S) DAILY TO TWO TIMES A DAY, Disp: 22 g, Rfl: 2   naproxen sodium (ALEVE) 220 MG tablet, Take 220 mg by mouth daily as needed (pain)., Disp: , Rfl:    ONETOUCH VERIO test strip, 1 each daily., Disp: , Rfl:    rosuvastatin (CRESTOR) 20 MG tablet, Take 20 mg by mouth daily., Disp: , Rfl:    Semaglutide (RYBELSUS) 7 MG TABS, Take 7 mg by mouth daily., Disp: , Rfl:    empagliflozin (JARDIANCE) 10 MG TABS tablet, Take 10 mg by mouth daily. (Patient not taking: No sig reported), Disp: , Rfl:    furosemide (LASIX) 20 MG tablet, Take 20 mg by mouth daily., Disp: , Rfl:  hydrochlorothiazide (HYDRODIURIL) 25 MG tablet, TAKE ONE TABLET BY MOUTH ONCE DAILY (Patient taking differently: Take 25 mg by mouth daily.), Disp: 90 tablet, Rfl: 0   losartan (COZAAR) 100 MG tablet, Take 100 mg by mouth daily., Disp: , Rfl:    mometasone (ELOCON) 0.1 % lotion, Apply topically daily. (Patient not taking:  Reported on 07/19/2020), Disp: 60 mL, Rfl: 10   predniSONE (DELTASONE) 10 MG tablet, Take 1 by mouth daily with food for 1 week. Then 1 tab every other day . (Patient not taking: No sig reported), Disp: 30 tablet, Rfl: 0   traMADol (ULTRAM) 50 MG tablet, TAKE 1 TABLET BY MOUTH EVERY 12 HOURS AS NEEDED (Patient not taking: No sig reported), Disp: 30 tablet, Rfl: 0   triamcinolone cream (KENALOG) 0.1 %, APPLY TO AFFECTED AREA(S) DAILY (Patient not taking: No sig reported), Disp: 30 g, Rfl: 2 Medication Side Effects: none  Family Medical/ Social History: Changes? She and husband sold their home. Living in apartment while looking for house or [place].   MENTAL HEALTH EXAM:  Last menstrual period 07/30/2013.There is no height or weight on file to calculate BMI.  General Appearance: Casual, Well Groomed, and Obese  Eye Contact:  Good  Speech:  Clear and Coherent and Normal Rate  Volume:  Normal  Mood:  Euthymic  Affect:  Congruent and Full Range  Thought Process:  Goal Directed and Descriptions of Associations: Circumstantial  Orientation:  Full (Time, Place, and Person)  Thought Content: Logical   Suicidal Thoughts:  No  Homicidal Thoughts:  No  Memory:  WNL  Judgement:  Good  Insight:  Good  Psychomotor Activity:  Normal  Concentration:  Concentration: Good  Recall:  Good  Fund of Knowledge: Good  Language: Good  Assets:  Desire for Improvement  ADL's:  Intact  Cognition: WNL  Prognosis:  Good   01/25/20 Lithium 0.9  07/07/20 Lithium 1.38 TSH 0.082    07/20/20 Lithium 1.8  08/08/2020 Lithium 0.6 TSH 1.04 BUN/CR 13/0.74  10/26/2020 Lithium 0.4  11/07/2020 Lithium 0.8   DIAGNOSES:    ICD-10-CM   1. Bipolar I disorder ([facility])  F31.9 Lithium level    2. 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 Encounter for long-term (current) use of medications  Z79.899 TSH    Lithium level      Receiving Psychotherapy: No    RECOMMENDATIONS:  PDMP was reviewed.  No  results available. I provided 40 minutes of face to face time during this encounter, including time spent before and after the visit in records review, medical decision making, and charting.  She's feeling well with current meds, no changes are necessary.  Long discussion concerning Lithium toxicity in late spring. Will monitor more often for the next 6-12 months, but don't expect any major changes in values.  Also hypothyroidism discussed. Last Synthroid dose change was 4/25, repeat TSH on 5/24 wnl.  Continue Synthroid 125 micrograms daily. Continue lithium 900 mg/day. Continue gabapentin 300 mg,  2 po q am, 1 mid-day, 2 po qhs. Labs ordered as above.  Return in 4 months.  Donnal Moat, PA-C

## 2020-12-12 ENCOUNTER — Ambulatory Visit: Payer: 59 | Admitting: Physician Assistant

## 2020-12-26 ENCOUNTER — Other Ambulatory Visit: Payer: Self-pay | Admitting: Physician Assistant

## 2020-12-26 LAB — LITHIUM LEVEL: Lithium Lvl: 0.7 mmol/L (ref 0.5–1.2)

## 2020-12-26 LAB — TSH: TSH: 1.91 u[IU]/mL (ref 0.450–4.500)

## 2020-12-26 MED ORDER — LEVOTHYROXINE SODIUM 125 MCG PO TABS: ORAL_TABLET | ORAL | 1 refills | Status: DC

## 2020-12-26 NOTE — Progress Notes (Signed)
P is asking for a refill of levothyroxine do you prescribe this?

## 2020-12-26 NOTE — Progress Notes (Signed)
Please let her know that her labs are good.  Continue the same doses of lithium and levothyroxine (Synthroid.)  Thank you.

## 2020-12-26 NOTE — Progress Notes (Signed)
Pt needs a 90 day supply

## 2020-12-26 NOTE — Progress Notes (Signed)
Yes I do because the lithium has caused the hypothyroidism.  Last RF I only gave her enough for a month, I wanted to wait until TSH results were back.  Please see if she wants a 90-day supply, or 30 with refills.  She can have enough for a total of 6 months.  Thanks.

## 2020-12-27 ENCOUNTER — Other Ambulatory Visit: Payer: Self-pay

## 2020-12-27 ENCOUNTER — Ambulatory Visit (INDEPENDENT_AMBULATORY_CARE_PROVIDER_SITE_OTHER): Payer: 59 | Admitting: Physician Assistant

## 2020-12-27 ENCOUNTER — Encounter: Payer: Self-pay | Admitting: Physician Assistant

## 2020-12-27 DIAGNOSIS — R202 Paresthesia of skin: Secondary | ICD-10-CM

## 2020-12-27 MED ORDER — GABAPENTIN 300 MG PO CAPS: ORAL_CAPSULE | ORAL | 4 refills | Status: DC

## 2020-12-28 NOTE — Progress Notes (Signed)
   Follow-Up Visit   Subjective  Leah Powers is a 60 y.o. 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 She is doing really well and feels like it makes her much calmer. No adverse side effects or problems with fatigue.   The following portions of the chart were reviewed this encounter and updated as appropriate:  Tobacco  Allergies  Meds  Problems  Med Hx  Surg Hx  Fam Hx      Objective  Well appearing patient in no apparent distress; mood and affect are within normal limits.  All skin waist up examined.  Torso - Posterior (Back) Completely healed with numerous deep hypopigmented scars.No atypical nevi. No signs of non-mole skin cancer.       Assessment & Plan  Notalgia paresthetica Torso - Posterior (Back)  gabapentin (NEURONTIN) 300 MG capsule - Torso - Posterior (Back) Take as directed (300mg  5 times a day)  Related Medications mupirocin ointment (BACTROBAN) 2 % APPLY TO AFFECTED AREA(S) DAILY TO TWO TIMES A DAY    I, Meerab Maselli, PA-C, have reviewed all documentation's for this visit.  The documentation on 12/28/20 for the exam, diagnosis, procedures and orders are all accurate and complete.

## 2021-04-01 ENCOUNTER — Ambulatory Visit (INDEPENDENT_AMBULATORY_CARE_PROVIDER_SITE_OTHER): Payer: Managed Care, Other (non HMO) | Admitting: Physician Assistant

## 2021-04-01 ENCOUNTER — Other Ambulatory Visit: Payer: Self-pay | Admitting: Physician Assistant

## 2021-04-01 ENCOUNTER — Other Ambulatory Visit: Payer: Self-pay

## 2021-04-01 ENCOUNTER — Encounter: Payer: Self-pay | Admitting: Physician Assistant

## 2021-04-01 VITALS — BP 131/79 | HR 76 | Temp 97.6°F | Ht 67.5 in | Wt 235.2 lb

## 2021-04-01 DIAGNOSIS — R600 Localized edema: Secondary | ICD-10-CM

## 2021-04-01 DIAGNOSIS — E119 Type 2 diabetes mellitus without complications: Secondary | ICD-10-CM

## 2021-04-01 DIAGNOSIS — E1169 Type 2 diabetes mellitus with other specified complication: Secondary | ICD-10-CM | POA: Diagnosis not present

## 2021-04-01 DIAGNOSIS — I10 Essential (primary) hypertension: Secondary | ICD-10-CM | POA: Diagnosis not present

## 2021-04-01 DIAGNOSIS — E785 Hyperlipidemia, unspecified: Secondary | ICD-10-CM

## 2021-04-01 MED ORDER — ROSUVASTATIN CALCIUM 20 MG PO TABS: 20.0000 mg | ORAL_TABLET | Freq: Every day | ORAL | 1 refills | Status: DC

## 2021-04-01 MED ORDER — ONETOUCH VERIO VI STRP: 1.0000 | ORAL_STRIP | Freq: Every day | 3 refills | Status: DC

## 2021-04-01 MED ORDER — ALBUTEROL SULFATE HFA 108 (90 BASE) MCG/ACT IN AERS: 2.0000 | INHALATION_SPRAY | Freq: Four times a day (QID) | RESPIRATORY_TRACT | 2 refills | Status: DC | PRN

## 2021-04-01 MED ORDER — ONETOUCH DELICA PLUS LANCET30G MISC: 1.0000 | Freq: Every day | 2 refills | Status: AC

## 2021-04-01 MED ORDER — MEDICAL COMPRESSION SOCKS MISC: 0 refills | Status: AC

## 2021-04-01 MED ORDER — MEDICAL COMPRESSION SOCKS MISC: 0 refills | Status: DC

## 2021-04-01 MED ORDER — FUROSEMIDE 20 MG PO TABS: 20.0000 mg | ORAL_TABLET | Freq: Every day | ORAL | 1 refills | Status: DC

## 2021-04-01 MED ORDER — LOSARTAN POTASSIUM 50 MG PO TABS: 50.0000 mg | ORAL_TABLET | Freq: Every day | ORAL | 2 refills | Status: DC

## 2021-04-01 MED ORDER — RYBELSUS 7 MG PO TABS: 7.0000 mg | ORAL_TABLET | Freq: Every day | ORAL | 1 refills | Status: DC

## 2021-04-01 NOTE — Progress Notes (Signed)
Subjective:    Patient ID: Leah Powers, female    DOB: 1960-10-25, 61 y.o.   MRN: [id number]  No chief complaint on file.   HPI 61 y.o. patient presents today for new patient establishment with me.  Patient was previously established with Dr. Nancy Fetter with [facility].  Current Care Team: Donnal Moat, [facility] Dr. Sharol Given - pain in [facility] - Dr. Willette Pa   Acute Concerns: -Med refills - Rybelsus, Crestor, OneTouch test strips and lancets, Losartan, Lasix  -BP has been running 90s-100 or 110/60s at home. Some dizziness with change in position. Taking Losartan 100 mg daily and thinks dose needs to be lowered.   Chronic Concerns: See PMH listed below, as well as A/P for details on issues we specifically discussed during today's visit.      Past Medical History:  Diagnosis Date   Allergy    Arthritis    L hand, R ankle  (10/08/2016)   Bipolar 1 disorder ([place])    Depression    Diabetes mellitus without complication ([place])    Fibromyalgia    told that the breakout on the upper back , could be over active nerves , also treated with cream & Gabapentin- [facility] [place]    History of kidney stones    spontaneous passing   Hypertension    Hypothyroid    Pneumonia ~ 1967; 07/2016   Pyelonephritis 1997   treated with IV antibiotic     Past Surgical History:  Procedure Laterality Date   BLADDER INSTILLATION  X 2   CHALAZION EXCISION Bilateral    R- eye X2 , L eye- X1   FOOT ARTHRODESIS Right 10/08/2016   Procedure: RIGHT TRIPLE ARTHRODESIS;  Surgeon: Newt Minion, MD;  Location: [place];  Service: Orthopedics;  Laterality: Right;   FOOT ARTHRODESIS, TRIPLE Right 10/08/2016   ankle   FRENULECTOMY, LINGUAL  1970s   LAPAROSCOPIC CHOLECYSTECTOMY  1996   PALATE SURGERY  1970s   tissue removed from upper palate as a child    URETHRAL DILATION      Family History  Problem Relation Age of Onset   Breast cancer Maternal Aunt 61   Breast  cancer Maternal Grandmother 55   Sleep apnea Father    Hypertension Father    Diabetes Sister    Hypertension Sister    Asthma Sister    Cancer Paternal Grandfather     Social History   Tobacco Use   Smoking status: Never   Smokeless tobacco: Never  Vaping Use   Vaping Use: Never used  Substance Use Topics   Alcohol use: No   Drug use: No     Allergies  Allergen Reactions   Eucalyptus Oil Shortness Of Breath   Haldol [Haloperidol Decanoate] Other (See Comments)    Drooling, weaving in floor, parkinson's syndrome (couldn't eat or talk) , pt was hospitalized    Molds & Smuts Shortness Of Breath and Itching   Other Anaphylaxis, Shortness Of Breath, Itching and Swelling    Bleu cheese    Penicillins Anaphylaxis and Rash    Pt was 61 years old  Has patient had a PCN reaction causing immediate rash, facial/tongue/throat swelling, SOB or lightheadedness with hypotension: Yes Has patient had a PCN reaction causing severe rash involving mucus membranes or skin necrosis: Unknown Has patient had a PCN reaction that required hospitalization: No Has patient had a PCN reaction occurring within the last 10 years: No If all of the  above answers are "NO", then may proceed with Cephalosporin use.    Bee Venom Other (See Comments)    Yellow jackets and wasps-stung by 72, MD told pt that she wouldn't have resistance if stung again    Corn-Containing Products Nausea And Vomiting and Other (See Comments)    Stomach distress    Demeclocycline Other (See Comments)   Dolobid [Diflunisal] Other (See Comments)    Upset stomach   Metformin And Related    Vicodin [Hydrocodone-Acetaminophen] Diarrhea and Other (See Comments)    Upset stomach   Doxycycline Rash   Erythromycin Rash   Keflex [Cephalexin] Rash   Macrodantin Rash   Motrin [Ibuprofen] Palpitations   Septra [Bactrim] Rash   Tetracyclines & Related Rash   Tofranil-Pm Rash    Review of Systems NEGATIVE UNLESS OTHERWISE INDICATED IN  HPI      Objective:     BP 131/79    Pulse 76    Temp 97.6 F (36.4 C)    Ht 5' 7.5" (1.715 m)    Wt 235 lb 4 oz (106.7 kg)    LMP 07/30/2013    SpO2 98%    BMI 36.30 kg/m   Wt Readings from Last 3 Encounters:  04/01/21 235 lb 4 oz (106.7 kg)  10/26/19 254 lb 8 oz (115.4 kg)  07/11/19 258 lb 8 oz (117.3 kg)    BP Readings from Last 3 Encounters:  04/01/21 131/79  07/07/20 (!) 129/58  10/26/19 126/72     Physical Exam Vitals and nursing note reviewed.  Constitutional:      Appearance: Normal appearance. She is obese. She is not ill-appearing or toxic-appearing.  HENT:     Head: Normocephalic and atraumatic.     Right Ear: Tympanic membrane, ear canal and external ear normal.     Left Ear: Tympanic membrane, ear canal and external ear normal.     Nose: Nose normal.     Mouth/Throat:     Mouth: Mucous membranes are moist.  Eyes:     Extraocular Movements: Extraocular movements intact.     Conjunctiva/sclera: Conjunctivae normal.     Pupils: Pupils are equal, round, and reactive to light.  Cardiovascular:     Rate and Rhythm: Normal rate and regular rhythm.     Pulses: Normal pulses.     Heart sounds: Normal heart sounds.  Pulmonary:     Effort: Pulmonary effort is normal.     Breath sounds: Normal breath sounds.  Abdominal:     General: Abdomen is flat. Bowel sounds are normal.     Palpations: Abdomen is soft.  Musculoskeletal:        General: Normal range of motion.     Cervical back: Normal range of motion and neck supple.  Skin:    General: Skin is warm and dry.  Neurological:     General: No focal deficit present.     Mental Status: She is alert and oriented to person, place, and time.  Psychiatric:        Mood and Affect: Mood normal.        Behavior: Behavior normal.        Thought Content: Thought content normal.        Judgment: Judgment normal.       Assessment & Plan:   Problem List Items Addressed This Visit       Endocrine   DM2 (diabetes  mellitus, type 2) ([place]) - Primary (Chronic)   Relevant Medications   losartan (  COZAAR) 50 MG tablet   Semaglutide (RYBELSUS) 7 MG TABS   rosuvastatin (CRESTOR) 20 MG tablet   Other Visit Diagnoses     Essential hypertension       Relevant Medications   losartan (COZAAR) 50 MG tablet   rosuvastatin (CRESTOR) 20 MG tablet   furosemide (LASIX) 20 MG tablet   Hyperlipidemia associated with type 2 diabetes mellitus (HCC)       Relevant Medications   losartan (COZAAR) 50 MG tablet   Semaglutide (RYBELSUS) 7 MG TABS   rosuvastatin (CRESTOR) 20 MG tablet   furosemide (LASIX) 20 MG tablet   Fluid retention in legs            Meds ordered this encounter  Medications   losartan (COZAAR) 50 MG tablet    Sig: Take 1 tablet (50 mg total) by mouth daily.    Dispense:  30 tablet    Refill:  2   Semaglutide (RYBELSUS) 7 MG TABS    Sig: Take 7 mg by mouth daily.    Dispense:  90 tablet    Refill:  1   rosuvastatin (CRESTOR) 20 MG tablet    Sig: Take 1 tablet (20 mg total) by mouth daily.    Dispense:  90 tablet    Refill:  1   furosemide (LASIX) 20 MG tablet    Sig: Take 1 tablet (20 mg total) by mouth daily.    Dispense:  90 tablet    Refill:  1   Lancets (ONETOUCH DELICA PLUS [id number]) MISC    Sig: Inject 1 Stick into the skin daily.    Dispense:  100 each    Refill:  2   ONETOUCH VERIO test strip    Sig: 1 each by Other route daily.    Dispense:  100 each    Refill:  3   Plan -New pt establishment, history reviewed with patient -Refills sent as requested -Discussion about BP - agreed to have her half her dose to 50 mg daily and track at home, call if any problems. Goal BP 120-130/80. Recheck at well woman exam. -Schedule for well woman in the next few months. -Call if concerns prn   This note was prepared with assistance of Dragon voice recognition software. Occasional wrong-word or sound-a-like substitutions may have occurred due to the inherent limitations of voice  recognition software.  Time Spent: 31 minutes of total time was spent on the date of the encounter performing the following actions: chart review prior to seeing the patient, obtaining history, performing a medically necessary exam, counseling on the treatment plan, placing orders, and documenting in our EHR.    Bernabe Dorce M Laban Orourke, PA-C

## 2021-04-01 NOTE — Patient Instructions (Addendum)
Start taking only Losartan 50 mg daily Monitor BP at home Low salt diet Walking is important!  Refills sent today.  See you back for well woman.

## 2021-04-02 ENCOUNTER — Ambulatory Visit (INDEPENDENT_AMBULATORY_CARE_PROVIDER_SITE_OTHER): Payer: 59 | Admitting: Physician Assistant

## 2021-04-02 ENCOUNTER — Encounter: Payer: Self-pay | Admitting: Physician Assistant

## 2021-04-02 DIAGNOSIS — Z79899 Other long term (current) drug therapy: Secondary | ICD-10-CM

## 2021-04-02 DIAGNOSIS — F319 Bipolar disorder, unspecified: Secondary | ICD-10-CM

## 2021-04-02 DIAGNOSIS — E032 Hypothyroidism due to medicaments and other exogenous substances: Secondary | ICD-10-CM | POA: Diagnosis not present

## 2021-04-02 MED ORDER — LEVOTHYROXINE SODIUM 125 MCG PO TABS: ORAL_TABLET | ORAL | 1 refills | Status: DC

## 2021-04-02 MED ORDER — LITHIUM CARBONATE 300 MG PO CAPS: 900.0000 mg | ORAL_CAPSULE | Freq: Every day | ORAL | 1 refills | Status: DC

## 2021-04-02 NOTE — Progress Notes (Signed)
[facility]  Patient ID: Leah Powers,  MRN: [id number]  PCP: Fredirick Lathe, PA-C  Date of Evaluation: 04/02/2021 Time spent:30 minutes  Chief Complaint:  Chief Complaint   Depression; Follow-up      HISTORY/CURRENT STATUS: HPI for routine med check.  Doing well. Feels like her meds are working fine. Has moved since the last visit, so that was somewhat stressful, but in a good way. Is taking the Lasix and that's still helping the swelling.   Patient denies loss of interest in usual activities and is able to enjoy things.  Denies decreased energy or motivation.  Appetite has not changed.  No extreme sadness, tearfulness, or feelings of hopelessness.  Denies any changes in concentration, making decisions or remembering things.  Denies suicidal or homicidal thoughts.  Patient denies increased energy with decreased need for sleep, no increased talkativeness, no racing thoughts, no impulsivity or risky behaviors, no increased spending, no decreased libido, no grandiosity, no increased irritability or anger, no paranoia, and no hallucinations.  Denies dizziness, syncope, seizures, numbness, tingling, tremor, tics, unsteady gait, slurred speech, confusion. Denies muscle or joint pain, stiffness, or dystonia.  Individual Medical History/ Review of Systems: Changes? :No     Past medications for mental health diagnoses include: Prolixin, Haldol, Ativan, lithium, Xanax, Synthroid, gabapentin  Allergies: Eucalyptus oil, Haldol [haloperidol decanoate], Molds & smuts, Other, Penicillins, Bee venom, Corn-containing products, Demeclocycline, Dolobid [diflunisal], Metformin and related, Vicodin [hydrocodone-acetaminophen], Doxycycline, Erythromycin, Keflex [cephalexin], Macrodantin, Motrin [ibuprofen], Septra [bactrim], Tetracyclines & related, and Tofranil-pm  Current Medications:  Current Outpatient Medications:    albuterol (VENTOLIN HFA) 108 (90 Base) MCG/ACT inhaler, Inhale  2 puffs into the lungs every 6 (six) hours as needed for wheezing or shortness of breath., Disp: 1 each, Rfl: 2   cetirizine (ZYRTEC) 5 MG tablet, Take 5 mg by mouth 2 (two) times daily as needed for allergies. , Disp: , Rfl:    Elastic Bandages & Supports (MEDICAL COMPRESSION SOCKS) MISC, Wear compression stockings daily with upright activity. Remove at bedtime., Disp: 2 each, Rfl: 0   furosemide (LASIX) 20 MG tablet, Take 1 tablet (20 mg total) by mouth daily., Disp: 90 tablet, Rfl: 1   gabapentin (NEURONTIN) 300 MG capsule, Take as directed (300mg  5 times a day), Disp: 450 capsule, Rfl: 4   Lancets (ONETOUCH DELICA PLUS [id number]) MISC, Inject 1 Stick into the skin daily., Disp: 100 each, Rfl: 2   losartan (COZAAR) 50 MG tablet, Take 1 tablet (50 mg total) by mouth daily., Disp: 30 tablet, Rfl: 2   mupirocin ointment (BACTROBAN) 2 %, APPLY TO AFFECTED AREA(S) DAILY TO TWO TIMES A DAY, Disp: 22 g, Rfl: 2   naproxen sodium (ALEVE) 220 MG tablet, Take 220 mg by mouth daily as needed (pain)., Disp: , Rfl:    ONETOUCH VERIO test strip, 1 each by Other route daily., Disp: 100 each, Rfl: 3   rosuvastatin (CRESTOR) 20 MG tablet, Take 1 tablet (20 mg total) by mouth daily., Disp: 90 tablet, Rfl: 1   Semaglutide (RYBELSUS) 7 MG TABS, Take 7 mg by mouth daily., Disp: 90 tablet, Rfl: 1   carboxymethylcellulose (REFRESH PLUS) 0.5 % SOLN, Place 1 drop into both eyes 3 (three) times daily as needed (dry eyes)., Disp: , Rfl:    levothyroxine (SYNTHROID) 125 MCG tablet, TAKE ONE TABLET BY MOUTH DAILY BEFORE BREAKFAST, Disp: 90 tablet, Rfl: 1   lithium carbonate 300 MG capsule, Take 3 capsules (900 mg total) by mouth at bedtime., Disp:  270 capsule, Rfl: 1 Medication Side Effects: none  Family Medical/ Social History: Changes? She and husband sold their home. Bought a condo and moved.   MENTAL HEALTH EXAM:  Last menstrual period 07/30/2013.There is no height or weight on file to calculate BMI.  General  Appearance: Casual, Well Groomed, and Obese  Eye Contact:  Good  Speech:  Clear and Coherent, Normal Rate, Talkative, and giggly, which is her normal.  Volume:  Normal  Mood:  Euthymic  Affect:  Congruent and Full Range  Thought Process:  Goal Directed and Descriptions of Associations: Circumstantial  Orientation:  Full (Time, Place, and Person)  Thought Content: Logical   Suicidal Thoughts:  No  Homicidal Thoughts:  No  Memory:  WNL  Judgement:  Good  Insight:  Good  Psychomotor Activity:  Normal  Concentration:  Concentration: Good  Recall:  Good  Fund of Knowledge: Good  Language: Good  Assets:  Desire for Improvement  ADL's:  Intact  Cognition: WNL  Prognosis:  Good   12/25/2020  TSH was 1.9 Lithium level 0.7   DIAGNOSES:    ICD-10-CM   1. Bipolar I disorder ([place])  F31.9 CBC with Differential/Platelet    Comprehensive metabolic panel    Lithium level    TSH    2. Hypothyroidism due to medication  E03.2     3. Encounter for long-term (current) use of medications  Z79.899 CBC with Differential/Platelet    Comprehensive metabolic panel    Lithium level    TSH       Receiving Psychotherapy: No    RECOMMENDATIONS:  PDMP was reviewed.  No results available. I provided 30 minutes of face to face time during this encounter, including time spent before and after the visit in records review, medical decision making, counseling pertinent to today's visit, and charting.  Discussed her labs with her.  She is doing well so no changes in meds will be made. Due to her lithium toxicity last year and need for diuretic, I will check labs every 3 months and if she should feel bad in any way in between visits she can call and I will order a lithium level then. Continue Synthroid 125 micrograms daily. Continue lithium 900 mg/day. Continue gabapentin 300 mg,  2 po q am, 1 mid-day, 2 po qhs. Labs ordered as above.  Return in 3 months.  Donnal Moat, PA-C

## 2021-04-05 ENCOUNTER — Other Ambulatory Visit: Payer: Self-pay | Admitting: Physician Assistant

## 2021-04-07 LAB — CBC WITH DIFFERENTIAL/PLATELET
Basophils Absolute: 0.1 10*3/uL (ref 0.0–0.2)
Basos: 1 %
EOS (ABSOLUTE): 0.5 10*3/uL — ABNORMAL HIGH (ref 0.0–0.4)
Eos: 5 %
Hematocrit: 36.3 % (ref 34.0–46.6)
Hemoglobin: 11.2 g/dL (ref 11.1–15.9)
Immature Grans (Abs): 0 10*3/uL (ref 0.0–0.1)
Immature Granulocytes: 0 %
Lymphocytes Absolute: 1.6 10*3/uL (ref 0.7–3.1)
Lymphs: 15 %
MCH: 26 pg — ABNORMAL LOW (ref 26.6–33.0)
MCHC: 30.9 g/dL — ABNORMAL LOW (ref 31.5–35.7)
MCV: 84 fL (ref 79–97)
Monocytes Absolute: 0.5 10*3/uL (ref 0.1–0.9)
Monocytes: 5 %
Neutrophils Absolute: 7.9 10*3/uL — ABNORMAL HIGH (ref 1.4–7.0)
Neutrophils: 74 %
Platelets: 374 10*3/uL (ref 150–450)
RBC: 4.31 x10E6/uL (ref 3.77–5.28)
RDW: 13.2 % (ref 11.7–15.4)
WBC: 10.6 10*3/uL (ref 3.4–10.8)

## 2021-04-07 LAB — TSH: TSH: 1.5 u[IU]/mL (ref 0.450–4.500)

## 2021-04-07 LAB — COMPREHENSIVE METABOLIC PANEL
ALT: 18 IU/L (ref 0–32)
AST: 18 IU/L (ref 0–40)
Albumin/Globulin Ratio: 2.2 (ref 1.2–2.2)
Albumin: 4.1 g/dL (ref 3.8–4.9)
Alkaline Phosphatase: 101 IU/L (ref 44–121)
BUN/Creatinine Ratio: 19 (ref 12–28)
BUN: 16 mg/dL (ref 8–27)
Bilirubin Total: <0.2 mg/dL (ref 0.0–1.2)
CO2: 24 mmol/L (ref 20–29)
Calcium: 9.5 mg/dL (ref 8.7–10.3)
Chloride: 106 mmol/L (ref 96–106)
Creatinine, Ser: 0.84 mg/dL (ref 0.57–1.00)
Globulin, Total: 1.9 g/dL (ref 1.5–4.5)
Glucose: 97 mg/dL (ref 70–99)
Potassium: 4.1 mmol/L (ref 3.5–5.2)
Sodium: 141 mmol/L (ref 134–144)
Total Protein: 6 g/dL (ref 6.0–8.5)
eGFR: 80 mL/min/{1.73_m2} (ref >59)

## 2021-04-07 LAB — LITHIUM LEVEL: Lithium Lvl: 0.7 mmol/L (ref 0.5–1.2)

## 2021-04-07 NOTE — Progress Notes (Signed)
Please let her know labs pertaining to psych meds look good. Lithium is 0.7, kidney function tests are nl, TSH is in nl range. No change in meds.  She has slightly low results of a few CBC values, but I don't think they're significant. She should discuss those w/ PCP.

## 2021-04-08 NOTE — Progress Notes (Signed)
Patient notified. She had viewed results on MyChart as well.

## 2021-04-10 ENCOUNTER — Telehealth: Payer: Self-pay

## 2021-04-10 NOTE — Telephone Encounter (Signed)
Patient called to advise that the pharmacy is requesting PA for the [facility]. Not sure if it is needed  as I noticed an eagle NPI noted. Please double check. Thanks

## 2021-04-10 NOTE — Telephone Encounter (Signed)
Patient called back.  States Pharmacy did send to our office. Please call once we have submitted PA.

## 2021-04-18 NOTE — Telephone Encounter (Signed)
Spoke with pharmacy,they are faxing over PA

## 2021-04-22 NOTE — Telephone Encounter (Signed)
PA completed on 04/22/2021. Approved on 04/22/2021. Patient notified. Pharmacy notified and stated that medication is still $1300. Pharmacy is going to call patient regarding price.   CBIPJR:[id number];GEFUWT:[id number];Review Type:Prior Auth;Coverage Start Date:04/22/2021;Coverage End Date:04/22/2022

## 2021-04-23 NOTE — Telephone Encounter (Signed)
Patient calling and wants to see if there is a alternate that could be sent since she cannot afford the medication.

## 2021-04-26 NOTE — Telephone Encounter (Signed)
Would patient be interested in once weekly options such as Ozempic or Trulicity?

## 2021-04-29 ENCOUNTER — Other Ambulatory Visit: Payer: Self-pay | Admitting: Physician Assistant

## 2021-04-29 MED ORDER — DAPAGLIFLOZIN PROPANEDIOL 5 MG PO TABS: 5.0000 mg | ORAL_TABLET | Freq: Every day | ORAL | 2 refills | Status: AC

## 2021-04-29 NOTE — Telephone Encounter (Signed)
Patient notified

## 2021-05-15 ENCOUNTER — Telehealth: Payer: Self-pay | Admitting: Physician Assistant

## 2021-05-15 DIAGNOSIS — R202 Paresthesia of skin: Secondary | ICD-10-CM

## 2021-05-15 MED ORDER — MUPIROCIN 2 % EX OINT: TOPICAL_OINTMENT | CUTANEOUS | 3 refills | Status: DC

## 2021-05-15 NOTE — Telephone Encounter (Signed)
Patient calling to request a new Rx for mupirocin ointment. She states this is the time of year that she needs this medication and her insurance and pharmacy has changed. She would like new Rx sent to [facility] [address]. [facility] already in [place]. Last OV was 12/2020. ?

## 2021-05-23 ENCOUNTER — Encounter: Payer: Managed Care, Other (non HMO) | Admitting: Physician Assistant

## 2021-06-06 ENCOUNTER — Other Ambulatory Visit: Payer: Self-pay

## 2021-06-06 ENCOUNTER — Telehealth: Payer: Self-pay | Admitting: Physician Assistant

## 2021-06-06 MED ORDER — LITHIUM CARBONATE 300 MG PO CAPS: 900.0000 mg | ORAL_CAPSULE | Freq: Every day | ORAL | 0 refills | Status: DC

## 2021-06-06 NOTE — Telephone Encounter (Signed)
Pt  called at 10:30 am and asked for a refill of her lithium carbonate 300 mg. She has changed the pharnacy to [facility] located at [address]. Next appt 4/12 ?

## 2021-06-06 NOTE — Telephone Encounter (Signed)
Rx sent 

## 2021-06-21 ENCOUNTER — Other Ambulatory Visit: Payer: Self-pay | Admitting: Physician Assistant

## 2021-07-02 ENCOUNTER — Ambulatory Visit (INDEPENDENT_AMBULATORY_CARE_PROVIDER_SITE_OTHER): Payer: 59 | Admitting: Physician Assistant

## 2021-07-02 ENCOUNTER — Encounter: Payer: Self-pay | Admitting: Physician Assistant

## 2021-07-02 DIAGNOSIS — F319 Bipolar disorder, unspecified: Secondary | ICD-10-CM

## 2021-07-02 DIAGNOSIS — Z79899 Other long term (current) drug therapy: Secondary | ICD-10-CM | POA: Diagnosis not present

## 2021-07-02 DIAGNOSIS — E032 Hypothyroidism due to medicaments and other exogenous substances: Secondary | ICD-10-CM | POA: Diagnosis not present

## 2021-07-02 NOTE — Progress Notes (Signed)
[facility] ? ?Patient ID: Leah Powers,  ?MRN: [id number] ? ?PCP: Allwardt, Randa Evens, PA-C ? ?Date of Evaluation: 07/02/2021 ?Time spent:30 minutes ? ?Chief Complaint:  ?Chief Complaint   ?Follow-up ?  ? ? ?HISTORY/CURRENT STATUS: ?HPI for routine med check. ? ?Doing well. Recently played piano at church and enjoyed that. They're settled in their condo now. Looking forward to a vacation in May.  ? ?Patient denies loss of interest in usual activities and is able to enjoy things.  Denies decreased energy or motivation.  Appetite has not changed.  No extreme sadness, tearfulness, or feelings of hopelessness.  Denies any changes in concentration, making decisions or remembering things. ADLs and personal hygiene normal. Not having a lot of anxiety, and is sleeping well.  Denies suicidal or homicidal thoughts. ? ?Patient denies increased energy with decreased need for sleep, no increased talkativeness, no racing thoughts, no impulsivity or risky behaviors, no increased spending, no decreased libido, no grandiosity, no increased irritability or anger, no paranoia, and no hallucinations. ? ?Denies dizziness, syncope, seizures, numbness, tingling, tremor, tics, unsteady gait, slurred speech, confusion. Denies muscle or joint pain, stiffness, or dystonia. ? ?Individual Medical History/ Review of Systems: Changes? :No    ? ?Past medications for mental health diagnoses include: ?Prolixin, Haldol, Ativan, lithium, Xanax, Synthroid, gabapentin ? ?Allergies: Eucalyptus oil, Haldol [haloperidol decanoate], Molds & smuts, Other, Penicillins, Bee venom, Corn-containing products, Demeclocycline, Dolobid [diflunisal], Metformin and related, Vicodin [hydrocodone-acetaminophen], Doxycycline, Erythromycin, Keflex [cephalexin], Macrodantin, Motrin [ibuprofen], Septra [bactrim], Tetracyclines & related, and Tofranil-pm ? ?Current Medications:  ?Current Outpatient Medications:  ?  albuterol (VENTOLIN HFA) 108 (90 Base) MCG/ACT  inhaler, Inhale 2 puffs into the lungs every 6 (six) hours as needed for wheezing or shortness of breath., Disp: 1 each, Rfl: 2 ?  cetirizine (ZYRTEC) 5 MG tablet, Take 5 mg by mouth 2 (two) times daily as needed for allergies. , Disp: , Rfl:  ?  Elastic Bandages & Supports (MEDICAL COMPRESSION SOCKS) MISC, Wear compression stockings daily with upright activity. Remove at bedtime., Disp: 2 each, Rfl: 0 ?  FARXIGA 5 MG TABS tablet, Take 5 mg by mouth daily., Disp: , Rfl:  ?  gabapentin (NEURONTIN) 300 MG capsule, Take as directed ('300mg'$  5 times a day), Disp: 450 capsule, Rfl: 4 ?  Lancets (ONETOUCH DELICA PLUS [id number]) MISC, Inject 1 Stick into the skin daily., Disp: 100 each, Rfl: 2 ?  levothyroxine (SYNTHROID) 125 MCG tablet, TAKE ONE TABLET BY MOUTH DAILY BEFORE BREAKFAST, Disp: 90 tablet, Rfl: 1 ?  lithium carbonate 300 MG capsule, Take 3 capsules (900 mg total) by mouth at bedtime., Disp: 270 capsule, Rfl: 0 ?  losartan (COZAAR) 50 MG tablet, Take 1 tablet by mouth once daily, Disp: 30 tablet, Rfl: 0 ?  mupirocin ointment (BACTROBAN) 2 %, Apply topical to aa qd, Disp: 22 g, Rfl: 3 ?  naproxen sodium (ALEVE) 220 MG tablet, Take 220 mg by mouth daily as needed (pain)., Disp: , Rfl:  ?  ONETOUCH VERIO test strip, 1 each by Other route daily., Disp: 100 each, Rfl: 3 ?  carboxymethylcellulose (REFRESH PLUS) 0.5 % SOLN, Place 1 drop into both eyes 3 (three) times daily as needed (dry eyes)., Disp: , Rfl:  ?  furosemide (LASIX) 20 MG tablet, Take 1 tablet (20 mg total) by mouth daily., Disp: 90 tablet, Rfl: 1 ?  rosuvastatin (CRESTOR) 20 MG tablet, Take 1 tablet (20 mg total) by mouth daily., Disp: 90 tablet, Rfl: 1 ?  Semaglutide (  RYBELSUS) 7 MG TABS, Take 7 mg by mouth daily. (Patient not taking: Reported on 07/02/2021), Disp: 90 tablet, Rfl: 1 ?Medication Side Effects: none ? ?Family Medical/ Social History: Changes? no ? ?MENTAL HEALTH EXAM: ? ?Last menstrual period 07/30/2013.There is no height or weight on file  to calculate BMI.  ?General Appearance: Casual, Well Groomed, and Obese  ?Eye Contact:  Good  ?Speech:  Clear and Coherent, Normal Rate, Talkative, and giggly, which is her normal.  ?Volume:  Normal  ?Mood:  Euthymic  ?Affect:  Congruent  ?Thought Process:  Goal Directed and Descriptions of Associations: Circumstantial  ?Orientation:  Full (Time, Place, and Person)  ?Thought Content: Logical   ?Suicidal Thoughts:  No  ?Homicidal Thoughts:  No  ?Memory:  WNL  ?Judgement:  Good  ?Insight:  Good  ?Psychomotor Activity:  Normal  ?Concentration:  Concentration: Good and Attention Span: Good  ?Recall:  Good  ?Fund of Knowledge: Good  ?Language: Good  ?Assets:  Desire for Improvement  ?ADL's:  Intact  ?Cognition: WNL  ?Prognosis:  Good  ? ?04/05/2021  ?lithium level 0.7 ?TSH 1.5 ?CMP all completely normal ?CBC MCH and MCHC slightly low. ? ?DIAGNOSES:  ?  ICD-10-CM   ?1. Bipolar I disorder ([place])  I77.8 Basic metabolic panel  ?  TSH  ?  Lithium level  ?  CANCELED: Lithium level  ?  CANCELED: Basic metabolic panel  ?  CANCELED: TSH  ?  ?2. Hypothyroidism due to medication  E03.2   ?  ?3. Encounter for long-term (current) use of medications  E42.353 Basic metabolic panel  ?  TSH  ?  Lithium level  ?  CANCELED: Lithium level  ?  CANCELED: Basic metabolic panel  ?  CANCELED: TSH  ?  ? ? ?Receiving Psychotherapy: No  ? ? ?RECOMMENDATIONS:  ?PDMP was reviewed.  No results available. ?I provided 30 minutes of face to face time during this encounter, including time spent before and after the visit in records review, medical decision making, counseling pertinent to today's visit, and charting.  ?She is doing well so no changes will be made. ? ?Continue Synthroid 125 micrograms daily. ?Continue lithium 900 mg/day. ?Continue gabapentin 300 mg,  2 po q am, 1 mid-day, 2 po qhs. ?Labs ordered as above.  (Canceled initially because her insurance requires it to be on separate orders, was reordered to reflect that.) ?Return in 3  months. ? ?Donnal Moat, PA-C  ?

## 2021-07-08 ENCOUNTER — Other Ambulatory Visit: Payer: Self-pay | Admitting: Physician Assistant

## 2021-07-09 LAB — BASIC METABOLIC PANEL
BUN/Creatinine Ratio: 22 (ref 12–28)
BUN: 17 mg/dL (ref 8–27)
CO2: 21 mmol/L (ref 20–29)
Calcium: 10 mg/dL (ref 8.7–10.3)
Chloride: 105 mmol/L (ref 96–106)
Creatinine, Ser: 0.79 mg/dL (ref 0.57–1.00)
Glucose: 187 mg/dL — ABNORMAL HIGH (ref 70–99)
Potassium: 4.3 mmol/L (ref 3.5–5.2)
Sodium: 140 mmol/L (ref 134–144)
eGFR: 86 mL/min/{1.73_m2} (ref >59)

## 2021-07-09 LAB — LITHIUM LEVEL: Lithium Lvl: 0.5 mmol/L (ref 0.5–1.2)

## 2021-07-09 LAB — TSH: TSH: 1.86 u[IU]/mL (ref 0.450–4.500)

## 2021-07-09 NOTE — Progress Notes (Signed)
Noted  

## 2021-07-09 NOTE — Progress Notes (Signed)
Please let her know the lithium level is a little low but a good range for her so no change in dose. ?TSH and kidney function are normal. ?Her blood sugar is 187, very high so she should see her PCP soon concerning that. ?Thank you.

## 2021-07-17 ENCOUNTER — Other Ambulatory Visit: Payer: Self-pay | Admitting: Family Medicine

## 2021-07-17 DIAGNOSIS — Z1231 Encounter for screening mammogram for malignant neoplasm of breast: Secondary | ICD-10-CM

## 2021-07-27 ENCOUNTER — Other Ambulatory Visit: Payer: Self-pay | Admitting: Physician Assistant

## 2021-08-01 ENCOUNTER — Encounter: Payer: Self-pay | Admitting: Family Medicine

## 2021-08-01 ENCOUNTER — Ambulatory Visit (INDEPENDENT_AMBULATORY_CARE_PROVIDER_SITE_OTHER): Payer: Commercial Managed Care - HMO | Admitting: Family Medicine

## 2021-08-01 VITALS — BP 120/74 | HR 60 | Temp 97.5°F | Ht 67.5 in | Wt 247.0 lb

## 2021-08-01 DIAGNOSIS — E038 Other specified hypothyroidism: Secondary | ICD-10-CM

## 2021-08-01 DIAGNOSIS — I10 Essential (primary) hypertension: Secondary | ICD-10-CM | POA: Diagnosis not present

## 2021-08-01 DIAGNOSIS — F319 Bipolar disorder, unspecified: Secondary | ICD-10-CM

## 2021-08-01 DIAGNOSIS — E119 Type 2 diabetes mellitus without complications: Secondary | ICD-10-CM

## 2021-08-01 LAB — CBC WITH DIFFERENTIAL/PLATELET
Basophils Absolute: 0.1 10*3/uL (ref 0.0–0.1)
Basophils Relative: 1.2 % (ref 0.0–3.0)
Eosinophils Absolute: 0.6 10*3/uL (ref 0.0–0.7)
Eosinophils Relative: 6.6 % — ABNORMAL HIGH (ref 0.0–5.0)
HCT: 36.2 % (ref 36.0–46.0)
Hemoglobin: 11 g/dL — ABNORMAL LOW (ref 12.0–15.0)
Lymphocytes Relative: 18.8 % (ref 12.0–46.0)
Lymphs Abs: 1.7 10*3/uL (ref 0.7–4.0)
MCHC: 30.4 g/dL (ref 30.0–36.0)
MCV: 76.8 fl — ABNORMAL LOW (ref 78.0–100.0)
Monocytes Absolute: 0.4 10*3/uL (ref 0.1–1.0)
Monocytes Relative: 4.7 % (ref 3.0–12.0)
Neutro Abs: 6.2 10*3/uL (ref 1.4–7.7)
Neutrophils Relative %: 68.7 % (ref 43.0–77.0)
Platelets: 368 10*3/uL (ref 150.0–400.0)
RBC: 4.72 Mil/uL (ref 3.87–5.11)
RDW: 16.3 % — ABNORMAL HIGH (ref 11.5–15.5)
WBC: 9 10*3/uL (ref 4.0–10.5)

## 2021-08-01 LAB — COMPREHENSIVE METABOLIC PANEL
ALT: 15 U/L (ref 0–35)
AST: 16 U/L (ref 0–37)
Albumin: 4.1 g/dL (ref 3.5–5.2)
Alkaline Phosphatase: 89 U/L (ref 39–117)
BUN: 22 mg/dL (ref 6–23)
CO2: 28 mEq/L (ref 19–32)
Calcium: 9.5 mg/dL (ref 8.4–10.5)
Chloride: 108 mEq/L (ref 96–112)
Creatinine, Ser: 0.82 mg/dL (ref 0.40–1.20)
GFR: 77.67 mL/min (ref >60.00)
Glucose, Bld: 130 mg/dL — ABNORMAL HIGH (ref 70–99)
Potassium: 4.5 mEq/L (ref 3.5–5.1)
Sodium: 140 mEq/L (ref 135–145)
Total Bilirubin: 0.4 mg/dL (ref 0.2–1.2)
Total Protein: 6.4 g/dL (ref 6.0–8.3)

## 2021-08-01 LAB — LIPID PANEL
Cholesterol: 110 mg/dL (ref 0–200)
HDL: 48.9 mg/dL (ref >39.00)
LDL Cholesterol: 48 mg/dL (ref 0–99)
NonHDL: 60.97
Total CHOL/HDL Ratio: 2
Triglycerides: 67 mg/dL (ref 0.0–149.0)
VLDL: 13.4 mg/dL (ref 0.0–40.0)

## 2021-08-01 LAB — HEMOGLOBIN A1C: Hgb A1c MFr Bld: 6.9 % — ABNORMAL HIGH (ref 4.6–6.5)

## 2021-08-01 LAB — URIC ACID: Uric Acid, Serum: 4 mg/dL (ref 2.4–7.0)

## 2021-08-01 LAB — MICROALBUMIN / CREATININE URINE RATIO
Creatinine,U: 41.5 mg/dL
Microalb Creat Ratio: 1.7 mg/g (ref 0.0–30.0)
Microalb, Ur: <0.7 mg/dL (ref 0.0–1.9)

## 2021-08-01 MED ORDER — LOSARTAN POTASSIUM 50 MG PO TABS: 50.0000 mg | ORAL_TABLET | Freq: Every day | ORAL | 3 refills | Status: DC

## 2021-08-01 MED ORDER — LEVOTHYROXINE SODIUM 125 MCG PO TABS: ORAL_TABLET | ORAL | 1 refills | Status: DC

## 2021-08-01 MED ORDER — FUROSEMIDE 20 MG PO TABS
20.0000 mg | ORAL_TABLET | Freq: Every day | ORAL | 3 refills | Status: DC
Start: 2021-08-01 — End: 2022-07-30

## 2021-08-01 NOTE — Patient Instructions (Signed)
It was very nice to see you today!  Check insurance about ozempic, trulicity, monjaro   PLEASE NOTE:  If you had any lab tests please let us know if you have not heard back within a few days. You may see your results on MyChart before we have a chance to review them but we will give you a call once they are reviewed by [place]. If we ordered any referrals today, please let us know if you have not heard from their office within the next week.   Please try these tips to maintain a healthy lifestyle:  Eat most of your calories during the day when you are active. Eliminate processed foods including packaged sweets (pies, cakes, cookies), reduce intake of potatoes, white bread, white pasta, and white rice. Look for whole grain options, oat flour or almond flour.  Each meal should contain half fruits/vegetables, one quarter protein, and one quarter carbs (no bigger than a computer mouse).  Cut down on sweet beverages. This includes juice, soda, and sweet tea. Also watch fruit intake, though this is a healthier sweet option, it still contains natural sugar! Limit to 3 servings daily.  Drink at least 1 glass of water with each meal and aim for at least 8 glasses per day  Exercise at least 150 minutes every week.

## 2021-08-01 NOTE — Progress Notes (Signed)
Subjective:     Patient ID: Leah Powers, female    DOB: Mar 29, 1960, 61 y.o.   MRN: [id number]  Chief Complaint  Patient presents with   Transfer of Care    Had to change providers due to insurance Fasting     HPI TOC-insurance no cover PA  DM type 2-ins not cover rybelsus so only on Farxiga now.  No A1C for awhile.  Working on [employer]. Sugars running 150 now.  Wasn't exercising as much. Meds changed in January. Diarrhea from metformin. Ophth sch in June.  Foot exam 1 yr ago. Sees pod for toenails/calluses HTN-on furosemide for edema. Ankle surg in past on R.  Losartan '50mg'$ .  115-120/58-70.  No dizziness/ha/cp/palp/cough/sob. Hypothyroidism-doing well on meds Bipolar 1-stable on meds.  No SI Neurodermatitis-gabapentin from derm-helps some.   Health Maintenance Due  Topic Date Due   OPHTHALMOLOGY EXAM  Never done   COLONOSCOPY (Pts 45-99yr Insurance coverage will need to be confirmed)  Never done   HEMOGLOBIN A1C  11/26/2014   PAP SMEAR-Modifier  01/20/2020   Zoster Vaccines- Shingrix (2 of 2) 07/10/2021    Past Medical History:  Diagnosis Date   Allergy    Arthritis    L hand, R ankle  (10/08/2016)   Bipolar 1 disorder (HCC)    Depression    Diabetes mellitus without complication ([place]    Fibromyalgia    told that the breakout on the upper back , could be over active nerves , also treated with cream & Gabapentin- [facility] [place]    History of kidney stones    spontaneous passing   Hypertension    Hypothyroid    Pneumonia ~ 1967; 07/2016   Pyelonephritis 1997   treated with IV antibiotic     Past Surgical History:  Procedure Laterality Date   BLADDER INSTILLATION  X 2   CHALAZION EXCISION Bilateral    R- eye X2 , L eye- X1   FOOT ARTHRODESIS Right 10/08/2016   Procedure: RIGHT TRIPLE ARTHRODESIS;  Surgeon: DNewt Minion MD;  [facility]  Service: Orthopedics;  Laterality: Right;   FOOT ARTHRODESIS, TRIPLE Right 10/08/2016   ankle    FRENULECTOMY, LINGUAL  1970s   LAPAROSCOPIC CHOLECYSTECTOMY  1996   PALATE SURGERY  1970s   tissue removed from upper palate as a child    URETHRAL DILATION      Outpatient Medications Prior to Visit  Medication Sig Dispense Refill   albuterol (VENTOLIN HFA) 108 (90 Base) MCG/ACT inhaler Inhale 2 puffs into the lungs every 6 (six) hours as needed for wheezing or shortness of breath. 1 each 2   cetirizine (ZYRTEC) 5 MG tablet Take 5 mg by mouth 2 (two) times daily as needed for allergies.      Elastic Bandages & Supports (MEDICAL COMPRESSION SOCKS) MISC Wear compression stockings daily with upright activity. Remove at bedtime. 2 each 0   FARXIGA 5 MG TABS tablet Take 1 tablet by mouth once daily 30 tablet 0   gabapentin (NEURONTIN) 300 MG capsule Take as directed ('300mg'$  5 times a day) 450 capsule 4   Lancets (ONETOUCH DELICA PLUS [id number] MISC Inject 1 Stick into the skin daily. 100 each 2   lithium carbonate 300 MG capsule Take 3 capsules (900 mg total) by mouth at bedtime. 270 capsule 0   mupirocin ointment (BACTROBAN) 2 % Apply topical to aa qd 22 g 3   naproxen sodium (ALEVE) 220 MG tablet Take 220 mg by  mouth daily as needed (pain).     ONETOUCH VERIO test strip 1 each by Other route daily. 100 each 3   rosuvastatin (CRESTOR) 20 MG tablet Take 1 tablet (20 mg total) by mouth daily. 90 tablet 1   furosemide (LASIX) 20 MG tablet Take 1 tablet (20 mg total) by mouth daily. 90 tablet 1   levothyroxine (SYNTHROID) 125 MCG tablet TAKE ONE TABLET BY MOUTH DAILY BEFORE BREAKFAST 90 tablet 1   losartan (COZAAR) 50 MG tablet Take 1 tablet by mouth once daily 30 tablet 0   carboxymethylcellulose (REFRESH PLUS) 0.5 % SOLN Place 1 drop into both eyes 3 (three) times daily as needed (dry eyes).     Semaglutide (RYBELSUS) 7 MG TABS Take 7 mg by mouth daily. (Patient not taking: Reported on 07/02/2021) 90 tablet 1   No facility-administered medications prior to visit.    Allergies  Allergen  Reactions   Eucalyptus Oil Shortness Of Breath    Other reaction(s): flush, cant breath   Haldol [Haloperidol Decanoate] Other (See Comments)    Drooling, weaving in floor, parkinson's syndrome (couldn't eat or talk) , pt was hospitalized    Molds & Smuts Shortness Of Breath and Itching   Other Anaphylaxis, Shortness Of Breath, Itching and Swelling    Bleu cheese    Penicillins Anaphylaxis and Rash    Pt was 61 years old  Has patient had a PCN reaction causing immediate rash, facial/tongue/throat swelling, SOB or lightheadedness with hypotension: Yes Has patient had a PCN reaction causing severe rash involving mucus membranes or skin necrosis: Unknown Has patient had a PCN reaction that required hospitalization: No Has patient had a PCN reaction occurring within the last 10 years: No If all of the above answers are "NO", then may proceed with Cephalosporin use.    Bee Venom Other (See Comments)    Yellow jackets and wasps-stung by 56, MD told pt that she wouldn't have resistance if stung again    Corn Oil     Other reaction(s): diarrhea, lots   Corn-Containing Products Nausea And Vomiting and Other (See Comments)    Stomach distress    Demeclocycline Other (See Comments)   Diflunisal Other (See Comments)    Upset stomach Other reaction(s): Unknown   Haloperidol Lactate     Other reaction(s): stumble around, droul   Hydrochlorothiazide     Other reaction(s): elevated lithium level   Hydrocodone-Acetaminophen     Other reaction(s): abd pain   Imipramine     Other reaction(s): rash   Metformin     Other reaction(s): stomach upset   Metformin And Related    Nitrofurantoin     Other reaction(s): rash   Penicillin G     Other reaction(s): rash   Sulfamethoxazole-Trimethoprim     Other reaction(s): rash   Tetracycline Hcl     Other reaction(s): rash   Vicodin [Hydrocodone-Acetaminophen] Diarrhea and Other (See Comments)    Upset stomach   Cephalexin Rash    Other reaction(s):  diarrhea   Doxycycline Rash    Other reaction(s): rash   Erythromycin Rash    Other reaction(s): rash   Macrodantin Rash   Motrin [Ibuprofen] Palpitations   Septra [Bactrim] Rash   Tetracyclines & Related Rash   Tofranil-Pm Rash   ROS neg/noncontributory except as noted HPI/below Occ pain from L lower rib to front-ice pack helps.  About 3 x/mo.  Very sharp pain when occurs and lasts up to 76mn. Can be at rest and brought  on by movement and worse to touch it when occurs.  FH gout and pt gets a lot of knee pain R      Objective:     BP 120/74   Pulse 60   Temp (!) 97.5 F (36.4 C) (Temporal)   Ht 5' 7.5" (1.715 m)   Wt 247 lb (112 kg)   LMP 07/30/2013   SpO2 96%   BMI 38.11 kg/m  Wt Readings from Last 3 Encounters:  08/01/21 247 lb (112 kg)  04/01/21 235 lb 4 oz (106.7 kg)  10/26/19 254 lb 8 oz (115.4 kg)    Physical Exam   Gen: WDWN NAD [facility] HEENT: NCAT, conjunctiva not injected, sclera nonicteric NECK:  supple, no thyromegaly, no nodes, no carotid bruits CARDIAC: RRR, S1S2+, no murmur. DP 2+B LUNGS: CTAB. No wheezes ABDOMEN:  BS+, soft, NTND, No HSM, no masses EXT:  1+ edema RLE>LLE MSK: no gross abnormalities.  NEURO: A&O x3.  CN II-XII intact.  PSYCH: normal mood. Good eye contact    Foot exam-calluses/toes Assessment & Plan:   Problem List Items Addressed This Visit       Cardiovascular and Mediastinum   HTN (hypertension) (Chronic)   Relevant Medications   losartan (COZAAR) 50 MG tablet   furosemide (LASIX) 20 MG tablet   Other Relevant Orders   Comprehensive metabolic panel   Hemoglobin A1c   Lipid panel   CBC with Differential/Platelet   Uric acid   Microalbumin / creatinine urine ratio     Endocrine   Hypothyroidism (Chronic)   Relevant Medications   levothyroxine (SYNTHROID) 125 MCG tablet   Type 2 diabetes mellitus without complication, without long-term current use of insulin (HCC) - Primary   Relevant Medications   losartan (COZAAR)  50 MG tablet   Other Relevant Orders   Comprehensive metabolic panel   Hemoglobin A1c   Lipid panel   CBC with Differential/Platelet   Uric acid   Microalbumin / creatinine urine ratio     Other   Bipolar I disorder (HCC) (Chronic)   Type 2 DM-chronic. Controlled.  Had some med changes in Jan d/t insurance.  Sugars a little higher.  Intol metformin.  Cont Farxiga '5mg'$ .  Will check A1C,microalb/creat,cmp,lipids.  Pt will check ins for GLP-1 and poss use copay cards for affordability.  Wt has dec 20# in 1 yr, but still room for improvement.  Work on [employer].  Has opt appt in June.  Sees pod.  Foot exam done today.  Has seen nutritionist in past HTN-chronic/controlled.  Check uric acid and cmp/cbc.  Cont losartan '50mg'$  and lasix '20mg'$ .  Work on [employer] Hypothyroidism-chronic/controlled.  Cont synthroid 125 Bipolar 1-chronic/controlled.  Managed by [facility].  Cont meds Pt will get shingrix #2 at pharm.  Sch pap in 6 mo  Meds ordered this encounter  Medications   losartan (COZAAR) 50 MG tablet    Sig: Take 1 tablet (50 mg total) by mouth daily.    Dispense:  90 tablet    Refill:  3   furosemide (LASIX) 20 MG tablet    Sig: Take 1 tablet (20 mg total) by mouth daily.    Dispense:  90 tablet    Refill:  3   levothyroxine (SYNTHROID) 125 MCG tablet    Sig: TAKE ONE TABLET BY MOUTH DAILY BEFORE BREAKFAST    Dispense:  90 tablet    Refill:  1    Wellington Hampshire, MD

## 2021-08-05 ENCOUNTER — Ambulatory Visit (INDEPENDENT_AMBULATORY_CARE_PROVIDER_SITE_OTHER): Payer: Commercial Managed Care - HMO | Admitting: Family Medicine

## 2021-08-05 ENCOUNTER — Encounter: Payer: Self-pay | Admitting: Family Medicine

## 2021-08-05 VITALS — BP 130/70 | HR 68 | Temp 97.9°F | Ht 67.5 in | Wt 224.5 lb

## 2021-08-05 DIAGNOSIS — M5431 Sciatica, right side: Secondary | ICD-10-CM

## 2021-08-05 DIAGNOSIS — D508 Other iron deficiency anemias: Secondary | ICD-10-CM

## 2021-08-05 DIAGNOSIS — M5432 Sciatica, left side: Secondary | ICD-10-CM | POA: Diagnosis not present

## 2021-08-05 MED ORDER — PREDNISONE 20 MG PO TABS: 40.0000 mg | ORAL_TABLET | Freq: Every day | ORAL | 0 refills | Status: AC

## 2021-08-05 NOTE — Patient Instructions (Addendum)
It was very nice to see you today!  Referring to GI  Tylenol-'1000mg'$  3x/day as needed When done with prednisone,  can do naproxen 1-2 tabs twice daily.  Also can do tumeric capsules Sending to PT   PLEASE NOTE:  If you had any lab tests please let us know if you have not heard back within a few days. You may see your results on MyChart before we have a chance to review them but we will give you a call once they are reviewed by [place]. If we ordered any referrals today, please let us know if you have not heard from their office within the next week.   Please try these tips to maintain a healthy lifestyle:  Eat most of your calories during the day when you are active. Eliminate processed foods including packaged sweets (pies, cakes, cookies), reduce intake of potatoes, white bread, white pasta, and white rice. Look for whole grain options, oat flour or almond flour.  Each meal should contain half fruits/vegetables, one quarter protein, and one quarter carbs (no bigger than a computer mouse).  Cut down on sweet beverages. This includes juice, soda, and sweet tea. Also watch fruit intake, though this is a healthier sweet option, it still contains natural sugar! Limit to 3 servings daily.  Drink at least 1 glass of water with each meal and aim for at least 8 glasses per day  Exercise at least 150 minutes every week.

## 2021-08-05 NOTE — Progress Notes (Signed)
Subjective:     Patient ID: Leah Powers, female    DOB: [birth year], 61 y.o.   MRN: [id number]  Chief Complaint  Patient presents with   Pain    Dull pain in left buttock, took two aleve and heat    HPI-here w/husb Has chronic pain -lower back-very bad into buttocks when gets up.  Pain L leg freq.  Today, more in R.   Since April, more severe.  Using ice, cane.  Getting more sharp.  Some n/t R foot-ankle surgery in past.  Not falling. Not weak. Just can be really bad. No b/b dysfunction. No saddle anesthesia.   Had some blood in stools in April-brbpr.  Cscope 3 yrs ago-poor pred so reapeat.  Some polyps. Dr. Jacalyn Lefevre change so can't go back.  Health Maintenance Due  Topic Date Due   OPHTHALMOLOGY EXAM  Never done   COLONOSCOPY (Pts 45-58yr Insurance coverage will need to be confirmed)  Never done   PAP SMEAR-Modifier  01/20/2020   Zoster Vaccines- Shingrix (2 of 2) 07/10/2021    Past Medical History:  Diagnosis Date   Allergy    Arthritis    L hand, R ankle  (10/08/2016)   Bipolar 1 disorder ([facility]    Depression    Diabetes mellitus without complication ([place]    Fibromyalgia    told that the breakout on the upper back , could be over active nerves , also treated with cream & Gabapentin[facility] [place]    History of kidney stones    spontaneous passing   Hypertension    Hypothyroid    Pneumonia ~ 1967; 07/2016   Pyelonephritis 1997   treated with IV antibiotic     Past Surgical History:  Procedure Laterality Date   BLADDER INSTILLATION  X 2   CHALAZION EXCISION Bilateral    R- eye X2 , L eye- X1   FOOT ARTHRODESIS Right 10/08/2016   Procedure: RIGHT TRIPLE ARTHRODESIS;  Surgeon: DNewt Minion MD;  [place] [facility]  Service: Orthopedics;  Laterality: Right;   FOOT ARTHRODESIS, TRIPLE Right 10/08/2016   ankle   FRENULECTOMY, LINGUAL  1970s   LAPAROSCOPIC CHOLECYSTECTOMY  1996   PALATE SURGERY  1970s   tissue removed from upper palate as  a child    URETHRAL DILATION      Outpatient Medications Prior to Visit  Medication Sig Dispense Refill   albuterol (VENTOLIN HFA) 108 (90 Base) MCG/ACT inhaler Inhale 2 puffs into the lungs every 6 (six) hours as needed for wheezing or shortness of breath. 1 each 2   cetirizine (ZYRTEC) 5 MG tablet Take 5 mg by mouth 2 (two) times daily as needed for allergies.      Elastic Bandages & Supports (MEDICAL COMPRESSION SOCKS) MISC Wear compression stockings daily with upright activity. Remove at bedtime. 2 each 0   FARXIGA 5 MG TABS tablet Take 1 tablet by mouth once daily 30 tablet 0   furosemide (LASIX) 20 MG tablet Take 1 tablet (20 mg total) by mouth daily. 90 tablet 3   gabapentin (NEURONTIN) 300 MG capsule Take as directed ('300mg'$  5 times a day) 450 capsule 4   Lancets (ONETOUCH DELICA PLUS [id number] MISC Inject 1 Stick into the skin daily. 100 each 2   levothyroxine (SYNTHROID) 125 MCG tablet TAKE ONE TABLET BY MOUTH DAILY BEFORE BREAKFAST 90 tablet 1   lithium carbonate 300 MG capsule Take 3 capsules (900 mg total) by mouth at bedtime. 270 capsule  0   losartan (COZAAR) 50 MG tablet Take 1 tablet (50 mg total) by mouth daily. 90 tablet 3   mupirocin ointment (BACTROBAN) 2 % Apply topical to aa qd 22 g 3   naproxen sodium (ALEVE) 220 MG tablet Take 220 mg by mouth daily as needed (pain).     ONETOUCH VERIO test strip 1 each by Other route daily. 100 each 3   rosuvastatin (CRESTOR) 20 MG tablet Take 1 tablet (20 mg total) by mouth daily. 90 tablet 1   No facility-administered medications prior to visit.    Allergies  Allergen Reactions   Eucalyptus Oil Shortness Of Breath    Other reaction(s): flush, cant breath   Haldol [Haloperidol Decanoate] Other (See Comments)    Drooling, weaving in floor, parkinson's syndrome (couldn't eat or talk) , pt was hospitalized    Molds & Smuts Shortness Of Breath and Itching   Other Anaphylaxis, Shortness Of Breath, Itching and Swelling    Bleu  cheese    Penicillins Anaphylaxis and Rash    Pt was 61 years old  Has patient had a PCN reaction causing immediate rash, facial/tongue/throat swelling, SOB or lightheadedness with hypotension: Yes Has patient had a PCN reaction causing severe rash involving mucus membranes or skin necrosis: Unknown Has patient had a PCN reaction that required hospitalization: No Has patient had a PCN reaction occurring within the last 10 years: No If all of the above answers are "NO", then may proceed with Cephalosporin use.    Bee Venom Other (See Comments)    Yellow jackets and wasps-stung by 24, MD told pt that she wouldn't have resistance if stung again    Corn Oil     Other reaction(s): diarrhea, lots   Corn-Containing Products Nausea And Vomiting and Other (See Comments)    Stomach distress    Demeclocycline Other (See Comments)   Diflunisal Other (See Comments)    Upset stomach Other reaction(s): Unknown   Haloperidol Lactate     Other reaction(s): stumble around, droul   Hydrochlorothiazide     Other reaction(s): elevated lithium level   Hydrocodone-Acetaminophen     Other reaction(s): abd pain   Imipramine     Other reaction(s): rash   Metformin     Other reaction(s): stomach upset   Metformin And Related    Nitrofurantoin     Other reaction(s): rash   Penicillin G     Other reaction(s): rash   Sulfamethoxazole-Trimethoprim     Other reaction(s): rash   Tetracycline Hcl     Other reaction(s): rash   Vicodin [Hydrocodone-Acetaminophen] Diarrhea and Other (See Comments)    Upset stomach   Cephalexin Rash    Other reaction(s): diarrhea   Doxycycline Rash    Other reaction(s): rash   Erythromycin Rash    Other reaction(s): rash   Macrodantin Rash   Motrin [Ibuprofen] Palpitations   Septra [Bactrim] Rash   Tetracyclines & Related Rash   Tofranil-Pm Rash   ROS neg/noncontributory except as noted HPI/below      Objective:     BP 130/70   Pulse 68   Temp 97.9 F (36.6 C)  (Temporal)   Ht 5' 7.5" (1.715 m)   Wt 224 lb 8 oz (101.8 kg)   LMP 07/30/2013   SpO2 97%   BMI 34.64 kg/m  Wt Readings from Last 3 Encounters:  08/05/21 224 lb 8 oz (101.8 kg)  08/01/21 247 lb (112 kg)  04/01/21 235 lb 4 oz (106.7 kg)  Physical Exam   Gen: WDWN NAD [facility] HEENT: NCAT, conjunctiva not injected, sclera nonicteric NECK:  supple, no thyromegaly, no nodes, no carotid bruits ABDOMEN:  BS+, soft, NTND, No HSM, no masses EXT:  no edema MSK: Back:  can stand on heels/toes/1 leg.  No  TTP. Mild B SI tenderness. No rash.  MS 5/5 BLE.  DTR 2+ BLE.  SLR sl + L.  Good ROM  - but some pain.  NEURO: A&O x3.  CN II-XII intact.  PSYCH: normal mood. Good eye contact  Ct from 07/07/20: Musculoskeletal: Grade 1 anterolisthesis of L4 on L5 with associated degenerative disc disease and facet hypertrophy. Additional degenerative disc disease at L5-S1. No acute osseous abnormalities are seen.  Disc labs done last wk     Assessment & Plan:   Problem List Items Addressed This Visit   None Visit Diagnoses     Bilateral sciatica    -  Primary   Other iron deficiency anemia       Relevant Orders   CBC with Differential/Platelet   Vitamin B12   IBC + Ferritin   Ambulatory referral to Gastroenterology      B sciatica-acute on chronic.  Pred '40mg'$  x5days.  Tylenol.   When done w/pred-can do aleve 2 bid.  Stretches.  To PT.  Does have some DDD. IDA-due cscope.  Refer GI.  Reck 1-2 mo iron, cbc, B12  No orders of the defined types were placed in this encounter.   Wellington Hampshire, MD

## 2021-08-13 NOTE — Therapy (Unsigned)
OUTPATIENT PHYSICAL THERAPY THORACOLUMBAR EVALUATION   Patient Name: Leah Powers MRN: [id number] DOB:[birth year], 61 y.o., female Today's Date: 08/14/2021   PT End of Session - 08/14/21 1303     Visit Number 1    Number of Visits 12    Date for PT Re-Evaluation 09/25/21    Authorization Type cigna    PT Start Time 1304    PT Stop Time 1337    PT Time Calculation (min) 33 min             Past Medical History:  Diagnosis Date   Allergy    Arthritis    L hand, R ankle  (10/08/2016)   Bipolar 1 disorder ([place])    Depression    Diabetes mellitus without complication ([place])    Fibromyalgia    told that the breakout on the upper back , could be over active nerves , also treated with cream & Gabapentin- [facility] [place]    History of kidney stones    spontaneous passing   Hypertension    Hypothyroid    Pneumonia ~ 1967; 07/2016   Pyelonephritis 1997   treated with IV antibiotic    Past Surgical History:  Procedure Laterality Date   BLADDER INSTILLATION  X 2   CHALAZION EXCISION Bilateral    R- eye X2 , L eye- X1   FOOT ARTHRODESIS Right 10/08/2016   Procedure: RIGHT TRIPLE ARTHRODESIS;  Surgeon: Newt Minion, MD;  Location: [facility] [facility];  Service: Orthopedics;  Laterality: Right;   FOOT ARTHRODESIS, TRIPLE Right 10/08/2016   ankle   FRENULECTOMY, LINGUAL  1970s   LAPAROSCOPIC CHOLECYSTECTOMY  1996   PALATE SURGERY  1970s   tissue removed from upper palate as a child    URETHRAL DILATION     Patient Active Problem List   Diagnosis Date Noted   Nausea vomiting and diarrhea 05/20/2018   AKI (acute kidney injury) ([place]) 05/20/2018   Lithium toxicity 05/20/2018   Type 2 diabetes mellitus without complication, without long-term current use of insulin ([place]) 05/20/2018   Bipolar I disorder ([place]) 02/04/2018   S/P ankle fusion 11/12/2016   Posterior tibial tendon dysfunction (PTTD) of right lower extremity 10/08/2016   HTN (hypertension) 05/31/2014    Hypothyroidism 05/31/2014    PCP: Tawnya Crook, MD   REFERRING PROVIDER: Tawnya Crook, MD   REFERRING DIAG: [phone] (ICD-10-CM) - Bilateral sciatica  Rationale for Evaluation and Treatment Rehabilitation  THERAPY DIAG:  Bilateral sciatica  Muscle weakness (generalized)  Difficulty in walking, not elsewhere classified  ONSET DATE: chronic, right leg over the last month  SUBJECTIVE:  SUBJECTIVE STATEMENT: States she had a fall back in 47' and she has always had issues in the left hip. States it has intensified more since more as of late. States that the weather makes her feels terrible. She has been on prednisone and it helped a little bit. Reports that the pain has increased and is now going down her right leg and has gotten worse over the last month. States she has a 4 prong cane that she uses when she is in a lot of pain. She also has pain in her feet as she has corns and is waiting to have surgery to help with that. States that she has used her massager with minimal relief. Wants to learn what she can do for the pain.  PERTINENT HISTORY:  hx r ankle surgery, fibromyalgia, DB, bipolar, depression  PAIN:  Are you having pain? Yes: NPRS scale: 6/10 Pain location: left leg Pain description: dull achy Aggravating factors: sit to stand, standing, laying left side Relieving factors: rest   PRECAUTIONS: None  WEIGHT BEARING RESTRICTIONS No  FALLS:  Has patient fallen in last 6 months? No  LIVING ENVIRONMENT: Lives with: lives with their family Has following equipment at home: quad cane  OCCUPATION: not currently working  PLOF: Independent   PATIENT GOALS learn ways to reduce pressure and pain in back. Improve static postures/posiitoning   OBJECTIVE:   DIAGNOSTIC  FINDINGS:  No recent images   SCREENING FOR RED FLAGS: Bowel or bladder incontinence: No Spinal tumors: No Cauda equina syndrome: No Compression fracture: No Abdominal aneurysm: No  COGNITION:  Overall cognitive status: Within functional limits for tasks assessed       MUSCLE LENGTH: Hamstrings: Right 0 deg; Left 0 deg   POSTURE/Swelling:  hyper extends bilateral knees, increased B swelling - wearing compression stocking, forward head  PALPATION:  tenderness in legs due to swelling R>L, increased tension in lumbar paraspinals - felt good with palpation  LUMBAR ROM:   Active  A/PROM  eval  Flexion 50% limited  Extension 100% limited  Right lateral flexion 25% limited  Left lateral flexion 75% limited and painful  Right rotation   Left rotation    (Blank rows = not tested)     LE Measurements Lower Extremity Right 08/14/2021 Left 08/14/2021   A/PROM MMT A/PROM MMT  Hip Flexion WFL 4 WFL 4  Hip Extension      Hip Abduction  4-  3+*  Hip Adduction      Hip Internal rotation 30*  50   Hip External rotation 65  65   Knee Flexion WFL 4  4  Knee Extension Hyperextends  4+ Hyperextends  4+  Ankle Dorsiflexion  4*  4*  Ankle Plantarflexion      Ankle Inversion      Ankle Eversion       (Blank rows = not tested)  * pain  Used towel for MMT   LUMBAR SPECIAL TESTS:  SLR neg B, slump test neg B   FUNCTIONAL TESTS:  bed rolling painful and slow, STS uses arms slow and painful   GAIT: Distance walked: 25 ft Assistive device utilized: None Level of assistance: Modified independence Comments: antalgic gait, wide BOS, limited hip and lumbar motion     TODAY'S TREATMENT  08/14/2021  Therapeutic Exercise:  Aerobic: Supine: Prone: lying 5 minutes, hamstring curls x10 B  Seated:  Standing: Neuromuscular Re-education: Manual Therapy: Therapeutic Activity: Self Care: Trigger Point Dry Needling:  Modalities:  PATIENT EDUCATION:  Education details:  on current presentation, on HEP, on clinical outcomes score and POC Person educated: Patient Education method: Explanation, Demonstration, and Handouts Education comprehension: verbalized understanding    HOME EXERCISE PROGRAM: [id number]  ASSESSMENT:  CLINICAL IMPRESSION: Patient is a 61 y.o. female who was seen today for physical therapy evaluation and treatment for low back pain and bilateral lower leg pain. Symptoms in legs do not seem radicular in nature and all mobility interventions felt good and reduced overall symptoms. Educated patient in finding and POC moving forward. Patient would greatly benefit from skilled PT to improve overall function and QOL.   OBJECTIVE IMPAIRMENTS decreased activity tolerance, decreased mobility, difficulty walking, decreased ROM, decreased strength, increased edema, improper body mechanics, postural dysfunction, and pain.   ACTIVITY LIMITATIONS sitting, standing, squatting, transfers, and bed mobility  PARTICIPATION LIMITATIONS: meal prep, cleaning, shopping, and community activity  PERSONAL FACTORS Age, Fitness, and 1-2 comorbidities: B corn pain, R ankle surgery history    REHAB POTENTIAL: Good  CLINICAL DECISION MAKING: Stable/uncomplicated  EVALUATION COMPLEXITY: Low   GOALS: Goals reviewed with patient?  yes  SHORT TERM GOALS:  Patient will be independent in self management strategies to improve quality of life and functional outcomes. Baseline: new program Target date: 09/04/2021 Goal status: INITIAL  2.  Patient will report at least 50% improvement in overall symptoms and/or function to demonstrate improved functional mobility Baseline: 0% Target date: 09/04/2021 Goal status: INITIAL  3.  Patient will be able to perform pain free bed mobilities Baseline: painful Target date: 09/04/2021 Goal status: INITIAL      LONG TERM GOALS:  Patient will report at least 75% improvement in overall symptoms and/or function to  demonstrate improved functional mobility Baseline: 0% Target date: 09/25/2021 Goal status: INITIAL  2.  Patient will be able to report managing right leg pain with HEP. Baseline: new program Target date: 09/25/2021 Goal status: INITIAL  3.  Patient will be able to demonstrate painfree lumbar ROM Baseline: painful Target date: 09/25/2021 Goal status: INITIAL       PLAN: PT FREQUENCY: 2x/week  PT DURATION: 6 weeks  PLANNED INTERVENTIONS: Therapeutic exercises, Therapeutic activity, Neuromuscular re-education, Balance training, Gait training, Patient/Family education, Joint mobilization, Aquatic Therapy, Dry Needling, Electrical stimulation, Spinal mobilization, Cryotherapy, Moist heat, Ultrasound, Ionotophoresis '4mg'$ /ml Dexamethasone, and Manual therapy.  PLAN FOR NEXT SESSION: hip/back ROM, isometrics, strengthening  1:41 PM, 08/14/21 Jerene Pitch, DPT Physical Therapy with [facility]

## 2021-08-14 ENCOUNTER — Encounter: Payer: Self-pay | Admitting: Physical Therapy

## 2021-08-14 ENCOUNTER — Ambulatory Visit (INDEPENDENT_AMBULATORY_CARE_PROVIDER_SITE_OTHER): Payer: Commercial Managed Care - HMO | Admitting: Physical Therapy

## 2021-08-14 DIAGNOSIS — M5432 Sciatica, left side: Secondary | ICD-10-CM

## 2021-08-14 DIAGNOSIS — M5431 Sciatica, right side: Secondary | ICD-10-CM

## 2021-08-14 DIAGNOSIS — M6281 Muscle weakness (generalized): Secondary | ICD-10-CM

## 2021-08-14 DIAGNOSIS — R262 Difficulty in walking, not elsewhere classified: Secondary | ICD-10-CM

## 2021-08-15 ENCOUNTER — Encounter: Payer: Self-pay | Admitting: Family Medicine

## 2021-08-15 MED ORDER — TRULICITY 0.75 MG/0.5ML ~~LOC~~ SOAJ: 0.7500 mg | SUBCUTANEOUS | 1 refills | Status: DC

## 2021-08-15 NOTE — Addendum Note (Signed)
Addended by: Wellington Hampshire on: 08/15/2021 01:18 PM   Modules accepted: Orders

## 2021-08-15 NOTE — Telephone Encounter (Signed)
Please see pt's message and advise what dose of Trulicity you would like for pt to use?

## 2021-08-18 ENCOUNTER — Ambulatory Visit (HOSPITAL_COMMUNITY)
Admission: EM | Admit: 2021-08-18 | Discharge: 2021-08-18 | Disposition: A | Discharge status: Home / Self Care | Payer: Commercial Managed Care - HMO | Attending: Family Medicine | Admitting: Family Medicine

## 2021-08-18 ENCOUNTER — Encounter (HOSPITAL_COMMUNITY): Payer: Self-pay | Admitting: Emergency Medicine

## 2021-08-18 DIAGNOSIS — E11628 Type 2 diabetes mellitus with other skin complications: Secondary | ICD-10-CM | POA: Diagnosis not present

## 2021-08-18 DIAGNOSIS — L089 Local infection of the skin and subcutaneous tissue, unspecified: Secondary | ICD-10-CM

## 2021-08-18 MED ORDER — CLINDAMYCIN HCL 300 MG PO CAPS: 300.0000 mg | ORAL_CAPSULE | Freq: Three times a day (TID) | ORAL | 0 refills | Status: AC

## 2021-08-18 MED ORDER — KETOROLAC TROMETHAMINE 30 MG/ML IJ SOLN
30.0000 mg | Freq: Once | INTRAMUSCULAR | Status: AC
  Administered 2021-08-18: 30 mg via INTRAMUSCULAR

## 2021-08-18 MED ORDER — KETOROLAC TROMETHAMINE 30 MG/ML IJ SOLN
INTRAMUSCULAR | Status: AC
Start: 2021-08-18 — End: ?
  Filled 2021-08-18: qty 1

## 2021-08-18 MED ORDER — TRAMADOL HCL 50 MG PO TABS
50.0000 mg | ORAL_TABLET | Freq: Four times a day (QID) | ORAL | 0 refills | Status: DC | PRN
Start: 2021-08-18 — End: 2021-10-03

## 2021-08-18 NOTE — ED Triage Notes (Signed)
Patient c/o right 3rd toe redness and pain x 3 days.  No injury, tender to touch and drainage.  Patient has taken Naprosyn for the discomfort.

## 2021-08-18 NOTE — ED Provider Notes (Signed)
[facility]    CSN: [id number] Arrival date & time: 08/18/21  1625      History   Chief Complaint Chief Complaint  Patient presents with   Toe Pain    HPI Leah Powers is a 61 y.o. female.    Toe Pain  Here for pain and swelling in her third toe on her right foot.  She first noticed it on May 31.  She has not had any fever or chills.  It is draining some now.  She has diabetes, fairly well controlled.  Her last A1c was 6.9.  She has multiple drug allergies including sulfa, penicillins, cephalosporins, and tetracyclines  She does not recall her reaction to clindamycin in the past  She also has multiple intolerances to pain relief.  She does not recall a reaction to tramadol in the past   Past Medical History:  Diagnosis Date   Allergy    Arthritis    L hand, R ankle  (10/08/2016)   Bipolar 1 disorder ([facility])    Depression    Diabetes mellitus without complication ([place])    Fibromyalgia    told that the breakout on the upper back , could be over active nerves , also treated with cream & Gabapentin- [facility] [place]    History of kidney stones    spontaneous passing   Hypertension    Hypothyroid    Pneumonia ~ 1967; 07/2016   Pyelonephritis 1997   treated with IV antibiotic     Patient Active Problem List   Diagnosis Date Noted   Nausea vomiting and diarrhea 05/20/2018   AKI (acute kidney injury) ([place]) 05/20/2018   Lithium toxicity 05/20/2018   Type 2 diabetes mellitus without complication, without long-term current use of insulin ([place]) 05/20/2018   Bipolar I disorder ([place]) 02/04/2018   S/P ankle fusion 11/12/2016   Posterior tibial tendon dysfunction (PTTD) of right lower extremity 10/08/2016   HTN (hypertension) 05/31/2014   Hypothyroidism 05/31/2014    Past Surgical History:  Procedure Laterality Date   BLADDER INSTILLATION  X 2   CHALAZION EXCISION Bilateral    R- eye X2 , L eye- X1   FOOT ARTHRODESIS Right 10/08/2016    Procedure: RIGHT TRIPLE ARTHRODESIS;  Surgeon: Newt Minion, MD;  Location: [facility];  Service: Orthopedics;  Laterality: Right;   FOOT ARTHRODESIS, TRIPLE Right 10/08/2016   ankle   FRENULECTOMY, LINGUAL  1970s   LAPAROSCOPIC CHOLECYSTECTOMY  1996   PALATE SURGERY  1970s   tissue removed from upper palate as a child    URETHRAL DILATION      OB History   No obstetric history on file.      Home Medications    Prior to Admission medications   Medication Sig Start Date End Date Taking? Authorizing Provider  albuterol (VENTOLIN HFA) 108 (90 Base) MCG/ACT inhaler Inhale 2 puffs into the lungs every 6 (six) hours as needed for wheezing or shortness of breath. 04/01/21  Yes Allwardt, Alyssa M, PA-C  cetirizine (ZYRTEC) 5 MG tablet Take 5 mg by mouth 2 (two) times daily as needed for allergies.    Yes [provider]  clindamycin (CLEOCIN) 300 MG capsule Take 1 capsule (300 mg total) by mouth 3 (three) times daily for 7 days. 08/18/21 08/25/21 Yes Shya Kovatch, Gwenlyn Perking, MD  Dulaglutide (TRULICITY) 6.59 DJ/5.7SV SOPN Inject 0.75 mg into the skin once a week. 08/15/21  Yes Tawnya Crook, MD  Elastic Bandages & Supports (MEDICAL  COMPRESSION SOCKS) MISC Wear compression stockings daily with upright activity. Remove at bedtime. 04/01/21  Yes Allwardt, Alyssa M, PA-C  FARXIGA 5 MG TABS tablet Take 1 tablet by mouth once daily 07/29/21  Yes Allwardt, Alyssa M, PA-C  furosemide (LASIX) 20 MG tablet Take 1 tablet (20 mg total) by mouth daily. 08/01/21  Yes Tawnya Crook, MD  gabapentin (NEURONTIN) 300 MG capsule Take as directed ('300mg'$  5 times a day) 12/27/20  Yes Sheffield, [place] R, PA-C  Lancets (ONETOUCH DELICA PLUS [id number]) MISC Inject 1 Stick into the skin daily. 04/01/21  Yes Allwardt, Alyssa M, PA-C  levothyroxine (SYNTHROID) 125 MCG tablet TAKE ONE TABLET BY MOUTH DAILY BEFORE BREAKFAST 08/01/21  Yes Tawnya Crook, MD  lithium carbonate 300 MG capsule Take 3 capsules (900 mg total) by  mouth at bedtime. 06/06/21  Yes Hurst, Helene Kelp T, PA-C  losartan (COZAAR) 50 MG tablet Take 1 tablet (50 mg total) by mouth daily. 08/01/21  Yes Tawnya Crook, MD  mupirocin ointment Drue Stager) 2 % Apply topical to aa qd 05/15/21  Yes Lavonna Monarch, MD  naproxen sodium (ALEVE) 220 MG tablet Take 220 mg by mouth daily as needed (pain). 03/17/18  Yes [provider]  ONETOUCH VERIO test strip 1 each by Other route daily. 04/01/21  Yes Allwardt, Alyssa M, PA-C  traMADol (ULTRAM) 50 MG tablet Take 1 tablet (50 mg total) by mouth every 6 (six) hours as needed. 08/18/21  Yes Barrett Henle, MD  rosuvastatin (CRESTOR) 20 MG tablet Take 1 tablet (20 mg total) by mouth daily. 04/01/21 08/01/21  Allwardt, Randa Evens, PA-C    Family History Family History  Problem Relation Age of Onset   Sleep apnea Father    Hypertension Father    Diabetes Sister    Hypertension Sister    Asthma Sister    Breast cancer Maternal Grandmother 70   Cancer Paternal Grandfather    Breast cancer Maternal Aunt 65    Social History Social History   Tobacco Use   Smoking status: Never   Smokeless tobacco: Never  Vaping Use   Vaping Use: Never used  Substance Use Topics   Alcohol use: No   Drug use: No     Allergies   Eucalyptus oil, Haldol [haloperidol decanoate], Molds & smuts, Other, Penicillins, Bee venom, Corn oil, Corn-containing products, Demeclocycline, Diflunisal, Haloperidol lactate, Hydrochlorothiazide, Hydrocodone-acetaminophen, Imipramine, Metformin, Metformin and related, Nitrofurantoin, Penicillin g, Sulfamethoxazole-trimethoprim, Tetracycline hcl, Vicodin [hydrocodone-acetaminophen], Cephalexin, Doxycycline, Erythromycin, Macrodantin, Motrin [ibuprofen], Septra [bactrim], Tetracyclines & related, and Tofranil-pm   Review of Systems Review of Systems   Physical Exam Triage Vital Signs ED Triage Vitals  Enc Vitals Group     BP 08/18/21 1701 116/73     Pulse Rate 08/18/21 1701 73      Resp 08/18/21 1701 18     Temp 08/18/21 1701 98.3 F (36.8 C)     Temp Source 08/18/21 1701 Oral     SpO2 08/18/21 1701 99 %     Weight 08/18/21 1702 230 lb (104.3 kg)     Height 08/18/21 1702 5' 7.5" (1.715 m)     Head Circumference --      Peak Flow --      Pain Score 08/18/21 1701 9     Pain Loc --      Pain Edu? --      Excl. in [place]? --    No data found.  Updated Vital Signs BP 116/73 (BP Location: Right Arm)  Pulse 73   Temp 98.3 F (36.8 C) (Oral)   Resp 18   Ht 5' 7.5" (1.715 m)   Wt 104.3 kg   LMP 07/30/2013   SpO2 99%   BMI 35.49 kg/m   Visual Acuity Right Eye Distance:   Left Eye Distance:   Bilateral Distance:    Right Eye Near:   Left Eye Near:    Bilateral Near:     Physical Exam Vitals reviewed.  Constitutional:      General: She is not in acute distress.    Appearance: She is not ill-appearing, toxic-appearing or diaphoretic.  Musculoskeletal:     Comments: She has swelling and erythema of the end of her right third toe.  It has purulent drainage over the medial aspect.  It is very tender.  Neurological:     Mental Status: She is alert and oriented to person, place, and time.  Psychiatric:        Behavior: Behavior normal.     UC Treatments / Results  Labs (all labs ordered are listed, but only abnormal results are displayed) Labs Reviewed - No data to display  EKG   Radiology No results found.  Procedures Procedures (including critical care time)  Medications Ordered in UC Medications  ketorolac (TORADOL) 30 MG/ML injection 30 mg (has no administration in time range)    Initial Impression / Assessment and Plan / UC Course  I have reviewed the triage vital signs and the nursing notes.  Pertinent labs & imaging results that were available during my care of the patient were reviewed by me and considered in my medical decision making (see chart for details).     She has infection in her foot with a history of diabetes.  Her  vital signs are reassuring and her clinical appearance is reassuring.  We will begin oral antibiotics with clindamycin today.  I have asked her to contact her primary care office to see them hopefully in the next 2 to 3 days, so that if she is not improving they can decide what else she needs as far as evaluation and treatment Final Clinical Impressions(s) / UC Diagnoses   Final diagnoses:  Diabetic infection of right foot [facility])     Discharge Instructions      You have been given a shot of Toradol 30 mg today for pain  Clindamycin 300 mg--1 capsule 3 times daily for 7 days; this is your antibiotic.  Please start it tonight  Tramadol 50 mg--1 every 6 hours as needed for pain.  This could make you sleepy or dizzy  Call your primary care office tomorrow morning     ED Prescriptions     Medication Sig Dispense Auth. Provider   clindamycin (CLEOCIN) 300 MG capsule Take 1 capsule (300 mg total) by mouth 3 (three) times daily for 7 days. 21 capsule Barrett Henle, MD   traMADol (ULTRAM) 50 MG tablet Take 1 tablet (50 mg total) by mouth every 6 (six) hours as needed. 10 tablet Windy Carina Gwenlyn Perking, MD      I have reviewed the PDMP during this encounter.   Barrett Henle, MD 08/18/21 [phone]

## 2021-08-18 NOTE — Discharge Instructions (Addendum)
You have been given a shot of Toradol 30 mg today for pain  Clindamycin 300 mg--1 capsule 3 times daily for 7 days; this is your antibiotic.  Please start it tonight  Tramadol 50 mg--1 every 6 hours as needed for pain.  This could make you sleepy or dizzy  Call your primary care office tomorrow morning

## 2021-08-19 ENCOUNTER — Ambulatory Visit (INDEPENDENT_AMBULATORY_CARE_PROVIDER_SITE_OTHER): Payer: Commercial Managed Care - HMO | Admitting: Family Medicine

## 2021-08-19 ENCOUNTER — Encounter: Payer: Self-pay | Admitting: Family Medicine

## 2021-08-19 ENCOUNTER — Telehealth: Payer: Self-pay | Admitting: Family Medicine

## 2021-08-19 ENCOUNTER — Encounter: Payer: Commercial Managed Care - HMO | Admitting: Physical Therapy

## 2021-08-19 VITALS — BP 120/76 | HR 69 | Temp 98.4°F | Ht 67.5 in | Wt 248.1 lb

## 2021-08-19 DIAGNOSIS — L03031 Cellulitis of right toe: Secondary | ICD-10-CM

## 2021-08-19 NOTE — Telephone Encounter (Signed)
FYI  Patient scheduled at 1pm.

## 2021-08-19 NOTE — Patient Instructions (Signed)
Continue clindamycin and tramadol if needed for pain See podiatry within 2 days  If worse, fevers, etc-to ER

## 2021-08-19 NOTE — Telephone Encounter (Signed)
Patient seeing dr Cherlynn Kaiser today at 1pm -   patient Name: Leah Powers IS Gender: Female DOB: Oct 09, 1960 Age: 61 Y 7 M 44 D Return Phone Number: [phone] (Primary), [phone] (Secondary) Address: City/ State/ Zip: [place] Leah Powers  [id number] Client Leah Powers at [place] Client Site [place] [facility] [employer] Type Call Who Is Calling Patient / Member / Family / Caregiver Call Type Triage / Clinical Relationship To Patient Self Return Phone Number [phone] (Primary) Chief Complaint Toe Injury Reason for Call Symptomatic / Request for [place] states that she is diabetic because she has an infected toe that's oozing and is very painful and she said it is hard for her to stand. Caller states that she has soaked it and put an ice pack on there. Caller states that she vomited and had pre diarrhea all day yesterday. Translation No Nurse Assessment Nurse: Leah Gibney, RN, [place] Date/Time (Eastern Time): 08/18/2021 1:17:01 PM Confirm and document reason for call. If symptomatic, describe symptoms. ---Caller states she is diabetic. 3rd toe on right foot is oozing pus and red. has pain and it is hard to walk she vomited yesterday and had diarrhea yesterday. Ate supper last night and breakfast with no nausea or vomiting. Temp 97.5. Does the patient have any new or worsening symptoms? ---Yes Will a triage be completed? ---Yes Related visit to physician within the last 2 weeks? ---No Does the PT have any chronic conditions? (i.e. diabetes, asthma, this includes High risk factors for pregnancy, etc.) ---Yes List chronic conditions. ---diabetes; IBS Is this a behavioral health or substance abuse call? ---No Guidelines Guideline Title Affirmed Question Affirmed Notes Nurse Date/Time (Eastern Time) Diabetes - Foot Problems and Questions [1] Drainage from foot AND [2] new or increased Leah Gibney,  RN, Leah Powers 08/18/2021 1:24:08 PM PLEASE NOTE: All timestamps contained within this report are represented as [place] Standard Time. CONFIDENTIALTY NOTICE: This fax transmission is intended only for the addressee. It contains information that is legally privileged, confidential or otherwise protected from use or disclosure. If you are not the intended recipient, you are strictly prohibited from reviewing, disclosing, copying using or disseminating any of this information or taking any action in reliance on or regarding this information. If you have received this fax in error, please notify us immediately by telephone so that we can arrange for its return to [place]. Phone: [phone], Toll-Free: [phone], Fax: [phone] Page: 2 of 2 Call Id: [id number] [place]. Time Leah Powers Time) Disposition Final User 08/18/2021 1:18:13 PM Attempt made - message left Leah Gibney, RN, Leah Powers 08/18/2021 1:31:45 PM See HCP within 4 Hours (or PCP triage) Yes Leah Gibney, RN, Leah Powers Disagree/Comply Comply Caller Understands Yes PreDisposition Go to [facility] Advice Given Per Guideline SEE HCP (OR PCP TRIAGE) WITHIN 4 HOURS: * IF OFFICE WILL BE CLOSED AND NO PCP (PRIMARY CARE PROVIDER) SECOND-LEVEL TRIAGE: You need to be seen within the next 3 or 4 hours. A nearby Urgent Care Center [facility]) is often a good source of care. Another choice is to go to the ED. Go sooner if you become worse. * You become worse CALL BACK IF: CARE ADVICE given per Diabetes - Foot Problems and Questions (Adult) guideline. Comments User: Leah Burn, RN Date/Time Leah Powers Time): 08/18/2021 1:18:02 PM Called primary number and left message. will try secondary number. Referrals [facility] [place] [facility] [place]

## 2021-08-19 NOTE — Therapy (Deleted)
OUTPATIENT PHYSICAL THERAPY TREATMENT NOTE   Patient Name: Leah Powers MRN: [id number] DOB:03-22-1960, 61 y.o., female Today's Date: 08/19/2021   END OF SESSION:    Past Medical History:  Diagnosis Date   Allergy    Arthritis    L hand, R ankle  (10/08/2016)   Bipolar 1 disorder ([place])    Depression    Diabetes mellitus without complication ([place])    Fibromyalgia    told that the breakout on the upper back , could be over active nerves , also treated with cream & Gabapentin- [facility] [place]    History of kidney stones    spontaneous passing   Hypertension    Hypothyroid    Pneumonia ~ 1967; 07/2016   Pyelonephritis 1997   treated with IV antibiotic    Past Surgical History:  Procedure Laterality Date   BLADDER INSTILLATION  X 2   CHALAZION EXCISION Bilateral    R- eye X2 , L eye- X1   FOOT ARTHRODESIS Right 10/08/2016   Procedure: RIGHT TRIPLE ARTHRODESIS;  Surgeon: Newt Minion, MD;  Location: [facility];  Service: Orthopedics;  Laterality: Right;   FOOT ARTHRODESIS, TRIPLE Right 10/08/2016   ankle   FRENULECTOMY, LINGUAL  1970s   LAPAROSCOPIC CHOLECYSTECTOMY  1996   PALATE SURGERY  1970s   tissue removed from upper palate as a child    URETHRAL DILATION     Patient Active Problem List   Diagnosis Date Noted   Nausea vomiting and diarrhea 05/20/2018   AKI (acute kidney injury) ([place]) 05/20/2018   Lithium toxicity 05/20/2018   Type 2 diabetes mellitus without complication, without long-term current use of insulin ([place]) 05/20/2018   Bipolar I disorder ([place]) 02/04/2018   S/P ankle fusion 11/12/2016   Posterior tibial tendon dysfunction (PTTD) of right lower extremity 10/08/2016   HTN (hypertension) 05/31/2014   Hypothyroidism 05/31/2014   PCP: Tawnya Crook, MD     REFERRING PROVIDER: Tawnya Crook, MD     REFERRING DIAG: [phone] (ICD-10-CM) - Bilateral sciatica   Rationale for Evaluation and Treatment Rehabilitation   THERAPY DIAG:   Bilateral sciatica   Muscle weakness (generalized)   Difficulty in walking, not elsewhere classified   ONSET DATE: chronic, right leg over the last month   SUBJECTIVE:                                                                                                                                                                                            SUBJECTIVE STATEMENT: 08/19/2021 ***   Eval: States she had a fall back in 12' and she has  always had issues in the left hip. States it has intensified more since more as of late. States that the weather makes her feels terrible. She has been on prednisone and it helped a little bit. Reports that the pain has increased and is now going down her right leg and has gotten worse over the last month. States she has a 4 prong cane that she uses when she is in a lot of pain. She also has pain in her feet as she has corns and is waiting to have surgery to help with that. States that she has used her massager with minimal relief. Wants to learn what she can do for the pain.   PERTINENT HISTORY:  hx r ankle surgery, fibromyalgia, DB, bipolar, depression   PAIN:  Are you having pain? Yes: NPRS scale: 6/10 Pain location: left leg Pain description: dull achy Aggravating factors: sit to stand, standing, laying left side Relieving factors: rest     PRECAUTIONS: None   WEIGHT BEARING RESTRICTIONS No   FALLS:  Has patient fallen in last 6 months? No   LIVING ENVIRONMENT: Lives with: lives with their family Has following equipment at home: quad cane   OCCUPATION: not currently working   PLOF: Independent    PATIENT GOALS learn ways to reduce pressure and pain in back. Improve static postures/posiitoning     OBJECTIVE:    DIAGNOSTIC FINDINGS:  No recent images     SCREENING FOR RED FLAGS: Bowel or bladder incontinence: No Spinal tumors: No Cauda equina syndrome: No Compression fracture: No Abdominal aneurysm: No   COGNITION:            Overall cognitive status: Within functional limits for tasks assessed                              MUSCLE LENGTH: Hamstrings: Right 0 deg; Left 0 deg     POSTURE/Swelling:  hyper extends bilateral knees, increased B swelling - wearing compression stocking, forward head   PALPATION:  tenderness in legs due to swelling R>L, increased tension in lumbar paraspinals - felt good with palpation   LUMBAR ROM:    Active  A/PROM  eval  Flexion 50% limited  Extension 100% limited  Right lateral flexion 25% limited  Left lateral flexion 75% limited and painful  Right rotation    Left rotation     (Blank rows = not tested)                  LE Measurements       Lower Extremity Right 08/14/2021 Left 08/14/2021    A/PROM MMT A/PROM MMT  Hip Flexion WFL 4 WFL 4  Hip Extension          Hip Abduction   4-   3+*  Hip Adduction          Hip Internal rotation 30*   50    Hip External rotation 65   65    Knee Flexion WFL 4   4  Knee Extension Hyperextends  4+ Hyperextends  4+  Ankle Dorsiflexion   4*   4*  Ankle Plantarflexion          Ankle Inversion          Ankle Eversion           (Blank rows = not tested)            * pain  Used towel for MMT    LUMBAR SPECIAL TESTS:  SLR neg B, slump test neg B    FUNCTIONAL TESTS:  bed rolling painful and slow, STS uses arms slow and painful     GAIT: Distance walked: 25 ft Assistive device utilized: None Level of assistance: Modified independence Comments: antalgic gait, wide BOS, limited hip and lumbar motion        TODAY'S TREATMENT  08/19/2021    Therapeutic Exercise:    Aerobic: Supine: Prone: lying 5 minutes, hamstring curls x10 B    Seated:    Standing: Neuromuscular Re-education: Manual Therapy: Therapeutic Activity: Self Care: Trigger Point Dry Needling:  Modalities:        PATIENT EDUCATION:  Education details: on current presentation, on HEP, on clinical outcomes score and POC Person  educated: Patient Education method: Explanation, Demonstration, and Handouts Education comprehension: verbalized understanding       HOME EXERCISE PROGRAM: [id number]   ASSESSMENT:   CLINICAL IMPRESSION: 08/19/2021 ***   Eval: Patient is a 61 y.o. female who was seen today for physical therapy evaluation and treatment for low back pain and bilateral lower leg pain. Symptoms in legs do not seem radicular in nature and all mobility interventions felt good and reduced overall symptoms. Educated patient in finding and POC moving forward. Patient would greatly benefit from skilled PT to improve overall function and QOL.     OBJECTIVE IMPAIRMENTS decreased activity tolerance, decreased mobility, difficulty walking, decreased ROM, decreased strength, increased edema, improper body mechanics, postural dysfunction, and pain.    ACTIVITY LIMITATIONS sitting, standing, squatting, transfers, and bed mobility   PARTICIPATION LIMITATIONS: meal prep, cleaning, shopping, and community activity   PERSONAL FACTORS Age, Fitness, and 1-2 comorbidities: B corn pain, R ankle surgery history     REHAB POTENTIAL: Good   CLINICAL DECISION MAKING: Stable/uncomplicated   EVALUATION COMPLEXITY: Low     GOALS: Goals reviewed with patient?  yes   SHORT TERM GOALS:   Patient will be independent in self management strategies to improve quality of life and functional outcomes. Baseline: new program Target date: 09/04/2021 Goal status: INITIAL   2.  Patient will report at least 50% improvement in overall symptoms and/or function to demonstrate improved functional mobility Baseline: 0% Target date: 09/04/2021 Goal status: INITIAL   3.  Patient will be able to perform pain free bed mobilities Baseline: painful Target date: 09/04/2021 Goal status: INITIAL           LONG TERM GOALS:   Patient will report at least 75% improvement in overall symptoms and/or function to demonstrate improved functional  mobility Baseline: 0% Target date: 09/25/2021 Goal status: INITIAL   2.  Patient will be able to report managing right leg pain with HEP. Baseline: new program Target date: 09/25/2021 Goal status: INITIAL   3.  Patient will be able to demonstrate painfree lumbar ROM Baseline: painful Target date: 09/25/2021 Goal status: INITIAL             PLAN: PT FREQUENCY: 2x/week   PT DURATION: 6 weeks   PLANNED INTERVENTIONS: Therapeutic exercises, Therapeutic activity, Neuromuscular re-education, Balance training, Gait training, Patient/Family education, Joint mobilization, Aquatic Therapy, Dry Needling, Electrical stimulation, Spinal mobilization, Cryotherapy, Moist heat, Ultrasound, Ionotophoresis '4mg'$ /ml Dexamethasone, and Manual therapy.   PLAN FOR NEXT SESSION: hip/back ROM, isometrics, strengthening   8:37 AM, 08/19/21 Jerene Pitch, DPT Physical Therapy with [facility]

## 2021-08-19 NOTE — Progress Notes (Signed)
Subjective:     Patient ID: Leah Powers, female    DOB: 28-Mar-1960, 61 y.o.   MRN: [id number]  Chief Complaint  Patient presents with   Pain    Pain in right foot 3rd toe that started Aug 14, 2021 Went to [facility] on yesterday, prescribed medications    HPI Went to [facility] yest-placed on clinda and tramadol Pain R 3rd toe since 3/31.  Very red and then some drainage yesterday.  Went to ER Pain has improved.  Less swelling.  Pus still.  No f/c.  V/d 2 days ago.   Sugars not better nor worse.   PT helped LUQ pain  Health Maintenance Due  Topic Date Due   OPHTHALMOLOGY EXAM  Never done   COLONOSCOPY (Pts 45-23yr Insurance coverage will need to be confirmed)  Never done   PAP SMEAR-Modifier  01/20/2020    Past Medical History:  Diagnosis Date   Allergy    Arthritis    L hand, R ankle  (10/08/2016)   Bipolar 1 disorder ([facility])    Depression    Diabetes mellitus without complication ([place]    Fibromyalgia    told that the breakout on the upper back , could be over active nerves , also treated with cream & Gabapentin- [facility] [place]    History of kidney stones    spontaneous passing   Hypertension    Hypothyroid    Pneumonia ~ 1967; 07/2016   Pyelonephritis 1997   treated with IV antibiotic     Past Surgical History:  Procedure Laterality Date   BLADDER INSTILLATION  X 2   CHALAZION EXCISION Bilateral    R- eye X2 , L eye- X1   FOOT ARTHRODESIS Right 10/08/2016   Procedure: RIGHT TRIPLE ARTHRODESIS;  Surgeon: DNewt Minion MD;  [facility] [facility]  Service: Orthopedics;  Laterality: Right;   FOOT ARTHRODESIS, TRIPLE Right 10/08/2016   ankle   FRENULECTOMY, LINGUAL  1970s   LAPAROSCOPIC CHOLECYSTECTOMY  1996   PALATE SURGERY  1970s   tissue removed from upper palate as a child    URETHRAL DILATION      Outpatient Medications Prior to Visit  Medication Sig Dispense Refill   albuterol (VENTOLIN HFA) 108 (90 Base) MCG/ACT inhaler Inhale 2 puffs into the lungs  every 6 (six) hours as needed for wheezing or shortness of breath. 1 each 2   cetirizine (ZYRTEC) 5 MG tablet Take 5 mg by mouth 2 (two) times daily as needed for allergies.      clindamycin (CLEOCIN) 300 MG capsule Take 1 capsule (300 mg total) by mouth 3 (three) times daily for 7 days. 21 capsule 0   Dulaglutide (TRULICITY) 01.54MMG/8.6PYSOPN Inject 0.75 mg into the skin once a week. 6 mL 1   Elastic Bandages & Supports (MEDICAL COMPRESSION SOCKS) MISC Wear compression stockings daily with upright activity. Remove at bedtime. 2 each 0   EXTRA STRENGTH ACETAMINOPHEN PO      FARXIGA 5 MG TABS tablet Take 1 tablet by mouth once daily 30 tablet 0   furosemide (LASIX) 20 MG tablet Take 1 tablet (20 mg total) by mouth daily. 90 tablet 3   gabapentin (NEURONTIN) 300 MG capsule Take as directed ('300mg'$  5 times a day) 450 capsule 4   Lancets (ONETOUCH DELICA PLUS [id number] MISC Inject 1 Stick into the skin daily. 100 each 2   levothyroxine (SYNTHROID) 125 MCG tablet TAKE ONE TABLET BY MOUTH DAILY BEFORE BREAKFAST 90  tablet 1   lithium carbonate 300 MG capsule Take 3 capsules (900 mg total) by mouth at bedtime. 270 capsule 0   losartan (COZAAR) 50 MG tablet Take 1 tablet (50 mg total) by mouth daily. 90 tablet 3   mupirocin ointment (BACTROBAN) 2 % Apply topical to aa qd 22 g 3   naproxen sodium (ALEVE) 220 MG tablet Take 220 mg by mouth daily as needed (pain).     ONETOUCH VERIO test strip 1 each by Other route daily. 100 each 3   traMADol (ULTRAM) 50 MG tablet Take 1 tablet (50 mg total) by mouth every 6 (six) hours as needed. 10 tablet 0   rosuvastatin (CRESTOR) 20 MG tablet Take 1 tablet (20 mg total) by mouth daily. 90 tablet 1   No facility-administered medications prior to visit.    Allergies  Allergen Reactions   Eucalyptus Oil Shortness Of Breath    Other reaction(s): flush, cant breath   Haldol [Haloperidol Decanoate] Other (See Comments)    Drooling, weaving in floor, parkinson's  syndrome (couldn't eat or talk) , pt was hospitalized    Molds & Smuts Shortness Of Breath and Itching   Other Anaphylaxis, Shortness Of Breath, Itching and Swelling    Bleu cheese    Penicillins Anaphylaxis and Rash    Pt was 61 years old  Has patient had a PCN reaction causing immediate rash, facial/tongue/throat swelling, SOB or lightheadedness with hypotension: Yes Has patient had a PCN reaction causing severe rash involving mucus membranes or skin necrosis: Unknown Has patient had a PCN reaction that required hospitalization: No Has patient had a PCN reaction occurring within the last 10 years: No If all of the above answers are "NO", then may proceed with Cephalosporin use.    Bee Venom Other (See Comments)    Yellow jackets and wasps-stung by 47, MD told pt that she wouldn't have resistance if stung again    Corn Oil     Other reaction(s): diarrhea, lots   Corn-Containing Products Nausea And Vomiting and Other (See Comments)    Stomach distress    Demeclocycline Other (See Comments)   Diflunisal Other (See Comments)    Upset stomach Other reaction(s): Unknown   Haloperidol Lactate     Other reaction(s): stumble around, droul   Hydrochlorothiazide     Other reaction(s): elevated lithium level   Hydrocodone-Acetaminophen     Other reaction(s): abd pain   Imipramine     Other reaction(s): rash   Metformin     Other reaction(s): stomach upset   Metformin And Related    Nitrofurantoin     Other reaction(s): rash   Penicillin G     Other reaction(s): rash   Sulfamethoxazole-Trimethoprim     Other reaction(s): rash   Tetracycline Hcl     Other reaction(s): rash   Vicodin [Hydrocodone-Acetaminophen] Diarrhea and Other (See Comments)    Upset stomach   Cephalexin Rash    Other reaction(s): diarrhea   Doxycycline Rash    Other reaction(s): rash   Erythromycin Rash    Other reaction(s): rash   Macrodantin Rash   Motrin [Ibuprofen] Palpitations   Septra [Bactrim] Rash    Tetracyclines & Related Rash   Tofranil-Pm Rash   ROS neg/noncontributory except as noted HPI/below      Objective:     BP 120/76   Pulse 69   Temp 98.4 F (36.9 C) (Temporal)   Ht 5' 7.5" (1.715 m)   Wt 248 lb 2 oz (112.5  kg)   LMP 07/30/2013   SpO2 96%   BMI 38.29 kg/m  Wt Readings from Last 3 Encounters:  08/19/21 248 lb 2 oz (112.5 kg)  08/18/21 230 lb (104.3 kg)  08/05/21 224 lb 8 oz (101.8 kg)    Physical Exam   Gen: WDWN NAD owf HEENT: NCAT, conjunctiva not injected, sclera nonicteric EXT: chronic edema MSK: no gross abnormalities.  NEURO: A&O x3.  CN II-XII intact.  PSYCH: normal mood. Good eye contact R foot-DP palp.  3rd toe-hammar w/callus on tip and thickened nail.  Redness dip to end.  Sl tender.  Large, emacerated area mostly medial side of tip.  No current drainage.    Reviewed uc note     Assessment & Plan:   Problem List Items Addressed This Visit   None Visit Diagnoses     Cellulitis of right toe    -  Primary      Cellulitis R 3rd toe.  Hammar toe/callus/DM- improved.  Finish clinda.  Has pod-make appt f/u 1-2 days.  May need debridement.  Will need long term continuing care-hammar toes w/callus in a DM pt.  If worsening,  to ER-as mult drug allergies and failure of  out pt tx CMA assisted/taught pt Trulicity injection  No orders of the defined types were placed in this encounter.   Wellington Hampshire, MD

## 2021-08-22 ENCOUNTER — Encounter: Payer: Commercial Managed Care - HMO | Admitting: Physical Therapy

## 2021-08-26 ENCOUNTER — Encounter: Payer: Commercial Managed Care - HMO | Admitting: Physical Therapy

## 2021-08-28 ENCOUNTER — Other Ambulatory Visit: Payer: Self-pay | Admitting: Physician Assistant

## 2021-08-28 NOTE — Telephone Encounter (Signed)
Is this ok to refill?   Last visit: 08/19/21 Leah Powers  Next Visit: 01/31/22 Leah Powers  Last filled: 07/29/21  Quantity: 30 tablets w/ 0 refills

## 2021-08-29 ENCOUNTER — Encounter: Payer: Commercial Managed Care - HMO | Admitting: Physical Therapy

## 2021-09-02 ENCOUNTER — Encounter: Payer: Commercial Managed Care - HMO | Admitting: Physical Therapy

## 2021-09-05 ENCOUNTER — Other Ambulatory Visit (INDEPENDENT_AMBULATORY_CARE_PROVIDER_SITE_OTHER): Payer: Commercial Managed Care - HMO

## 2021-09-05 ENCOUNTER — Encounter: Payer: Commercial Managed Care - HMO | Admitting: Physical Therapy

## 2021-09-05 DIAGNOSIS — D508 Other iron deficiency anemias: Secondary | ICD-10-CM | POA: Diagnosis not present

## 2021-09-05 LAB — CBC WITH DIFFERENTIAL/PLATELET
Basophils Absolute: 0.1 10*3/uL (ref 0.0–0.1)
Basophils Relative: 0.9 % (ref 0.0–3.0)
Eosinophils Absolute: 0.6 10*3/uL (ref 0.0–0.7)
Eosinophils Relative: 6.8 % — ABNORMAL HIGH (ref 0.0–5.0)
HCT: 35.5 % — ABNORMAL LOW (ref 36.0–46.0)
Hemoglobin: 10.7 g/dL — ABNORMAL LOW (ref 12.0–15.0)
Lymphocytes Relative: 18 % (ref 12.0–46.0)
Lymphs Abs: 1.7 10*3/uL (ref 0.7–4.0)
MCHC: 30.1 g/dL (ref 30.0–36.0)
MCV: 75.3 fl — ABNORMAL LOW (ref 78.0–100.0)
Monocytes Absolute: 0.5 10*3/uL (ref 0.1–1.0)
Monocytes Relative: 5 % (ref 3.0–12.0)
Neutro Abs: 6.4 10*3/uL (ref 1.4–7.7)
Neutrophils Relative %: 69.3 % (ref 43.0–77.0)
Platelets: 380 10*3/uL (ref 150.0–400.0)
RBC: 4.71 Mil/uL (ref 3.87–5.11)
RDW: 17 % — ABNORMAL HIGH (ref 11.5–15.5)
WBC: 9.2 10*3/uL (ref 4.0–10.5)

## 2021-09-05 LAB — IBC + FERRITIN
Ferritin: 3.5 ng/mL — ABNORMAL LOW (ref 10.0–291.0)
Iron: 34 ug/dL — ABNORMAL LOW (ref 42–145)
Saturation Ratios: 5.9 % — ABNORMAL LOW (ref 20.0–50.0)
TIBC: 574 ug/dL — ABNORMAL HIGH (ref 250.0–450.0)
Transferrin: 410 mg/dL — ABNORMAL HIGH (ref 212.0–360.0)

## 2021-09-05 LAB — VITAMIN B12: Vitamin B-12: 805 pg/mL (ref 211–911)

## 2021-09-10 ENCOUNTER — Encounter: Payer: Commercial Managed Care - HMO | Admitting: Physical Therapy

## 2021-09-12 ENCOUNTER — Encounter: Payer: Commercial Managed Care - HMO | Admitting: Physical Therapy

## 2021-09-16 ENCOUNTER — Encounter: Payer: Commercial Managed Care - HMO | Admitting: Physical Therapy

## 2021-09-18 ENCOUNTER — Encounter: Payer: Commercial Managed Care - HMO | Admitting: Physical Therapy

## 2021-09-21 ENCOUNTER — Encounter: Payer: Self-pay | Admitting: Family Medicine

## 2021-09-23 NOTE — Telephone Encounter (Signed)
Per patient, she does not need a FU with Dr Cherlynn Kaiser. Stated she needs to repeat her labs. Stated it was on her AVS- She recently came in for labs on 6/20- Please advise.   Thank you   _Lucia C

## 2021-09-23 NOTE — Telephone Encounter (Signed)
Per chart review, pt doesn't need to come back for any labs. She already had the 4-8 weeks labs on 6/22, spoke with pt and explained that. She wants to make Dr. Cherlynn Kaiser aware that she has her GI ppt scheduled for 7/13 with [facility]. She also ask if she due for PE and per chart review she does, transferred her to [place] at the front desk to schedule her PE.

## 2021-09-27 LAB — HM DIABETES EYE EXAM

## 2021-09-30 ENCOUNTER — Encounter: Payer: Self-pay | Admitting: Physical Therapy

## 2021-09-30 ENCOUNTER — Ambulatory Visit: Payer: Commercial Managed Care - HMO | Admitting: Physical Therapy

## 2021-09-30 DIAGNOSIS — R262 Difficulty in walking, not elsewhere classified: Secondary | ICD-10-CM

## 2021-09-30 DIAGNOSIS — M6281 Muscle weakness (generalized): Secondary | ICD-10-CM | POA: Diagnosis not present

## 2021-09-30 DIAGNOSIS — M5432 Sciatica, left side: Secondary | ICD-10-CM | POA: Diagnosis not present

## 2021-09-30 DIAGNOSIS — M5431 Sciatica, right side: Secondary | ICD-10-CM | POA: Diagnosis not present

## 2021-09-30 NOTE — Therapy (Signed)
OUTPATIENT PHYSICAL THERAPY THORACOLUMBAR TREATMENT/Re-cert    Patient Name: Leah Powers MRN: [id number] DOB:1960-12-29, 61 y.o., female Today's Date: 09/30/2021   PT End of Session - 10/02/21 1349     Visit Number 2    Number of Visits 12    Date for PT Re-Evaluation 11/25/21    Authorization Type cigna,  recert done at visit 2 ;    PT Start Time 0933    PT Stop Time 1015    PT Time Calculation (min) 42 min    Activity Tolerance Patient tolerated treatment well    Behavior During Therapy [facility] for tasks assessed/performed             Past Medical History:  Diagnosis Date   Allergy    Arthritis    L hand, R ankle  (10/08/2016)   Bipolar 1 disorder ([place])    Depression    Diabetes mellitus without complication ([place])    Fibromyalgia    told that the breakout on the upper back , could be over active nerves , also treated with cream & Gabapentin- [facility] [place]    History of kidney stones    spontaneous passing   Hypertension    Hypothyroid    Pneumonia ~ 1967; 07/2016   Pyelonephritis 1997   treated with IV antibiotic    Past Surgical History:  Procedure Laterality Date   BLADDER INSTILLATION  X 2   CHALAZION EXCISION Bilateral    R- eye X2 , L eye- X1   FOOT ARTHRODESIS Right 10/08/2016   Procedure: RIGHT TRIPLE ARTHRODESIS;  Surgeon: Newt Minion, MD;  Location: [place];  Service: Orthopedics;  Laterality: Right;   FOOT ARTHRODESIS, TRIPLE Right 10/08/2016   ankle   FRENULECTOMY, LINGUAL  1970s   LAPAROSCOPIC CHOLECYSTECTOMY  1996   PALATE SURGERY  1970s   tissue removed from upper palate as a child    URETHRAL DILATION     Patient Active Problem List   Diagnosis Date Noted   Nausea vomiting and diarrhea 05/20/2018   AKI (acute kidney injury) ([place]) 05/20/2018   Lithium toxicity 05/20/2018   Type 2 diabetes mellitus without complication, without long-term current use of insulin ([place]) 05/20/2018   Bipolar I disorder ([place]) 02/04/2018   S/P  ankle fusion 11/12/2016   Posterior tibial tendon dysfunction (PTTD) of right lower extremity 10/08/2016   HTN (hypertension) 05/31/2014   Hypothyroidism 05/31/2014    PCP: Tawnya Crook, MD   REFERRING PROVIDER: Tawnya Crook, MD   REFERRING DIAG: [phone] (ICD-10-CM) - Bilateral sciatica  Rationale for Evaluation and Treatment Rehabilitation  THERAPY DIAG:  Difficulty in walking, not elsewhere classified  Muscle weakness (generalized)  Sciatica, left side  Sciatica, right side  ONSET DATE: chronic, right leg over the last month  SUBJECTIVE:  SUBJECTIVE STATEMENT: Still has pain, more on R>L  worse in AM. Pain on L is a bit better. As she moves around more, pain improves. Pain with getting up after sitting some.  Later in day, there is pain in R ankle,  is wearing compression sock. Had toe infection, was unable to be seen for a few weeks .   States she had a fall back in 50' and she has always had issues in the left hip. States it has intensified more since more as of late. States that the weather makes her feels terrible. She has been on prednisone and it helped a little bit. Reports that the pain has increased and is now going down her right leg and has gotten worse over the last month. States she has a 4 prong cane that she uses when she is in a lot of pain. She also has pain in her feet as she has corns and is waiting to have surgery to help with that. States that she has used her massager with minimal relief. Wants to learn what she can do for the pain.  PERTINENT HISTORY:  hx r ankle surgery, fibromyalgia, DB, bipolar, depression  PAIN:  Are you having pain? Yes: NPRS scale: 6/10 Pain location: left leg Pain description: dull achy Aggravating factors: sit to stand, standing,  laying left side Relieving factors: rest   PRECAUTIONS: None  WEIGHT BEARING RESTRICTIONS No  FALLS:  Has patient fallen in last 6 months? No  LIVING ENVIRONMENT: Lives with: lives with their family Has following equipment at home: quad cane  OCCUPATION: not currently working  PLOF: Independent   PATIENT GOALS learn ways to reduce pressure and pain in back. Improve static postures/posiitoning   OBJECTIVE:   DIAGNOSTIC FINDINGS:  No recent images   SCREENING FOR RED FLAGS: Bowel or bladder incontinence: No Spinal tumors: No Cauda equina syndrome: No Compression fracture: No Abdominal aneurysm: No  COGNITION:  Overall cognitive status: Within functional limits for tasks assessed        POSTURE/Swelling:  hyper extends bilateral knees, increased B swelling - wearing compression stocking, forward head  PALPATION:  tenderness in legs due to swelling R>L, increased tension in lumbar paraspinals -   LUMBAR ROM:   Active  A/PROM  eval  Flexion 50% limited  Extension 100% limited  Right lateral flexion 25% limited  Left lateral flexion 75% limited and painful  Right rotation   Left rotation    (Blank rows = not tested)     LE Measurements Lower Extremity Right 09/30/2021 Left 09/30/2021   A/PROM MMT A/PROM MMT  Hip Flexion WFL 4 WFL 4  Hip Extension      Hip Abduction  4-  3+*  Hip Adduction      Hip Internal rotation 30*  50   Hip External rotation 65  65   Knee Flexion WFL 4  4  Knee Extension Hyperextends  4+ Hyperextends  4+  Ankle Dorsiflexion  4*  4*  Ankle Plantarflexion      Ankle Inversion      Ankle Eversion       (Blank rows = not tested)  * pain  Used towel for MMT   LUMBAR SPECIAL TESTS:  SLR neg B, slump test neg B   FUNCTIONAL TESTS:  bed rolling painful and slow, STS uses arms slow and painful  motion     TODAY'S TREATMENT  09/30/2021 Therapeutic Exercise:  Aerobic:  Supine: TA 5 sec x 10; Supine  march x 15;   Prone:    Seated: Sit to stand 2x 5 with education on mechanics.   Standing: Stretches: pelvic tilts x 10,  LTR x 10, 5 sec, piriformis 30 sec x 2 bil; SKTC 30 sec x 2 bil;  Neuromuscular Re-education: Manual Therapy:     PATIENT EDUCATION:  Education details: updated and reviewed HEP Person educated: Patient Education method: Explanation, Demonstration, and Handouts Education comprehension: verbalized understanding    HOME EXERCISE PROGRAM: [id number]  ASSESSMENT:  CLINICAL IMPRESSION: Re-Cert: Pt has been seen for 2 visits. Re-cert today, pt was unable to attend PT for a while due to toe infection. Pt will require continued care. She has made minimal improvements since Eval, due to being unable to attend. She continues to have much weakness in core and hip complex, with decreased stability in standing. She has increased pain in low back area that is effecting mobility. She also has increased pronation in feet, and will benefit from footwear update and likely custom orthotic. Pt to benefit from skilled PT to improve deficits and pain.    OBJECTIVE IMPAIRMENTS decreased activity tolerance, decreased mobility, difficulty walking, decreased ROM, decreased strength, increased edema, improper body mechanics, postural dysfunction, and pain.   ACTIVITY LIMITATIONS sitting, standing, squatting, transfers, and bed mobility  PARTICIPATION LIMITATIONS: meal prep, cleaning, shopping, and community activity  PERSONAL FACTORS Age, Fitness, and 1-2 comorbidities: B corn pain, R ankle surgery history    REHAB POTENTIAL: Good  CLINICAL DECISION MAKING: Stable/uncomplicated  EVALUATION COMPLEXITY: Low   GOALS: Goals reviewed with patient?  yes  SHORT TERM GOALS:  Patient will be independent in self management strategies to improve quality of life and functional outcomes. Baseline: new program Target date: 10/14/2021 Goal status: INITIAL  2.  Patient will report at least 50% improvement in  overall symptoms and/or function to demonstrate improved functional mobility Baseline: 0% Target date:10/14/2021 Goal status: INITIAL  3.  Patient will be able to perform pain free bed mobilities Baseline: painful Target date: 10/14/21 Goal status: INITIAL      LONG TERM GOALS:  Patient will report at least 75% improvement in overall symptoms and/or function to demonstrate improved functional mobility Baseline: 0% Target date: 9/11//2023 Goal status: INITIAL  2.  Patient will be able to report managing right leg pain with HEP. Baseline: new program Target date: 11/25/2021 Goal status: INITIAL  3.  Patient will be able to demonstrate painfree lumbar ROM Baseline: painful Target date: 11/25/2021 Goal status: INITIAL  4. Pt to demo improved hip strength to at least 4/5 to improve stability, pain, and gait.  Target date: 11/25/2021 Goal status: INITIAL/added 09/30/21       PLAN: PT FREQUENCY: 2x/week  PT DURATION: 8 weeks  PLANNED INTERVENTIONS: Therapeutic exercises, Therapeutic activity, Neuromuscular re-education, Balance training, Gait training, Patient/Family education, Joint mobilization, Aquatic Therapy, Dry Needling, Electrical stimulation, Spinal mobilization, Cryotherapy, Moist heat, Ultrasound, Ionotophoresis '4mg'$ /ml Dexamethasone, and Manual therapy.  PLAN FOR NEXT SESSION: hip/back ROM, isometrics, LE /core strengthening  Lyndee Hensen, PT, DPT 1:57 PM  10/02/21

## 2021-10-01 ENCOUNTER — Encounter: Payer: Self-pay | Admitting: Family Medicine

## 2021-10-02 ENCOUNTER — Encounter: Payer: Self-pay | Admitting: Physical Therapy

## 2021-10-03 ENCOUNTER — Other Ambulatory Visit (HOSPITAL_COMMUNITY)
Admission: RE | Admit: 2021-10-03 | Discharge: 2021-10-03 | Disposition: A | Discharge status: Home / Self Care | Payer: Managed Care, Other (non HMO) | Source: Ambulatory Visit | Attending: Family Medicine | Admitting: Family Medicine

## 2021-10-03 ENCOUNTER — Encounter: Payer: Self-pay | Admitting: Family Medicine

## 2021-10-03 ENCOUNTER — Encounter: Payer: Self-pay | Admitting: Physical Therapy

## 2021-10-03 ENCOUNTER — Ambulatory Visit: Payer: Commercial Managed Care - HMO | Admitting: Physical Therapy

## 2021-10-03 ENCOUNTER — Ambulatory Visit (INDEPENDENT_AMBULATORY_CARE_PROVIDER_SITE_OTHER): Payer: Commercial Managed Care - HMO | Admitting: Family Medicine

## 2021-10-03 VITALS — BP 124/76 | HR 71 | Temp 97.5°F | Ht 67.5 in | Wt 246.1 lb

## 2021-10-03 DIAGNOSIS — Z124 Encounter for screening for malignant neoplasm of cervix: Secondary | ICD-10-CM | POA: Insufficient documentation

## 2021-10-03 DIAGNOSIS — M6281 Muscle weakness (generalized): Secondary | ICD-10-CM | POA: Diagnosis not present

## 2021-10-03 DIAGNOSIS — R262 Difficulty in walking, not elsewhere classified: Secondary | ICD-10-CM

## 2021-10-03 DIAGNOSIS — M5432 Sciatica, left side: Secondary | ICD-10-CM

## 2021-10-03 DIAGNOSIS — Z0000 Encounter for general adult medical examination without abnormal findings: Secondary | ICD-10-CM

## 2021-10-03 DIAGNOSIS — M5431 Sciatica, right side: Secondary | ICD-10-CM | POA: Diagnosis not present

## 2021-10-03 LAB — HM PAP SMEAR: HM Pap smear: NORMAL

## 2021-10-03 LAB — RESULTS CONSOLE HPV: CHL HPV: NEGATIVE

## 2021-10-03 NOTE — Patient Instructions (Signed)
It was very nice to see you today!  Will see you in Nov   PLEASE NOTE:  If you had any lab tests please let us know if you have not heard back within a few days. You may see your results on MyChart before we have a chance to review them but we will give you a call once they are reviewed by [place]. If we ordered any referrals today, please let us know if you have not heard from their office within the next week.   Please try these tips to maintain a healthy lifestyle:  Eat most of your calories during the day when you are active. Eliminate processed foods including packaged sweets (pies, cakes, cookies), reduce intake of potatoes, white bread, white pasta, and white rice. Look for whole grain options, oat flour or almond flour.  Each meal should contain half fruits/vegetables, one quarter protein, and one quarter carbs (no bigger than a computer mouse).  Cut down on sweet beverages. This includes juice, soda, and sweet tea. Also watch fruit intake, though this is a healthier sweet option, it still contains natural sugar! Limit to 3 servings daily.  Drink at least 1 glass of water with each meal and aim for at least 8 glasses per day  Exercise at least 150 minutes every week.

## 2021-10-03 NOTE — Progress Notes (Signed)
Phone [phone]   Subjective:   Patient is a 61 y.o. female presenting for annual physical.    Chief Complaint  Patient presents with   Annual Exam    Not fasting    CPX-pod did ABI's- finally got records to [employer] to sch.   Pap due in Nov.    See problem oriented charting- ROS- ROS: Gen: no fever, chills  Skin: no rash, itching ENT: no ear pain, ear drainage, nasal congestion, rhinorrhea, sinus pressure, sore throat Eyes: no blurry vision, double vision Resp: no cough, wheeze,SOB CV: no CP, palpitations, LE edema,  GI: no heartburn, n/v/d/c, abd pain GU: no dysuria, urgency, frequency, hematuria.   G0.  Menopause about 61yo.  No bleeding.  MSK: occ pain L  ribs intermitt.   Neuro: no dizziness, headache, weakness, vertigo Psych: MH Neurodermatitis flaring.   The following were reviewed and entered/updated in epic: Past Medical History:  Diagnosis Date   Allergy    Arthritis    L hand, R ankle  (10/08/2016)   Bipolar 1 disorder ([facility])    Depression    Diabetes mellitus without complication ([place])    Fibromyalgia    told that the breakout on the upper back , could be over active nerves , also treated with cream & Gabapentin- [facility] [place]    History of kidney stones    spontaneous passing   Hypertension    Hypothyroid    Pneumonia ~ 1967; 07/2016   Pyelonephritis 1997   treated with IV antibiotic    Patient Active Problem List   Diagnosis Date Noted   Nausea vomiting and diarrhea 05/20/2018   AKI (acute kidney injury) ([place]) 05/20/2018   Lithium toxicity 05/20/2018   Type 2 diabetes mellitus without complication, without long-term current use of insulin ([place]) 05/20/2018   Bipolar I disorder ([place]) 02/04/2018   S/P ankle fusion 11/12/2016   Posterior tibial tendon dysfunction (PTTD) of right lower extremity 10/08/2016   HTN (hypertension) 05/31/2014   Hypothyroidism 05/31/2014   Past Surgical History:  Procedure Laterality Date    BLADDER INSTILLATION  X 2   CHALAZION EXCISION Bilateral    R- eye X2 , L eye- X1   FOOT ARTHRODESIS Right 10/08/2016   Procedure: RIGHT TRIPLE ARTHRODESIS;  Surgeon: Newt Minion, MD;  Location: [facility];  Service: Orthopedics;  Laterality: Right;   FOOT ARTHRODESIS, TRIPLE Right 10/08/2016   ankle   FRENULECTOMY, LINGUAL  1970s   LAPAROSCOPIC CHOLECYSTECTOMY  1996   PALATE SURGERY  1970s   tissue removed from upper palate as a child    URETHRAL DILATION      Family History  Problem Relation Age of Onset   Sleep apnea Father    Hypertension Father    Diabetes Sister    Hypertension Sister    Asthma Sister    Breast cancer Maternal Grandmother 33   Cancer Paternal Grandfather    Breast cancer Maternal Aunt 60    Medications- reviewed and updated Current Outpatient Medications  Medication Sig Dispense Refill   albuterol (VENTOLIN HFA) 108 (90 Base) MCG/ACT inhaler Inhale 2 puffs into the lungs every 6 (six) hours as needed for wheezing or shortness of breath. 1 each 2   B Complex Vitamins (B-COMPLEX/B-12) TABS      cetirizine (ZYRTEC) 5 MG tablet Take 5 mg by mouth 2 (two) times daily as needed for allergies.      Dulaglutide (TRULICITY) 8.54 OE/7.0JJ SOPN Inject 0.75 mg into the skin  once a week. 6 mL 1   Elastic Bandages & Supports (MEDICAL COMPRESSION SOCKS) MISC Wear compression stockings daily with upright activity. Remove at bedtime. 2 each 0   FARXIGA 5 MG TABS tablet Take 1 tablet by mouth once daily 90 tablet 1   furosemide (LASIX) 20 MG tablet Take 1 tablet (20 mg total) by mouth daily. 90 tablet 3   gabapentin (NEURONTIN) 300 MG capsule Take as directed ('300mg'$  5 times a day) 450 capsule 4   Lancets (ONETOUCH DELICA PLUS [id number]) MISC Inject 1 Stick into the skin daily. 100 each 2   levothyroxine (SYNTHROID) 125 MCG tablet TAKE ONE TABLET BY MOUTH DAILY BEFORE BREAKFAST 90 tablet 1   lithium carbonate 300 MG capsule Take 3 capsules (900 mg total) by mouth at  bedtime. 270 capsule 0   losartan (COZAAR) 50 MG tablet Take 1 tablet (50 mg total) by mouth daily. 90 tablet 3   mupirocin ointment (BACTROBAN) 2 % Apply topical to aa qd 22 g 3   naproxen sodium (ALEVE) 220 MG tablet Take 220 mg by mouth daily as needed (pain).     ONETOUCH VERIO test strip 1 each by Other route daily. 100 each 3   rosuvastatin (CRESTOR) 20 MG tablet Take 1 tablet (20 mg total) by mouth daily. 90 tablet 1   No current facility-administered medications for this visit.    Allergies-reviewed and updated Allergies  Allergen Reactions   Eucalyptus Oil Shortness Of Breath    Other reaction(s): flush, cant breath   Haldol [Haloperidol Decanoate] Other (See Comments)    Drooling, weaving in floor, parkinson's syndrome (couldn't eat or talk) , pt was hospitalized    Molds & Smuts Shortness Of Breath and Itching   Other Anaphylaxis, Shortness Of Breath, Itching and Swelling    Bleu cheese    Penicillins Anaphylaxis and Rash    Pt was 61 years old  Has patient had a PCN reaction causing immediate rash, facial/tongue/throat swelling, SOB or lightheadedness with hypotension: Yes Has patient had a PCN reaction causing severe rash involving mucus membranes or skin necrosis: Unknown Has patient had a PCN reaction that required hospitalization: No Has patient had a PCN reaction occurring within the last 10 years: No If all of the above answers are "NO", then may proceed with Cephalosporin use.    Bee Venom Other (See Comments)    Yellow jackets and wasps-stung by 37, MD told pt that she wouldn't have resistance if stung again    Corn Oil     Other reaction(s): diarrhea, lots   Corn-Containing Products Nausea And Vomiting and Other (See Comments)    Stomach distress    Demeclocycline Other (See Comments)   Diflunisal Other (See Comments)    Upset stomach Other reaction(s): Unknown   Haloperidol Lactate     Other reaction(s): stumble around, droul   Hydrochlorothiazide      Other reaction(s): elevated lithium level   Hydrocodone-Acetaminophen     Other reaction(s): abd pain   Imipramine     Other reaction(s): rash   Metformin     Other reaction(s): stomach upset   Metformin And Related    Nitrofurantoin     Other reaction(s): rash   Penicillin G     Other reaction(s): rash   Sulfamethoxazole-Trimethoprim     Other reaction(s): rash   Tetracycline Hcl     Other reaction(s): rash   Vicodin [Hydrocodone-Acetaminophen] Diarrhea and Other (See Comments)    Upset stomach   Cephalexin Rash  Other reaction(s): diarrhea   Doxycycline Rash    Other reaction(s): rash   Erythromycin Rash    Other reaction(s): rash   Macrodantin Rash   Motrin [Ibuprofen] Palpitations   Septra [Bactrim] Rash   Tetracyclines & Related Rash   Tofranil-Pm Rash    Social History   Social History Narrative   Married, no kids, enjoys her cats.   Objective  Objective:  BP 124/76   Pulse 71   Temp (!) 97.5 F (36.4 C) (Temporal)   Ht 5' 7.5" (1.715 m)   Wt 246 lb 2 oz (111.6 kg)   LMP 07/30/2013   SpO2 94%   BMI 37.98 kg/m  Physical Exam Constitutional:      Appearance: Normal appearance.  HENT:     Head: Normocephalic and atraumatic.     Right Ear: Tympanic membrane, ear canal and external ear normal.     Left Ear: Tympanic membrane, ear canal and external ear normal.     Nose: Nose normal.     Mouth/Throat:     Mouth: Mucous membranes are moist.     Pharynx: Oropharynx is clear.  Eyes:     General: No scleral icterus.    Extraocular Movements: Extraocular movements intact.     Conjunctiva/sclera: Conjunctivae normal.     Pupils: Pupils are equal, round, and reactive to light.  Neck:     Vascular: No carotid bruit.  Cardiovascular:     Rate and Rhythm: Normal rate and regular rhythm.     Pulses: Normal pulses.     Heart sounds: Normal heart sounds. No murmur heard. Pulmonary:     Effort: Pulmonary effort is normal.     Breath sounds: Normal breath  sounds.  Abdominal:     General: Bowel sounds are normal. There is no distension.     Palpations: Abdomen is soft. There is no mass.     Tenderness: There is no abdominal tenderness.  Musculoskeletal:        General: Tenderness present.     Cervical back: Neck supple.     Right lower leg: No edema.     Left lower leg: No edema.  Lymphadenopathy:     Cervical: No cervical adenopathy.  Neurological:     General: No focal deficit present.     Mental Status: She is alert and oriented to person, place, and time.  Psychiatric:        Mood and Affect: Mood normal.    Breasts: Examined lying and sitting. - chaperone QJ present             Right:   Without masses, retractions,  nipple discharge or axillary adenopathy.               Left:     Without masses, retractions, nipple discharge or axillary adenopathy. Genitourinary - odor-fishy             Inguinal/mons:  Normal without inguinal adenopathy.  Skin tag/mole L mons             External genitalia:  Normal appearing vulva with no masses, tenderness, or lesions             BUS/Urethra/Skene's glands:  Normal             Vagina:  Normal appearing with normal color and discharge, no lesions             Cervix:  Normal appearing without discharge or lesions  Uterus:  Normal in size, shape and contour.  Midline and mobile, nontender             Adnexa/parametria:                           Rt:        Normal in size, without masses or tenderness.                         Lt:        Normal in size, without masses or tenderness.             Anus and perineum: Normal     Assessment and Plan   Health Maintenance counseling: 1. Anticipatory guidance: Patient counseled regarding regular dental exams q6 months, eye exams,  avoiding smoking and second hand smoke, limiting alcohol to 1 beverage per day, no illicit drugs.   2. Risk factor reduction:  Advised patient of need for regular exercise and diet rich and fruits and vegetables to  reduce risk of heart attack and stroke. Exercise- some.  Wt Readings from Last 3 Encounters:  10/03/21 246 lb 2 oz (111.6 kg)  08/19/21 248 lb 2 oz (112.5 kg)  08/18/21 230 lb (104.3 kg)   3. Immunizations/screenings/ancillary studies Immunization History  Administered Date(s) Administered   Influenza Split 12/01/2017, 12/11/2019, 12/14/2020   Influenza,inj,Quad PF,6+ Mos 11/30/2015, 11/30/2015, 12/01/2017, 12/15/2018   Influenza,trivalent, recombinat, inj, PF 11/27/2012, 01/19/2013   Influenza-Unspecified 11/27/2012, 01/19/2013, 12/23/2013, 12/22/2014, 11/30/2015, 12/08/2016, 12/28/2016, 12/15/2018   PFIZER(Purple Top)SARS-COV-2 Vaccination 06/14/2019, 07/05/2019, 01/04/2020, 09/14/2020   Pfizer Covid-19 Vaccine Bivalent Booster 68yr & up 12/27/2020   Pneumococcal Conjugate-13 11/24/2018   Td 09/03/2007, 01/05/2019   Tdap 09/03/2007, 08/11/2017, 01/05/2019   Zoster Recombinat (Shingrix) 05/15/2021   Health Maintenance Due  Topic Date Due   COLONOSCOPY (Pts 45-452yrInsurance coverage will need to be confirmed)  Never done   PAP SMEAR-Modifier  01/20/2020    4. Cervical cancer screening- done 5. Breast cancer screening-  mammogram utd 6. Colon cancer screening - in process 7. Skin cancer screening- advised regular sunscreen use. Denies worrisome, changing, or new skin lesions.  8. Birth control/STD check- n/a 9. Osteoporosis screening- n/a 10. Smoking associated screening - non smoker  Problem List Items Addressed This Visit   None Visit Diagnoses     Wellness examination    -  Primary   Screening for malignant neoplasm of cervix       Relevant Orders   Cytology - PAP( [place])      Wellness-antic guidance.  Labs utd.  Colon in process.  Check pap w/HPV.  Work on [employer]/u as sch in nov  Recommended follow up: as sch in Nov. Return for as sch in Nov. Future Appointments  Date Time Provider [place]7/24/2023 10:30 AM [facility] MM 2 [facility]   10/07/2021  4:00 PM CaLyndee HensenPT [facility] None  10/08/2021 10:00 AM HuDonnal Moat, PA-C [facility] None  10/10/2021  1:00 PM CaLyndee HensenPT [facility] None  10/14/2021 11:00 AM CaLyndee HensenPT [facility] None  10/16/2021 11:00 AM CaLyndee HensenPT [facility] None  10/22/2021 10:15 AM CaLyndee HensenPT [facility] None  10/24/2021 10:15 AM CaLyndee HensenPT [facility] None  10/28/2021 10:15 AM CaLyndee HensenPT [facility] None  10/30/2021 10:15 AM CaLyndee HensenPT [facility] None  11/05/2021 10:15 AM CaLyndee HensenPT [facility] None  11/07/2021 10:15  AM Lyndee Hensen, PT [facility] None  11/26/2021 10:00 AM Warren Danes, PA-C [facility]  01/31/2022 10:00 AM Tawnya Crook, MD [facility]    Lab/Order associations:n/a fasting   ICD-10-CM   1. Wellness examination  Z00.00     2. Screening for malignant neoplasm of cervix  Z12.4 Cytology - PAP( [place])      No orders of the defined types were placed in this encounter.   Wellington Hampshire, MD

## 2021-10-03 NOTE — Therapy (Signed)
OUTPATIENT PHYSICAL THERAPY TREATMENT   Patient Name: ANNALYNN CENTANNI MRN: [id number] DOB:11/01/1960, 61 y.o., female Today's Date: 10/03/2021   PT End of Session - 10/03/21 0932     Visit Number 3    Number of Visits 20    Date for PT Re-Evaluation 11/25/21    Authorization Type cigna,  recert done at visit 2 ;    PT Start Time 0933    PT Stop Time 1015    PT Time Calculation (min) 42 min    Activity Tolerance Patient tolerated treatment well    Behavior During Therapy [facility] for tasks assessed/performed             Past Medical History:  Diagnosis Date   Allergy    Arthritis    L hand, R ankle  (10/08/2016)   Bipolar 1 disorder ([place])    Depression    Diabetes mellitus without complication ([place])    Fibromyalgia    told that the breakout on the upper back , could be over active nerves , also treated with cream & Gabapentin- [facility] [place]    History of kidney stones    spontaneous passing   Hypertension    Hypothyroid    Pneumonia ~ 1967; 07/2016   Pyelonephritis 1997   treated with IV antibiotic    Past Surgical History:  Procedure Laterality Date   BLADDER INSTILLATION  X 2   CHALAZION EXCISION Bilateral    R- eye X2 , L eye- X1   FOOT ARTHRODESIS Right 10/08/2016   Procedure: RIGHT TRIPLE ARTHRODESIS;  Surgeon: Newt Minion, MD;  Location: [place];  Service: Orthopedics;  Laterality: Right;   FOOT ARTHRODESIS, TRIPLE Right 10/08/2016   ankle   FRENULECTOMY, LINGUAL  1970s   LAPAROSCOPIC CHOLECYSTECTOMY  1996   PALATE SURGERY  1970s   tissue removed from upper palate as a child    URETHRAL DILATION     Patient Active Problem List   Diagnosis Date Noted   Nausea vomiting and diarrhea 05/20/2018   AKI (acute kidney injury) ([place]) 05/20/2018   Lithium toxicity 05/20/2018   Type 2 diabetes mellitus without complication, without long-term current use of insulin ([place]) 05/20/2018   Bipolar I disorder ([place]) 02/04/2018   S/P ankle fusion 11/12/2016    Posterior tibial tendon dysfunction (PTTD) of right lower extremity 10/08/2016   HTN (hypertension) 05/31/2014   Hypothyroidism 05/31/2014    PCP: Tawnya Crook, MD   REFERRING PROVIDER: Tawnya Crook, MD   REFERRING DIAG: [phone] (ICD-10-CM) - Bilateral sciatica  Rationale for Evaluation and Treatment Rehabilitation  THERAPY DIAG:  Difficulty in walking, not elsewhere classified  Muscle weakness (generalized)  Sciatica, left side  Sciatica, right side  ONSET DATE: chronic, right leg over the last month  SUBJECTIVE:  SUBJECTIVE STATEMENT: Pt has been doing HEP. Back doing better overall, but Still having pain in R SI, glute. Legs were tired after session and doing HEP.    States she had a fall back in 35' and she has always had issues in the left hip. States it has intensified more since more as of late. States that the weather makes her feels terrible. She has been on prednisone and it helped a little bit. Reports that the pain has increased and is now going down her right leg and has gotten worse over the last month. States she has a 4 prong cane that she uses when she is in a lot of pain. She also has pain in her feet as she has corns and is waiting to have surgery to help with that. States that she has used her massager with minimal relief. Wants to learn what she can do for the pain.  PERTINENT HISTORY:  hx r ankle surgery, fibromyalgia, DB, bipolar, depression  PAIN:  Are you having pain? Yes: NPRS scale: 6/10 Pain location: left leg Pain description: dull achy Aggravating factors: sit to stand, standing, laying left side Relieving factors: rest   PRECAUTIONS: None  WEIGHT BEARING RESTRICTIONS No  FALLS:  Has patient fallen in last 6 months? No  LIVING  ENVIRONMENT: Lives with: lives with their family Has following equipment at home: quad cane  OCCUPATION: not currently working  PLOF: Independent   PATIENT GOALS learn ways to reduce pressure and pain in back. Improve static postures/posiitoning   OBJECTIVE:   DIAGNOSTIC FINDINGS:  No recent images   SCREENING FOR RED FLAGS: Bowel or bladder incontinence: No Spinal tumors: No Cauda equina syndrome: No Compression fracture: No Abdominal aneurysm: No  COGNITION:  Overall cognitive status: Within functional limits for tasks assessed        POSTURE/Swelling:  hyper extends bilateral knees, increased B swelling - wearing compression stocking, forward head  PALPATION:  tenderness in legs due to swelling R>L, increased tension in lumbar paraspinals -   LUMBAR ROM:   Active  A/PROM  eval  Flexion 50% limited  Extension 100% limited  Right lateral flexion 25% limited  Left lateral flexion 75% limited and painful  Right rotation   Left rotation    (Blank rows = not tested)     LE Measurements Lower Extremity Right 09/30/2021 Left 09/30/2021   A/PROM MMT A/PROM MMT  Hip Flexion WFL 4 WFL 4  Hip Extension      Hip Abduction  4-  3+*  Hip Adduction      Hip Internal rotation 30*  50   Hip External rotation 65  65   Knee Flexion WFL 4  4  Knee Extension Hyperextends  4+ Hyperextends  4+  Ankle Dorsiflexion  4*  4*  Ankle Plantarflexion      Ankle Inversion      Ankle Eversion       (Blank rows = not tested)  * pain  Used towel for MMT   LUMBAR SPECIAL TESTS:  SLR neg B, slump test neg B   FUNCTIONAL TESTS:  bed rolling painful and slow, STS uses arms slow and painful  motion     TODAY'S TREATMENT   10/03/21: Therapeutic Exercise:  Aerobic:  Supine: TA 5 sec x 10 with education and cuing to achieving contraction;    Prone:   Seated: Sit to stand 2 x 5, Heel raises x 20 with education on mechanics.   Standing: Marching x  20, 3 x 5 bil hip abd ; HR  no shoes x 10;  Stretches: pelvic tilts x 10,  LTR x 5 (pain) , supine mod piriformis 30 sec x 2 bil;   SKTC 30 sec x 3 bil;  Neuromuscular Re-education: Manual Therapy:    09/30/2021 Therapeutic Exercise:  Aerobic:  Supine: TA 5 sec x 10; Supine march x 15;   Prone:   Seated: Sit to stand 2x 5 with education on mechanics.   Standing: Stretches: pelvic tilts x 10,  LTR x 10, 5 sec, piriformis 30 sec x 2 bil; SKTC 30 sec x 2 bil;  Neuromuscular Re-education: Manual Therapy:     PATIENT EDUCATION:  Education details: updated and reviewed HEP Person educated: Patient Education method: Explanation, Demonstration, and Handouts Education comprehension: verbalized understanding    HOME EXERCISE PROGRAM: [id number]  ASSESSMENT:  CLINICAL IMPRESSION: Pt with good tolerance for ther ex today, able to do light strengthening for LEs. Significant weakness in feet/ankles noted, she is able to do Heel raise in sitting, but challenging due to weakness. Plan to progress as tolerated.   OBJECTIVE IMPAIRMENTS decreased activity tolerance, decreased mobility, difficulty walking, decreased ROM, decreased strength, increased edema, improper body mechanics, postural dysfunction, and pain.   ACTIVITY LIMITATIONS sitting, standing, squatting, transfers, and bed mobility  PARTICIPATION LIMITATIONS: meal prep, cleaning, shopping, and community activity  PERSONAL FACTORS Age, Fitness, and 1-2 comorbidities: B corn pain, R ankle surgery history    REHAB POTENTIAL: Good  CLINICAL DECISION MAKING: Stable/uncomplicated  EVALUATION COMPLEXITY: Low   GOALS: Goals reviewed with patient?  yes  SHORT TERM GOALS:  Patient will be independent in self management strategies to improve quality of life and functional outcomes. Baseline: new program Target date: 10/14/2021 Goal status: INITIAL  2.  Patient will report at least 50% improvement in overall symptoms and/or function to demonstrate improved  functional mobility Baseline: 0% Target date:10/14/2021 Goal status: INITIAL  3.  Patient will be able to perform pain free bed mobilities Baseline: painful Target date: 10/14/21 Goal status: INITIAL      LONG TERM GOALS:  Patient will report at least 75% improvement in overall symptoms and/or function to demonstrate improved functional mobility Baseline: 0% Target date: 9/11//2023 Goal status: INITIAL  2.  Patient will be able to report managing right leg pain with HEP. Baseline: new program Target date: 11/25/2021 Goal status: INITIAL  3.  Patient will be able to demonstrate painfree lumbar ROM Baseline: painful Target date: 11/25/2021 Goal status: INITIAL  4. Pt to demo improved hip strength to at least 4/5 to improve stability, pain, and gait.  Target date: 11/25/2021 Goal status: INITIAL/added 09/30/21       PLAN: PT FREQUENCY: 2x/week  PT DURATION: 8 weeks  PLANNED INTERVENTIONS: Therapeutic exercises, Therapeutic activity, Neuromuscular re-education, Balance training, Gait training, Patient/Family education, Joint mobilization, Aquatic Therapy, Dry Needling, Electrical stimulation, Spinal mobilization, Cryotherapy, Moist heat, Ultrasound, Ionotophoresis '4mg'$ /ml Dexamethasone, and Manual therapy.  PLAN FOR NEXT SESSION: hip/back ROM, isometrics, LE /core strengthening  Lyndee Hensen, PT, DPT 2:15 PM  10/03/21

## 2021-10-07 ENCOUNTER — Ambulatory Visit (INDEPENDENT_AMBULATORY_CARE_PROVIDER_SITE_OTHER): Payer: 59 | Admitting: Physical Therapy

## 2021-10-07 ENCOUNTER — Encounter: Payer: Self-pay | Admitting: Physical Therapy

## 2021-10-07 ENCOUNTER — Ambulatory Visit: Payer: Commercial Managed Care - HMO

## 2021-10-07 DIAGNOSIS — R262 Difficulty in walking, not elsewhere classified: Secondary | ICD-10-CM | POA: Diagnosis not present

## 2021-10-07 DIAGNOSIS — M5432 Sciatica, left side: Secondary | ICD-10-CM

## 2021-10-07 DIAGNOSIS — M6281 Muscle weakness (generalized): Secondary | ICD-10-CM

## 2021-10-07 DIAGNOSIS — M5431 Sciatica, right side: Secondary | ICD-10-CM

## 2021-10-07 LAB — CYTOLOGY - PAP
Comment: NEGATIVE
Diagnosis: NEGATIVE
High risk HPV: NEGATIVE

## 2021-10-07 NOTE — Therapy (Unsigned)
OUTPATIENT PHYSICAL THERAPY TREATMENT   Patient Name: Leah Powers MRN: [id number] DOB:07/29/1960, 61 y.o., female Today's Date: 10/07/2021     Past Medical History:  Diagnosis Date   Allergy    Arthritis    L hand, R ankle  (10/08/2016)   Bipolar 1 disorder ([facility])    Depression    Diabetes mellitus without complication ([place])    Fibromyalgia    told that the breakout on the upper back , could be over active nerves , also treated with cream & Gabapentin- [facility] [place]    History of kidney stones    spontaneous passing   Hypertension    Hypothyroid    Pneumonia ~ 1967; 07/2016   Pyelonephritis 1997   treated with IV antibiotic    Past Surgical History:  Procedure Laterality Date   BLADDER INSTILLATION  X 2   CHALAZION EXCISION Bilateral    R- eye X2 , L eye- X1   FOOT ARTHRODESIS Right 10/08/2016   Procedure: RIGHT TRIPLE ARTHRODESIS;  Surgeon: Newt Minion, MD;  Location: [place];  Service: Orthopedics;  Laterality: Right;   FOOT ARTHRODESIS, TRIPLE Right 10/08/2016   ankle   FRENULECTOMY, LINGUAL  1970s   LAPAROSCOPIC CHOLECYSTECTOMY  1996   PALATE SURGERY  1970s   tissue removed from upper palate as a child    URETHRAL DILATION     Patient Active Problem List   Diagnosis Date Noted   Nausea vomiting and diarrhea 05/20/2018   AKI (acute kidney injury) ([place]) 05/20/2018   Lithium toxicity 05/20/2018   Type 2 diabetes mellitus without complication, without long-term current use of insulin ([place]) 05/20/2018   Bipolar I disorder ([place]) 02/04/2018   S/P ankle fusion 11/12/2016   Posterior tibial tendon dysfunction (PTTD) of right lower extremity 10/08/2016   HTN (hypertension) 05/31/2014   Hypothyroidism 05/31/2014    PCP: Tawnya Crook, MD   REFERRING PROVIDER: Tawnya Crook, MD   REFERRING DIAG: [phone] (ICD-10-CM) - Bilateral sciatica  Rationale for Evaluation and Treatment Rehabilitation  THERAPY DIAG:  No diagnosis  found.  ONSET DATE: chronic, right leg over the last month  SUBJECTIVE:                                                                                                                                                                                           SUBJECTIVE STATEMENT: Pt with mild soreness in L side of back today. Increased pain with increased standing, walking, bending, but did do better over the weekend when she did a lot- less pain than previous weeks.    States she had a  fall back in 10' and she has always had issues in the left hip. States it has intensified more since more as of late. States that the weather makes her feels terrible. She has been on prednisone and it helped a little bit. Reports that the pain has increased and is now going down her right leg and has gotten worse over the last month. States she has a 4 prong cane that she uses when she is in a lot of pain. She also has pain in her feet as she has corns and is waiting to have surgery to help with that. States that she has used her massager with minimal relief. Wants to learn what she can do for the pain.  PERTINENT HISTORY:  hx r ankle surgery, fibromyalgia, DB, bipolar, depression  PAIN:  Are you having pain? Yes: NPRS scale: 4/10 Pain location: left leg, low back  Pain description: dull achy Aggravating factors: sit to stand, standing, laying left side Relieving factors: rest   PRECAUTIONS: None  WEIGHT BEARING RESTRICTIONS No  FALLS:  Has patient fallen in last 6 months? No  LIVING ENVIRONMENT: Lives with: lives with their family Has following equipment at home: quad cane  OCCUPATION: not currently working  PLOF: Independent   PATIENT GOALS learn ways to reduce pressure and pain in back. Improve static postures/posiitoning   OBJECTIVE:   DIAGNOSTIC FINDINGS:  No recent images   SCREENING FOR RED FLAGS: Bowel or bladder incontinence: No Spinal tumors: No Cauda equina syndrome:  No Compression fracture: No Abdominal aneurysm: No  COGNITION:  Overall cognitive status: Within functional limits for tasks assessed        POSTURE/Swelling:  hyper extends bilateral knees, increased B swelling - wearing compression stocking, forward head  PALPATION:  tenderness in legs due to swelling R>L, increased tension in lumbar paraspinals -   LUMBAR ROM:   Active  A/PROM  eval  Flexion 50% limited  Extension 100% limited  Right lateral flexion 25% limited  Left lateral flexion 75% limited and painful  Right rotation   Left rotation    (Blank rows = not tested)     LE Measurements Lower Extremity Right 09/30/2021 Left 09/30/2021   A/PROM MMT A/PROM MMT  Hip Flexion WFL 4 WFL 4  Hip Extension      Hip Abduction  4-  3+*  Hip Adduction      Hip Internal rotation 30*  50   Hip External rotation 65  65   Knee Flexion WFL 4  4  Knee Extension Hyperextends  4+ Hyperextends  4+  Ankle Dorsiflexion  4*  4*  Ankle Plantarflexion      Ankle Inversion      Ankle Eversion       (Blank rows = not tested)  * pain  Used towel for MMT   LUMBAR SPECIAL TESTS:  SLR neg B, slump test neg B   FUNCTIONAL TESTS:  bed rolling painful and slow, STS uses arms slow and painful  motion     TODAY'S TREATMENT   10/07/21: Therapeutic Exercise:  Aerobic:  Supine: TA 5 sec x 15 with education and cuing to achieving contraction;  Supine March x 10 bil; SLR with TA, x 10 bil;   Prone:   Seated: Sit to stand x10;  Heel raises x 20 with education on mechanics.   Standing: Marching x 10;  Stretches: pelvic tilts x 10,  LTR x 10 , supine mod piriformis 30 sec x 2 bil;  SKTC 30 sec x 2 bil;  Neuromuscular Re-education: Manual Therapy:   10/03/21: Therapeutic Exercise:  Aerobic:  Supine: TA 5 sec x 10 with education and cuing to achieving contraction;    Prone:   Seated: Sit to stand 2 x 5, Heel raises x 20 with education on mechanics.   Standing: Marching x 20, 3 x 5  bil hip abd ; HR no shoes x 10;  Stretches: pelvic tilts x 10,  LTR x 5 (pain) , supine mod piriformis 30 sec x 2 bil;   SKTC 30 sec x 3 bil;  Neuromuscular Re-education: Manual Therapy:    09/30/2021 Therapeutic Exercise:  Aerobic:  Supine: TA 5 sec x 10; Supine march x 15;   Prone:   Seated: Sit to stand 2x 5 with education on mechanics.   Standing: Stretches: pelvic tilts x 10,  LTR x 10, 5 sec, piriformis 30 sec x 2 bil; SKTC 30 sec x 2 bil;  Neuromuscular Re-education: Manual Therapy:     PATIENT EDUCATION:  Education details:  reviewed HEP Person educated: Patient Education method: Consulting civil engineer, Media planner, and Handouts Education comprehension: verbalized understanding    HOME EXERCISE PROGRAM: [id number]  ASSESSMENT:  CLINICAL IMPRESSION: *** Pt with good tolerance for ther ex today, able to do light strengthening for LEs. Significant weakness in feet/ankles noted, she is able to do Heel raise in sitting, but challenging due to weakness. Plan to progress as tolerated.   OBJECTIVE IMPAIRMENTS decreased activity tolerance, decreased mobility, difficulty walking, decreased ROM, decreased strength, increased edema, improper body mechanics, postural dysfunction, and pain.   ACTIVITY LIMITATIONS sitting, standing, squatting, transfers, and bed mobility  PARTICIPATION LIMITATIONS: meal prep, cleaning, shopping, and community activity  PERSONAL FACTORS Age, Fitness, and 1-2 comorbidities: B corn pain, R ankle surgery history    REHAB POTENTIAL: Good  CLINICAL DECISION MAKING: Stable/uncomplicated  EVALUATION COMPLEXITY: Low   GOALS: Goals reviewed with patient?  yes  SHORT TERM GOALS:  Patient will be independent in self management strategies to improve quality of life and functional outcomes. Baseline: new program Target date: 10/14/2021 Goal status: INITIAL  2.  Patient will report at least 50% improvement in overall symptoms and/or function to  demonstrate improved functional mobility Baseline: 0% Target date:10/14/2021 Goal status: INITIAL  3.  Patient will be able to perform pain free bed mobilities Baseline: painful Target date: 10/14/21 Goal status: INITIAL      LONG TERM GOALS:  Patient will report at least 75% improvement in overall symptoms and/or function to demonstrate improved functional mobility Baseline: 0% Target date: 9/11//2023 Goal status: INITIAL  2.  Patient will be able to report managing right leg pain with HEP. Baseline: new program Target date: 11/25/2021 Goal status: INITIAL  3.  Patient will be able to demonstrate painfree lumbar ROM Baseline: painful Target date: 11/25/2021 Goal status: INITIAL  4. Pt to demo improved hip strength to at least 4/5 to improve stability, pain, and gait.  Target date: 11/25/2021 Goal status: INITIAL/added 09/30/21       PLAN: PT FREQUENCY: 2x/week  PT DURATION: 8 weeks  PLANNED INTERVENTIONS: Therapeutic exercises, Therapeutic activity, Neuromuscular re-education, Balance training, Gait training, Patient/Family education, Joint mobilization, Aquatic Therapy, Dry Needling, Electrical stimulation, Spinal mobilization, Cryotherapy, Moist heat, Ultrasound, Ionotophoresis '4mg'$ /ml Dexamethasone, and Manual therapy.  PLAN FOR NEXT SESSION: hip/back ROM, isometrics, LE /core strengthening  Lyndee Hensen, PT, DPT 4:01 PM  10/07/21

## 2021-10-08 ENCOUNTER — Encounter: Payer: Self-pay | Admitting: Family Medicine

## 2021-10-08 ENCOUNTER — Ambulatory Visit: Payer: 59 | Admitting: Physician Assistant

## 2021-10-10 ENCOUNTER — Encounter: Payer: Self-pay | Admitting: Physical Therapy

## 2021-10-10 ENCOUNTER — Ambulatory Visit (INDEPENDENT_AMBULATORY_CARE_PROVIDER_SITE_OTHER): Payer: 59 | Admitting: Physical Therapy

## 2021-10-10 DIAGNOSIS — R262 Difficulty in walking, not elsewhere classified: Secondary | ICD-10-CM

## 2021-10-10 DIAGNOSIS — M6281 Muscle weakness (generalized): Secondary | ICD-10-CM

## 2021-10-10 DIAGNOSIS — M5432 Sciatica, left side: Secondary | ICD-10-CM

## 2021-10-10 DIAGNOSIS — M5431 Sciatica, right side: Secondary | ICD-10-CM | POA: Diagnosis not present

## 2021-10-10 NOTE — Therapy (Signed)
OUTPATIENT PHYSICAL THERAPY TREATMENT   Patient Name: Leah Powers MRN: [id number] DOB:18-Jun-1960, 61 y.o., female Today's Date: 7/272023   PT End of Session - 10/10/21 1336     Visit Number 5    Number of Visits 20    Date for PT Re-Evaluation 11/25/21    Authorization Type cigna,  recert done at visit 2 ;    PT Start Time 1302    PT Stop Time 1344    PT Time Calculation (min) 42 min    Activity Tolerance Patient tolerated treatment well    Behavior During Therapy [facility] for tasks assessed/performed               Past Medical History:  Diagnosis Date   Allergy    Arthritis    L hand, R ankle  (10/08/2016)   Bipolar 1 disorder ([place])    Depression    Diabetes mellitus without complication ([place])    Fibromyalgia    told that the breakout on the upper back , could be over active nerves , also treated with cream & Gabapentin- [facility] [place]    History of kidney stones    spontaneous passing   Hypertension    Hypothyroid    Pneumonia ~ 1967; 07/2016   Pyelonephritis 1997   treated with IV antibiotic    Past Surgical History:  Procedure Laterality Date   BLADDER INSTILLATION  X 2   CHALAZION EXCISION Bilateral    R- eye X2 , L eye- X1   FOOT ARTHRODESIS Right 10/08/2016   Procedure: RIGHT TRIPLE ARTHRODESIS;  Surgeon: Newt Minion, MD;  Location: [facility];  Service: Orthopedics;  Laterality: Right;   FOOT ARTHRODESIS, TRIPLE Right 10/08/2016   ankle   FRENULECTOMY, LINGUAL  1970s   LAPAROSCOPIC CHOLECYSTECTOMY  1996   PALATE SURGERY  1970s   tissue removed from upper palate as a child    URETHRAL DILATION     Patient Active Problem List   Diagnosis Date Noted   Nausea vomiting and diarrhea 05/20/2018   AKI (acute kidney injury) ([place]) 05/20/2018   Lithium toxicity 05/20/2018   Type 2 diabetes mellitus without complication, without long-term current use of insulin ([place]) 05/20/2018   Bipolar I disorder ([place]) 02/04/2018   S/P ankle fusion  11/12/2016   Posterior tibial tendon dysfunction (PTTD) of right lower extremity 10/08/2016   HTN (hypertension) 05/31/2014   Hypothyroidism 05/31/2014    PCP: Tawnya Crook, MD   REFERRING PROVIDER: Tawnya Crook, MD   REFERRING DIAG: [phone] (ICD-10-CM) - Bilateral sciatica  Rationale for Evaluation and Treatment Rehabilitation  THERAPY DIAG:  Difficulty in walking, not elsewhere classified  Muscle weakness (generalized)  Sciatica, left side  Sciatica, right side  ONSET DATE: chronic, right leg over the last month  SUBJECTIVE:  SUBJECTIVE STATEMENT: Pt with less pain in hip today, since last visit. She did obtain new sneakers this week, wearing with good fit and comfort.   States she had a fall back in 37' and she has always had issues in the left hip. States it has intensified more since more as of late. States that the weather makes her feels terrible. She has been on prednisone and it helped a little bit. Reports that the pain has increased and is now going down her right leg and has gotten worse over the last month. States she has a 4 prong cane that she uses when she is in a lot of pain. She also has pain in her feet as she has corns and is waiting to have surgery to help with that. States that she has used her massager with minimal relief. Wants to learn what she can do for the pain.  PERTINENT HISTORY:  hx r ankle surgery, fibromyalgia, DB, bipolar, depression  PAIN:  Are you having pain? Yes: NPRS scale: 4/10 Pain location: left leg, low back  Pain description: dull achy Aggravating factors: sit to stand, standing, laying left side Relieving factors: rest   PRECAUTIONS: None  WEIGHT BEARING RESTRICTIONS No  FALLS:  Has patient fallen in last 6 months? No  LIVING  ENVIRONMENT: Lives with: lives with their family Has following equipment at home: quad cane  OCCUPATION: not currently working  PLOF: Independent   PATIENT GOALS learn ways to reduce pressure and pain in back. Improve static postures/posiitoning   OBJECTIVE:   DIAGNOSTIC FINDINGS:  No recent images   SCREENING FOR RED FLAGS: Bowel or bladder incontinence: No Spinal tumors: No Cauda equina syndrome: No Compression fracture: No Abdominal aneurysm: No  COGNITION:  Overall cognitive status: Within functional limits for tasks assessed      POSTURE/Swelling:  hyper extends bilateral knees, increased B swelling - wearing compression stocking, forward head  PALPATION:  tenderness in legs due to swelling R>L, increased tension in lumbar paraspinals -   LUMBAR ROM:   Active  A/PROM  eval  Flexion 50% limited  Extension 100% limited  Right lateral flexion 25% limited  Left lateral flexion 75% limited and painful  Right rotation   Left rotation    (Blank rows = not tested)     LE Measurements Lower Extremity Right 09/30/2021 Left 09/30/2021   A/PROM MMT A/PROM MMT  Hip Flexion WFL 4 WFL 4  Hip Extension      Hip Abduction  4-  3+*  Hip Adduction      Hip Internal rotation 30*  50   Hip External rotation 65  65   Knee Flexion WFL 4  4  Knee Extension Hyperextends  4+ Hyperextends  4+  Ankle Dorsiflexion  4*  4*  Ankle Plantarflexion      Ankle Inversion      Ankle Eversion       (Blank rows = not tested)  * pain  Used towel for MMT   LUMBAR SPECIAL TESTS:  SLR neg B, slump test neg B   FUNCTIONAL TESTS:  bed rolling painful and slow, STS uses arms slow and painful  motion     TODAY'S TREATMENT   10/10/21: Therapeutic Exercise:  Aerobic:  Supine: TA 5 sec x 15 with education and cuing to achieving contraction;  SLR with TA, x 10 bil;  S/L hip clams x 15 bil;   Prone:   Seated: Sit to stand x10;   Standing: Marching  x 20;  Heel raises x 15;    Stretches: pelvic tilts x 10,  LTR x 10 , supine mod piriformis 30 sec x 2 bil;    SKTC 30 sec x 2 bil;  Gait: 45 ft x 8 with cuing for heel to toe, longer stride length, and arm swing. A/P weight shifts (pre-gait)  x 20 bil with education on optimal mechanics.  Neuromuscular Re-education: Manual Therapy: Dtm to L glute, gr troch.   10/07/21: Therapeutic Exercise:  Aerobic:  Supine: TA 5 sec x 15 with education and cuing to achieving contraction;  Supine March x 10 bil; SLR with TA, x 10 bil;   Prone:   Seated: Sit to stand x10;  Heel raises x 20 with education on mechanics.   Standing: Marching x 10;  Stretches: pelvic tilts x 10,  LTR x 10 , supine mod piriformis 30 sec x 2 bil;    SKTC 30 sec x 2 bil;  Neuromuscular Re-education: Manual Therapy: Dtm to L glute, piriformis, manual stretches for L hip rotation/prone,  long leg distraction on L;    10/03/21: Therapeutic Exercise:  Aerobic:  Supine: TA 5 sec x 10 with education and cuing to achieving contraction;    Prone:   Seated: Sit to stand 2 x 5, Heel raises x 20 with education on mechanics.   Standing: Marching x 20, 3 x 5 bil hip abd ; HR no shoes x 10;  Stretches: pelvic tilts x 10,  LTR x 5 (pain) , supine mod piriformis 30 sec x 2 bil;   SKTC 30 sec x 3 bil;  Neuromuscular Re-education: Manual Therapy:      PATIENT EDUCATION:  Education details:  reviewed HEP Person educated: Patient Education method: Explanation, Media planner, and Handouts Education comprehension: verbalized understanding    HOME EXERCISE PROGRAM: [id number]  ASSESSMENT:  CLINICAL IMPRESSION: Pt with improving ability for strengthening. She is able to do a standing heel raise without pain in feet. She is wearing new shoes with good fit, laces tightened at top for more support at ankle. Improved gait mechanics today after education and practice, with more anterior propulsion, an heel strike. Will benefit from continued gait training, and  strengthening. She has less tenderness in glute from last visit, still tender in gr troch.   OBJECTIVE IMPAIRMENTS decreased activity tolerance, decreased mobility, difficulty walking, decreased ROM, decreased strength, increased edema, improper body mechanics, postural dysfunction, and pain.   ACTIVITY LIMITATIONS sitting, standing, squatting, transfers, and bed mobility  PARTICIPATION LIMITATIONS: meal prep, cleaning, shopping, and community activity  PERSONAL FACTORS Age, Fitness, and 1-2 comorbidities: B corn pain, R ankle surgery history    REHAB POTENTIAL: Good  CLINICAL DECISION MAKING: Stable/uncomplicated  EVALUATION COMPLEXITY: Low   GOALS: Goals reviewed with patient?  yes  SHORT TERM GOALS:  Patient will be independent in self management strategies to improve quality of life and functional outcomes. Baseline: new program Target date: 10/14/2021 Goal status: INITIAL  2.  Patient will report at least 50% improvement in overall symptoms and/or function to demonstrate improved functional mobility Baseline: 0% Target date:10/14/2021 Goal status: INITIAL  3.  Patient will be able to perform pain free bed mobilities Baseline: painful Target date: 10/14/21 Goal status: INITIAL      LONG TERM GOALS:  Patient will report at least 75% improvement in overall symptoms and/or function to demonstrate improved functional mobility Baseline: 0% Target date: 9/11//2023 Goal status: INITIAL  2.  Patient will be able to report managing right  leg pain with HEP. Baseline: new program Target date: 11/25/2021 Goal status: INITIAL  3.  Patient will be able to demonstrate painfree lumbar ROM Baseline: painful Target date: 11/25/2021 Goal status: INITIAL  4. Pt to demo improved hip strength to at least 4/5 to improve stability, pain, and gait.  Target date: 11/25/2021 Goal status: INITIAL/added 09/30/21       PLAN: PT FREQUENCY: 2x/week  PT DURATION: 8 weeks  PLANNED  INTERVENTIONS: Therapeutic exercises, Therapeutic activity, Neuromuscular re-education, Balance training, Gait training, Patient/Family education, Joint mobilization, Aquatic Therapy, Dry Needling, Electrical stimulation, Spinal mobilization, Cryotherapy, Moist heat, Ultrasound, Ionotophoresis '4mg'$ /ml Dexamethasone, and Manual therapy.  PLAN FOR NEXT SESSION: hip/backmanul, LE, core strength, gait mechanics.   Lyndee Hensen, PT, DPT 1:36 PM  10/10/21

## 2021-10-14 ENCOUNTER — Ambulatory Visit: Payer: Commercial Managed Care - HMO | Admitting: Physical Therapy

## 2021-10-14 ENCOUNTER — Encounter: Payer: Self-pay | Admitting: Physician Assistant

## 2021-10-14 ENCOUNTER — Encounter: Payer: Self-pay | Admitting: Physical Therapy

## 2021-10-14 DIAGNOSIS — R262 Difficulty in walking, not elsewhere classified: Secondary | ICD-10-CM | POA: Diagnosis not present

## 2021-10-14 DIAGNOSIS — M6281 Muscle weakness (generalized): Secondary | ICD-10-CM | POA: Diagnosis not present

## 2021-10-14 DIAGNOSIS — M5432 Sciatica, left side: Secondary | ICD-10-CM

## 2021-10-14 DIAGNOSIS — M5431 Sciatica, right side: Secondary | ICD-10-CM | POA: Diagnosis not present

## 2021-10-14 NOTE — Therapy (Signed)
OUTPATIENT PHYSICAL THERAPY TREATMENT   Patient Name: Leah Powers MRN: [id number] DOB:Aug 24, 1960, 61 y.o., female Today's Date: 7/312023   PT End of Session - 10/14/21 1109     Visit Number 6    Number of Visits 20    Date for PT Re-Evaluation 11/25/21    Authorization Type cigna,  recert done at visit 2 ;    PT Start Time 1104    PT Stop Time 1144    PT Time Calculation (min) 40 min    Activity Tolerance Patient tolerated treatment well    Behavior During Therapy [facility] for tasks assessed/performed                Past Medical History:  Diagnosis Date   Allergy    Arthritis    L hand, R ankle  (10/08/2016)   Bipolar 1 disorder ([place])    Depression    Diabetes mellitus without complication ([place])    Fibromyalgia    told that the breakout on the upper back , could be over active nerves , also treated with cream & Gabapentin- [facility] [place]    History of kidney stones    spontaneous passing   Hypertension    Hypothyroid    Pneumonia ~ 1967; 07/2016   Pyelonephritis 1997   treated with IV antibiotic    Past Surgical History:  Procedure Laterality Date   BLADDER INSTILLATION  X 2   CHALAZION EXCISION Bilateral    R- eye X2 , L eye- X1   FOOT ARTHRODESIS Right 10/08/2016   Procedure: RIGHT TRIPLE ARTHRODESIS;  Surgeon: Newt Minion, MD;  Location: [facility];  Service: Orthopedics;  Laterality: Right;   FOOT ARTHRODESIS, TRIPLE Right 10/08/2016   ankle   FRENULECTOMY, LINGUAL  1970s   LAPAROSCOPIC CHOLECYSTECTOMY  1996   PALATE SURGERY  1970s   tissue removed from upper palate as a child    URETHRAL DILATION     Patient Active Problem List   Diagnosis Date Noted   Nausea vomiting and diarrhea 05/20/2018   AKI (acute kidney injury) ([place]) 05/20/2018   Lithium toxicity 05/20/2018   Type 2 diabetes mellitus without complication, without long-term current use of insulin ([place]) 05/20/2018   Bipolar I disorder ([place]) 02/04/2018   S/P ankle fusion  11/12/2016   Posterior tibial tendon dysfunction (PTTD) of right lower extremity 10/08/2016   HTN (hypertension) 05/31/2014   Hypothyroidism 05/31/2014    PCP: Tawnya Crook, MD   REFERRING PROVIDER: Tawnya Crook, MD   REFERRING DIAG: [phone] (ICD-10-CM) - Bilateral sciatica  Rationale for Evaluation and Treatment Rehabilitation  THERAPY DIAG:  Difficulty in walking, not elsewhere classified  Muscle weakness (generalized)  Sciatica, left side  Sciatica, right side  ONSET DATE: chronic, right leg over the last month  SUBJECTIVE:  SUBJECTIVE STATEMENT: No new complaints.   States she had a fall back in 34' and she has always had issues in the left hip. States it has intensified more since more as of late. States that the weather makes her feels terrible. She has been on prednisone and it helped a little bit. Reports that the pain has increased and is now going down her right leg and has gotten worse over the last month. States she has a 4 prong cane that she uses when she is in a lot of pain. She also has pain in her feet as she has corns and is waiting to have surgery to help with that. States that she has used her massager with minimal relief. Wants to learn what she can do for the pain.  PERTINENT HISTORY:  hx r ankle surgery, fibromyalgia, DB, bipolar, depression  PAIN:  Are you having pain? Yes: NPRS scale: 4/10 Pain location: left leg, low back  Pain description: dull achy Aggravating factors: sit to stand, standing, laying left side Relieving factors: rest   PRECAUTIONS: None  WEIGHT BEARING RESTRICTIONS No  FALLS:  Has patient fallen in last 6 months? No  LIVING ENVIRONMENT: Lives with: lives with their family Has following equipment at home: quad  cane  OCCUPATION: not currently working  PLOF: Independent   PATIENT GOALS learn ways to reduce pressure and pain in back. Improve static postures/posiitoning   OBJECTIVE:   DIAGNOSTIC FINDINGS:  No recent images   SCREENING FOR RED FLAGS: Bowel or bladder incontinence: No Spinal tumors: No Cauda equina syndrome: No Compression fracture: No Abdominal aneurysm: No  COGNITION:  Overall cognitive status: Within functional limits for tasks assessed      POSTURE/Swelling:  hyper extends bilateral knees, increased B swelling - wearing compression stocking, forward head  PALPATION:  tenderness in legs due to swelling R>L, increased tension in lumbar paraspinals -   LUMBAR ROM:   Active  A/PROM  eval  Flexion 50% limited  Extension 100% limited  Right lateral flexion 25% limited  Left lateral flexion 75% limited and painful  Right rotation   Left rotation    (Blank rows = not tested)     LE Measurements Lower Extremity Right 09/30/2021 Left 09/30/2021   A/PROM MMT A/PROM MMT  Hip Flexion WFL 4 WFL 4  Hip Extension      Hip Abduction  4-  3+*  Hip Adduction      Hip Internal rotation 30*  50   Hip External rotation 65  65   Knee Flexion WFL 4  4  Knee Extension Hyperextends  4+ Hyperextends  4+  Ankle Dorsiflexion  4*  4*  Ankle Plantarflexion      Ankle Inversion      Ankle Eversion       (Blank rows = not tested)  * pain  Used towel for MMT   LUMBAR SPECIAL TESTS:  SLR neg B, slump test neg B   FUNCTIONAL TESTS:  bed rolling painful and slow, STS uses arms slow and painful  motion     TODAY'S TREATMENT   10/14/21: Therapeutic Exercise:  Aerobic:  Supine:  SLR with TA, 2 x 10 bil; Bridging 2 x 10;   Prone:   Seated: Sit to stand x10;   Standing: Marching x 20;  Heel raises x 15; hip abd 2x10 bil;   Stretches:  LTR x 10 , seated piriformis 30 sec x 2 bil;    SKTC 30 sec x 2  bil;  Gait: 45 ft x 8 with cuing for heel to toe, longer stride  length, and arm swing. A/P weight shifts, and staggered stance weight shifts  (pre-gait)  x 20 bil with education on optimal mechanics.  Neuromuscular Re-education: Manual Therapy:       PATIENT EDUCATION:  Education details:  reviewed HEP Person educated: Patient Education method: Explanation, Demonstration, and Handouts Education comprehension: verbalized understanding    HOME EXERCISE PROGRAM: [id number]  ASSESSMENT:  CLINICAL IMPRESSION: Pt showing improved ability for strengthening and ther ex. Still challenged with most activities due to weakness. Gait mechanics improving. Pt to benefit from continued care.    OBJECTIVE IMPAIRMENTS decreased activity tolerance, decreased mobility, difficulty walking, decreased ROM, decreased strength, increased edema, improper body mechanics, postural dysfunction, and pain.   ACTIVITY LIMITATIONS sitting, standing, squatting, transfers, and bed mobility  PARTICIPATION LIMITATIONS: meal prep, cleaning, shopping, and community activity  PERSONAL FACTORS Age, Fitness, and 1-2 comorbidities: B corn pain, R ankle surgery history    REHAB POTENTIAL: Good  CLINICAL DECISION MAKING: Stable/uncomplicated  EVALUATION COMPLEXITY: Low   GOALS: Goals reviewed with patient?  yes  SHORT TERM GOALS:  Patient will be independent in self management strategies to improve quality of life and functional outcomes. Baseline: new program Target date: 10/14/2021 Goal status: INITIAL  2.  Patient will report at least 50% improvement in overall symptoms and/or function to demonstrate improved functional mobility Baseline: 0% Target date:10/14/2021 Goal status: INITIAL  3.  Patient will be able to perform pain free bed mobilities Baseline: painful Target date: 10/14/21 Goal status: INITIAL      LONG TERM GOALS:  Patient will report at least 75% improvement in overall symptoms and/or function to demonstrate improved functional mobility Baseline:  0% Target date: 9/11//2023 Goal status: INITIAL  2.  Patient will be able to report managing right leg pain with HEP. Baseline: new program Target date: 11/25/2021 Goal status: INITIAL  3.  Patient will be able to demonstrate painfree lumbar ROM Baseline: painful Target date: 11/25/2021 Goal status: INITIAL  4. Pt to demo improved hip strength to at least 4/5 to improve stability, pain, and gait.  Target date: 11/25/2021 Goal status: INITIAL/added 09/30/21       PLAN: PT FREQUENCY: 2x/week  PT DURATION: 8 weeks  PLANNED INTERVENTIONS: Therapeutic exercises, Therapeutic activity, Neuromuscular re-education, Balance training, Gait training, Patient/Family education, Joint mobilization, Aquatic Therapy, Dry Needling, Electrical stimulation, Spinal mobilization, Cryotherapy, Moist heat, Ultrasound, Ionotophoresis '4mg'$ /ml Dexamethasone, and Manual therapy.  PLAN FOR NEXT SESSION: hip/backmanul, LE, core strength, gait mechanics.   Lyndee Hensen, PT, DPT 3:00 PM  10/14/21

## 2021-10-16 ENCOUNTER — Encounter: Payer: Self-pay | Admitting: Physical Therapy

## 2021-10-16 ENCOUNTER — Ambulatory Visit
Admission: RE | Admit: 2021-10-16 | Discharge: 2021-10-16 | Disposition: A | Discharge status: Home / Self Care | Payer: Managed Care, Other (non HMO) | Source: Ambulatory Visit | Attending: Family Medicine | Admitting: Family Medicine

## 2021-10-16 ENCOUNTER — Ambulatory Visit: Payer: Commercial Managed Care - HMO | Admitting: Physical Therapy

## 2021-10-16 DIAGNOSIS — R262 Difficulty in walking, not elsewhere classified: Secondary | ICD-10-CM | POA: Diagnosis not present

## 2021-10-16 DIAGNOSIS — M5432 Sciatica, left side: Secondary | ICD-10-CM

## 2021-10-16 DIAGNOSIS — Z1231 Encounter for screening mammogram for malignant neoplasm of breast: Secondary | ICD-10-CM

## 2021-10-16 DIAGNOSIS — M5431 Sciatica, right side: Secondary | ICD-10-CM

## 2021-10-16 DIAGNOSIS — M6281 Muscle weakness (generalized): Secondary | ICD-10-CM | POA: Diagnosis not present

## 2021-10-16 NOTE — Therapy (Signed)
OUTPATIENT PHYSICAL THERAPY TREATMENT   Patient Name: Leah Powers MRN: [id number] DOB:17-Nov-1960, 61 y.o., female Today's Date: 10/16/2021   PT End of Session - 10/16/21 1107     Visit Number 7    Number of Visits 20    Date for PT Re-Evaluation 11/25/21    Authorization Type cigna,  recert done at visit 2 ;    PT Start Time 1105    PT Stop Time 1144    PT Time Calculation (min) 39 min    Activity Tolerance Patient tolerated treatment well    Behavior During Therapy [facility] for tasks assessed/performed                Past Medical History:  Diagnosis Date   Allergy    Arthritis    L hand, R ankle  (10/08/2016)   Bipolar 1 disorder ([place])    Depression    Diabetes mellitus without complication ([place])    Fibromyalgia    told that the breakout on the upper back , could be over active nerves , also treated with cream & Gabapentin- [facility] [place]    History of kidney stones    spontaneous passing   Hypertension    Hypothyroid    Pneumonia ~ 1967; 07/2016   Pyelonephritis 1997   treated with IV antibiotic    Past Surgical History:  Procedure Laterality Date   BLADDER INSTILLATION  X 2   CHALAZION EXCISION Bilateral    R- eye X2 , L eye- X1   FOOT ARTHRODESIS Right 10/08/2016   Procedure: RIGHT TRIPLE ARTHRODESIS;  Surgeon: Newt Minion, MD;  Location: [facility];  Service: Orthopedics;  Laterality: Right;   FOOT ARTHRODESIS, TRIPLE Right 10/08/2016   ankle   FRENULECTOMY, LINGUAL  1970s   LAPAROSCOPIC CHOLECYSTECTOMY  1996   PALATE SURGERY  1970s   tissue removed from upper palate as a child    URETHRAL DILATION     Patient Active Problem List   Diagnosis Date Noted   Nausea vomiting and diarrhea 05/20/2018   AKI (acute kidney injury) ([place]) 05/20/2018   Lithium toxicity 05/20/2018   Type 2 diabetes mellitus without complication, without long-term current use of insulin ([place]) 05/20/2018   Bipolar I disorder ([place]) 02/04/2018   S/P ankle fusion  11/12/2016   Posterior tibial tendon dysfunction (PTTD) of right lower extremity 10/08/2016   HTN (hypertension) 05/31/2014   Hypothyroidism 05/31/2014    PCP: Tawnya Crook, MD   REFERRING PROVIDER: Tawnya Crook, MD   REFERRING DIAG: [phone] (ICD-10-CM) - Bilateral sciatica  Rationale for Evaluation and Treatment Rehabilitation  THERAPY DIAG:  Difficulty in walking, not elsewhere classified  Muscle weakness (generalized)  Sciatica, left side  Sciatica, right side  ONSET DATE: chronic, right leg over the last month  SUBJECTIVE:  SUBJECTIVE STATEMENT: Pt with increased pain in R back/glute this am.    States she had a fall back in 36' and she has always had issues in the left hip. States it has intensified more since more as of late. States that the weather makes her feels terrible. She has been on prednisone and it helped a little bit. Reports that the pain has increased and is now going down her right leg and has gotten worse over the last month. States she has a 4 prong cane that she uses when she is in a lot of pain. She also has pain in her feet as she has corns and is waiting to have surgery to help with that. States that she has used her massager with minimal relief. Wants to learn what she can do for the pain.  PERTINENT HISTORY:  hx r ankle surgery, fibromyalgia, DB, bipolar, depression  PAIN:  Are you having pain? Yes: NPRS scale: 4/10 Pain location: left leg, low back  Pain description: dull achy Aggravating factors: sit to stand, standing, laying left side Relieving factors: rest   PRECAUTIONS: None  WEIGHT BEARING RESTRICTIONS No  FALLS:  Has patient fallen in last 6 months? No  LIVING ENVIRONMENT: Lives with: lives with their family Has following  equipment at home: quad cane  OCCUPATION: not currently working  PLOF: Independent   PATIENT GOALS learn ways to reduce pressure and pain in back. Improve static postures/posiitoning   OBJECTIVE:   DIAGNOSTIC FINDINGS:  No recent images   SCREENING FOR RED FLAGS: Bowel or bladder incontinence: No Spinal tumors: No Cauda equina syndrome: No Compression fracture: No Abdominal aneurysm: No  COGNITION:  Overall cognitive status: Within functional limits for tasks assessed      POSTURE/Swelling:  hyper extends bilateral knees, increased B swelling - wearing compression stocking, forward head  PALPATION:  tenderness in legs due to swelling R>L, increased tension in lumbar paraspinals -   LUMBAR ROM:   Active  A/PROM  eval  Flexion 50% limited  Extension 100% limited  Right lateral flexion 25% limited  Left lateral flexion 75% limited and painful  Right rotation   Left rotation    (Blank rows = not tested)     LE Measurements Lower Extremity Right 09/30/2021 Left 09/30/2021   A/PROM MMT A/PROM MMT  Hip Flexion WFL 4 WFL 4  Hip Extension      Hip Abduction  4-  3+*  Hip Adduction      Hip Internal rotation 30*  50   Hip External rotation 65  65   Knee Flexion WFL 4  4  Knee Extension Hyperextends  4+ Hyperextends  4+  Ankle Dorsiflexion  4*  4*  Ankle Plantarflexion      Ankle Inversion      Ankle Eversion       (Blank rows = not tested)  * pain  Used towel for MMT   LUMBAR SPECIAL TESTS:  SLR neg B, slump test neg B   FUNCTIONAL TESTS:  bed rolling painful and slow, STS uses arms slow and painful    TODAY'S TREATMENT   10/16/21: Therapeutic Exercise:  Aerobic:  Supine:  SLR with TA, 2 x 10 bil; Bridging  x 10;   Prone:   Seated: ;   Standing: Marching x 20; A/P weight shifts, and staggered stance weight shifts  (pre-gait)   Stretches:  LTR x 10 , seated piriformis 30 sec x 2 bil;    SKTC 30 sec  x 2 bil;  Gait:  Neuromuscular  Re-education: Manual Therapy:  long leg distraction hip and back.;  DTM/TPR To R glute.      PATIENT EDUCATION:  Education details:  reviewed HEP Person educated: Patient Education method: Explanation, Media planner, and Handouts Education comprehension: verbalized understanding    HOME EXERCISE PROGRAM: [id number]  ASSESSMENT:  CLINICAL IMPRESSION: Pt with more soreness in glute today, increased with standing/walking. Education on what to do if painful. Pt with decreased soreness after visit in standing and with walking. Plan to progress strength as tolerated.    OBJECTIVE IMPAIRMENTS decreased activity tolerance, decreased mobility, difficulty walking, decreased ROM, decreased strength, increased edema, improper body mechanics, postural dysfunction, and pain.   ACTIVITY LIMITATIONS sitting, standing, squatting, transfers, and bed mobility  PARTICIPATION LIMITATIONS: meal prep, cleaning, shopping, and community activity  PERSONAL FACTORS Age, Fitness, and 1-2 comorbidities: B corn pain, R ankle surgery history    REHAB POTENTIAL: Good  CLINICAL DECISION MAKING: Stable/uncomplicated  EVALUATION COMPLEXITY: Low   GOALS: Goals reviewed with patient?  yes  SHORT TERM GOALS:  Patient will be independent in self management strategies to improve quality of life and functional outcomes. Baseline: new program Target date: 10/14/2021 Goal status: INITIAL  2.  Patient will report at least 50% improvement in overall symptoms and/or function to demonstrate improved functional mobility Baseline: 0% Target date:10/14/2021 Goal status: INITIAL  3.  Patient will be able to perform pain free bed mobilities Baseline: painful Target date: 10/14/21 Goal status: INITIAL      LONG TERM GOALS:  Patient will report at least 75% improvement in overall symptoms and/or function to demonstrate improved functional mobility Baseline: 0% Target date: 9/11//2023 Goal status:  INITIAL  2.  Patient will be able to report managing right leg pain with HEP. Baseline: new program Target date: 11/25/2021 Goal status: INITIAL  3.  Patient will be able to demonstrate painfree lumbar ROM Baseline: painful Target date: 11/25/2021 Goal status: INITIAL  4. Pt to demo improved hip strength to at least 4/5 to improve stability, pain, and gait.  Target date: 11/25/2021 Goal status: INITIAL/added 09/30/21       PLAN: PT FREQUENCY: 2x/week  PT DURATION: 8 weeks  PLANNED INTERVENTIONS: Therapeutic exercises, Therapeutic activity, Neuromuscular re-education, Balance training, Gait training, Patient/Family education, Joint mobilization, Aquatic Therapy, Dry Needling, Electrical stimulation, Spinal mobilization, Cryotherapy, Moist heat, Ultrasound, Ionotophoresis '4mg'$ /ml Dexamethasone, and Manual therapy.  PLAN FOR NEXT SESSION: hip/backmanul, LE, core strength, gait mechanics.   Lyndee Hensen, PT, DPT 11:46 AM  10/16/21

## 2021-10-17 LAB — HM MAMMOGRAPHY

## 2021-10-18 ENCOUNTER — Encounter: Payer: Self-pay | Admitting: Family Medicine

## 2021-10-22 ENCOUNTER — Encounter: Payer: Managed Care, Other (non HMO) | Admitting: Physical Therapy

## 2021-10-22 ENCOUNTER — Telehealth: Payer: Self-pay | Admitting: Internal Medicine

## 2021-10-22 NOTE — Telephone Encounter (Signed)
Good Morning Dr. Lorenso Courier,   Supervising MD for 5/23 AM    We have received records from [facility]. Patient is  requesting a transfer of care due to insurance reasons. Patient is needing to be seen for iron deficiency anaemia. I will be sending records for you review, will you please review and advise on scheduling?   Thank you.

## 2021-10-24 ENCOUNTER — Ambulatory Visit: Payer: Commercial Managed Care - HMO | Admitting: Physical Therapy

## 2021-10-24 ENCOUNTER — Encounter: Payer: Self-pay | Admitting: Physical Therapy

## 2021-10-24 DIAGNOSIS — M5432 Sciatica, left side: Secondary | ICD-10-CM

## 2021-10-24 DIAGNOSIS — R262 Difficulty in walking, not elsewhere classified: Secondary | ICD-10-CM

## 2021-10-24 DIAGNOSIS — M5431 Sciatica, right side: Secondary | ICD-10-CM

## 2021-10-24 DIAGNOSIS — M6281 Muscle weakness (generalized): Secondary | ICD-10-CM | POA: Diagnosis not present

## 2021-10-24 NOTE — Therapy (Signed)
OUTPATIENT PHYSICAL THERAPY TREATMENT   Patient Name: Leah Powers MRN: [id number] DOB:May 15, 1960, 61 y.o., female Today's Date: 10/24/2021   PT End of Session - 10/24/21 1022     Visit Number 8    Number of Visits 20    Date for PT Re-Evaluation 11/25/21    Authorization Type cigna,  recert done at visit 2 ;    PT Start Time 1023    PT Stop Time 1101    PT Time Calculation (min) 38 min    Activity Tolerance Patient tolerated treatment well    Behavior During Therapy [facility] for tasks assessed/performed                Past Medical History:  Diagnosis Date   Allergy    Arthritis    L hand, R ankle  (10/08/2016)   Bipolar 1 disorder ([place])    Depression    Diabetes mellitus without complication ([place])    Fibromyalgia    told that the breakout on the upper back , could be over active nerves , also treated with cream & Gabapentin- [facility] [place]    History of kidney stones    spontaneous passing   Hypertension    Hypothyroid    Pneumonia ~ 1967; 07/2016   Pyelonephritis 1997   treated with IV antibiotic    Past Surgical History:  Procedure Laterality Date   BLADDER INSTILLATION  X 2   CHALAZION EXCISION Bilateral    R- eye X2 , L eye- X1   FOOT ARTHRODESIS Right 10/08/2016   Procedure: RIGHT TRIPLE ARTHRODESIS;  Surgeon: Newt Minion, MD;  Location: [facility];  Service: Orthopedics;  Laterality: Right;   FOOT ARTHRODESIS, TRIPLE Right 10/08/2016   ankle   FRENULECTOMY, LINGUAL  1970s   LAPAROSCOPIC CHOLECYSTECTOMY  1996   PALATE SURGERY  1970s   tissue removed from upper palate as a child    URETHRAL DILATION     Patient Active Problem List   Diagnosis Date Noted   Nausea vomiting and diarrhea 05/20/2018   AKI (acute kidney injury) ([place]) 05/20/2018   Lithium toxicity 05/20/2018   Type 2 diabetes mellitus without complication, without long-term current use of insulin ([place]) 05/20/2018   Bipolar I disorder ([place]) 02/04/2018   S/P ankle fusion  11/12/2016   Posterior tibial tendon dysfunction (PTTD) of right lower extremity 10/08/2016   HTN (hypertension) 05/31/2014   Hypothyroidism 05/31/2014    PCP: Tawnya Crook, MD   REFERRING PROVIDER: Tawnya Crook, MD   REFERRING DIAG: [phone] (ICD-10-CM) - Bilateral sciatica  Rationale for Evaluation and Treatment Rehabilitation  THERAPY DIAG:  Difficulty in walking, not elsewhere classified  Muscle weakness (generalized)  Sciatica, left side  Sciatica, right side  ONSET DATE: chronic, right leg over the last month  SUBJECTIVE:  SUBJECTIVE STATEMENT: Pt with variable pain levels/days. Some days she is still feeling pain in L posterior/lateral ribs, states area of most pain lately. Also still has some pain in R glute and bil  ischial tuberosity.   States she had a fall back in 65' and she has always had issues in the left hip. States it has intensified more since more as of late. States that the weather makes her feels terrible. She has been on prednisone and it helped a little bit. Reports that the pain has increased and is now going down her right leg and has gotten worse over the last month. States she has a 4 prong cane that she uses when she is in a lot of pain. She also has pain in her feet as she has corns and is waiting to have surgery to help with that. States that she has used her massager with minimal relief. Wants to learn what she can do for the pain.  PERTINENT HISTORY:  hx r ankle surgery, fibromyalgia, DB, bipolar, depression  PAIN:  Are you having pain? Yes: NPRS scale: 4/10 Pain location: left leg, low back  Pain description: dull achy Aggravating factors: sit to stand, standing, laying left side Relieving factors: rest   PRECAUTIONS: None  WEIGHT BEARING  RESTRICTIONS No  FALLS:  Has patient fallen in last 6 months? No  LIVING ENVIRONMENT: Lives with: lives with their family Has following equipment at home: quad cane  OCCUPATION: not currently working  PLOF: Independent   PATIENT GOALS learn ways to reduce pressure and pain in back. Improve static postures/posiitoning   OBJECTIVE:   DIAGNOSTIC FINDINGS:  No recent images   SCREENING FOR RED FLAGS: Bowel or bladder incontinence: No Spinal tumors: No Cauda equina syndrome: No Compression fracture: No Abdominal aneurysm: No  COGNITION:  Overall cognitive status: Within functional limits for tasks assessed      POSTURE/Swelling:  hyper extends bilateral knees, increased B swelling - wearing compression stocking, forward head  PALPATION:  tenderness in legs due to swelling R>L, increased tension in lumbar paraspinals -   LUMBAR ROM:   Active  A/PROM  eval  Flexion 50% limited  Extension 100% limited  Right lateral flexion 25% limited  Left lateral flexion 75% limited and painful  Right rotation   Left rotation    (Blank rows = not tested)     LE Measurements Lower Extremity Right 09/30/2021 Left 09/30/2021   A/PROM MMT A/PROM MMT  Hip Flexion WFL 4 WFL 4  Hip Extension      Hip Abduction  4-  3+*  Hip Adduction      Hip Internal rotation 30*  50   Hip External rotation 65  65   Knee Flexion WFL 4  4  Knee Extension Hyperextends  4+ Hyperextends  4+  Ankle Dorsiflexion  4*  4*  Ankle Plantarflexion      Ankle Inversion      Ankle Eversion       (Blank rows = not tested)  * pain  Used towel for MMT   LUMBAR SPECIAL TESTS:  SLR neg B, slump test neg B   FUNCTIONAL TESTS:  bed rolling painful and slow, STS uses arms slow and painful   TODAY'S TREATMENT    8/10 /23: Therapeutic Exercise:  Aerobic:  Supine:  SLR with TA, 2 x 10 bil;   Bridging  x 10;  TA contraction with breathing and eduction x 5 min,  TA x 10 ;  S/L : hip abd 2x10 on L;    Seated: ;   Standing: Side bending/QL stretch 30 sec x 3 bil;  Stretches:  LTR x 10 with UE flexion  , S/L QL stretch with UE overhead x 3 on L;  Gait:  Neuromuscular Re-education: Manual Therapy:  long leg distraction hip and back.;  Lower Rib mobs with breathing  on L;     PATIENT EDUCATION:  Education details:  reviewed and updated HEP Person educated: Patient Education method: Explanation, Demonstration, and Handouts Education comprehension: verbalized understanding    HOME EXERCISE PROGRAM: [id number]  ASSESSMENT:  CLINICAL IMPRESSION: Pt with soreness in L lateral ribs today with palpation and stretching. She also has soreness with TA contraction in more anterior muscles. Education and practice for breathing with TA contraction today. She will continue to work on strengthening for this with HEP. Plan to progress as tolerated.     OBJECTIVE IMPAIRMENTS decreased activity tolerance, decreased mobility, difficulty walking, decreased ROM, decreased strength, increased edema, improper body mechanics, postural dysfunction, and pain.   ACTIVITY LIMITATIONS sitting, standing, squatting, transfers, and bed mobility  PARTICIPATION LIMITATIONS: meal prep, cleaning, shopping, and community activity  PERSONAL FACTORS Age, Fitness, and 1-2 comorbidities: B corn pain, R ankle surgery history    REHAB POTENTIAL: Good  CLINICAL DECISION MAKING: Stable/uncomplicated  EVALUATION COMPLEXITY: Low   GOALS: Goals reviewed with patient?  yes  SHORT TERM GOALS:  Patient will be independent in self management strategies to improve quality of life and functional outcomes. Baseline: new program Target date: 10/14/2021 Goal status: INITIAL  2.  Patient will report at least 50% improvement in overall symptoms and/or function to demonstrate improved functional mobility Baseline: 0% Target date:10/14/2021 Goal status: INITIAL  3.  Patient will be able to perform pain free bed  mobilities Baseline: painful Target date: 10/14/21 Goal status: INITIAL      LONG TERM GOALS:  Patient will report at least 75% improvement in overall symptoms and/or function to demonstrate improved functional mobility Baseline: 0% Target date: 9/11//2023 Goal status: INITIAL  2.  Patient will be able to report managing right leg pain with HEP. Baseline: new program Target date: 11/25/2021 Goal status: INITIAL  3.  Patient will be able to demonstrate painfree lumbar ROM Baseline: painful Target date: 11/25/2021 Goal status: INITIAL  4. Pt to demo improved hip strength to at least 4/5 to improve stability, pain, and gait.  Target date: 11/25/2021 Goal status: INITIAL/added 09/30/21       PLAN: PT FREQUENCY: 2x/week  PT DURATION: 8 weeks  PLANNED INTERVENTIONS: Therapeutic exercises, Therapeutic activity, Neuromuscular re-education, Balance training, Gait training, Patient/Family education, Joint mobilization, Aquatic Therapy, Dry Needling, Electrical stimulation, Spinal mobilization, Cryotherapy, Moist heat, Ultrasound, Ionotophoresis '4mg'$ /ml Dexamethasone, and Manual therapy.  PLAN FOR NEXT SESSION: hip/backmanul, LE, core strength, gait mechanics.   Lyndee Hensen, PT, DPT 10:22 AM  10/24/21

## 2021-10-24 NOTE — Telephone Encounter (Signed)
LVM for patient to call back to schedule OV.  

## 2021-10-28 ENCOUNTER — Encounter: Payer: Self-pay | Admitting: Physical Therapy

## 2021-10-28 ENCOUNTER — Ambulatory Visit: Payer: Commercial Managed Care - HMO | Admitting: Physical Therapy

## 2021-10-28 DIAGNOSIS — M5432 Sciatica, left side: Secondary | ICD-10-CM | POA: Diagnosis not present

## 2021-10-28 DIAGNOSIS — M6281 Muscle weakness (generalized): Secondary | ICD-10-CM | POA: Diagnosis not present

## 2021-10-28 DIAGNOSIS — R262 Difficulty in walking, not elsewhere classified: Secondary | ICD-10-CM

## 2021-10-28 DIAGNOSIS — M5431 Sciatica, right side: Secondary | ICD-10-CM | POA: Diagnosis not present

## 2021-10-28 NOTE — Therapy (Signed)
OUTPATIENT PHYSICAL THERAPY TREATMENT   Patient Name: CONSTANZA MINCY MRN: [id number] DOB:1960/07/31, 61 y.o., female Today's Date: 10/28/2021   PT End of Session - 10/28/21 1010     Visit Number 9    Number of Visits 20    Date for PT Re-Evaluation 11/25/21    Authorization Type cigna,  recert done at visit 2 ;    PT Start Time 1015    PT Stop Time 1100    PT Time Calculation (min) 45 min    Activity Tolerance Patient tolerated treatment well    Behavior During Therapy [facility] for tasks assessed/performed                Past Medical History:  Diagnosis Date   Allergy    Arthritis    L hand, R ankle  (10/08/2016)   Bipolar 1 disorder ([place])    Depression    Diabetes mellitus without complication ([place])    Fibromyalgia    told that the breakout on the upper back , could be over active nerves , also treated with cream & Gabapentin- [facility] [place]    History of kidney stones    spontaneous passing   Hypertension    Hypothyroid    Pneumonia ~ 1967; 07/2016   Pyelonephritis 1997   treated with IV antibiotic    Past Surgical History:  Procedure Laterality Date   BLADDER INSTILLATION  X 2   CHALAZION EXCISION Bilateral    R- eye X2 , L eye- X1   FOOT ARTHRODESIS Right 10/08/2016   Procedure: RIGHT TRIPLE ARTHRODESIS;  Surgeon: Newt Minion, MD;  Location: [place];  Service: Orthopedics;  Laterality: Right;   FOOT ARTHRODESIS, TRIPLE Right 10/08/2016   ankle   FRENULECTOMY, LINGUAL  1970s   LAPAROSCOPIC CHOLECYSTECTOMY  1996   PALATE SURGERY  1970s   tissue removed from upper palate as a child    URETHRAL DILATION     Patient Active Problem List   Diagnosis Date Noted   Nausea vomiting and diarrhea 05/20/2018   AKI (acute kidney injury) ([place]) 05/20/2018   Lithium toxicity 05/20/2018   Type 2 diabetes mellitus without complication, without long-term current use of insulin ([place]) 05/20/2018   Bipolar I disorder ([place]) 02/04/2018   S/P ankle fusion  11/12/2016   Posterior tibial tendon dysfunction (PTTD) of right lower extremity 10/08/2016   HTN (hypertension) 05/31/2014   Hypothyroidism 05/31/2014    PCP: Tawnya Crook, MD   REFERRING PROVIDER: Tawnya Crook, MD   REFERRING DIAG: [phone] (ICD-10-CM) - Bilateral sciatica  Rationale for Evaluation and Treatment Rehabilitation  THERAPY DIAG:  Difficulty in walking, not elsewhere classified  Muscle weakness (generalized)  Sciatica, left side  Sciatica, right side  ONSET DATE: chronic, right leg over the last month  SUBJECTIVE:  SUBJECTIVE STATEMENT: Pt with increased soreness in L side torso/ribs yesterday afternoon. Had to lay with ice pack for several hours to relieve. States she has had pain cycles like this for years, in L side of back. She did do increased activity with UEs, cleaning the day prior. Minimal pain today in this region.   States she had a fall back in 25' and she has always had issues in the left hip. States it has intensified more since more as of late. States that the weather makes her feels terrible. She has been on prednisone and it helped a little bit. Reports that the pain has increased and is now going down her right leg and has gotten worse over the last month. States she has a 4 prong cane that she uses when she is in a lot of pain. She also has pain in her feet as she has corns and is waiting to have surgery to help with that. States that she has used her massager with minimal relief. Wants to learn what she can do for the pain.  PERTINENT HISTORY:  hx r ankle surgery, fibromyalgia, DB, bipolar, depression  PAIN:  Are you having pain? Yes: NPRS scale: 4/10 Pain location: left leg, low back  Pain description: dull achy Aggravating factors: sit to stand,  standing, laying left side Relieving factors: rest   PRECAUTIONS: None  WEIGHT BEARING RESTRICTIONS No  FALLS:  Has patient fallen in last 6 months? No  LIVING ENVIRONMENT: Lives with: lives with their family Has following equipment at home: quad cane  OCCUPATION: not currently working  PLOF: Independent   PATIENT GOALS learn ways to reduce pressure and pain in back. Improve static postures/posiitoning   OBJECTIVE:   DIAGNOSTIC FINDINGS:  No recent images   SCREENING FOR RED FLAGS: Bowel or bladder incontinence: No Spinal tumors: No Cauda equina syndrome: No Compression fracture: No Abdominal aneurysm: No  COGNITION:  Overall cognitive status: Within functional limits for tasks assessed      POSTURE/Swelling:  hyper extends bilateral knees, increased B swelling - wearing compression stocking, forward head  PALPATION:  tenderness in legs due to swelling R>L, increased tension in lumbar paraspinals -   LUMBAR ROM:   Active  A/PROM  eval  Flexion 50% limited  Extension 100% limited  Right lateral flexion 25% limited  Left lateral flexion 75% limited and painful  Right rotation   Left rotation    (Blank rows = not tested)     LE Measurements Lower Extremity Right 09/30/2021 Left 09/30/2021   A/PROM MMT A/PROM MMT  Hip Flexion WFL 4 WFL 4  Hip Extension      Hip Abduction  4-  3+*  Hip Adduction      Hip Internal rotation 30*  50   Hip External rotation 65  65   Knee Flexion WFL 4  4  Knee Extension Hyperextends  4+ Hyperextends  4+  Ankle Dorsiflexion  4*  4*  Ankle Plantarflexion      Ankle Inversion      Ankle Eversion       (Blank rows = not tested)  * pain  Used towel for MMT   LUMBAR SPECIAL TESTS:  SLR neg B, slump test neg B   FUNCTIONAL TESTS:  bed rolling painful and slow, STS uses arms slow and painful   TODAY'S TREATMENT   10/28/21: Therapeutic Exercise: Aerobic: Supine:  SLR with TA,  x 10 bil;   Bridging  2 x 10;  TA  contraction with breathing and eduction x 5 min,    S/L : hip abd 2x10 on L;  Shoulder flexion with TA/full motion x 15;  Seated: Sit to stand with TA x 10;  Standing: ;  Rows Gtb x 20;  Low Row/alternating GTB x 15; Paloff press double GTB x 15 bil; hip abd 2x10; slow march x 20;  Stretches:  LTR x 10;  Side bending/QL stretch 30 sec x 3 bil Gait:  Neuromuscular Re-education: Manual Therapy:  long leg distraction hip and back.;  Lower Rib mobs with breathing  on L;    8/10 /23: Therapeutic Exercise:  Aerobic:  Supine:  SLR with TA, 2 x 10 bil;   Bridging  x 10;  TA contraction with breathing and eduction x 5 min,  TA x 10 ;  S/L : hip abd 2x10 on L;   Seated: ;   Standing: Side bending/QL stretch 30 sec x 3 bil; Stretches:  LTR x 10 with UE flexion  , S/L QL stretch with UE overhead x 3 on L;  Gait:  Neuromuscular Re-education: Manual Therapy:  long leg distraction hip and back.;  Lower Rib mobs with breathing  on L;     PATIENT EDUCATION:  Education details:  reviewed and updated HEP Person educated: Patient Education method: Explanation, Demonstration, and Handouts Education comprehension: verbalized understanding    HOME EXERCISE PROGRAM: [id number]  ASSESSMENT:  CLINICAL IMPRESSION: Pt with good tolerance for activity today. Added more UE and core strength, with focus on TA for stabilization and no pain in back/ribs. Plan to progress strength/stability as tolerated.     OBJECTIVE IMPAIRMENTS decreased activity tolerance, decreased mobility, difficulty walking, decreased ROM, decreased strength, increased edema, improper body mechanics, postural dysfunction, and pain.   ACTIVITY LIMITATIONS sitting, standing, squatting, transfers, and bed mobility  PARTICIPATION LIMITATIONS: meal prep, cleaning, shopping, and community activity  PERSONAL FACTORS Age, Fitness, and 1-2 comorbidities: B corn pain, R ankle surgery history    REHAB POTENTIAL: Good  CLINICAL DECISION  MAKING: Stable/uncomplicated  EVALUATION COMPLEXITY: Low   GOALS: Goals reviewed with patient?  yes  SHORT TERM GOALS:  Patient will be independent in self management strategies to improve quality of life and functional outcomes. Baseline: new program Target date: 10/14/2021 Goal status: INITIAL  2.  Patient will report at least 50% improvement in overall symptoms and/or function to demonstrate improved functional mobility Baseline: 0% Target date:10/14/2021 Goal status: INITIAL  3.  Patient will be able to perform pain free bed mobilities Baseline: painful Target date: 10/14/21 Goal status: INITIAL      LONG TERM GOALS:  Patient will report at least 75% improvement in overall symptoms and/or function to demonstrate improved functional mobility Baseline: 0% Target date: 9/11//2023 Goal status: INITIAL  2.  Patient will be able to report managing right leg pain with HEP. Baseline: new program Target date: 11/25/2021 Goal status: INITIAL  3.  Patient will be able to demonstrate painfree lumbar ROM Baseline: painful Target date: 11/25/2021 Goal status: INITIAL  4. Pt to demo improved hip strength to at least 4/5 to improve stability, pain, and gait.  Target date: 11/25/2021 Goal status: INITIAL/added 09/30/21       PLAN: PT FREQUENCY: 2x/week  PT DURATION: 8 weeks  PLANNED INTERVENTIONS: Therapeutic exercises, Therapeutic activity, Neuromuscular re-education, Balance training, Gait training, Patient/Family education, Joint mobilization, Aquatic Therapy, Dry Needling, Electrical stimulation, Spinal mobilization, Cryotherapy, Moist heat, Ultrasound, Ionotophoresis '4mg'$ /ml Dexamethasone, and Manual therapy.  PLAN FOR NEXT  SESSION: hip/backmanul, LE, core strength, gait mechanics.   Lyndee Hensen, PT, DPT 10:10 AM  10/28/21

## 2021-10-30 ENCOUNTER — Encounter: Payer: Self-pay | Admitting: Physical Therapy

## 2021-10-30 ENCOUNTER — Ambulatory Visit: Payer: Commercial Managed Care - HMO | Admitting: Physical Therapy

## 2021-10-30 DIAGNOSIS — M5432 Sciatica, left side: Secondary | ICD-10-CM

## 2021-10-30 DIAGNOSIS — M6281 Muscle weakness (generalized): Secondary | ICD-10-CM

## 2021-10-30 DIAGNOSIS — M5431 Sciatica, right side: Secondary | ICD-10-CM

## 2021-10-30 DIAGNOSIS — R262 Difficulty in walking, not elsewhere classified: Secondary | ICD-10-CM

## 2021-10-30 NOTE — Therapy (Signed)
OUTPATIENT PHYSICAL THERAPY TREATMENT   Patient Name: MAYLEE BARE MRN: [id number] DOB:January 12, 1961, 61 y.o., female Today's Date: 10/28/2021   PT End of Session - 10/30/21 1031     Visit Number 10    Number of Visits 20    Date for PT Re-Evaluation 11/25/21    Authorization Type cigna,  recert done at visit 2 ;    PT Start Time 1026    PT Stop Time 1105    PT Time Calculation (min) 39 min    Activity Tolerance Patient tolerated treatment well    Behavior During Therapy [facility] for tasks assessed/performed                Past Medical History:  Diagnosis Date   Allergy    Arthritis    L hand, R ankle  (10/08/2016)   Bipolar 1 disorder ([place])    Depression    Diabetes mellitus without complication ([place])    Fibromyalgia    told that the breakout on the upper back , could be over active nerves , also treated with cream & Gabapentin- [facility] [place]    History of kidney stones    spontaneous passing   Hypertension    Hypothyroid    Pneumonia ~ 1967; 07/2016   Pyelonephritis 1997   treated with IV antibiotic    Past Surgical History:  Procedure Laterality Date   BLADDER INSTILLATION  X 2   CHALAZION EXCISION Bilateral    R- eye X2 , L eye- X1   FOOT ARTHRODESIS Right 10/08/2016   Procedure: RIGHT TRIPLE ARTHRODESIS;  Surgeon: Newt Minion, MD;  Location: [place];  Service: Orthopedics;  Laterality: Right;   FOOT ARTHRODESIS, TRIPLE Right 10/08/2016   ankle   FRENULECTOMY, LINGUAL  1970s   LAPAROSCOPIC CHOLECYSTECTOMY  1996   PALATE SURGERY  1970s   tissue removed from upper palate as a child    URETHRAL DILATION     Patient Active Problem List   Diagnosis Date Noted   Nausea vomiting and diarrhea 05/20/2018   AKI (acute kidney injury) ([place]) 05/20/2018   Lithium toxicity 05/20/2018   Type 2 diabetes mellitus without complication, without long-term current use of insulin ([place]) 05/20/2018   Bipolar I disorder ([place]) 02/04/2018   S/P ankle fusion  11/12/2016   Posterior tibial tendon dysfunction (PTTD) of right lower extremity 10/08/2016   HTN (hypertension) 05/31/2014   Hypothyroidism 05/31/2014    PCP: Tawnya Crook, MD   REFERRING PROVIDER: Tawnya Crook, MD   REFERRING DIAG: [id number] (ICD-10-CM) - Bilateral sciatica  Rationale for Evaluation and Treatment Rehabilitation  THERAPY DIAG:  Difficulty in walking, not elsewhere classified  Muscle weakness (generalized)  Sciatica, left side  Sciatica, right side  ONSET DATE: chronic, right leg over the last month  SUBJECTIVE:  SUBJECTIVE STATEMENT: Had uncomfortable cramping in L HS region last night, didn't sleep well.   States she had a fall back in 28' and she has always had issues in the left hip. States it has intensified more since more as of late. States that the weather makes her feels terrible. She has been on prednisone and it helped a little bit. Reports that the pain has increased and is now going down her right leg and has gotten worse over the last month. States she has a 4 prong cane that she uses when she is in a lot of pain. She also has pain in her feet as she has corns and is waiting to have surgery to help with that. States that she has used her massager with minimal relief. Wants to learn what she can do for the pain.  PERTINENT HISTORY:  hx r ankle surgery, fibromyalgia, DB, bipolar, depression  PAIN:  Are you having pain? Yes: NPRS scale: 4/10 Pain location: left leg, low back  Pain description: dull achy Aggravating factors: sit to stand, standing, laying left side Relieving factors: rest   PRECAUTIONS: None  WEIGHT BEARING RESTRICTIONS No  FALLS:  Has patient fallen in last 6 months? No  LIVING ENVIRONMENT: Lives with: lives with their  family Has following equipment at home: quad cane  OCCUPATION: not currently working  PLOF: Independent   PATIENT GOALS learn ways to reduce pressure and pain in back. Improve static postures/posiitoning   OBJECTIVE:   DIAGNOSTIC FINDINGS:  No recent images   SCREENING FOR RED FLAGS: Bowel or bladder incontinence: No Spinal tumors: No Cauda equina syndrome: No Compression fracture: No Abdominal aneurysm: No  COGNITION:  Overall cognitive status: Within functional limits for tasks assessed      POSTURE/Swelling:  hyper extends bilateral knees, increased B swelling - wearing compression stocking, forward head  PALPATION:  tenderness in legs due to swelling R>L, increased tension in lumbar paraspinals -   LUMBAR ROM:   Active  A/PROM  eval  Flexion 50% limited  Extension 100% limited  Right lateral flexion 25% limited  Left lateral flexion 75% limited and painful  Right rotation   Left rotation    (Blank rows = not tested)     LE Measurements Lower Extremity Right 09/30/2021 Left 09/30/2021   A/PROM MMT A/PROM MMT  Hip Flexion WFL 4 WFL 4  Hip Extension      Hip Abduction  4-  3+*  Hip Adduction      Hip Internal rotation 30*  50   Hip External rotation 65  65   Knee Flexion WFL 4  4  Knee Extension Hyperextends  4+ Hyperextends  4+  Ankle Dorsiflexion  4*  4*  Ankle Plantarflexion      Ankle Inversion      Ankle Eversion       (Blank rows = not tested)  * pain  Used towel for MMT   LUMBAR SPECIAL TESTS:  SLR neg B, slump test neg B   FUNCTIONAL TESTS:  bed rolling painful and slow, STS uses arms slow and painful   TODAY'S TREATMENT   10/30/21: Therapeutic Exercise: Aerobic: Supine:  SLR with TA, 2 x 10 bil;   Bridging  2 x 10;  S/L : hip abd 2x10 on L;  Shoulder flexion with TA/full motion x 15;  Seated:  Standing: [place] AROM x 10 bil;   Rows Gtb x 20;  Low Row/alternating GTB x 15;  hip abd 2x10; slow  march x 20;  mini squats x 20;   Stretches:  LTR x 10;  Standing Side bending/QL stretch 30 sec x 3 bil Gait:  Neuromuscular Re-education: Manual Therapy:     10/28/21: Therapeutic Exercise: Aerobic: Supine:  SLR with TA,  x 10 bil;   Bridging  2 x 10;  TA contraction with breathing and eduction x 5 min,    S/L : hip abd 2x10 on L;  Shoulder flexion with TA/full motion x 15;  Seated: Sit to stand with TA x 10;  Standing: ;  Rows Gtb x 20;  Low Row/alternating GTB x 15; Paloff press double GTB x 15 bil; hip abd 2x10; slow march x 20;  Stretches:  LTR x 10;  Side bending/QL stretch 30 sec x 3 bil Gait:  Neuromuscular Re-education: Manual Therapy:  long leg distraction hip and back.;  Lower Rib mobs with breathing  on L;    8/10 /23: Therapeutic Exercise:  Aerobic:  Supine:  SLR with TA, 2 x 10 bil;   Bridging  x 10;  TA contraction with breathing and eduction x 5 min,  TA x 10 ;  S/L : hip abd 2x10 on L;   Seated: ;   Standing: Side bending/QL stretch 30 sec x 3 bil; Stretches:  LTR x 10 with UE flexion  , S/L QL stretch with UE overhead x 3 on L;  Gait:  Neuromuscular Re-education: Manual Therapy:  long leg distraction hip and back.;  Lower Rib mobs with breathing  on L;     PATIENT EDUCATION:  Education details:  reviewed HEP Person educated: Patient Education method: Explanation, Demonstration, and Handouts Education comprehension: verbalized understanding    HOME EXERCISE PROGRAM: [id number]  ASSESSMENT:  CLINICAL IMPRESSION: Pt improving with general mobility. Reports moving much better at home, with less pain. She still has weakness in LEs, and core, and will benefit from continued care. Discussed calling podiatry for updated orthotics.   OBJECTIVE IMPAIRMENTS decreased activity tolerance, decreased mobility, difficulty walking, decreased ROM, decreased strength, increased edema, improper body mechanics, postural dysfunction, and pain.   ACTIVITY LIMITATIONS sitting, standing, squatting,  transfers, and bed mobility  PARTICIPATION LIMITATIONS: meal prep, cleaning, shopping, and community activity  PERSONAL FACTORS Age, Fitness, and 1-2 comorbidities: B corn pain, R ankle surgery history    REHAB POTENTIAL: Good  CLINICAL DECISION MAKING: Stable/uncomplicated  EVALUATION COMPLEXITY: Low   GOALS: Goals reviewed with patient?  yes  SHORT TERM GOALS:  Patient will be independent in self management strategies to improve quality of life and functional outcomes. Baseline: new program Target date: 10/14/2021 Goal status: INITIAL  2.  Patient will report at least 50% improvement in overall symptoms and/or function to demonstrate improved functional mobility Baseline: 0% Target date:10/14/2021 Goal status: INITIAL  3.  Patient will be able to perform pain free bed mobilities Baseline: painful Target date: 10/14/21 Goal status: INITIAL      LONG TERM GOALS:  Patient will report at least 75% improvement in overall symptoms and/or function to demonstrate improved functional mobility Baseline: 0% Target date: 9/11//2023 Goal status: INITIAL  2.  Patient will be able to report managing right leg pain with HEP. Baseline: new program Target date: 11/25/2021 Goal status: INITIAL  3.  Patient will be able to demonstrate painfree lumbar ROM Baseline: painful Target date: 11/25/2021 Goal status: INITIAL  4. Pt to demo improved hip strength to at least 4/5 to improve stability, pain, and gait.  Target date: 11/25/2021 Goal status: INITIAL/added  09/30/21       PLAN: PT FREQUENCY: 2x/week  PT DURATION: 8 weeks  PLANNED INTERVENTIONS: Therapeutic exercises, Therapeutic activity, Neuromuscular re-education, Balance training, Gait training, Patient/Family education, Joint mobilization, Aquatic Therapy, Dry Needling, Electrical stimulation, Spinal mobilization, Cryotherapy, Moist heat, Ultrasound, Ionotophoresis '4mg'$ /ml Dexamethasone, and Manual therapy.  PLAN FOR  NEXT SESSION: hip/backmanul, LE, core strength, gait mechanics.   Lyndee Hensen, PT, DPT 10:31 AM  10/30/21

## 2021-10-31 ENCOUNTER — Ambulatory Visit (INDEPENDENT_AMBULATORY_CARE_PROVIDER_SITE_OTHER): Payer: Commercial Managed Care - HMO | Admitting: Physician Assistant

## 2021-10-31 ENCOUNTER — Encounter: Payer: Self-pay | Admitting: Physician Assistant

## 2021-10-31 DIAGNOSIS — E032 Hypothyroidism due to medicaments and other exogenous substances: Secondary | ICD-10-CM

## 2021-10-31 DIAGNOSIS — Z79899 Other long term (current) drug therapy: Secondary | ICD-10-CM | POA: Diagnosis not present

## 2021-10-31 DIAGNOSIS — F319 Bipolar disorder, unspecified: Secondary | ICD-10-CM | POA: Diagnosis not present

## 2021-10-31 MED ORDER — LITHIUM CARBONATE 300 MG PO CAPS: 900.0000 mg | ORAL_CAPSULE | Freq: Every day | ORAL | 1 refills | Status: DC

## 2021-10-31 NOTE — Progress Notes (Signed)
[facility]  Patient ID: Leah Powers,  MRN: [id number]  PCP: Tawnya Crook, MD  Date of Evaluation: 10/31/2021 Time spent:30 minutes  Chief Complaint:  Chief Complaint   Follow-up    HISTORY/CURRENT STATUS: HPI for routine med check.  Doing well w/ her meds for the most part. Husband isn't working as much as he did. It was stressful having him home more often, 'we've had to get a calender so we know what we're both doing.' Plus she's missing out on time for herself. But it's getting better.   Patient is able to enjoy things.  Energy and motivation are good.  No extreme sadness, tearfulness, or feelings of hopelessness.  Sleeps well most of the time. ADLs and personal hygiene are normal.   Denies any changes in concentration, making decisions, or remembering things.  Appetite has not changed.  Weight is stable. Not having a lot of anxiety.  Denies suicidal or homicidal thoughts.  Patient denies increased energy with decreased need for sleep, increased talkativeness, racing thoughts, impulsivity or risky behaviors, increased spending, increased libido, grandiosity, increased irritability or anger, paranoia, and no hallucinations.  Denies dizziness, syncope, seizures, numbness, tingling, tremor, tics, unsteady gait, slurred speech, confusion. Denies muscle or joint pain, stiffness, or dystonia.  Individual Medical History/ Review of Systems: Changes? :Yes    is seeing PT for difficulty walking. Also had infected toe, healed now.   Past medications for mental health diagnoses include: Prolixin, Haldol, Ativan, lithium, Xanax, Synthroid, gabapentin  Allergies: Eucalyptus oil, Haldol [haloperidol decanoate], Molds & smuts, Other, Penicillins, Bee venom, Corn oil, Corn-containing products, Demeclocycline, Diflunisal, Haloperidol lactate, Hydrochlorothiazide, Hydrocodone-acetaminophen, Imipramine, Metformin, Metformin and related, Nitrofurantoin, Penicillin g,  Sulfamethoxazole-trimethoprim, Tetracycline hcl, Vicodin [hydrocodone-acetaminophen], Cephalexin, Doxycycline, Erythromycin, Macrodantin, Motrin [ibuprofen], Septra [bactrim], Tetracyclines & related, and Tofranil-pm  Current Medications:  Current Outpatient Medications:    albuterol (VENTOLIN HFA) 108 (90 Base) MCG/ACT inhaler, Inhale 2 puffs into the lungs every 6 (six) hours as needed for wheezing or shortness of breath., Disp: 1 each, Rfl: 2   B Complex Vitamins (B-COMPLEX/B-12) TABS, , Disp: , Rfl:    cetirizine (ZYRTEC) 5 MG tablet, Take 5 mg by mouth 2 (two) times daily as needed for allergies. , Disp: , Rfl:    Dulaglutide (TRULICITY) 7.40 CX/4.4YJ SOPN, Inject 0.75 mg into the skin once a week., Disp: 6 mL, Rfl: 1   Elastic Bandages & Supports (MEDICAL COMPRESSION SOCKS) MISC, Wear compression stockings daily with upright activity. Remove at bedtime., Disp: 2 each, Rfl: 0   FARXIGA 5 MG TABS tablet, Take 1 tablet by mouth once daily, Disp: 90 tablet, Rfl: 1   furosemide (LASIX) 20 MG tablet, Take 1 tablet (20 mg total) by mouth daily., Disp: 90 tablet, Rfl: 3   gabapentin (NEURONTIN) 300 MG capsule, Take as directed ('300mg'$  5 times a day), Disp: 450 capsule, Rfl: 4   Lancets (ONETOUCH DELICA PLUS [id number]) MISC, Inject 1 Stick into the skin daily., Disp: 100 each, Rfl: 2   levothyroxine (SYNTHROID) 125 MCG tablet, TAKE ONE TABLET BY MOUTH DAILY BEFORE BREAKFAST, Disp: 90 tablet, Rfl: 1   losartan (COZAAR) 50 MG tablet, Take 1 tablet (50 mg total) by mouth daily., Disp: 90 tablet, Rfl: 3   mupirocin ointment (BACTROBAN) 2 %, Apply topical to aa qd, Disp: 22 g, Rfl: 3   naproxen sodium (ALEVE) 220 MG tablet, Take 220 mg by mouth daily as needed (pain)., Disp: , Rfl:    ONETOUCH VERIO test strip, 1 each  by Other route daily., Disp: 100 each, Rfl: 3   hydrocortisone 2.5 % cream, Apply topically., Disp: , Rfl:    ketoconazole (NIZORAL) 2 % shampoo, Apply 1 Application topically 3 (three) times  a week., Disp: , Rfl:    lithium carbonate 300 MG capsule, Take 3 capsules (900 mg total) by mouth at bedtime., Disp: 270 capsule, Rfl: 1   rosuvastatin (CRESTOR) 20 MG tablet, Take 1 tablet (20 mg total) by mouth daily., Disp: 90 tablet, Rfl: 1 Medication Side Effects: none  Family Medical/ Social History: Changes? no  MENTAL HEALTH EXAM:  Last menstrual period 07/30/2013.There is no height or weight on file to calculate BMI.  General Appearance: Casual, Well Groomed, and Obese  Eye Contact:  Good  Speech:  Clear and Coherent, Normal Rate, Talkative, and giggly, which is her normal.  Volume:  Normal  Mood:  Euthymic  Affect:  Congruent  Thought Process:  Goal Directed and Descriptions of Associations: Circumstantial  Orientation:  Full (Time, Place, and Person)  Thought Content: Logical   Suicidal Thoughts:  No  Homicidal Thoughts:  No  Memory:  WNL  Judgement:  Good  Insight:  Good  Psychomotor Activity:  Normal  Concentration:  Concentration: Good and Attention Span: Good  Recall:  Good  Fund of Knowledge: Good  Language: Good  Assets:  Desire for Improvement Financial Resources/Insurance Housing Transportation  ADL's:  Intact  Cognition: WNL  Prognosis:  Good   Labs 07/08/2021 Lithium level 0.5 BMP kidney functions normal glucose was 187 TSH 1.8  08/01/2021, 09/05/2021 See results on chart  DIAGNOSES:    ICD-10-CM   1. Bipolar I disorder (HCC)  F31.9 Lithium level    TSH    Basic metabolic panel    2. Encounter for long-term (current) use of medications  Z79.899 Lithium level    TSH    Basic metabolic panel    3. 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  Tramadol 08/18/2021. I provided 30 minutes of face to face time during this encounter, including time spent before and after the visit in records review, medical decision making,  counseling pertinent to today's visit, and charting.   Doing well as far as her mental health goes so no changes will be made.  Continue Synthroid 125 micrograms daily. Continue lithium 900 mg/day. Continue gabapentin 300 mg,  2 po q am, 1 mid-day, 2 po qhs.  Per another provider.  Labs ordered as above. Return in 3 months.  Donnal Moat, PA-C

## 2021-11-01 ENCOUNTER — Encounter: Payer: Self-pay | Admitting: Family Medicine

## 2021-11-04 ENCOUNTER — Other Ambulatory Visit: Payer: Self-pay | Admitting: Family Medicine

## 2021-11-04 MED ORDER — TRULICITY 1.5 MG/0.5ML ~~LOC~~ SOAJ: 1.5000 mg | SUBCUTANEOUS | 0 refills | Status: DC

## 2021-11-05 ENCOUNTER — Ambulatory Visit: Payer: Commercial Managed Care - HMO | Admitting: Physical Therapy

## 2021-11-05 ENCOUNTER — Encounter: Payer: Self-pay | Admitting: Physical Therapy

## 2021-11-05 DIAGNOSIS — M5431 Sciatica, right side: Secondary | ICD-10-CM

## 2021-11-05 DIAGNOSIS — R262 Difficulty in walking, not elsewhere classified: Secondary | ICD-10-CM | POA: Diagnosis not present

## 2021-11-05 DIAGNOSIS — M5432 Sciatica, left side: Secondary | ICD-10-CM

## 2021-11-05 DIAGNOSIS — M6281 Muscle weakness (generalized): Secondary | ICD-10-CM

## 2021-11-05 NOTE — Therapy (Signed)
OUTPATIENT PHYSICAL THERAPY TREATMENT   Patient Name: Leah Powers MRN: [id number] DOB:08-Aug-1960, 61 y.o., female Today's Date: 11/05/2021   PT End of Session - 11/05/21 1015     Visit Number 11    Number of Visits 20    Date for PT Re-Evaluation 11/25/21    Authorization Type cigna,  recert done at visit 2 ;    PT Start Time 1016    PT Stop Time 1100    PT Time Calculation (min) 44 min    Activity Tolerance Patient tolerated treatment well    Behavior During Therapy [facility] for tasks assessed/performed                Past Medical History:  Diagnosis Date   Allergy    Arthritis    L hand, R ankle  (10/08/2016)   Bipolar 1 disorder ([place])    Depression    Diabetes mellitus without complication ([place])    Fibromyalgia    told that the breakout on the upper back , could be over active nerves , also treated with cream & Gabapentin- [facility] [place]    History of kidney stones    spontaneous passing   Hypertension    Hypothyroid    Pneumonia ~ 1967; 07/2016   Pyelonephritis 1997   treated with IV antibiotic    Past Surgical History:  Procedure Laterality Date   BLADDER INSTILLATION  X 2   CHALAZION EXCISION Bilateral    R- eye X2 , L eye- X1   FOOT ARTHRODESIS Right 10/08/2016   Procedure: RIGHT TRIPLE ARTHRODESIS;  Surgeon: Newt Minion, MD;  Location: [facility];  Service: Orthopedics;  Laterality: Right;   FOOT ARTHRODESIS, TRIPLE Right 10/08/2016   ankle   FRENULECTOMY, LINGUAL  1970s   LAPAROSCOPIC CHOLECYSTECTOMY  1996   PALATE SURGERY  1970s   tissue removed from upper palate as a child    URETHRAL DILATION     Patient Active Problem List   Diagnosis Date Noted   Nausea vomiting and diarrhea 05/20/2018   AKI (acute kidney injury) ([place]) 05/20/2018   Lithium toxicity 05/20/2018   Type 2 diabetes mellitus without complication, without long-term current use of insulin ([place]) 05/20/2018   Bipolar I disorder ([place]) 02/04/2018   S/P ankle fusion  11/12/2016   Posterior tibial tendon dysfunction (PTTD) of right lower extremity 10/08/2016   HTN (hypertension) 05/31/2014   Hypothyroidism 05/31/2014    PCP: Tawnya Crook, MD   REFERRING PROVIDER: Tawnya Crook, MD   REFERRING DIAG: [phone] (ICD-10-CM) - Bilateral sciatica  Rationale for Evaluation and Treatment Rehabilitation  THERAPY DIAG:  Difficulty in walking, not elsewhere classified  Muscle weakness (generalized)  Sciatica, left side  Sciatica, right side  ONSET DATE: chronic, right leg over the last month  SUBJECTIVE:  SUBJECTIVE STATEMENT: Pt was able to grocery shop independently, with a cart for 1st time in several months. She was previously using motorized cart. She had only mild pain in L rib region after, but was able to stretch and relieve.   States she had a fall back in 37' and she has always had issues in the left hip. States it has intensified more since more as of late. States that the weather makes her feels terrible. She has been on prednisone and it helped a little bit. Reports that the pain has increased and is now going down her right leg and has gotten worse over the last month. States she has a 4 prong cane that she uses when she is in a lot of pain. She also has pain in her feet as she has corns and is waiting to have surgery to help with that. States that she has used her massager with minimal relief. Wants to learn what she can do for the pain.  PERTINENT HISTORY:  hx r ankle surgery, fibromyalgia, DB, bipolar, depression  PAIN:  Are you having pain? Yes: NPRS scale: 4/10 Pain location: left leg, low back  Pain description: dull achy Aggravating factors: sit to stand, standing, laying left side Relieving factors: rest   PRECAUTIONS:  None  WEIGHT BEARING RESTRICTIONS No  FALLS:  Has patient fallen in last 6 months? No  LIVING ENVIRONMENT: Lives with: lives with their family Has following equipment at home: quad cane  OCCUPATION: not currently working  PLOF: Independent   PATIENT GOALS learn ways to reduce pressure and pain in back. Improve static postures/posiitoning   OBJECTIVE:   DIAGNOSTIC FINDINGS:  No recent images   SCREENING FOR RED FLAGS: Bowel or bladder incontinence: No Spinal tumors: No Cauda equina syndrome: No Compression fracture: No Abdominal aneurysm: No  COGNITION:  Overall cognitive status: Within functional limits for tasks assessed      POSTURE/Swelling:  hyper extends bilateral knees, increased B swelling - wearing compression stocking, forward head  PALPATION:  tenderness in legs due to swelling R>L, increased tension in lumbar paraspinals -   LUMBAR ROM:   Active  A/PROM  eval  Flexion 50% limited  Extension 100% limited  Right lateral flexion 25% limited  Left lateral flexion 75% limited and painful  Right rotation   Left rotation    (Blank rows = not tested)     LE Measurements Lower Extremity Right 09/30/2021 Left 09/30/2021   A/PROM MMT A/PROM MMT  Hip Flexion WFL 4 WFL 4  Hip Extension      Hip Abduction  4-  3+*  Hip Adduction      Hip Internal rotation 30*  50   Hip External rotation 65  65   Knee Flexion WFL 4  4  Knee Extension Hyperextends  4+ Hyperextends  4+  Ankle Dorsiflexion  4*  4*  Ankle Plantarflexion      Ankle Inversion      Ankle Eversion       (Blank rows = not tested)  * pain  Used towel for MMT   LUMBAR SPECIAL TESTS:  SLR neg B, slump test neg B   FUNCTIONAL TESTS:  bed rolling painful and slow, STS uses arms slow and painful   TODAY'S TREATMENT  11/05/21: Therapeutic Exercise: Aerobic: Supine:   Bridging  2 x 10;   Seated:  Standing: Scaption and abd  full AROM x 10 bil;   Rows Gtb x 20;  Low Row/alternating GTB  x  15;  Toe taps x 25 6 in step, no UE support,  step ups fwd 6 in x 10 bil;  Stretches:   Standing Side bending/QL stretch 30 sec x 3 bil Gait:  Neuromuscular Re-education: Manual Therapy:     PATIENT EDUCATION:  Education details:  reviewed HEP Person educated: Patient Education method: Explanation, Media planner, and Handouts Education comprehension: verbalized understanding  HOME EXERCISE PROGRAM: [id number]   ASSESSMENT:  CLINICAL IMPRESSION: Pt obtained appt with podiatry for orthotics. She is doing well with tolerance and ability for ther ex. Will progress strength and finalize HEP in next week or so, will benefit from continued strengthening for core, and LEs, for improving mobility. She is progressing well, with reported improvements of stair climbing ability, and ability to walk in store and get groceries, which she was previously unable to do.    OBJECTIVE IMPAIRMENTS decreased activity tolerance, decreased mobility, difficulty walking, decreased ROM, decreased strength, increased edema, improper body mechanics, postural dysfunction, and pain.   ACTIVITY LIMITATIONS sitting, standing, squatting, transfers, and bed mobility  PARTICIPATION LIMITATIONS: meal prep, cleaning, shopping, and community activity  PERSONAL FACTORS Age, Fitness, and 1-2 comorbidities: B corn pain, R ankle surgery history    REHAB POTENTIAL: Good  CLINICAL DECISION MAKING: Stable/uncomplicated  EVALUATION COMPLEXITY: Low   GOALS: Goals reviewed with patient?  yes  SHORT TERM GOALS:  Patient will be independent in self management strategies to improve quality of life and functional outcomes. Baseline: new program Target date: 10/14/2021 Goal status: INITIAL  2.  Patient will report at least 50% improvement in overall symptoms and/or function to demonstrate improved functional mobility Baseline: 0% Target date:10/14/2021 Goal status: INITIAL  3.  Patient will be able to perform pain free  bed mobilities Baseline: painful Target date: 10/14/21 Goal status: INITIAL      LONG TERM GOALS:  Patient will report at least 75% improvement in overall symptoms and/or function to demonstrate improved functional mobility Baseline: 0% Target date: 9/11//2023 Goal status: INITIAL  2.  Patient will be able to report managing right leg pain with HEP. Baseline: new program Target date: 11/25/2021 Goal status: INITIAL  3.  Patient will be able to demonstrate painfree lumbar ROM Baseline: painful Target date: 11/25/2021 Goal status: INITIAL  4. Pt to demo improved hip strength to at least 4/5 to improve stability, pain, and gait.  Target date: 11/25/2021 Goal status: INITIAL/added 09/30/21     PLAN: PT FREQUENCY: 2x/week  PT DURATION: 8 weeks  PLANNED INTERVENTIONS: Therapeutic exercises, Therapeutic activity, Neuromuscular re-education, Balance training, Gait training, Patient/Family education, Joint mobilization, Aquatic Therapy, Dry Needling, Electrical stimulation, Spinal mobilization, Cryotherapy, Moist heat, Ultrasound, Ionotophoresis '4mg'$ /ml Dexamethasone, and Manual therapy.  PLAN FOR NEXT SESSION: hip/backmanul, LE, core strength, gait mechanics.   Lyndee Hensen, PT, DPT 12:04 PM  11/05/21

## 2021-11-07 ENCOUNTER — Encounter: Payer: Self-pay | Admitting: Physical Therapy

## 2021-11-07 ENCOUNTER — Ambulatory Visit: Payer: Commercial Managed Care - HMO | Admitting: Physical Therapy

## 2021-11-07 DIAGNOSIS — M5432 Sciatica, left side: Secondary | ICD-10-CM | POA: Diagnosis not present

## 2021-11-07 DIAGNOSIS — M6281 Muscle weakness (generalized): Secondary | ICD-10-CM | POA: Diagnosis not present

## 2021-11-07 DIAGNOSIS — M5431 Sciatica, right side: Secondary | ICD-10-CM | POA: Diagnosis not present

## 2021-11-07 DIAGNOSIS — R262 Difficulty in walking, not elsewhere classified: Secondary | ICD-10-CM | POA: Diagnosis not present

## 2021-11-07 NOTE — Therapy (Signed)
OUTPATIENT PHYSICAL THERAPY TREATMENT   Patient Name: SUANN KLIER MRN: [id number] DOB:1960-10-14, 61 y.o., female Today's Date: 11/07/2021   PT End of Session - 11/07/21 1116     Visit Number 12    Number of Visits 20    Date for PT Re-Evaluation 11/25/21    Authorization Type cigna,  recert done at visit 2 ;    PT Start Time 1018    PT Stop Time 1100    PT Time Calculation (min) 42 min    Activity Tolerance Patient tolerated treatment well    Behavior During Therapy [facility] for tasks assessed/performed                 Past Medical History:  Diagnosis Date   Allergy    Arthritis    L hand, R ankle  (10/08/2016)   Bipolar 1 disorder ([place])    Depression    Diabetes mellitus without complication ([place])    Fibromyalgia    told that the breakout on the upper back , could be over active nerves , also treated with cream & Gabapentin- [facility] [place]    History of kidney stones    spontaneous passing   Hypertension    Hypothyroid    Pneumonia ~ 1967; 07/2016   Pyelonephritis 1997   treated with IV antibiotic    Past Surgical History:  Procedure Laterality Date   BLADDER INSTILLATION  X 2   CHALAZION EXCISION Bilateral    R- eye X2 , L eye- X1   FOOT ARTHRODESIS Right 10/08/2016   Procedure: RIGHT TRIPLE ARTHRODESIS;  Surgeon: Newt Minion, MD;  Location: [facility];  Service: Orthopedics;  Laterality: Right;   FOOT ARTHRODESIS, TRIPLE Right 10/08/2016   ankle   FRENULECTOMY, LINGUAL  1970s   LAPAROSCOPIC CHOLECYSTECTOMY  1996   PALATE SURGERY  1970s   tissue removed from upper palate as a child    URETHRAL DILATION     Patient Active Problem List   Diagnosis Date Noted   Nausea vomiting and diarrhea 05/20/2018   AKI (acute kidney injury) ([place]) 05/20/2018   Lithium toxicity 05/20/2018   Type 2 diabetes mellitus without complication, without long-term current use of insulin ([place]) 05/20/2018   Bipolar I disorder ([place]) 02/04/2018   S/P ankle fusion  11/12/2016   Posterior tibial tendon dysfunction (PTTD) of right lower extremity 10/08/2016   HTN (hypertension) 05/31/2014   Hypothyroidism 05/31/2014    PCP: Tawnya Crook, MD   REFERRING PROVIDER: Tawnya Crook, MD   REFERRING DIAG: [phone] (ICD-10-CM) - Bilateral sciatica  Rationale for Evaluation and Treatment Rehabilitation  THERAPY DIAG:  Difficulty in walking, not elsewhere classified  Muscle weakness (generalized)  Sciatica, left side  Sciatica, right side  ONSET DATE: chronic, right leg over the last month  SUBJECTIVE:  SUBJECTIVE STATEMENT: Pt reports doing well. Did have start of pain in L torso, but went away within 30 min.   States she had a fall back in 36' and she has always had issues in the left hip. States it has intensified more since more as of late. States that the weather makes her feels terrible. She has been on prednisone and it helped a little bit. Reports that the pain has increased and is now going down her right leg and has gotten worse over the last month. States she has a 4 prong cane that she uses when she is in a lot of pain. She also has pain in her feet as she has corns and is waiting to have surgery to help with that. States that she has used her massager with minimal relief. Wants to learn what she can do for the pain.  PERTINENT HISTORY:  hx r ankle surgery, fibromyalgia, DB, bipolar, depression  PAIN:  Are you having pain? Yes: NPRS scale: 4/10 Pain location: left leg, low back  Pain description: dull achy Aggravating factors: sit to stand, standing, laying left side Relieving factors: rest   PRECAUTIONS: None  WEIGHT BEARING RESTRICTIONS No  FALLS:  Has patient fallen in last 6 months? No  LIVING ENVIRONMENT: Lives with: lives with  their family Has following equipment at home: quad cane  OCCUPATION: not currently working  PLOF: Independent   PATIENT GOALS learn ways to reduce pressure and pain in back. Improve static postures/posiitoning   OBJECTIVE:   DIAGNOSTIC FINDINGS:  No recent images   SCREENING FOR RED FLAGS: Bowel or bladder incontinence: No Spinal tumors: No Cauda equina syndrome: No Compression fracture: No Abdominal aneurysm: No  COGNITION:  Overall cognitive status: Within functional limits for tasks assessed      POSTURE/Swelling:  hyper extends bilateral knees, increased B swelling - wearing compression stocking, forward head  PALPATION:  tenderness in legs due to swelling R>L, increased tension in lumbar paraspinals -   LUMBAR ROM:   Active  A/PROM  eval  Flexion 50% limited  Extension 100% limited  Right lateral flexion 25% limited  Left lateral flexion 75% limited and painful  Right rotation   Left rotation    (Blank rows = not tested)     LE Measurements Lower Extremity Right 09/30/2021 Left 09/30/2021   A/PROM MMT A/PROM MMT  Hip Flexion WFL 4 WFL 4  Hip Extension      Hip Abduction  4-  3+*  Hip Adduction      Hip Internal rotation 30*  50   Hip External rotation 65  65   Knee Flexion WFL 4  4  Knee Extension Hyperextends  4+ Hyperextends  4+  Ankle Dorsiflexion  4*  4*  Ankle Plantarflexion      Ankle Inversion      Ankle Eversion       (Blank rows = not tested)  * pain  Used towel for MMT   LUMBAR SPECIAL TESTS:  SLR neg B, slump test neg B   FUNCTIONAL TESTS:  bed rolling painful and slow, STS uses arms slow and painful   TODAY'S TREATMENT   11/07/21: Therapeutic Exercise: Aerobic: Supine:   Bridging  2 x 10;   S/L hip abd 2x10 bil;  Seated: Heel raises x 15;  Standing: Attempt for standing HR x 10;  Scaption and abd  full AROM x 10 bil;   Rows Gtb x 20; Shoulder ER Ytb x 15;  Scap  squeeze x 10; Hip abd 2x10 bil; Slow March x 20;  Stretches:    Standing Side bending/QL stretch 30 sec x 3 bil Gait:  Neuromuscular Re-education: Manual Therapy:     PATIENT EDUCATION:  Education details:  updated and reviewed HEP Person educated: Patient Education method: Explanation, Demonstration, and Handouts Education comprehension: verbalized understanding  HOME EXERCISE PROGRAM: [id number]   ASSESSMENT:  CLINICAL IMPRESSION: Pt progressing well. Updated HEP today. She has improving ability for core and hip strengthening. Continued UE/postural mobility and core strength to continue to decrease pain onset on L. She has had much decreased length of pain in this region in last couple weeks. 30 min or less, vs several hours.   OBJECTIVE IMPAIRMENTS decreased activity tolerance, decreased mobility, difficulty walking, decreased ROM, decreased strength, increased edema, improper body mechanics, postural dysfunction, and pain.   ACTIVITY LIMITATIONS sitting, standing, squatting, transfers, and bed mobility  PARTICIPATION LIMITATIONS: meal prep, cleaning, shopping, and community activity  PERSONAL FACTORS Age, Fitness, and 1-2 comorbidities: B corn pain, R ankle surgery history    REHAB POTENTIAL: Good  CLINICAL DECISION MAKING: Stable/uncomplicated  EVALUATION COMPLEXITY: Low   GOALS: Goals reviewed with patient?  yes  SHORT TERM GOALS:  Patient will be independent in self management strategies to improve quality of life and functional outcomes. Baseline: new program Target date: 10/14/2021 Goal status: INITIAL  2.  Patient will report at least 50% improvement in overall symptoms and/or function to demonstrate improved functional mobility Baseline: 0% Target date:10/14/2021 Goal status: INITIAL  3.  Patient will be able to perform pain free bed mobilities Baseline: painful Target date: 10/14/21 Goal status: INITIAL      LONG TERM GOALS:  Patient will report at least 75% improvement in overall symptoms and/or function to  demonstrate improved functional mobility Baseline: 0% Target date: 9/11//2023 Goal status: INITIAL  2.  Patient will be able to report managing right leg pain with HEP. Baseline: new program Target date: 11/25/2021 Goal status: INITIAL  3.  Patient will be able to demonstrate painfree lumbar ROM Baseline: painful Target date: 11/25/2021 Goal status: INITIAL  4. Pt to demo improved hip strength to at least 4/5 to improve stability, pain, and gait.  Target date: 11/25/2021 Goal status: INITIAL/added 09/30/21     PLAN: PT FREQUENCY: 2x/week  PT DURATION: 8 weeks  PLANNED INTERVENTIONS: Therapeutic exercises, Therapeutic activity, Neuromuscular re-education, Balance training, Gait training, Patient/Family education, Joint mobilization, Aquatic Therapy, Dry Needling, Electrical stimulation, Spinal mobilization, Cryotherapy, Moist heat, Ultrasound, Ionotophoresis '4mg'$ /ml Dexamethasone, and Manual therapy.  PLAN FOR NEXT SESSION: hip/back manul, LE, core strength, gait mechanics.   Lyndee Hensen, PT, DPT 11:17 AM  11/07/21

## 2021-11-09 LAB — BASIC METABOLIC PANEL
BUN/Creatinine Ratio: 27 (ref 12–28)
BUN: 20 mg/dL (ref 8–27)
CO2: 24 mmol/L (ref 20–29)
Calcium: 9.2 mg/dL (ref 8.7–10.3)
Chloride: 109 mmol/L — ABNORMAL HIGH (ref 96–106)
Creatinine, Ser: 0.75 mg/dL (ref 0.57–1.00)
Glucose: 128 mg/dL — ABNORMAL HIGH (ref 70–99)
Potassium: 4.3 mmol/L (ref 3.5–5.2)
Sodium: 146 mmol/L — ABNORMAL HIGH (ref 134–144)
eGFR: 91 mL/min/{1.73_m2} (ref >59)

## 2021-11-09 LAB — TSH: TSH: 5.41 u[IU]/mL — ABNORMAL HIGH (ref 0.450–4.500)

## 2021-11-10 LAB — LITHIUM LEVEL: Lithium Lvl: 0.5 mmol/L (ref 0.5–1.2)

## 2021-11-11 NOTE — Progress Notes (Signed)
Hi Dr. Cherlynn [facility], these are Leah Powers's routine labs.  Do you want me to address the elevated TSH or do you treat that?  I think the last prescription was given by you so I do not want to make changes without her knowledge. From a psych standpoint she is doing well. Thank you for your help.

## 2021-11-12 ENCOUNTER — Telehealth: Payer: Self-pay | Admitting: Family Medicine

## 2021-11-12 ENCOUNTER — Ambulatory Visit: Payer: Commercial Managed Care - HMO | Admitting: Physical Therapy

## 2021-11-12 DIAGNOSIS — M5432 Sciatica, left side: Secondary | ICD-10-CM

## 2021-11-12 DIAGNOSIS — M5431 Sciatica, right side: Secondary | ICD-10-CM | POA: Diagnosis not present

## 2021-11-12 DIAGNOSIS — M6281 Muscle weakness (generalized): Secondary | ICD-10-CM

## 2021-11-12 DIAGNOSIS — R262 Difficulty in walking, not elsewhere classified: Secondary | ICD-10-CM

## 2021-11-12 NOTE — Therapy (Signed)
OUTPATIENT PHYSICAL THERAPY TREATMENT   Patient Name: Leah Powers MRN: [id number] DOB:Apr 28, 1960, 61 y.o., female Today's Date: 11/12/2021   PT End of Session - 11/15/21 0925     Visit Number 13    Number of Visits 20    Date for PT Re-Evaluation 11/25/21    Authorization Type cigna,  recert done at visit 2 ;    PT Start Time 1104    PT Stop Time 1144    PT Time Calculation (min) 40 min    Activity Tolerance Patient tolerated treatment well    Behavior During Therapy [facility] for tasks assessed/performed                  Past Medical History:  Diagnosis Date   Allergy    Arthritis    L hand, R ankle  (10/08/2016)   Bipolar 1 disorder ([place])    Depression    Diabetes mellitus without complication ([place])    Fibromyalgia    told that the breakout on the upper back , could be over active nerves , also treated with cream & Gabapentin- [facility] [place]    History of kidney stones    spontaneous passing   Hypertension    Hypothyroid    Pneumonia ~ 1967; 07/2016   Pyelonephritis 1997   treated with IV antibiotic    Past Surgical History:  Procedure Laterality Date   BLADDER INSTILLATION  X 2   CHALAZION EXCISION Bilateral    R- eye X2 , L eye- X1   FOOT ARTHRODESIS Right 10/08/2016   Procedure: RIGHT TRIPLE ARTHRODESIS;  Surgeon: Newt Minion, MD;  Location: [facility];  Service: Orthopedics;  Laterality: Right;   FOOT ARTHRODESIS, TRIPLE Right 10/08/2016   ankle   FRENULECTOMY, LINGUAL  1970s   LAPAROSCOPIC CHOLECYSTECTOMY  1996   PALATE SURGERY  1970s   tissue removed from upper palate as a child    URETHRAL DILATION     Patient Active Problem List   Diagnosis Date Noted   Nausea vomiting and diarrhea 05/20/2018   AKI (acute kidney injury) ([place]) 05/20/2018   Lithium toxicity 05/20/2018   Type 2 diabetes mellitus without complication, without long-term current use of insulin ([place]) 05/20/2018   Bipolar I disorder ([place]) 02/04/2018   S/P ankle fusion  11/12/2016   Posterior tibial tendon dysfunction (PTTD) of right lower extremity 10/08/2016   HTN (hypertension) 05/31/2014   Hypothyroidism 05/31/2014    PCP: Tawnya Crook, MD   REFERRING PROVIDER: Tawnya Crook, MD   REFERRING DIAG: [phone] (ICD-10-CM) - Bilateral sciatica  Rationale for Evaluation and Treatment Rehabilitation  THERAPY DIAG:  Difficulty in walking, not elsewhere classified  Muscle weakness (generalized)  Sciatica, left side  Sciatica, right side  ONSET DATE: chronic, right leg over the last month  SUBJECTIVE:  SUBJECTIVE STATEMENT: Pt reports doing well. Has had minimal pain this week and last. Has some mild soreness in L low back today .  States she had a fall back in 69' and she has always had issues in the left hip. States it has intensified more since more as of late. States that the weather makes her feels terrible. She has been on prednisone and it helped a little bit. Reports that the pain has increased and is now going down her right leg and has gotten worse over the last month. States she has a 4 prong cane that she uses when she is in a lot of pain. She also has pain in her feet as she has corns and is waiting to have surgery to help with that. States that she has used her massager with minimal relief. Wants to learn what she can do for the pain.  PERTINENT HISTORY:  hx r ankle surgery, fibromyalgia, DB, bipolar, depression  PAIN:  Are you having pain? Yes: NPRS scale: 4/10 Pain location: left leg, low back  Pain description: dull achy Aggravating factors: sit to stand, standing, laying left side Relieving factors: rest   PRECAUTIONS: None  WEIGHT BEARING RESTRICTIONS No  FALLS:  Has patient fallen in last 6 months? No  LIVING  ENVIRONMENT: Lives with: lives with their family Has following equipment at home: quad cane  OCCUPATION: not currently working  PLOF: Independent   PATIENT GOALS learn ways to reduce pressure and pain in back. Improve static postures/posiitoning   OBJECTIVE:   DIAGNOSTIC FINDINGS:  No recent images   SCREENING FOR RED FLAGS: Bowel or bladder incontinence: No Spinal tumors: No Cauda equina syndrome: No Compression fracture: No Abdominal aneurysm: No  COGNITION:  Overall cognitive status: Within functional limits for tasks assessed      POSTURE/Swelling:  hyper extends bilateral knees, increased B swelling - wearing compression stocking, forward head  PALPATION:  tenderness in legs due to swelling R>L, increased tension in lumbar paraspinals -   LUMBAR ROM:   Active  A/PROM  eval 11/12/21  Flexion 50% limited WFL  Extension 100% limited WFL  Right lateral flexion 25% limited WFL  Left lateral flexion 75% limited and painful WFl  Right rotation    Left rotation     (Blank rows = not tested)     LE Measurements Lower Extremity Right 09/30/2021 Left 09/30/2021   A/PROM MMT A/PROM MMT  Hip Flexion WFL 4 WFL 4  Hip Extension      Hip Abduction  4-  3+*  Hip Adduction      Hip Internal rotation 30*  50   Hip External rotation 65  65   Knee Flexion WFL 4  4  Knee Extension Hyperextends  4+ Hyperextends  4+  Ankle Dorsiflexion  4*  4*  Ankle Plantarflexion      Ankle Inversion      Ankle Eversion       (Blank rows = not tested)  * pain  11/12/21:  Hip strength: 4/5,  Knees: 5/5,  Ankle: 4/5 (pf 4-/5)     TODAY'S TREATMENT   11/12/21: Therapeutic Exercise: Aerobic: Supine:   Bridging  2 x 10;   S/L hip abd 2x10 bil;  Seated:  Standing:   [place]  full AROM x 10 bil;   Rows Blue TB x 20; Shoulder ER Red TB x 15;  Hip abd 2x10 bil; Slow March x 20;  Step ups x 10 bil; Stairs updown 5 with  1 HR x 5;  Stretches: Seated piriformis x 2 min;  Standing Side  bending/QL stretch 30 sec x 3 bil Gait:  Neuromuscular Re-education: Manual Therapy:      PATIENT EDUCATION:  Education details:  updated and reviewed HEP Person educated: Patient Education method: Explanation, Demonstration, and Handouts Education comprehension: verbalized understanding  HOME EXERCISE PROGRAM: [id number]   ASSESSMENT:  CLINICAL IMPRESSION: Pt has made very good progress. Goals have been met at this time. She has much improved mobility and ability for functional activity. She is no longer using cane. She is doing better with stairs, walking pattern, and walking endurance. She has improved strength of hips and core, and is doing well with HEP. She has had much improvement of pain in hips and back. She has mild pain from time to time, and has been able to manage in last couple weeks with stretches and HEP. Plan to d/c, pt in agreement with plan. Updated and reviewed final HEP for strength and mobiity.   OBJECTIVE IMPAIRMENTS decreased activity tolerance, decreased mobility, difficulty walking, decreased ROM, decreased strength, increased edema, improper body mechanics, postural dysfunction, and pain.   ACTIVITY LIMITATIONS sitting, standing, squatting, transfers, and bed mobility  PARTICIPATION LIMITATIONS: meal prep, cleaning, shopping, and community activity  PERSONAL FACTORS Age, Fitness, and 1-2 comorbidities: B corn pain, R ankle surgery history    REHAB POTENTIAL: Good  CLINICAL DECISION MAKING: Stable/uncomplicated  EVALUATION COMPLEXITY: Low   GOALS: Goals reviewed with patient?  yes  SHORT TERM GOALS:  Patient will be independent in self management strategies to improve quality of life and functional outcomes. Baseline: new program Target date: 10/14/2021 Goal status: MET  2.  Patient will report at least 50% improvement in overall symptoms and/or function to demonstrate improved functional mobility Baseline: 0% Target date:10/14/2021 Goal  status: IMET  3.  Patient will be able to perform pain free bed mobilities Baseline: painful Target date: 10/14/21 Goal status: MET    LONG TERM GOALS:  Patient will report at least 75% improvement in overall symptoms and/or function to demonstrate improved functional mobility Baseline: 0% Target date: 9/11//2023 Goal status:MET  2.  Patient will be able to report managing right leg pain with HEP. Baseline: new program Target date: 11/25/2021 Goal status: MET  3.  Patient will be able to demonstrate painfree lumbar ROM Baseline: painful Target date: 11/25/2021 Goal status: MET  4. Pt to demo improved hip strength to at least 4/5 to improve stability, pain, and gait.  Target date: 11/25/2021 Goal status: MET     PLAN: PT FREQUENCY: 2x/week  PT DURATION: 8 weeks  PLANNED INTERVENTIONS: Therapeutic exercises, Therapeutic activity, Neuromuscular re-education, Balance training, Gait training, Patient/Family education, Joint mobilization, Aquatic Therapy, Dry Needling, Electrical stimulation, Spinal mobilization, Cryotherapy, Moist heat, Ultrasound, Ionotophoresis 4mg /ml Dexamethasone, and Manual therapy.  PLAN FOR NEXT SESSION:  Lyndee Hensen, PT, DPT 9:27 AM  11/15/21    PHYSICAL THERAPY DISCHARGE SUMMARY  Visits from Start of Care: 13   Plan: Patient agrees to discharge.  Patient goals were met. Patient is being discharged due to meeting the stated rehab goals.     Lyndee Hensen, PT, DPT 9:32 AM  11/15/21

## 2021-11-12 NOTE — Telephone Encounter (Signed)
Patient advised that pcp has not had a chance to view labs, will give her a call back when she do. Patient verbalized understanding.

## 2021-11-12 NOTE — Telephone Encounter (Signed)
Patient requests to be called at ph# [phone] to discuss lab results -TSH changed and if medication needs to be changed  Patient states Donnal Moat at [facility] is also waiting to receive the same information.

## 2021-11-13 ENCOUNTER — Other Ambulatory Visit: Payer: Self-pay | Admitting: *Deleted

## 2021-11-13 DIAGNOSIS — Z1329 Encounter for screening for other suspected endocrine disorder: Secondary | ICD-10-CM

## 2021-11-13 DIAGNOSIS — D508 Other iron deficiency anemias: Secondary | ICD-10-CM

## 2021-11-13 DIAGNOSIS — E119 Type 2 diabetes mellitus without complications: Secondary | ICD-10-CM

## 2021-11-13 DIAGNOSIS — E785 Hyperlipidemia, unspecified: Secondary | ICD-10-CM

## 2021-11-13 MED ORDER — LEVOTHYROXINE SODIUM 150 MCG PO TABS: 150.0000 ug | ORAL_TABLET | Freq: Every day | ORAL | 3 refills | Status: DC

## 2021-11-13 NOTE — Progress Notes (Signed)
Thank you Dr. Cherlynn Kaiser. Leah Powers, please let pt know Lithium level is low normal, but b/c she's stable mentally, lets leave dose the same.  Kidney function is good. Glucose is a little high but better than several months ago.  Thank you.

## 2021-11-13 NOTE — Progress Notes (Signed)
LVM to RC 

## 2021-11-13 NOTE — Telephone Encounter (Signed)
Patient given results and recommendations and verbalized understanding.

## 2021-11-15 ENCOUNTER — Encounter: Payer: Self-pay | Admitting: Physical Therapy

## 2021-11-26 ENCOUNTER — Ambulatory Visit: Payer: 59 | Admitting: Physician Assistant

## 2021-11-27 ENCOUNTER — Ambulatory Visit: Payer: Commercial Managed Care - HMO | Admitting: Podiatry

## 2021-11-27 ENCOUNTER — Ambulatory Visit (INDEPENDENT_AMBULATORY_CARE_PROVIDER_SITE_OTHER): Payer: Commercial Managed Care - HMO

## 2021-11-27 ENCOUNTER — Other Ambulatory Visit: Payer: Commercial Managed Care - HMO

## 2021-11-27 ENCOUNTER — Encounter: Payer: Self-pay | Admitting: Podiatry

## 2021-11-27 DIAGNOSIS — E1140 Type 2 diabetes mellitus with diabetic neuropathy, unspecified: Secondary | ICD-10-CM | POA: Diagnosis not present

## 2021-11-27 DIAGNOSIS — M779 Enthesopathy, unspecified: Secondary | ICD-10-CM | POA: Diagnosis not present

## 2021-11-27 DIAGNOSIS — E1149 Type 2 diabetes mellitus with other diabetic neurological complication: Secondary | ICD-10-CM

## 2021-11-27 DIAGNOSIS — M2140 Flat foot [pes planus] (acquired), unspecified foot: Secondary | ICD-10-CM

## 2021-11-27 NOTE — Progress Notes (Signed)
Subjective:   Patient ID: Leah Powers, female   DOB: 61 y.o.   MRN: [id number]   HPI Patient presents stating she has had a lot of issues with foot structure and has collapse medial longitudinal arch left and has had triple arthrodesis right 5 years ago   Review of Systems  All other systems reviewed and are negative.       Objective:  Physical Exam Vitals and nursing note reviewed.  Constitutional:      Appearance: She is well-developed.  Pulmonary:     Effort: Pulmonary effort is normal.  Musculoskeletal:        General: Normal range of motion.  Skin:    General: Skin is warm.  Neurological:     Mental Status: She is alert.     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

## 2021-11-28 ENCOUNTER — Encounter: Payer: Commercial Managed Care - HMO | Admitting: Physical Therapy

## 2021-12-04 ENCOUNTER — Encounter: Payer: Self-pay | Admitting: Family Medicine

## 2021-12-04 IMAGING — MG MM DIGITAL SCREENING BILAT W/ TOMO AND CAD
8 series · 8 of 24 positions shown · non-contrast
Comparison: Previous exam(s).

CLINICAL DATA: Screening.

EXAM:
DIGITAL SCREENING BILATERAL MAMMOGRAM WITH TOMOSYNTHESIS AND CAD
TECHNIQUE: Bilateral screening digital craniocaudal and mediolateral oblique
mammograms were obtained. Bilateral screening digital breast
tomosynthesis was performed. The images were evaluated with
computer-aided detection.

[L MLO synth-2D]
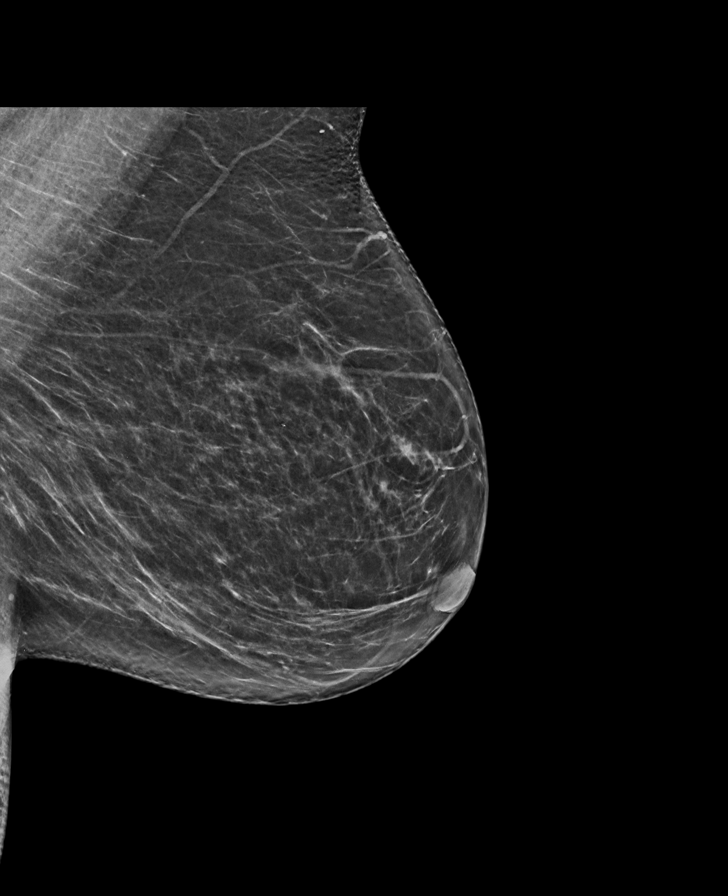

[L CC synth-2D]
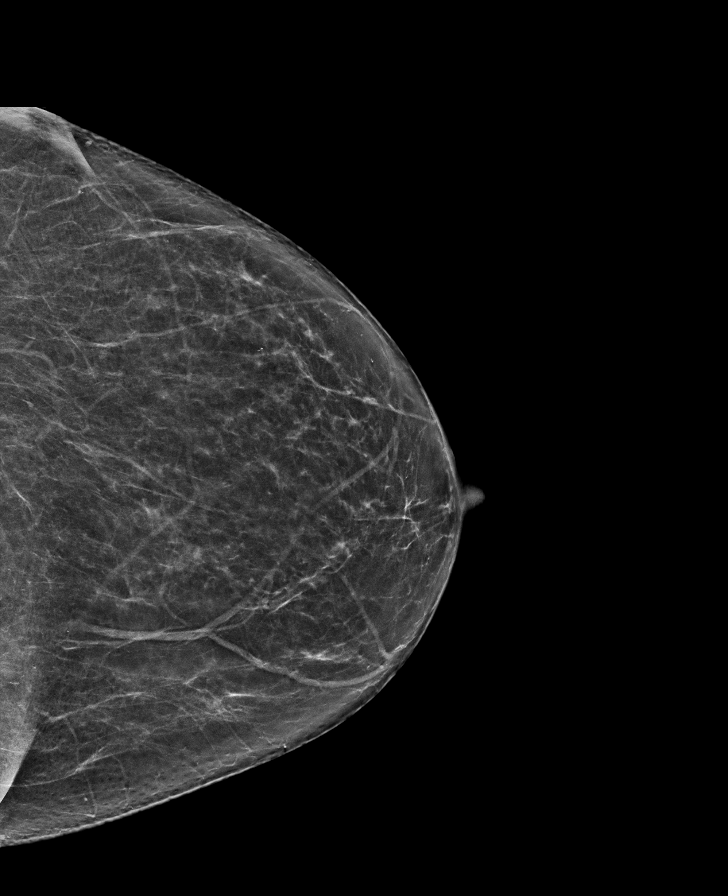

[R MLO synth-2D]
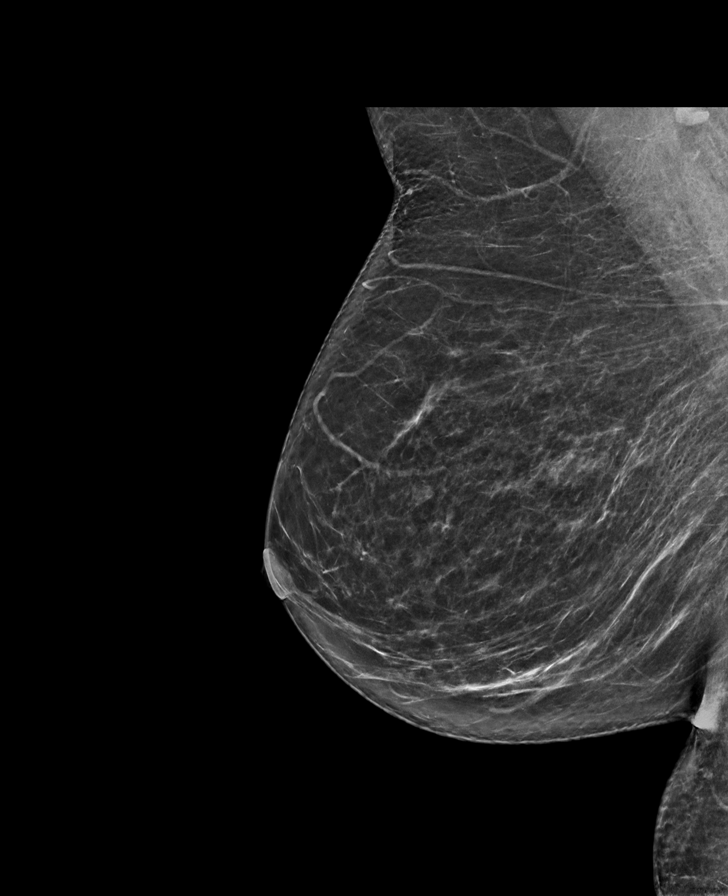

[R CC synth-2D]
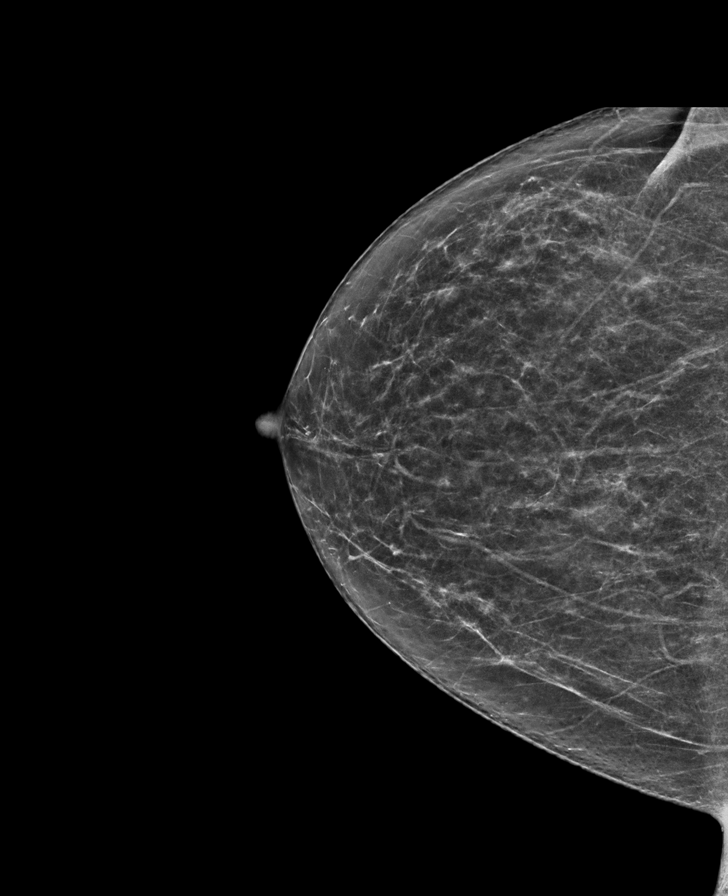

[L MLO tomo · tomo slice 39/77.0]
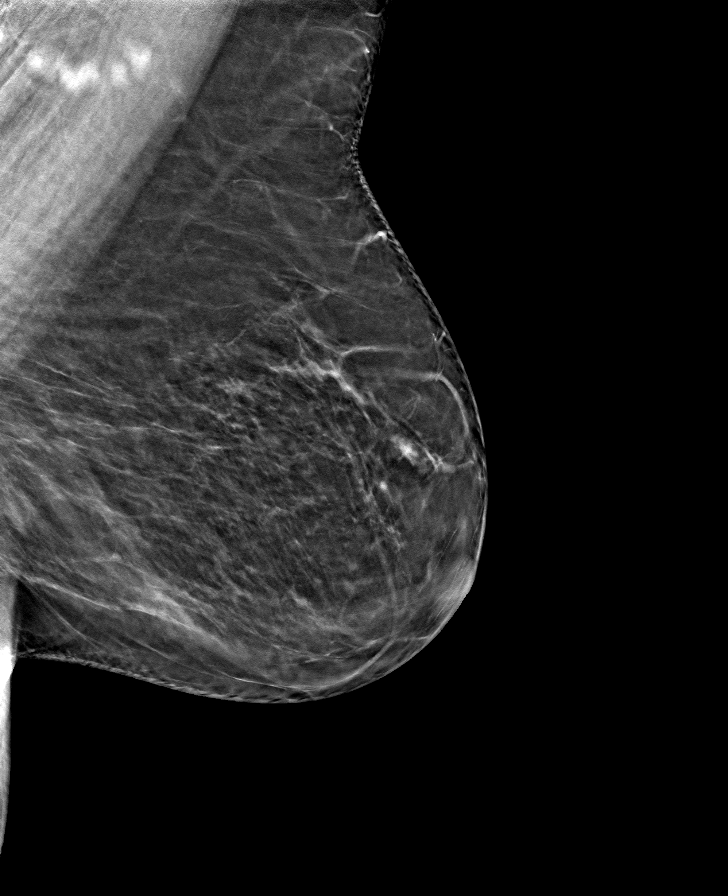

[R MLO tomo · tomo slice 39/76.0]
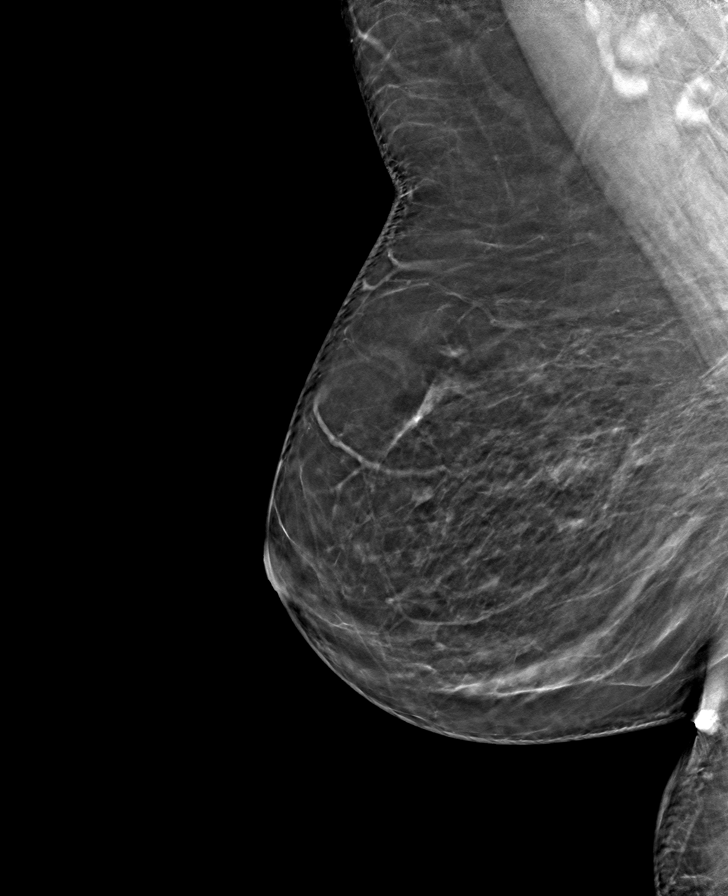

[R CC tomo · tomo slice 33/66.0]
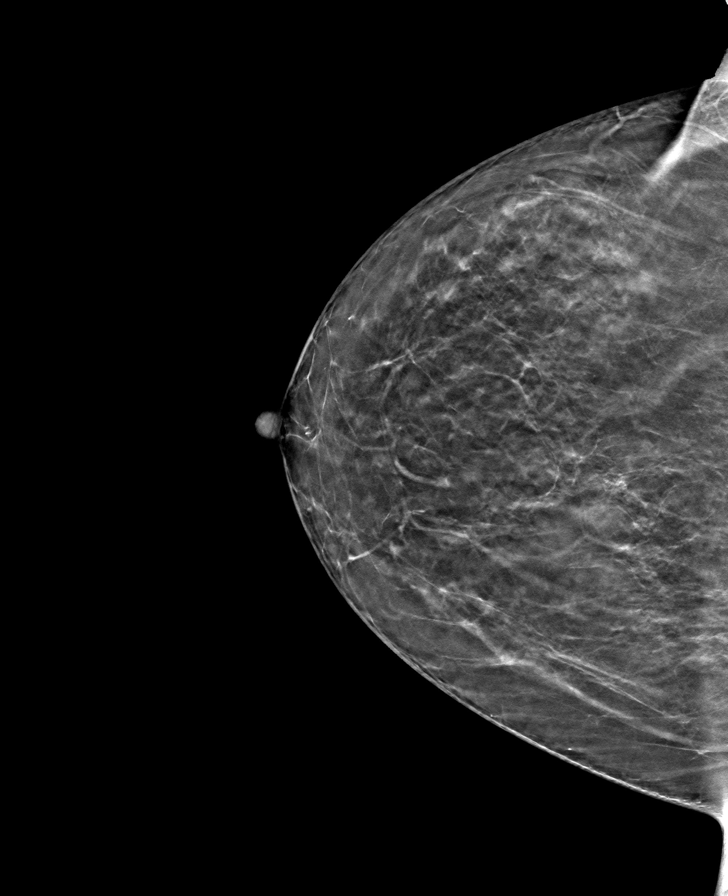

[L CC tomo · tomo slice 34/67.0]
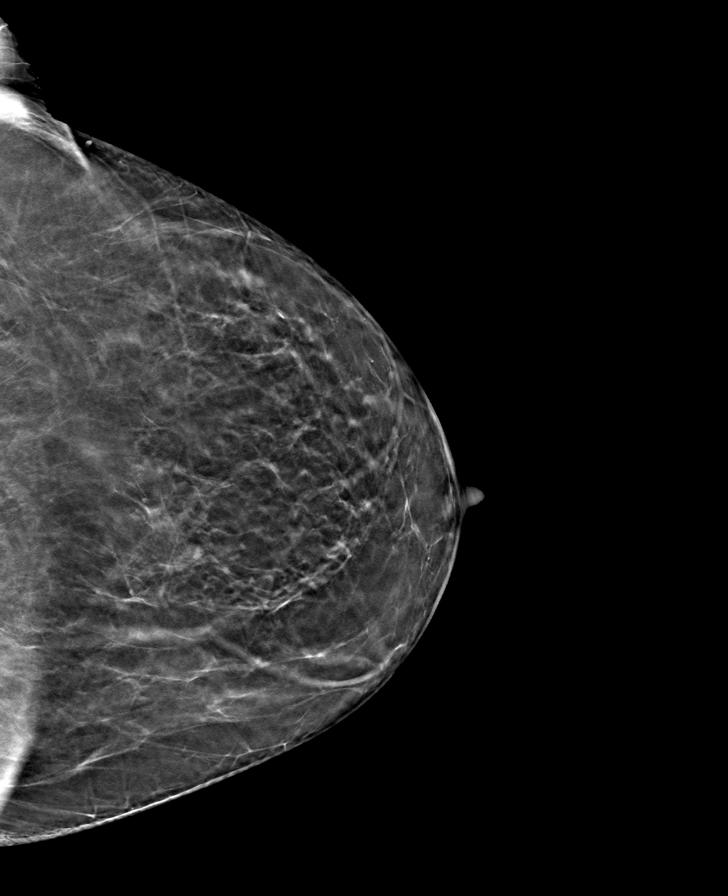

[8 of 24 positions shown; findings below may reference images not displayed]

ACR Breast Density Category b: There are scattered areas of
fibroglandular density.
FINDINGS: There are no findings suspicious for malignancy.
IMPRESSION: No mammographic evidence of malignancy. A result letter of this
screening mammogram will be mailed directly to the patient.

RECOMMENDATION:
Screening mammogram in one year. (Code:51-O-LD2)

BI-RADS CATEGORY  1: Negative.

## 2021-12-09 ENCOUNTER — Encounter: Payer: Self-pay | Admitting: Family Medicine

## 2021-12-09 ENCOUNTER — Encounter: Payer: Self-pay | Admitting: *Deleted

## 2021-12-17 ENCOUNTER — Ambulatory Visit (INDEPENDENT_AMBULATORY_CARE_PROVIDER_SITE_OTHER): Payer: Commercial Managed Care - HMO | Admitting: *Deleted

## 2021-12-17 DIAGNOSIS — M2140 Flat foot [pes planus] (acquired), unspecified foot: Secondary | ICD-10-CM

## 2021-12-17 NOTE — Progress Notes (Addendum)
Patient presents today to pick up custom molded foot orthotics, diagnosed with pes planus by Dr. Paulla Dolly.   Orthotics were dispensed and fit was NOT satisfactory.   She has severe flat foot and she is feeling her feet turn over on the medial side of the orthotics.   I recommended a medial flange to both orthotics and explained the benefits of that. She agreed to try.  Orthotics sent back to [facility] for adjustments.  Patient will follow up once they arrive back in office.

## 2021-12-17 NOTE — Addendum Note (Signed)
Addended by: Clovis Riley E on: 12/17/2021 01:58 PM   Modules accepted: Level of Service

## 2022-01-02 ENCOUNTER — Emergency Department (HOSPITAL_BASED_OUTPATIENT_CLINIC_OR_DEPARTMENT_OTHER): Payer: Commercial Managed Care - HMO

## 2022-01-02 ENCOUNTER — Encounter (HOSPITAL_BASED_OUTPATIENT_CLINIC_OR_DEPARTMENT_OTHER): Payer: Self-pay

## 2022-01-02 ENCOUNTER — Telehealth: Payer: Self-pay | Admitting: Family Medicine

## 2022-01-02 ENCOUNTER — Other Ambulatory Visit: Payer: Self-pay

## 2022-01-02 ENCOUNTER — Emergency Department (HOSPITAL_BASED_OUTPATIENT_CLINIC_OR_DEPARTMENT_OTHER)
Admission: EM | Admit: 2022-01-02 | Discharge: 2022-01-02 | Disposition: A | Discharge status: Home / Self Care | Payer: Commercial Managed Care - HMO | Attending: Emergency Medicine | Admitting: Emergency Medicine

## 2022-01-02 DIAGNOSIS — Z7984 Long term (current) use of oral hypoglycemic drugs: Secondary | ICD-10-CM | POA: Diagnosis not present

## 2022-01-02 DIAGNOSIS — N390 Urinary tract infection, site not specified: Secondary | ICD-10-CM | POA: Diagnosis not present

## 2022-01-02 DIAGNOSIS — R319 Hematuria, unspecified: Secondary | ICD-10-CM | POA: Diagnosis present

## 2022-01-02 DIAGNOSIS — I10 Essential (primary) hypertension: Secondary | ICD-10-CM | POA: Diagnosis not present

## 2022-01-02 DIAGNOSIS — Z79899 Other long term (current) drug therapy: Secondary | ICD-10-CM | POA: Diagnosis not present

## 2022-01-02 DIAGNOSIS — Z794 Long term (current) use of insulin: Secondary | ICD-10-CM | POA: Insufficient documentation

## 2022-01-02 DIAGNOSIS — E119 Type 2 diabetes mellitus without complications: Secondary | ICD-10-CM | POA: Diagnosis not present

## 2022-01-02 LAB — CBC
HCT: 47.2 % — ABNORMAL HIGH (ref 36.0–46.0)
Hemoglobin: 14.9 g/dL (ref 12.0–15.0)
MCH: 29.1 pg (ref 26.0–34.0)
MCHC: 31.6 g/dL (ref 30.0–36.0)
MCV: 92.2 fL (ref 80.0–100.0)
Platelets: 280 10*3/uL (ref 150–400)
RBC: 5.12 MIL/uL — ABNORMAL HIGH (ref 3.87–5.11)
RDW: 13.9 % (ref 11.5–15.5)
WBC: 7.9 10*3/uL (ref 4.0–10.5)
nRBC: 0 % (ref 0.0–0.2)

## 2022-01-02 LAB — BASIC METABOLIC PANEL
Anion gap: 8 (ref 5–15)
BUN: 18 mg/dL (ref 6–20)
CO2: 24 mmol/L (ref 22–32)
Calcium: 9.4 mg/dL (ref 8.9–10.3)
Chloride: 108 mmol/L (ref 98–111)
Creatinine, Ser: 0.8 mg/dL (ref 0.44–1.00)
GFR, Estimated: >60 mL/min (ref >60)
Glucose, Bld: 144 mg/dL — ABNORMAL HIGH (ref 70–99)
Potassium: 3.8 mmol/L (ref 3.5–5.1)
Sodium: 140 mmol/L (ref 135–145)

## 2022-01-02 LAB — URINALYSIS, ROUTINE W REFLEX MICROSCOPIC
Bilirubin Urine: NEGATIVE
Glucose, UA: >1000 mg/dL — AB
Hgb urine dipstick: NEGATIVE
Ketones, ur: NEGATIVE mg/dL
Nitrite: NEGATIVE
Protein, ur: NEGATIVE mg/dL
Specific Gravity, Urine: 1.014 (ref 1.005–1.030)
pH: 6.5 (ref 5.0–8.0)

## 2022-01-02 MED ORDER — CEPHALEXIN 500 MG PO CAPS: 500.0000 mg | ORAL_CAPSULE | Freq: Two times a day (BID) | ORAL | 0 refills | Status: AC

## 2022-01-02 NOTE — ED Triage Notes (Signed)
Patient here POV from Home.  Endorses Dysuria for approximately 3 Days associated with left Sided Flank Pain. Noted Hematuria today.  No Known Fevers. No N/V/D.   NAD noted during Triage. A&Ox4. GCS 15. Ambulatory.

## 2022-01-02 NOTE — ED Provider Notes (Signed)
[facility] EMERGENCY DEPT Provider Note   CSN: [id number] Arrival date & time: 01/02/22  1419     History  Chief Complaint  Patient presents with   Hematuria    Leah Powers is a 61 y.o. female.  61 year old female with history of UTI, stones, retention, HTN, DM. Presents with 7 days left flank pain, 4 days of dysuria, onset of hematuria today. Denies nausea, vomiting, fevers, chills, changes in bowel habits.        Home Medications Prior to Admission medications   Medication Sig Start Date End Date Taking? Authorizing Provider  Dulaglutide (TRULICITY) 1.5 XT/0.6YI SOPN Inject 1.5 mg into the skin once a week. 11/04/21   Tawnya Crook, MD  albuterol (VENTOLIN HFA) 108 (90 Base) MCG/ACT inhaler Inhale 2 puffs into the lungs every 6 (six) hours as needed for wheezing or shortness of breath. 04/01/21   Allwardt, Randa Evens, PA-C  B Complex Vitamins (B-COMPLEX/B-12) TABS  08/07/21   [provider]  cephALEXin (KEFLEX) 500 MG capsule Take 1 capsule (500 mg total) by mouth 2 (two) times daily for 3 days. 01/02/22 01/05/22 Yes Tacy Learn, PA-C  cetirizine (ZYRTEC) 5 MG tablet Take 5 mg by mouth 2 (two) times daily as needed for allergies.     [provider]  Elastic Bandages & Supports (MEDICAL COMPRESSION SOCKS) MISC Wear compression stockings daily with upright activity. Remove at bedtime. 04/01/21   Allwardt, Randa Evens, PA-C  FARXIGA 5 MG TABS tablet Take 1 tablet by mouth once daily 08/28/21   Tawnya Crook, MD  furosemide (LASIX) 20 MG tablet Take 1 tablet (20 mg total) by mouth daily. 08/01/21   Tawnya Crook, MD  gabapentin (NEURONTIN) 300 MG capsule Take as directed ('300mg'$  5 times a day) 12/27/20   Robyne Askew R, PA-C  hydrocortisone 2.5 % cream Apply topically. 10/28/21   [provider]  ketoconazole (NIZORAL) 2 % shampoo Apply 1 Application topically 3 (three) times a week. 10/28/21   [provider]  Lancets  [facility] DELICA PLUS [id number]) MISC Inject 1 Stick into the skin daily. 04/01/21   Allwardt, Randa Evens, PA-C  levothyroxine (SYNTHROID) 150 MCG tablet Take 1 tablet (150 mcg total) by mouth daily. 11/13/21   Tawnya Crook, MD  lithium carbonate 300 MG capsule Take 3 capsules (900 mg total) by mouth at bedtime. 10/31/21   Donnal Moat T, PA-C  losartan (COZAAR) 50 MG tablet Take 1 tablet (50 mg total) by mouth daily. 08/01/21   Tawnya Crook, MD  mupirocin ointment Drue Stager) 2 % Apply topical to aa qd 05/15/21   Lavonna Monarch, MD  naproxen sodium (ALEVE) 220 MG tablet Take 220 mg by mouth daily as needed (pain). 03/17/18   [provider]  [facility] VERIO test strip 1 each by Other route daily. 04/01/21   Allwardt, Randa Evens, PA-C  rosuvastatin (CRESTOR) 20 MG tablet Take 1 tablet (20 mg total) by mouth daily. 04/01/21 08/01/21  Allwardt, Randa Evens, PA-C      Allergies    Eucalyptus oil, Haldol [haloperidol decanoate], Molds & smuts, Other, Penicillins, Bee venom, Corn oil, Corn-containing products, Demeclocycline, Diflunisal, Haloperidol lactate, Hydrochlorothiazide, Hydrocodone-acetaminophen, Imipramine, Metformin, Metformin and related, Nitrofurantoin, Penicillin g, Sulfamethoxazole-trimethoprim, Tetracycline hcl, Vicodin [hydrocodone-acetaminophen], Cephalexin, Doxycycline, Erythromycin, Macrodantin, Motrin [ibuprofen], Septra [bactrim], Tetracyclines & related, and Tofranil-pm    Review of Systems   Review of Systems Negative except as per HPI Physical Exam Updated Vital Signs BP 139/72 (BP Location:  Right Arm)   Pulse 63   Temp 99 F (37.2 C)   Resp 18   Ht 5' 7.5" (1.715 m)   Wt 111.6 kg   LMP 07/30/2013   SpO2 95%   BMI 37.97 kg/m  Physical Exam Vitals and nursing note reviewed.  Constitutional:      General: She is not in acute distress.    Appearance: She is well-developed. She is not diaphoretic.  HENT:     Head: Normocephalic and atraumatic.  Cardiovascular:      Rate and Rhythm: Normal rate and regular rhythm.     Heart sounds: Normal heart sounds.  Pulmonary:     Effort: Pulmonary effort is normal.     Breath sounds: Normal breath sounds.  Abdominal:     Palpations: Abdomen is soft.     Tenderness: There is abdominal tenderness in the left lower quadrant. There is no right CVA tenderness or left CVA tenderness.  Skin:    General: Skin is warm and dry.     Findings: No erythema or rash.  Neurological:     Mental Status: She is alert and oriented to person, place, and time.  Psychiatric:        Behavior: Behavior normal.     ED Results / Procedures / Treatments   Labs (all labs ordered are listed, but only abnormal results are displayed) Labs Reviewed  URINALYSIS, ROUTINE W REFLEX MICROSCOPIC - Abnormal; Notable for the following components:      Result Value   Glucose, UA >1,000 (*)    Leukocytes,Ua TRACE (*)    All other components within normal limits  BASIC METABOLIC PANEL - Abnormal; Notable for the following components:   Glucose, Bld 144 (*)    All other components within normal limits  CBC - Abnormal; Notable for the following components:   RBC 5.12 (*)    HCT 47.2 (*)    All other components within normal limits    EKG None  Radiology CT Renal Stone Study  Result Date: 01/02/2022 CLINICAL DATA:  Left-sided flank pain x3 days.  Hematuria today. EXAM: CT ABDOMEN AND PELVIS WITHOUT CONTRAST TECHNIQUE: Multidetector CT imaging of the abdomen and pelvis was performed following the standard protocol without IV contrast. RADIATION DOSE REDUCTION: This exam was performed according to the departmental dose-optimization program which includes automated exposure control, adjustment of the mA and/or kV according to patient size and/or use of iterative reconstruction technique. COMPARISON:  CT July 07, 2020 FINDINGS: Lower chest: No acute abnormality. Hepatobiliary: Recent unremarkable noncontrast enhanced appearance of the hepatic  parenchyma. Gallbladder surgically absent. No biliary ductal dilation. Pancreas: No pancreatic ductal dilation or evidence of acute inflammation. Spleen: No splenomegaly. Adrenals/Urinary Tract: Stable 3.1 cm right adrenal nodule which measures Hounsfield units of -10, compatible with a benign adrenal adenoma with greater than 1 year stability requiring no additional imaging follow-up. Left adrenal gland appears normal. No hydronephrosis.  No renal, ureteral or bladder calculi. Urinary bladder is unremarkable for degree of distension. Stomach/Bowel: No radiopaque enteric contrast material was administered. Stomach is minimally distended limiting evaluation. No pathologic dilation of small or large bowel. Scattered small bowel diverticula and colonic diverticulosis without findings of acute diverticulitis. Normal appendix. Vascular/Lymphatic: Normal caliber abdominal aorta. Aortic atherosclerosis. No pathologically enlarged abdominal or pelvic lymph nodes. 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 No suspicious adnexal mass. Other: No significant abdominopelvic free fluid. Small fat containing paraumbilical. Musculoskeletal: Grade 1 L4 on L5 degenerative anterolisthesis. IMPRESSION: 1.  No acute abnormality in the abdomen or pelvis. Specifically, no evidence of urolithiasis or obstructive uropathy. 2. Stable 3.1 cm right adrenal adenoma requiring no additional imaging follow-up. 3. Scattered small bowel diverticula and colonic diverticulosis without findings of acute diverticulitis. 4.  Aortic Atherosclerosis (ICD10-I70.0). Electronically Signed   By: Dahlia Bailiff M.D.   On: 01/02/2022 17:26    Procedures Procedures    Medications Ordered in ED Medications - No data to display  ED Course/ Medical Decision Making/ A&P                           Medical Decision Making Amount and/or Complexity of Data Reviewed Labs: ordered. Radiology: ordered.   61 year old female  with history as above presents with left flank pain x1 week with dysuria for the past few days and now seeing blood when she wipes and on a panty liner.  She is afebrile, well-appearing, nontoxic and in no distress.  Vitals are reassuring.  She is found to have trace leukocytes in her urine.  Greater than thousand glucose likely related to her medications for her diabetes.  Her CBC is reassuring with normal white count.  BMP with normal renal function.  CT without contrast does not reveal stone, does show stable findings from prior exams.  Discussed with patient, her Keflex allergy is limited to causing diarrhea.  Will trial short course of Keflex with plan to recheck with her primary care provider.        Final Clinical Impression(s) / ED Diagnoses Final diagnoses:  Urinary tract infection in female    Rx / DC Orders ED Discharge Orders          Ordered    cephALEXin (KEFLEX) 500 MG capsule  2 times daily        01/02/22 1809              Tacy Learn, PA-C 01/02/22 1810    Fransico Meadow, MD 01/03/22 2007

## 2022-01-02 NOTE — ED Notes (Signed)
Dysuria and hematuria x 4 days, left flank pain Denies N/V/D

## 2022-01-02 NOTE — Telephone Encounter (Signed)
Patient will need to be scheduled for evaluation with someone in the office.

## 2022-01-02 NOTE — Telephone Encounter (Signed)
Patient states: - Experiencing blood when wiping as well as in a panty liner she was wearing  - Has been having flank pain described as tenderness in kidney area   Patient has been transferred to triage.

## 2022-01-02 NOTE — Discharge Instructions (Addendum)
Antibiotics as prescribed and complete the full course. Recheck with your doctor.

## 2022-01-02 NOTE — Telephone Encounter (Signed)
FYI Patient went to ED on 01/02/22.

## 2022-01-02 NOTE — Telephone Encounter (Signed)
atient Name: Leah Powers IS Gender: Female DOB: 28-Dec-1960 Age: 61 Y 11 M 22 D Return Phone Number: [phone] (Primary) Address: City/ State/ Zip: [place]  [address] Client [facility] [place] [facility] [place] [facility] Contact Type Call Who Is Calling Patient / Member / Family / Caregiver Call Type Triage / Clinical Relationship To Patient Self Return Phone Number [phone] (Primary) Chief Complaint Foot or Ankle Injury Reason for Call Symptomatic / Request for Health Information Initial Comment Patient transferred from office. Patient saw blood upon wiping and is not menstruating as she is 60. Patient also has tenderness around kidney area. Translation No Nurse Assessment Nurse: Vallery Sa, RN, Cathy Date/Time (Eastern Time): 01/02/2022 12:26:00 PM Confirm and document reason for call. If symptomatic, describe symptoms. ---Deanna states she developed urinary urgency yesterday and blood with urination with left flank today. No fever. No injury in the past 3 days. Alert and responsive. Does the patient have any new or worsening symptoms? ---Yes Will a triage be completed? ---Yes Related visit to physician within the last 2 weeks? ---No Does the PT have any chronic conditions? (i.e. diabetes, asthma, this includes High risk factors for pregnancy, etc.) ---Yes List chronic conditions. ---Diabetes, High Blood Pressure, Bipolar, Kidney Stones 1990's Is this a behavioral health or substance abuse call? ---No Guidelines Guideline Title Affirmed Question Affirmed Notes Nurse Date/Time (Eastern Time) Flank Pain Pain or burning with passing urine (urination) Tr[place], RN, Tye [place] 01/02/2022 12:29:30 PM Urine - Blood In [1] Diffuse (all over) muscle pains in the shoulders, arms, legs, and back AND Vallery Sa, RN, Cathy 01/02/2022 12:32:37 PM PLEASE NOTE: All timestamps contained within this report are  represented as [place] Standard Time. CONFIDENTIALTY NOTICE: This fax transmission is intended only for the addressee. It contains information that is legally privileged, confidential or otherwise protected from use or disclosure. If you are not the intended recipient, you are strictly prohibited from reviewing, disclosing, copying using or disseminating any of this information or taking any action in reliance on or regarding this information. If you have received this fax in error, please notify us immediately by telephone so that we can arrange for its return to [place]. Phone: [phone], Toll-Free: [phone], Fax: [phone] Page: 2 of 2 Call Id: [id number] Guidelines Guideline Title Affirmed Question Affirmed Notes Nurse Date/Time Eilene Ghazi Time) [2] dark (cola or tea-colored) or redcolored urine Disp. Time Eilene Ghazi Time) Disposition Final User 01/02/2022 12:32:22 PM See HCP within 4 Hours (or PCP triage) Vallery Sa, RN, Tye [place] 01/02/2022 12:36:22 PM Go to ED Now Yes Vallery Sa, RN, Tye [place] Final Disposition 01/02/2022 12:36:22 PM Go to ED Now Yes Vallery Sa, RN, Rosey [place] Disagree/Comply Comply Caller Understands Yes PreDisposition Did not know what to do Care Advice Given Per Guideline SEE HCP (OR PCP TRIAGE) WITHIN 4 HOURS: * IF OFFICE WILL BE OPEN: You need to be seen within the next 3 or 4 hours. Call your doctor (or NP/PA) now or as soon as the office opens. PAIN MEDICINES: * For pain relief, you can take either acetaminophen, ibuprofen, or naproxen. * ACETAMINOPHEN - REGULAR STRENGTH TYLENOL: Take 650 mg (two 325 mg pills) by mouth every 4 to 6 hours as needed. Each Regular Strength Tylenol pill has 325 mg of acetaminophen. The most you should take is 10 pills a day (3,250 mg total). Note: In [place], the maximum is 12 pills a day (3,900 mg total). CALL BACK IF: * You become worse CARE ADVICE given per Flank  Pain (Adult) guideline. GO TO ED NOW: * You need to be seen in the  Emergency Department. * Go to the ED at ___________ [place] now. Drive carefully. BRING MEDICINES: * Bring a list of your current medicines when you go to the Emergency Department (ER). * Bring the pill bottles too. This will help the doctor (or NP/PA) to make certain you are taking the right medicines and the right dose. CARE ADVICE given per Urine, Blood In (Adult) guideline. Referrals [place] - ED

## 2022-01-15 ENCOUNTER — Ambulatory Visit (INDEPENDENT_AMBULATORY_CARE_PROVIDER_SITE_OTHER): Payer: Commercial Managed Care - HMO

## 2022-01-15 DIAGNOSIS — M2140 Flat foot [pes planus] (acquired), unspecified foot: Secondary | ICD-10-CM

## 2022-01-15 NOTE — Progress Notes (Signed)
Patient presents today to pick up custom molded foot orthotics, diagnosed with pes planus by Dr. Paulla Dolly.   Orthotics were dispensed and fit was satisfactory. Reviewed instructions for break-in and wear. Written instructions given to patient.  Patient will follow up as needed.   [facility] - order # [id number]

## 2022-01-17 ENCOUNTER — Ambulatory Visit (INDEPENDENT_AMBULATORY_CARE_PROVIDER_SITE_OTHER): Payer: Commercial Managed Care - HMO | Admitting: Family Medicine

## 2022-01-17 ENCOUNTER — Encounter: Payer: Self-pay | Admitting: Family Medicine

## 2022-01-17 VITALS — BP 132/80 | HR 76 | Temp 98.2°F | Ht 67.5 in | Wt 250.2 lb

## 2022-01-17 DIAGNOSIS — J40 Bronchitis, not specified as acute or chronic: Secondary | ICD-10-CM | POA: Diagnosis not present

## 2022-01-17 MED ORDER — FLUCONAZOLE 150 MG PO TABS: 150.0000 mg | ORAL_TABLET | ORAL | 0 refills | Status: DC | PRN

## 2022-01-17 MED ORDER — LEVOFLOXACIN 500 MG PO TABS: 500.0000 mg | ORAL_TABLET | Freq: Every day | ORAL | 0 refills | Status: DC

## 2022-01-17 MED ORDER — BENZONATATE 100 MG PO CAPS: 100.0000 mg | ORAL_CAPSULE | Freq: Three times a day (TID) | ORAL | 0 refills | Status: DC | PRN

## 2022-01-17 NOTE — Patient Instructions (Signed)
It was very nice to see you today!  Meds send to pharmacy   PLEASE NOTE:  If you had any lab tests please let us know if you have not heard back within a few days. You may see your results on MyChart before we have a chance to review them but we will give you a call once they are reviewed by [place]. If we ordered any referrals today, please let us know if you have not heard from their office within the next week.   Please try these tips to maintain a healthy lifestyle:  Eat most of your calories during the day when you are active. Eliminate processed foods including packaged sweets (pies, cakes, cookies), reduce intake of potatoes, white bread, white pasta, and white rice. Look for whole grain options, oat flour or almond flour.  Each meal should contain half fruits/vegetables, one quarter protein, and one quarter carbs (no bigger than a computer mouse).  Cut down on sweet beverages. This includes juice, soda, and sweet tea. Also watch fruit intake, though this is a healthier sweet option, it still contains natural sugar! Limit to 3 servings daily.  Drink at least 1 glass of water with each meal and aim for at least 8 glasses per day  Exercise at least 150 minutes every week.

## 2022-01-17 NOTE — Progress Notes (Signed)
Subjective:     Patient ID: Leah Powers, female    DOB: Apr 13, 1960, 61 y.o.   MRN: [id number]  Chief Complaint  Patient presents with   Cough    Cough with yellow phlegm  for 5 days Has been taking Tussin DM and had Aleve last night     HPI Cough w/yellow for 5 days  rattling in chest. Soreness chest.  Some congestion.  Clicking in R ear.  No sore throat.  No f/c  no sob.   Hasn't tested for covid.   Health Maintenance Due  Topic Date Due   COLONOSCOPY (Pts 45-30yr Insurance coverage will need to be confirmed)  Never done    Past Medical History:  Diagnosis Date   Allergy    Arthritis    L hand, R ankle  (10/08/2016)   [facility]    Depression    Diabetes mellitus without complication ([facility]    Fibromyalgia    told that the breakout on the upper back , could be over active nerves , also treated with cream & Gabapentin- [facility] [place]    History of kidney stones    spontaneous passing   Hypertension    Hypothyroid    Pneumonia ~ 1967; 07/2016   Pyelonephritis 1997   treated with IV antibiotic     Past Surgical History:  Procedure Laterality Date   BLADDER INSTILLATION  X 2   CHALAZION EXCISION Bilateral    R- eye X2 , L eye- X1   FOOT ARTHRODESIS Right 10/08/2016   Procedure: RIGHT TRIPLE ARTHRODESIS;  Surgeon: DNewt Minion MD;  [facility]  Service: Orthopedics;  Laterality: Right;   FOOT ARTHRODESIS, TRIPLE Right 10/08/2016   ankle   FRENULECTOMY, LINGUAL  1970s   LAPAROSCOPIC CHOLECYSTECTOMY  1996   PALATE SURGERY  1970s   tissue removed from upper palate as a child    URETHRAL DILATION      Outpatient Medications Prior to Visit  Medication Sig Dispense Refill   albuterol (VENTOLIN HFA) 108 (90 Base) MCG/ACT inhaler Inhale 2 puffs into the lungs every 6 (six) hours as needed for wheezing or shortness of breath. 1 each 2   B Complex Vitamins (B-COMPLEX/B-12) TABS      cetirizine (ZYRTEC) 5 MG tablet Take 5 mg by  mouth 2 (two) times daily as needed for allergies.      Elastic Bandages & Supports (MEDICAL COMPRESSION SOCKS) MISC Wear compression stockings daily with upright activity. Remove at bedtime. 2 each 0   FARXIGA 5 MG TABS tablet Take 1 tablet by mouth once daily 90 tablet 1   furosemide (LASIX) 20 MG tablet Take 1 tablet (20 mg total) by mouth daily. 90 tablet 3   gabapentin (NEURONTIN) 300 MG capsule Take as directed ('300mg'$  5 times a day) 450 capsule 4   hydrocortisone 2.5 % cream Apply topically.     ketoconazole (NIZORAL) 2 % shampoo Apply 1 Application topically 3 (three) times a week.     Lancets (ONETOUCH DELICA PLUS [id number] MISC Inject 1 Stick into the skin daily. 100 each 2   levothyroxine (SYNTHROID) 150 MCG tablet Take 1 tablet (150 mcg total) by mouth daily. 90 tablet 3   lithium carbonate 300 MG capsule Take 3 capsules (900 mg total) by mouth at bedtime. 270 capsule 1   losartan (COZAAR) 50 MG tablet Take 1 tablet (50 mg total) by mouth daily. 90 tablet 3   mupirocin ointment (BACTROBAN)  2 % Apply topical to aa qd 22 g 3   naproxen sodium (ALEVE) 220 MG tablet Take 220 mg by mouth daily as needed (pain).     ONETOUCH VERIO test strip 1 each by Other route daily. 100 each 3   Dulaglutide (TRULICITY) 1.5 FI/4.3PI SOPN Inject 1.5 mg into the skin once a week. 2 mL 0   rosuvastatin (CRESTOR) 20 MG tablet Take 1 tablet (20 mg total) by mouth daily. 90 tablet 1   No facility-administered medications prior to visit.    Allergies  Allergen Reactions   Eucalyptus Oil Shortness Of Breath    Other reaction(s): flush, cant breath   Haldol [Haloperidol Decanoate] Other (See Comments)    Drooling, weaving in floor, parkinson's syndrome (couldn't eat or talk) , pt was hospitalized    Molds & Smuts Shortness Of Breath and Itching   Other Anaphylaxis, Shortness Of Breath, Itching and Swelling    Bleu cheese    Penicillins Anaphylaxis and Rash    Pt was 61 years old  Has patient had a PCN  reaction causing immediate rash, facial/tongue/throat swelling, SOB or lightheadedness with hypotension: Yes Has patient had a PCN reaction causing severe rash involving mucus membranes or skin necrosis: Unknown Has patient had a PCN reaction that required hospitalization: No Has patient had a PCN reaction occurring within the last 10 years: No If all of the above answers are "NO", then may proceed with Cephalosporin use.    Bee Venom Other (See Comments)    Yellow jackets and wasps-stung by 22, MD told pt that she wouldn't have resistance if stung again    Corn Oil     Other reaction(s): diarrhea, lots   Corn-Containing Products Nausea And Vomiting and Other (See Comments)    Stomach distress    Demeclocycline Other (See Comments)   Diflunisal Other (See Comments)    Upset stomach Other reaction(s): Unknown   Haloperidol Lactate     Other reaction(s): stumble around, droul   Hydrochlorothiazide     Other reaction(s): elevated lithium level   Hydrocodone-Acetaminophen Other (See Comments)    Other reaction(s): abd pain   Hydromorphone Other (See Comments)   Imipramine     Other reaction(s): rash   Metformin     Other reaction(s): stomach upset   Metformin And Related    Nitrofurantoin     Other reaction(s): rash   Penicillin G Other (See Comments)    Other reaction(s): rash   Sulfamethoxazole-Trimethoprim     Other reaction(s): rash   Tetracycline Hcl     Other reaction(s): rash   Vicodin [Hydrocodone-Acetaminophen] Diarrhea and Other (See Comments)    Upset stomach   Cephalexin Rash    Other reaction(s): diarrhea   Doxycycline Rash    Other reaction(s): rash   Erythromycin Rash    Other reaction(s): rash   Macrodantin Rash   Motrin [Ibuprofen] Palpitations   Septra [Bactrim] Rash   Tetracyclines & Related Rash   Tofranil-Pm Rash   ROS neg/noncontributory except as noted HPI/below      Objective:     BP 132/80   Pulse 76   Temp 98.2 F (36.8 C) (Temporal)    Ht 5' 7.5" (1.715 m)   Wt 250 lb 4 oz (113.5 kg)   LMP 07/30/2013   SpO2 95%   BMI 38.62 kg/m  Wt Readings from Last 3 Encounters:  01/17/22 250 lb 4 oz (113.5 kg)  01/02/22 246 lb 0.5 oz (111.6 kg)  10/03/21 246 lb  2 oz (111.6 kg)    Physical Exam   Gen: WDWN NAD HEENT: NCAT, conjunctiva not injected, sclera nonicteric TM WNL B, OP moist, no exudates   congested NECK:  supple, no thyromegaly, no nodes, no carotid bruits CARDIAC: RRR, S1S2+,  LUNGS: CTAB. No wheezes but congested EXT:  no edema MSK: no gross abnormalities.  NEURO: A&O x3.  CN II-XII intact.  PSYCH: normal mood. Good eye contact     Assessment & Plan:   Problem List Items Addressed This Visit   None Visit Diagnoses     Bronchitis    -  Primary      Bronchitis-acute.  MULT drug allergies.  Can take levaquin.  '500mg'$  daily x 5d.  Tessalon perles '100mg'$  tid prn.  Has albuterol if needed.   Diflucan '150mg'$  if needed for yeast from abx  Meds ordered this encounter  Medications   levofloxacin (LEVAQUIN) 500 MG tablet    Sig: Take 1 tablet (500 mg total) by mouth daily.    Dispense:  5 tablet    Refill:  0   benzonatate (TESSALON PERLES) 100 MG capsule    Sig: Take 1 capsule (100 mg total) by mouth 3 (three) times daily as needed.    Dispense:  20 capsule    Refill:  0   fluconazole (DIFLUCAN) 150 MG tablet    Sig: Take 1 tablet (150 mg total) by mouth every three (3) days as needed.    Dispense:  2 tablet    Refill:  0    Wellington Hampshire, MD

## 2022-01-21 ENCOUNTER — Encounter: Payer: Self-pay | Admitting: Family Medicine

## 2022-01-21 ENCOUNTER — Other Ambulatory Visit: Payer: Self-pay | Admitting: *Deleted

## 2022-01-21 MED ORDER — BENZONATATE 100 MG PO CAPS: 100.0000 mg | ORAL_CAPSULE | Freq: Three times a day (TID) | ORAL | 0 refills | Status: DC | PRN

## 2022-01-21 MED ORDER — FLUCONAZOLE 150 MG PO TABS: 150.0000 mg | ORAL_TABLET | ORAL | 0 refills | Status: DC | PRN

## 2022-01-21 MED ORDER — LEVOFLOXACIN 500 MG PO TABS: 500.0000 mg | ORAL_TABLET | Freq: Every day | ORAL | 0 refills | Status: DC

## 2022-01-23 ENCOUNTER — Other Ambulatory Visit: Payer: Commercial Managed Care - HMO

## 2022-01-30 ENCOUNTER — Other Ambulatory Visit: Payer: Commercial Managed Care - HMO

## 2022-01-31 ENCOUNTER — Ambulatory Visit: Payer: Commercial Managed Care - HMO | Admitting: Family Medicine

## 2022-02-03 ENCOUNTER — Encounter: Payer: Self-pay | Admitting: Family Medicine

## 2022-02-03 ENCOUNTER — Encounter: Payer: Self-pay | Admitting: Physician Assistant

## 2022-02-03 ENCOUNTER — Ambulatory Visit (INDEPENDENT_AMBULATORY_CARE_PROVIDER_SITE_OTHER): Payer: Commercial Managed Care - HMO | Admitting: Physician Assistant

## 2022-02-03 DIAGNOSIS — Z79899 Other long term (current) drug therapy: Secondary | ICD-10-CM | POA: Diagnosis not present

## 2022-02-03 DIAGNOSIS — R5381 Other malaise: Secondary | ICD-10-CM

## 2022-02-03 DIAGNOSIS — F319 Bipolar disorder, unspecified: Secondary | ICD-10-CM

## 2022-02-03 DIAGNOSIS — R5383 Other fatigue: Secondary | ICD-10-CM

## 2022-02-03 DIAGNOSIS — E032 Hypothyroidism due to medicaments and other exogenous substances: Secondary | ICD-10-CM

## 2022-02-03 NOTE — Progress Notes (Signed)
[facility] Med Check  Patient ID: Leah Powers,  MRN: [id number]  PCP: Tawnya Crook, MD  Date of Evaluation: 02/03/2022 Time spent:30 minutes  Chief Complaint:  Chief Complaint   Follow-up    HISTORY/CURRENT STATUS: HPI for routine 3 month med check.  Feels that meds are working well. Is overwhelmed with insurance. She'll be losing it at the end of the year, might be going on MCD. Not sure. Is able to enjoy things even with the stressors.  She tires easily at times but she is not working so she can laze around if she wants.   No extreme sadness, tearfulness, or feelings of hopelessness.  Sleeps well most of the time. ADLs and personal hygiene are normal.   Denies any changes in concentration, making decisions, or remembering things.  Appetite has not changed.  Weight is stable.  Not having anxiety that is not triggered.  No panic attacks.  Denies suicidal or homicidal thoughts.  Patient denies increased energy with decreased need for sleep, increased talkativeness, racing thoughts, impulsivity or risky behaviors, increased spending, increased libido, grandiosity, increased irritability or anger, paranoia, or hallucinations.  Denies dizziness, syncope, seizures, numbness, tingling, tremor, tics, unsteady gait, slurred speech, confusion. Denies muscle or joint pain, stiffness, or dystonia.  Individual Medical History/ Review of Systems: Changes? :Yes    Had a UTI, got hearing aids  Past medications for mental health diagnoses include: Prolixin, Haldol, Ativan, lithium, Xanax, Synthroid, gabapentin  Allergies: Eucalyptus oil, Haldol [haloperidol decanoate], Molds & smuts, Other, Penicillins, Bee venom, Corn oil, Corn-containing products, Demeclocycline, Diflunisal, Haloperidol lactate, Hydrochlorothiazide, Hydrocodone-acetaminophen, Hydromorphone, Imipramine, Metformin, Metformin and related, Nitrofurantoin, Penicillin g, Sulfamethoxazole-trimethoprim, Tetracycline hcl, Vicodin  [hydrocodone-acetaminophen], Cephalexin, Doxycycline, Erythromycin, Macrodantin, Motrin [ibuprofen], Septra [bactrim], Tetracyclines & related, and Tofranil-pm  Current Medications:  Current Outpatient Medications:    albuterol (VENTOLIN HFA) 108 (90 Base) MCG/ACT inhaler, Inhale 2 puffs into the lungs every 6 (six) hours as needed for wheezing or shortness of breath., Disp: 1 each, Rfl: 2   cetirizine (ZYRTEC) 5 MG tablet, Take 5 mg by mouth 2 (two) times daily as needed for allergies. , Disp: , Rfl:    Elastic Bandages & Supports (MEDICAL COMPRESSION SOCKS) MISC, Wear compression stockings daily with upright activity. Remove at bedtime., Disp: 2 each, Rfl: 0   FARXIGA 5 MG TABS tablet, Take 1 tablet by mouth once daily, Disp: 90 tablet, Rfl: 1   furosemide (LASIX) 20 MG tablet, Take 1 tablet (20 mg total) by mouth daily., Disp: 90 tablet, Rfl: 3   gabapentin (NEURONTIN) 300 MG capsule, Take as directed ('300mg'$  5 times a day), Disp: 450 capsule, Rfl: 4   hydrocortisone 2.5 % cream, Apply topically., Disp: , Rfl:    ketoconazole (NIZORAL) 2 % shampoo, Apply 1 Application topically 3 (three) times a week., Disp: , Rfl:    Lancets (ONETOUCH DELICA PLUS [id number]) MISC, Inject 1 Stick into the skin daily., Disp: 100 each, Rfl: 2   levothyroxine (SYNTHROID) 150 MCG tablet, Take 1 tablet (150 mcg total) by mouth daily. (Patient taking differently: Take 125 mcg by mouth daily.), Disp: 90 tablet, Rfl: 3   lithium carbonate 300 MG capsule, Take 3 capsules (900 mg total) by mouth at bedtime., Disp: 270 capsule, Rfl: 1   losartan (COZAAR) 50 MG tablet, Take 1 tablet (50 mg total) by mouth daily., Disp: 90 tablet, Rfl: 3   mupirocin ointment (BACTROBAN) 2 %, Apply topical to aa qd, Disp: 22 g, Rfl: 3   naproxen sodium (ALEVE)  220 MG tablet, Take 220 mg by mouth daily as needed (pain)., Disp: , Rfl:    ONETOUCH VERIO test strip, 1 each by Other route daily., Disp: 100 each, Rfl: 3   B Complex Vitamins  (B-COMPLEX/B-12) TABS, , Disp: , Rfl:    benzonatate (TESSALON PERLES) 100 MG capsule, Take 1 capsule (100 mg total) by mouth 3 (three) times daily as needed., Disp: 20 capsule, Rfl: 0   fluconazole (DIFLUCAN) 150 MG tablet, Take 1 tablet (150 mg total) by mouth every three (3) days as needed., Disp: 2 tablet, Rfl: 0   levofloxacin (LEVAQUIN) 500 MG tablet, Take 1 tablet (500 mg total) by mouth daily., Disp: 5 tablet, Rfl: 0 Medication Side Effects: none  Family Medical/ Social History: Changes? no  MENTAL HEALTH EXAM:  Last menstrual period 07/30/2013.There is no height or weight on file to calculate BMI.  General Appearance: Casual, Well Groomed, and Obese  Eye Contact:  Good  Speech:  Clear and Coherent, Normal Rate, and Talkative  Volume:  Normal  Mood:  Euthymic  Affect:  Congruent  Thought Process:  Goal Directed and Descriptions of Associations: Circumstantial  Orientation:  Full (Time, Place, and Person)  Thought Content: Logical   Suicidal Thoughts:  No  Homicidal Thoughts:  No  Memory:  WNL  Judgement:  Good  Insight:  Good  Psychomotor Activity:  Normal  Concentration:  Concentration: Good and Attention Span: Good  Recall:  Good  Fund of Knowledge: Good  Language: Good  Assets:  Desire for Improvement Financial Resources/Insurance Housing Transportation  ADL's:  Intact  Cognition: WNL  Prognosis:  Good   Labs 01/02/2022 CBC RBCs 5.1, hematocrit 47.2, otherwise normal BMP glucose 144, BUN 18, creatinine 0.8   DIAGNOSES:    ICD-10-CM   1. Bipolar I disorder (HCC)  F31.9 Lithium level    2. Hypothyroidism due to medication  E03.2     3. Malaise and fatigue  R53.81 Lithium level   R53.83     4. Encounter for long-term (current) use of medications  Z79.899 Lithium level      Receiving Psychotherapy: No   RECOMMENDATIONS:  PDMP was reviewed.  Tramadol 08/18/2021. I provided 30 minutes of face to face time during this encounter, including time spent  before and after the visit in records review, medical decision making, counseling pertinent to today's visit, and charting.   Doing well as far as her mental health goes so no changes will be made.  Continue gabapentin 300 mg,  2 po q am, 1 mid-day, 2 po qhs.  Per another provider.   Continue levothyroxine 125 mcg every morning.  Per her PCP. Continue lithium 300 mg, 3 p.o. nightly. Lithium level ordered.  She will be having multiple labs drawn tomorrow, per her PCP.   Return in 3 months. (Or see another provider who takes her insurance.  Okay to refill the lithium for another 3 months.)  Donnal Moat, PA-C

## 2022-02-04 ENCOUNTER — Other Ambulatory Visit (INDEPENDENT_AMBULATORY_CARE_PROVIDER_SITE_OTHER): Payer: Commercial Managed Care - HMO

## 2022-02-04 DIAGNOSIS — Z1329 Encounter for screening for other suspected endocrine disorder: Secondary | ICD-10-CM

## 2022-02-04 DIAGNOSIS — D508 Other iron deficiency anemias: Secondary | ICD-10-CM | POA: Diagnosis not present

## 2022-02-04 DIAGNOSIS — E119 Type 2 diabetes mellitus without complications: Secondary | ICD-10-CM

## 2022-02-04 DIAGNOSIS — E785 Hyperlipidemia, unspecified: Secondary | ICD-10-CM

## 2022-02-04 DIAGNOSIS — E1169 Type 2 diabetes mellitus with other specified complication: Secondary | ICD-10-CM

## 2022-02-04 LAB — CBC WITH DIFFERENTIAL/PLATELET
Basophils Absolute: 0.1 10*3/uL (ref 0.0–0.1)
Basophils Relative: 1 % (ref 0.0–3.0)
Eosinophils Absolute: 0.5 10*3/uL (ref 0.0–0.7)
Eosinophils Relative: 6.1 % — ABNORMAL HIGH (ref 0.0–5.0)
HCT: 45.2 % (ref 36.0–46.0)
Hemoglobin: 14.7 g/dL (ref 12.0–15.0)
Lymphocytes Relative: 23.3 % (ref 12.0–46.0)
Lymphs Abs: 1.9 10*3/uL (ref 0.7–4.0)
MCHC: 32.6 g/dL (ref 30.0–36.0)
MCV: 92.2 fl (ref 78.0–100.0)
Monocytes Absolute: 0.4 10*3/uL (ref 0.1–1.0)
Monocytes Relative: 4.5 % (ref 3.0–12.0)
Neutro Abs: 5.4 10*3/uL (ref 1.4–7.7)
Neutrophils Relative %: 65.1 % (ref 43.0–77.0)
Platelets: 334 10*3/uL (ref 150.0–400.0)
RBC: 4.91 Mil/uL (ref 3.87–5.11)
RDW: 14.3 % (ref 11.5–15.5)
WBC: 8.2 10*3/uL (ref 4.0–10.5)

## 2022-02-04 LAB — LITHIUM LEVEL: Lithium Lvl: 0.5 mmol/L (ref 0.5–1.2)

## 2022-02-04 LAB — COMPREHENSIVE METABOLIC PANEL
ALT: 21 U/L (ref 0–35)
AST: 17 U/L (ref 0–37)
Albumin: 3.9 g/dL (ref 3.5–5.2)
Alkaline Phosphatase: 84 U/L (ref 39–117)
BUN: 18 mg/dL (ref 6–23)
CO2: 28 mEq/L (ref 19–32)
Calcium: 9.1 mg/dL (ref 8.4–10.5)
Chloride: 107 mEq/L (ref 96–112)
Creatinine, Ser: 0.78 mg/dL (ref 0.40–1.20)
GFR: 82.18 mL/min (ref >60.00)
Glucose, Bld: 135 mg/dL — ABNORMAL HIGH (ref 70–99)
Potassium: 4.2 mEq/L (ref 3.5–5.1)
Sodium: 140 mEq/L (ref 135–145)
Total Bilirubin: 0.3 mg/dL (ref 0.2–1.2)
Total Protein: 5.9 g/dL — ABNORMAL LOW (ref 6.0–8.3)

## 2022-02-04 LAB — LIPID PANEL
Cholesterol: 198 mg/dL (ref 0–200)
HDL: 47.9 mg/dL (ref >39.00)
LDL Cholesterol: 123 mg/dL — ABNORMAL HIGH (ref 0–99)
NonHDL: 150
Total CHOL/HDL Ratio: 4
Triglycerides: 133 mg/dL (ref 0.0–149.0)
VLDL: 26.6 mg/dL (ref 0.0–40.0)

## 2022-02-04 LAB — HEMOGLOBIN A1C: Hgb A1c MFr Bld: 6.6 % — ABNORMAL HIGH (ref 4.6–6.5)

## 2022-02-04 LAB — TSH: TSH: 4.26 u[IU]/mL (ref 0.35–5.50)

## 2022-02-04 NOTE — Addendum Note (Signed)
Addended by: Doran Clay A on: 02/04/2022 08:22 AM   Modules accepted: Orders

## 2022-02-04 NOTE — Progress Notes (Signed)
Please let her know Lithium level is good for her, 0.5. No change in dose.

## 2022-02-05 ENCOUNTER — Other Ambulatory Visit: Payer: Self-pay | Admitting: *Deleted

## 2022-02-05 LAB — IRON,TIBC AND FERRITIN PANEL
%SAT: 15 % (calc) — ABNORMAL LOW (ref 16–45)
Ferritin: 20 ng/mL (ref 16–288)
Iron: 55 ug/dL (ref 45–160)
TIBC: 358 mcg/dL (calc) (ref 250–450)

## 2022-02-05 NOTE — Telephone Encounter (Signed)
Lipid was added, patient had labs done on 02/04/22

## 2022-02-10 ENCOUNTER — Encounter: Payer: Self-pay | Admitting: Family Medicine

## 2022-02-10 ENCOUNTER — Ambulatory Visit (INDEPENDENT_AMBULATORY_CARE_PROVIDER_SITE_OTHER): Payer: Commercial Managed Care - HMO | Admitting: Family Medicine

## 2022-02-10 VITALS — BP 136/82 | HR 67 | Temp 97.9°F | Ht 67.5 in | Wt 249.2 lb

## 2022-02-10 DIAGNOSIS — E038 Other specified hypothyroidism: Secondary | ICD-10-CM

## 2022-02-10 DIAGNOSIS — I10 Essential (primary) hypertension: Secondary | ICD-10-CM | POA: Diagnosis not present

## 2022-02-10 DIAGNOSIS — E1169 Type 2 diabetes mellitus with other specified complication: Secondary | ICD-10-CM | POA: Diagnosis not present

## 2022-02-10 DIAGNOSIS — E119 Type 2 diabetes mellitus without complications: Secondary | ICD-10-CM | POA: Diagnosis not present

## 2022-02-10 DIAGNOSIS — D508 Other iron deficiency anemias: Secondary | ICD-10-CM

## 2022-02-10 DIAGNOSIS — E785 Hyperlipidemia, unspecified: Secondary | ICD-10-CM

## 2022-02-10 MED ORDER — LEVOTHYROXINE SODIUM 175 MCG PO TABS: 175.0000 ug | ORAL_TABLET | Freq: Every day | ORAL | 1 refills | Status: DC

## 2022-02-10 MED ORDER — ROSUVASTATIN CALCIUM 20 MG PO TABS: 20.0000 mg | ORAL_TABLET | Freq: Every evening | ORAL | 3 refills | Status: DC

## 2022-02-10 MED ORDER — DAPAGLIFLOZIN PROPANEDIOL 5 MG PO TABS: 5.0000 mg | ORAL_TABLET | Freq: Every day | ORAL | 3 refills | Status: DC

## 2022-02-10 NOTE — Progress Notes (Signed)
Subjective:     Patient ID: Leah Powers, female    DOB: 09/15/1960, 61 y.o.   MRN: [id number]  Chief Complaint  Patient presents with   Hypothyroidism    HPI  Hypothyroidism-121mg daily DM type 2-on farxiga '5mg'$ -A1C better.  Rebelysis too expensive. Trulicity not work and not like doing injections. In May-was more busy. Sugars 117-130's.  Once 149. Working on diet/exercise.  HTN-Pt is on losartan '50mg'$ .  Bp's running 120's/60's.  No ha/dizziness/cp/palp/edema/cough/sob  Took Iron till 10/30. D/t anemia Bipolar-moods ok on meds.  No SI Colon sch 11/30  Health Maintenance Due  Topic Date Due   COLONOSCOPY (Pts 45-434yrInsurance coverage will need to be confirmed)  Never done    Past Medical History:  Diagnosis Date   Allergy    Arthritis    L hand, R ankle  (10/08/2016)   Bipolar 1 disorder ([facility]   Depression    Diabetes mellitus without complication ([facility]   Fibromyalgia    told that the breakout on the upper back , could be over active nerves , also treated with cream & Gabapentin- [facility] [place]    History of kidney stones    spontaneous passing   Hypertension    Hypothyroid    Pneumonia ~ 1967; 07/2016   Pyelonephritis 1997   treated with IV antibiotic     Past Surgical History:  Procedure Laterality Date   BLADDER INSTILLATION  X 2   CHALAZION EXCISION Bilateral    R- eye X2 , L eye- X1   FOOT ARTHRODESIS Right 10/08/2016   Procedure: RIGHT TRIPLE ARTHRODESIS;  Surgeon: DuNewt MinionMD;  [facility] [facility]: [facility]opedics;  Laterality: Right;   FOOT ARTHRODESIS, TRIPLE Right 10/08/2016   ankle   FRENULECTOMY, LINGUAL  1970s   LAPAROSCOPIC CHOLECYSTECTOMY  1996   PALATE SURGERY  1970s   tissue removed from upper palate as a child    URETHRAL DILATION      Outpatient Medications Prior to Visit  Medication Sig Dispense Refill   albuterol (VENTOLIN HFA) 108 (90 Base) MCG/ACT inhaler Inhale 2 puffs into the lungs every 6 (six) hours  as needed for wheezing or shortness of breath. 1 each 2   cetirizine (ZYRTEC) 5 MG tablet Take 5 mg by mouth 2 (two) times daily as needed for allergies.      docusate sodium (COLACE) 100 MG capsule Take 100 mg by mouth 3 (three) times daily.     Elastic Bandages & Supports (MEDICAL COMPRESSION SOCKS) MISC Wear compression stockings daily with upright activity. Remove at bedtime. 2 each 0   furosemide (LASIX) 20 MG tablet Take 1 tablet (20 mg total) by mouth daily. 90 tablet 3   gabapentin (NEURONTIN) 300 MG capsule Take as directed ('300mg'$  5 times a day) 450 capsule 4   hydrocortisone 2.5 % cream Apply topically.     ketoconazole (NIZORAL) 2 % shampoo Apply 1 Application topically 3 (three) times a week.     Lancets (ONETOUCH DELICA PLUS [id number] Inject 1 Stick into the skin daily. 100 each 2   lithium carbonate 300 MG capsule Take 3 capsules (900 mg total) by mouth at bedtime. 270 capsule 1   losartan (COZAAR) 50 MG tablet Take 1 tablet (50 mg total) by mouth daily. 90 tablet 3   mupirocin ointment (BACTROBAN) 2 % Apply topical to aa qd 22 g 3   naproxen sodium (ALEVE) 220 MG tablet Take 220 mg by  mouth daily as needed (pain).     ONETOUCH VERIO test strip 1 each by Other route daily. 100 each 3   FARXIGA 5 MG TABS tablet Take 1 tablet by mouth once daily 90 tablet 1   levothyroxine (SYNTHROID) 150 MCG tablet Take 1 tablet (150 mcg total) by mouth daily. (Patient taking differently: Take 125 mcg by mouth daily.) 90 tablet 3   B Complex Vitamins (B-COMPLEX/B-12) TABS      benzonatate (TESSALON PERLES) 100 MG capsule Take 1 capsule (100 mg total) by mouth 3 (three) times daily as needed. 20 capsule 0   fluconazole (DIFLUCAN) 150 MG tablet Take 1 tablet (150 mg total) by mouth every three (3) days as needed. 2 tablet 0   levofloxacin (LEVAQUIN) 500 MG tablet Take 1 tablet (500 mg total) by mouth daily. 5 tablet 0   No facility-administered medications prior to visit.    Allergies   Allergen Reactions   Eucalyptus Oil Shortness Of Breath    Other reaction(s): flush, cant breath   Haldol [Haloperidol Decanoate] Other (See Comments)    Drooling, weaving in floor, parkinson's syndrome (couldn't eat or talk) , pt was hospitalized    Molds & Smuts Shortness Of Breath and Itching   Other Anaphylaxis, Shortness Of Breath, Itching and Swelling    Bleu cheese    Penicillins Anaphylaxis and Rash    Pt was 61 years old  Has patient had a PCN reaction causing immediate rash, facial/tongue/throat swelling, SOB or lightheadedness with hypotension: Yes Has patient had a PCN reaction causing severe rash involving mucus membranes or skin necrosis: Unknown Has patient had a PCN reaction that required hospitalization: No Has patient had a PCN reaction occurring within the last 10 years: No If all of the above answers are "NO", then may proceed with Cephalosporin use.    Bee Venom Other (See Comments)    Yellow jackets and wasps-stung by 18, MD told pt that she wouldn't have resistance if stung again    Corn Oil     Other reaction(s): diarrhea, lots   Corn-Containing Products Nausea And Vomiting and Other (See Comments)    Stomach distress    Demeclocycline Other (See Comments)   Diflunisal Other (See Comments)    Upset stomach Other reaction(s): Unknown   Haloperidol Lactate     Other reaction(s): stumble around, droul   Hydrochlorothiazide     Other reaction(s): elevated lithium level   Hydrocodone-Acetaminophen Other (See Comments)    Other reaction(s): abd pain   Hydromorphone Other (See Comments)   Imipramine     Other reaction(s): rash   Metformin     Other reaction(s): stomach upset   Metformin And Related    Nitrofurantoin     Other reaction(s): rash   Penicillin G Other (See Comments)    Other reaction(s): rash   Sulfamethoxazole-Trimethoprim     Other reaction(s): rash   Tetracycline Hcl     Other reaction(s): rash   Vicodin [Hydrocodone-Acetaminophen]  Diarrhea and Other (See Comments)    Upset stomach   Cephalexin Rash    Other reaction(s): diarrhea   Doxycycline Rash    Other reaction(s): rash   Erythromycin Rash    Other reaction(s): rash   Macrodantin Rash   Motrin [Ibuprofen] Palpitations   Septra [Bactrim] Rash   Tetracyclines & Related Rash   Tofranil-Pm Rash   ROS neg/noncontributory except as noted HPI/below      Objective:     BP 136/82 (BP Location: Left Arm, Patient  Position: Sitting, Cuff Size: Large)   Pulse 67   Temp 97.9 F (36.6 C) (Temporal)   Ht 5' 7.5" (1.715 m)   Wt 249 lb 4 oz (113.1 kg)   LMP 07/30/2013   SpO2 96%   BMI 38.46 kg/m  Wt Readings from Last 3 Encounters:  02/10/22 249 lb 4 oz (113.1 kg)  01/17/22 250 lb 4 oz (113.5 kg)  01/02/22 246 lb 0.5 oz (111.6 kg)    Physical Exam   Gen: WDWN NAD HEENT: NCAT, conjunctiva not injected, sclera nonicteric NECK:  supple, no thyromegaly, no nodes, no carotid bruits CARDIAC: RRR, S1S2+, no murmur. DP 2+B LUNGS: CTAB. No wheezes ABDOMEN:  BS+, soft, NTND, No HSM, no masses EXT:  chronic edema-wearing compression stockings MSK: no gross abnormalities.  NEURO: A&O x3.  CN II-XII intact.  PSYCH: normal mood. Good eye contact  Discussed labs     Assessment & Plan:   Problem List Items Addressed This Visit       Cardiovascular and Mediastinum   HTN (hypertension) (Chronic)   Relevant Medications   rosuvastatin (CRESTOR) 20 MG tablet     Endocrine   Hypothyroidism - Primary (Chronic)   Relevant Medications   levothyroxine (SYNTHROID) 175 MCG tablet   Other Relevant Orders   TSH   Type 2 diabetes mellitus without complication, without long-term current use of insulin (HCC)   Relevant Medications   rosuvastatin (CRESTOR) 20 MG tablet   dapagliflozin propanediol (FARXIGA) 5 MG TABS tablet   Other Visit Diagnoses     Hyperlipidemia associated with type 2 diabetes mellitus (HCC)       Relevant Medications   rosuvastatin (CRESTOR)  20 MG tablet   dapagliflozin propanediol (FARXIGA) 5 MG TABS tablet   Other Relevant Orders   Lipid panel   Hepatic function panel   Other iron deficiency anemia       Relevant Orders   IBC + Ferritin      DM type 2-chronic.  Well controlled on farxiga '5mg'$  daily.  Pt working on diet/exercise.  At goal.  Continue HTN-chronic. Controlled on losartan '50mg'$  daily.  Continue Hypothyroidism-chronic.  Not ideal on 158mg.  Increase to 1761m/day and repeat TSH 2-79m67moD-new/chronic-restart crestor '20mg'$  daily and take coQ10 '100mg'$  daily.  Reck lipids/lft 2-3 mo.  If SE, let us [place] IDA-improved on iron, but stores still a little low-continue iron but 2x/wk.  Reck iron in 2-3 months.  F/u 105mo78mo prn  Meds ordered this encounter  Medications   rosuvastatin (CRESTOR) 20 MG tablet    Sig: Take 1 tablet (20 mg total) by mouth at bedtime.    Dispense:  90 tablet    Refill:  3   dapagliflozin propanediol (FARXIGA) 5 MG TABS tablet    Sig: Take 1 tablet (5 mg total) by mouth daily.    Dispense:  90 tablet    Refill:  3   levothyroxine (SYNTHROID) 175 MCG tablet    Sig: Take 1 tablet (175 mcg total) by mouth daily.    Dispense:  90 tablet    Refill:  1    Mry Lamia Wellington [place]

## 2022-02-10 NOTE — Patient Instructions (Addendum)
It was very nice to see you today!  coQ10 '100mg'$ /day vitamin.  Increase thyroid to 175/day-recheck 2-3 months Start crestor '20mg'$  in the evening and reck 2-3 months  Take iron 2 days/week for 3 months   PLEASE NOTE:  If you had any lab tests please let us know if you have not heard back within a few days. You may see your results on MyChart before we have a chance to review them but we will give you a call once they are reviewed by [place]. If we ordered any referrals today, please let us know if you have not heard from their office within the next week.   Please try these tips to maintain a healthy lifestyle:  Eat most of your calories during the day when you are active. Eliminate processed foods including packaged sweets (pies, cakes, cookies), reduce intake of potatoes, white bread, white pasta, and white rice. Look for whole grain options, oat flour or almond flour.  Each meal should contain half fruits/vegetables, one quarter protein, and one quarter carbs (no bigger than a computer mouse).  Cut down on sweet beverages. This includes juice, soda, and sweet tea. Also watch fruit intake, though this is a healthier sweet option, it still contains natural sugar! Limit to 3 servings daily.  Drink at least 1 glass of water with each meal and aim for at least 8 glasses per day  Exercise at least 150 minutes every week.

## 2022-04-16 ENCOUNTER — Encounter: Payer: Self-pay | Admitting: Family Medicine

## 2022-04-16 DIAGNOSIS — Z79899 Other long term (current) drug therapy: Secondary | ICD-10-CM

## 2022-04-23 ENCOUNTER — Other Ambulatory Visit: Payer: Commercial Managed Care - HMO

## 2022-04-30 ENCOUNTER — Encounter: Payer: Self-pay | Admitting: Family Medicine

## 2022-04-30 ENCOUNTER — Ambulatory Visit (INDEPENDENT_AMBULATORY_CARE_PROVIDER_SITE_OTHER): Payer: Medicaid Other | Admitting: Family Medicine

## 2022-04-30 VITALS — BP 140/78 | HR 86 | Temp 97.8°F | Ht 67.5 in | Wt 239.2 lb

## 2022-04-30 DIAGNOSIS — R0989 Other specified symptoms and signs involving the circulatory and respiratory systems: Secondary | ICD-10-CM | POA: Diagnosis not present

## 2022-04-30 DIAGNOSIS — J40 Bronchitis, not specified as acute or chronic: Secondary | ICD-10-CM | POA: Diagnosis not present

## 2022-04-30 DIAGNOSIS — R059 Cough, unspecified: Secondary | ICD-10-CM | POA: Diagnosis not present

## 2022-04-30 LAB — POCT INFLUENZA A/B
Influenza A, POC: NEGATIVE
Influenza B, POC: NEGATIVE

## 2022-04-30 LAB — POC COVID19 BINAXNOW: SARS Coronavirus 2 Ag: NEGATIVE

## 2022-04-30 MED ORDER — LITHIUM CARBONATE 300 MG PO CAPS: 900.0000 mg | ORAL_CAPSULE | Freq: Every day | ORAL | 0 refills | Status: DC

## 2022-04-30 MED ORDER — LEVOFLOXACIN 500 MG PO TABS: 500.0000 mg | ORAL_TABLET | Freq: Every day | ORAL | 0 refills | Status: AC

## 2022-04-30 NOTE — Progress Notes (Signed)
Subjective:     Patient ID: Leah Powers, female    DOB: [birth year], 62 y.o.   MRN: [id number]  Chief Complaint  Patient presents with   Cough    Ongoing deep cough, productive Sx started Friday Took Tussin DM   Nasal Congestion   Fever    Low grade fever    HPI Deep productive cough since 2/9.  Congested, low grade fever 99.8.  dizzy.  Some wheezing. Achey chest. No sob.  Judeth Cornfield has to do levaquin.    Health Maintenance Due  Topic Date Due   COLONOSCOPY (Pts 45-32yr Insurance coverage will need to be confirmed)  Never done    Past Medical History:  Diagnosis Date   Allergy    Arthritis    L hand, R ankle  (10/08/2016)   Bipolar 1 disorder ([facility]    Depression    Diabetes mellitus without complication ([facility]    Fibromyalgia    told that the breakout on the upper back , could be over active nerves , also treated with cream & Gabapentin- [facility] [place]    History of kidney stones    spontaneous passing   Hypertension    Hypothyroid    Pneumonia ~ 1967; 07/2016   Pyelonephritis 1997   treated with IV antibiotic     Past Surgical History:  Procedure Laterality Date   BLADDER INSTILLATION  X 2   CHALAZION EXCISION Bilateral    R- eye X2 , L eye- X1   FOOT ARTHRODESIS Right 10/08/2016   Procedure: RIGHT TRIPLE ARTHRODESIS;  Surgeon: DNewt Minion MD;  [facility] [facility]  Service: Orthopedics;  Laterality: Right;   FOOT ARTHRODESIS, TRIPLE Right 10/08/2016   ankle   FRENULECTOMY, LINGUAL  1970s   LAPAROSCOPIC CHOLECYSTECTOMY  1996   PALATE SURGERY  1970s   tissue removed from upper palate as a child    URETHRAL DILATION      Outpatient Medications Prior to Visit  Medication Sig Dispense Refill   albuterol (VENTOLIN HFA) 108 (90 Base) MCG/ACT inhaler Inhale 2 puffs into the lungs every 6 (six) hours as needed for wheezing or shortness of breath. 1 each 2   cetirizine (ZYRTEC) 5 MG tablet Take 5 mg by mouth 2 (two) times daily as needed for  allergies.      dapagliflozin propanediol (FARXIGA) 5 MG TABS tablet Take 1 tablet (5 mg total) by mouth daily. 90 tablet 3   docusate sodium (COLACE) 100 MG capsule Take 100 mg by mouth 3 (three) times daily.     Elastic Bandages & Supports (MEDICAL COMPRESSION SOCKS) MISC Wear compression stockings daily with upright activity. Remove at bedtime. 2 each 0   furosemide (LASIX) 20 MG tablet Take 1 tablet (20 mg total) by mouth daily. 90 tablet 3   gabapentin (NEURONTIN) 300 MG capsule Take as directed (3017m5 times a day) 450 capsule 4   hydrocortisone 2.5 % cream Apply topically.     ketoconazole (NIZORAL) 2 % shampoo Apply 1 Application topically 3 (three) times a week.     Lancets (ONETOUCH DELICA PLUS [id number] Inject 1 Stick into the skin daily. 100 each 2   levothyroxine (SYNTHROID) 175 MCG tablet Take 1 tablet (175 mcg total) by mouth daily. 90 tablet 1   lithium carbonate 300 MG capsule Take 3 capsules (900 mg total) by mouth at bedtime. 270 capsule 1   losartan (COZAAR) 50 MG tablet Take 1 tablet (50 mg total) by mouth  daily. 90 tablet 3   mupirocin ointment (BACTROBAN) 2 % Apply topical to aa qd 22 g 3   naproxen sodium (ALEVE) 220 MG tablet Take 220 mg by mouth daily as needed (pain).     ONETOUCH VERIO test strip 1 each by Other route daily. 100 each 3   rosuvastatin (CRESTOR) 20 MG tablet Take 1 tablet (20 mg total) by mouth at bedtime. 90 tablet 3   No facility-administered medications prior to visit.    Allergies  Allergen Reactions   Eucalyptus Oil Shortness Of Breath    Other reaction(s): flush, cant breath   Haldol [Haloperidol Decanoate] Other (See Comments)    Drooling, weaving in floor, parkinson's syndrome (couldn't eat or talk) , pt was hospitalized    Molds & Smuts Shortness Of Breath and Itching   Other Anaphylaxis, Shortness Of Breath, Itching and Swelling    Bleu cheese    Penicillins Anaphylaxis and Rash    Pt was 62 years old  Has patient had a PCN  reaction causing immediate rash, facial/tongue/throat swelling, SOB or lightheadedness with hypotension: Yes Has patient had a PCN reaction causing severe rash involving mucus membranes or skin necrosis: Unknown Has patient had a PCN reaction that required hospitalization: No Has patient had a PCN reaction occurring within the last 10 years: No If all of the above answers are "NO", then may proceed with Cephalosporin use.    Bee Venom Other (See Comments)    Yellow jackets and wasps-stung by 9, MD told pt that she wouldn't have resistance if stung again    Corn Oil     Other reaction(s): diarrhea, lots   Corn-Containing Products Nausea And Vomiting and Other (See Comments)    Stomach distress    Demeclocycline Other (See Comments)   Diflunisal Other (See Comments)    Upset stomach Other reaction(s): Unknown   Haloperidol Lactate     Other reaction(s): stumble around, droul   Hydrochlorothiazide     Other reaction(s): elevated lithium level   Hydrocodone-Acetaminophen Other (See Comments)    Other reaction(s): abd pain   Hydromorphone Other (See Comments)   Imipramine     Other reaction(s): rash   Metformin     Other reaction(s): stomach upset   Metformin And Related    Nitrofurantoin     Other reaction(s): rash   Penicillin G Other (See Comments)    Other reaction(s): rash   Sulfamethoxazole-Trimethoprim     Other reaction(s): rash   Tetracycline Hcl     Other reaction(s): rash   Vicodin [Hydrocodone-Acetaminophen] Diarrhea and Other (See Comments)    Upset stomach   Cephalexin Rash    Other reaction(s): diarrhea   Doxycycline Rash    Other reaction(s): rash   Erythromycin Rash    Other reaction(s): rash   Macrodantin Rash   Motrin [Ibuprofen] Palpitations   Septra [Bactrim] Rash   Tetracyclines & Related Rash   Tofranil-Pm Rash   ROS neg/noncontributory except as noted HPI/below      Objective:     BP (!) 140/78   Pulse 86   Temp 97.8 F (36.6 C)  (Temporal)   Ht 5' 7.5" (1.715 m)   Wt 239 lb 4 oz (108.5 kg)   LMP 07/30/2013   SpO2 94%   BMI 36.92 kg/m  Wt Readings from Last 3 Encounters:  04/30/22 239 lb 4 oz (108.5 kg)  02/10/22 249 lb 4 oz (113.1 kg)  01/17/22 250 lb 4 oz (113.5 kg)    Physical  Exam   Gen: WDWN NAD HEENT: NCAT, conjunctiva not injected, sclera nonicteric TM WNL B, OP moist, no exudates .  Some congestion NECK:  supple, no thyromegaly, no nodes, no carotid bruits CARDIAC: RRR, S1S2+, no murmur.  LUNGS: CTAB. No wheezes. congested EXT:  no edema MSK: no gross abnormalities.  NEURO: A&O x3.  CN II-XII intact.  PSYCH: normal mood. Good eye contact  Results for orders placed or performed in visit on 04/30/22  POC COVID-19  Result Value Ref Range   SARS Coronavirus 2 Ag Negative Negative  POCT Influenza A/B  Result Value Ref Range   Influenza A, POC Negative Negative   Influenza B, POC Negative Negative        Assessment & Plan:   Problem List Items Addressed This Visit   None Visit Diagnoses     Cough, unspecified type    -  Primary   Relevant Orders   POC COVID-19 (Completed)   POCT Influenza A/B (Completed)   Runny nose       Relevant Orders   POC COVID-19 (Completed)   POCT Influenza A/B (Completed)   Bronchitis          Bronchitis-mult allergies-levaquin 566m x 7d.  Albuterol MDI-use it.    No orders of the defined types were placed in this encounter.   AWellington Hampshire MD

## 2022-04-30 NOTE — Patient Instructions (Signed)
Lithium and levaquin sent to pharm   Use the albuterol inhaler  Worse, no change let us know

## 2022-05-03 ENCOUNTER — Encounter: Payer: Self-pay | Admitting: Family Medicine

## 2022-05-05 ENCOUNTER — Other Ambulatory Visit: Payer: Self-pay | Admitting: Family Medicine

## 2022-05-05 MED ORDER — PREDNISONE 20 MG PO TABS: 40.0000 mg | ORAL_TABLET | Freq: Every day | ORAL | 0 refills | Status: AC

## 2022-05-07 ENCOUNTER — Ambulatory Visit: Payer: Commercial Managed Care - HMO | Admitting: Physician Assistant

## 2022-05-08 ENCOUNTER — Other Ambulatory Visit: Payer: Self-pay | Admitting: *Deleted

## 2022-05-08 ENCOUNTER — Telehealth: Payer: Self-pay | Admitting: Family Medicine

## 2022-05-08 ENCOUNTER — Other Ambulatory Visit (INDEPENDENT_AMBULATORY_CARE_PROVIDER_SITE_OTHER): Payer: Medicaid Other

## 2022-05-08 DIAGNOSIS — D508 Other iron deficiency anemias: Secondary | ICD-10-CM

## 2022-05-08 DIAGNOSIS — E785 Hyperlipidemia, unspecified: Secondary | ICD-10-CM | POA: Diagnosis not present

## 2022-05-08 DIAGNOSIS — E038 Other specified hypothyroidism: Secondary | ICD-10-CM | POA: Diagnosis not present

## 2022-05-08 DIAGNOSIS — Z79899 Other long term (current) drug therapy: Secondary | ICD-10-CM | POA: Diagnosis not present

## 2022-05-08 DIAGNOSIS — E1169 Type 2 diabetes mellitus with other specified complication: Secondary | ICD-10-CM | POA: Diagnosis not present

## 2022-05-08 LAB — HEPATIC FUNCTION PANEL
ALT: 14 U/L (ref 0–35)
AST: 10 U/L (ref 0–37)
Albumin: 3.7 g/dL (ref 3.5–5.2)
Alkaline Phosphatase: 86 U/L (ref 39–117)
Bilirubin, Direct: 0.1 mg/dL (ref 0.0–0.3)
Total Bilirubin: 0.4 mg/dL (ref 0.2–1.2)
Total Protein: 5.8 g/dL — ABNORMAL LOW (ref 6.0–8.3)

## 2022-05-08 LAB — LIPID PANEL
Cholesterol: 115 mg/dL (ref 0–200)
HDL: 46.5 mg/dL (ref >39.00)
LDL Cholesterol: 44 mg/dL (ref 0–99)
NonHDL: 68.76
Total CHOL/HDL Ratio: 2
Triglycerides: 124 mg/dL (ref 0.0–149.0)
VLDL: 24.8 mg/dL (ref 0.0–40.0)

## 2022-05-08 LAB — IBC + FERRITIN
Ferritin: 20.8 ng/mL (ref 10.0–291.0)
Iron: 81 ug/dL (ref 42–145)
Saturation Ratios: 22.7 % (ref 20.0–50.0)
TIBC: 357 ug/dL (ref 250.0–450.0)
Transferrin: 255 mg/dL (ref 212.0–360.0)

## 2022-05-08 LAB — TSH: TSH: 0.17 u[IU]/mL — ABNORMAL LOW (ref 0.35–5.50)

## 2022-05-08 MED ORDER — LEVOFLOXACIN 500 MG PO TABS: 500.0000 mg | ORAL_TABLET | Freq: Every day | ORAL | 0 refills | Status: DC

## 2022-05-08 NOTE — Telephone Encounter (Signed)
Per pcp: 7 days of Levaquin sent to the pharmacy. Once completed, if patient isn't any better, patient is to see pulmonary. Patient notified and verbalized understanding.

## 2022-05-08 NOTE — Telephone Encounter (Signed)
Please see message below and advise.

## 2022-05-08 NOTE — Telephone Encounter (Signed)
Pt is here for blood work and she wanted to know if she can get some more antibiotics because she was RXed some at last appt and it was not enough, levofloxacin (LEVAQUIN) 500 MG tablet Please advise.

## 2022-05-09 LAB — LITHIUM LEVEL: Lithium Lvl: 0.6 mmol/L (ref 0.6–1.2)

## 2022-05-11 NOTE — Progress Notes (Signed)
1.  Continue taking iron twice weekly 2.  Cholesterol is really good on the Crestor.  We can continue this dose, do half tablet, or change to the 10 mg tablets 3.  Thyroid dose is too high-we can try skipping the dose 1 day/week, or change to 167 mcg/day

## 2022-05-12 ENCOUNTER — Encounter: Payer: Self-pay | Admitting: Family Medicine

## 2022-05-13 ENCOUNTER — Other Ambulatory Visit: Payer: Self-pay | Admitting: *Deleted

## 2022-05-13 MED ORDER — ROSUVASTATIN CALCIUM 10 MG PO TABS: 10.0000 mg | ORAL_TABLET | Freq: Every day | ORAL | 3 refills | Status: DC

## 2022-05-15 ENCOUNTER — Encounter: Payer: Self-pay | Admitting: Podiatry

## 2022-05-15 ENCOUNTER — Ambulatory Visit (INDEPENDENT_AMBULATORY_CARE_PROVIDER_SITE_OTHER): Payer: Medicaid Other | Admitting: Podiatry

## 2022-05-15 DIAGNOSIS — D2371 Other benign neoplasm of skin of right lower limb, including hip: Secondary | ICD-10-CM | POA: Diagnosis not present

## 2022-05-15 DIAGNOSIS — E1140 Type 2 diabetes mellitus with diabetic neuropathy, unspecified: Secondary | ICD-10-CM

## 2022-05-15 DIAGNOSIS — B351 Tinea unguium: Secondary | ICD-10-CM | POA: Diagnosis not present

## 2022-05-15 DIAGNOSIS — M79676 Pain in unspecified toe(s): Secondary | ICD-10-CM | POA: Diagnosis not present

## 2022-05-15 DIAGNOSIS — D2372 Other benign neoplasm of skin of left lower limb, including hip: Secondary | ICD-10-CM | POA: Diagnosis not present

## 2022-05-15 DIAGNOSIS — E1149 Type 2 diabetes mellitus with other diabetic neurological complication: Secondary | ICD-10-CM | POA: Diagnosis not present

## 2022-05-15 NOTE — Progress Notes (Signed)
She presents today a patient of Dr. Mellody Drown for chief complaint of painful elongated toenails.  Objective: Vital signs are stable oriented x 3 hammertoe deformities resulting in distal clavi bilaterally she also has long thick yellow dystrophic onychomycotic nails painful palpation.  No open lesions or wounds are noted.  Assessment: Hammertoe deformities distal clavi benign skin lesions and pain in limb secondary to onychomycosis.  Plan: Debrided toenails 1 through 5 bilateral covered service secondary to pain.  Debrided benign skin lesions bilateral.

## 2022-05-18 ENCOUNTER — Ambulatory Visit
Admission: EM | Admit: 2022-05-18 | Discharge: 2022-05-18 | Disposition: A | Discharge status: Home / Self Care | Payer: Medicaid Other | Attending: Urgent Care | Admitting: Urgent Care

## 2022-05-18 DIAGNOSIS — L089 Local infection of the skin and subcutaneous tissue, unspecified: Secondary | ICD-10-CM

## 2022-05-18 DIAGNOSIS — B001 Herpesviral vesicular dermatitis: Secondary | ICD-10-CM

## 2022-05-18 MED ORDER — CLINDAMYCIN HCL 300 MG PO CAPS: 300.0000 mg | ORAL_CAPSULE | Freq: Three times a day (TID) | ORAL | 0 refills | Status: DC

## 2022-05-18 MED ORDER — VALACYCLOVIR HCL 1 G PO TABS: ORAL_TABLET | ORAL | 0 refills | Status: DC

## 2022-05-18 NOTE — ED Provider Notes (Signed)
[facility]  Note:  This document was prepared using Systems analyst and may include unintentional dictation errors.  MRN: [id number] DOB: November 20, 1960  Subjective:   Leah Powers is a 62 y.o. female presenting for 4-day history of acute onset persistent and worsening third right toe pain, redness and swelling.  Patient had her toenails clipped at the [facility] and her symptoms started thereafter.  She has been using ointment without any kind of relief.  She did have a blister that popped and is resolving.  No current facility-administered medications for this encounter.  Current Outpatient Medications:    albuterol (VENTOLIN HFA) 108 (90 Base) MCG/ACT inhaler, Inhale 2 puffs into the lungs every 6 (six) hours as needed for wheezing or shortness of breath., Disp: 1 each, Rfl: 2   cetirizine (ZYRTEC) 5 MG tablet, Take 5 mg by mouth 2 (two) times daily as needed for allergies. , Disp: , Rfl:    dapagliflozin propanediol (FARXIGA) 5 MG TABS tablet, Take 1 tablet (5 mg total) by mouth daily., Disp: 90 tablet, Rfl: 3   docusate sodium (COLACE) 100 MG capsule, Take 100 mg by mouth 3 (three) times daily., Disp: , Rfl:    Elastic Bandages & Supports (MEDICAL COMPRESSION SOCKS) MISC, Wear compression stockings daily with upright activity. Remove at bedtime., Disp: 2 each, Rfl: 0   furosemide (LASIX) 20 MG tablet, Take 1 tablet (20 mg total) by mouth daily., Disp: 90 tablet, Rfl: 3   gabapentin (NEURONTIN) 300 MG capsule, Take as directed ('300mg'$  5 times a day), Disp: 450 capsule, Rfl: 4   hydrocortisone 2.5 % cream, Apply topically., Disp: , Rfl:    ketoconazole (NIZORAL) 2 % shampoo, Apply 1 Application topically 3 (three) times a week., Disp: , Rfl:    Lancets (ONETOUCH DELICA PLUS Q000111Q) MISC, Inject 1 Stick into the skin daily., Disp: 100 each, Rfl: 2   lithium carbonate 300 MG capsule, Take 3 capsules (900 mg total) by mouth at bedtime., Disp:  270 capsule, Rfl: 0   losartan (COZAAR) 50 MG tablet, Take 1 tablet (50 mg total) by mouth daily., Disp: 90 tablet, Rfl: 3   mupirocin ointment (BACTROBAN) 2 %, Apply topical to aa qd, Disp: 22 g, Rfl: 3   naproxen sodium (ALEVE) 220 MG tablet, Take 220 mg by mouth daily as needed (pain)., Disp: , Rfl:    ONETOUCH VERIO test strip, 1 each by Other route daily., Disp: 100 each, Rfl: 3   rosuvastatin (CRESTOR) 10 MG tablet, Take 1 tablet (10 mg total) by mouth daily., Disp: 90 tablet, Rfl: 3   Allergies  Allergen Reactions   Eucalyptus Oil Shortness Of Breath    Other reaction(s): flush, cant breath   Haldol [Haloperidol Decanoate] Other (See Comments)    Drooling, weaving in floor, parkinson's syndrome (couldn't eat or talk) , pt was hospitalized    Molds & Smuts Shortness Of Breath and Itching   Other Anaphylaxis, Shortness Of Breath, Itching and Swelling    Bleu cheese    Penicillins Anaphylaxis and Rash    Pt was 62 years old  Has patient had a PCN reaction causing immediate rash, facial/tongue/throat swelling, SOB or lightheadedness with hypotension: Yes Has patient had a PCN reaction causing severe rash involving mucus membranes or skin necrosis: Unknown Has patient had a PCN reaction that required hospitalization: No Has patient had a PCN reaction occurring within the last 10 years: No If all of the above answers are "  NO", then may proceed with Cephalosporin use.    Bee Venom Other (See Comments)    Yellow jackets and wasps-stung by 73, MD told pt that she wouldn't have resistance if stung again    Corn Oil     Other reaction(s): diarrhea, lots   Corn-Containing Products Nausea And Vomiting and Other (See Comments)    Stomach distress    Demeclocycline Other (See Comments)   Diflunisal Other (See Comments)    Upset stomach Other reaction(s): Unknown   Haloperidol Lactate     Other reaction(s): stumble around, droul   Hydrochlorothiazide     Other reaction(s): elevated lithium  level   Hydrocodone-Acetaminophen Other (See Comments)    Other reaction(s): abd pain   Hydromorphone Other (See Comments)   Imipramine     Other reaction(s): rash   Metformin     Other reaction(s): stomach upset   Metformin And Related    Nitrofurantoin     Other reaction(s): rash   Penicillin G Other (See Comments)    Other reaction(s): rash   Sulfamethoxazole-Trimethoprim     Other reaction(s): rash   Tetracycline Hcl     Other reaction(s): rash   Vicodin [Hydrocodone-Acetaminophen] Diarrhea and Other (See Comments)    Upset stomach   Cephalexin Rash    Other reaction(s): diarrhea   Doxycycline Rash    Other reaction(s): rash   Erythromycin Rash    Other reaction(s): rash   Macrodantin Rash   Motrin [Ibuprofen] Palpitations   Septra [Bactrim] Rash   Tetracyclines & Related Rash   Tofranil-Pm Rash    Past Medical History:  Diagnosis Date   Allergy    Arthritis    L hand, R ankle  (10/08/2016)   Bipolar 1 disorder ([place])    Depression    Diabetes mellitus without complication ([place])    Fibromyalgia    told that the breakout on the upper back , could be over active nerves , also treated with cream & Gabapentin- [facility] [place]    History of kidney stones    spontaneous passing   Hypertension    Hypothyroid    Pneumonia ~ 1967; 07/2016   Pyelonephritis 1997   treated with IV antibiotic      Past Surgical History:  Procedure Laterality Date   BLADDER INSTILLATION  X 2   CHALAZION EXCISION Bilateral    R- eye X2 , L eye- X1   FOOT ARTHRODESIS Right 10/08/2016   Procedure: RIGHT TRIPLE ARTHRODESIS;  Surgeon: Newt Minion, MD;  Location: [facility];  Service: Orthopedics;  Laterality: Right;   FOOT ARTHRODESIS, TRIPLE Right 10/08/2016   ankle   FRENULECTOMY, LINGUAL  1970s   LAPAROSCOPIC CHOLECYSTECTOMY  1996   PALATE SURGERY  1970s   tissue removed from upper palate as a child    URETHRAL DILATION      Family History  Problem Relation Age of  Onset   Sleep apnea Father    Hypertension Father    Diabetes Sister    Hypertension Sister    Asthma Sister    Breast cancer Maternal Grandmother 59   Cancer Paternal Grandfather    Breast cancer Maternal Aunt 65    Social History   Tobacco Use   Smoking status: Never   Smokeless tobacco: Never  Vaping Use   Vaping Use: Never used  Substance Use Topics   Alcohol use: No   Drug use: No    ROS   Objective:   Vitals: BP Marland Kitchen)  140/77 (BP Location: Left Arm)   Pulse 71   Temp 98.4 F (36.9 C) (Oral)   Resp 20   LMP 07/30/2013   SpO2 96%   Physical Exam Constitutional:      General: She is not in acute distress.    Appearance: Normal appearance. She is well-developed. She is not ill-appearing, toxic-appearing or diaphoretic.  HENT:     Head: Normocephalic and atraumatic.     Nose: Nose normal.     Mouth/Throat:     Mouth: Mucous membranes are moist.  Eyes:     General: No scleral icterus.       Right eye: No discharge.        Left eye: No discharge.     Extraocular Movements: Extraocular movements intact.  Cardiovascular:     Rate and Rhythm: Normal rate.  Pulmonary:     Effort: Pulmonary effort is normal.  Musculoskeletal:       Feet:  Skin:    General: Skin is warm and dry.  Neurological:     General: No focal deficit present.     Mental Status: She is alert and oriented to person, place, and time.  Psychiatric:        Mood and Affect: Mood normal.        Behavior: Behavior normal.     Assessment and Plan :   PDMP not reviewed this encounter.  1. Toe infection   2. Fever blister     No evidence of paronychia.  Low suspicion for osteomyelitis.  Recommended clindamycin given her medication allergies.  She is previously taking this well.  Use warm soaks.  Follow-up with the podiatrist soon as possible. Counseled patient on potential for adverse effects with medications prescribed/recommended today, ER and return-to-clinic precautions discussed,  patient verbalized understanding.    Jaynee Eagles, [place] 05/18/22 1530

## 2022-05-18 NOTE — Discharge Instructions (Addendum)
Start clindamycin for a secondary toe infection. Follow up with the podiatrist as soon as possible. Use warm soapy soaks 5-10 minutes at a time 3 times daily as your time allows.   You can use valacyclovir for the fever blister.

## 2022-05-18 NOTE — ED Triage Notes (Signed)
Pt c/o redness/pain to right 3rd toe-states first noticed redness on 2/29 while at [facility] having toenails clipped-states they placed ointment-NAD-limping gait with own cane

## 2022-05-19 ENCOUNTER — Encounter: Payer: Self-pay | Admitting: Podiatry

## 2022-05-21 ENCOUNTER — Encounter: Payer: Self-pay | Admitting: Family Medicine

## 2022-05-22 ENCOUNTER — Other Ambulatory Visit: Payer: Self-pay | Admitting: Family Medicine

## 2022-05-22 ENCOUNTER — Ambulatory Visit: Payer: Medicaid Other | Admitting: Podiatry

## 2022-05-22 ENCOUNTER — Encounter: Payer: Self-pay | Admitting: Podiatry

## 2022-05-22 DIAGNOSIS — L97501 Non-pressure chronic ulcer of other part of unspecified foot limited to breakdown of skin: Secondary | ICD-10-CM

## 2022-05-22 MED ORDER — CLINDAMYCIN HCL 300 MG PO CAPS: 300.0000 mg | ORAL_CAPSULE | Freq: Three times a day (TID) | ORAL | 0 refills | Status: DC

## 2022-05-22 MED ORDER — RYBELSUS 3 MG PO TABS: 3.0000 mg | ORAL_TABLET | Freq: Every day | ORAL | 0 refills | Status: DC

## 2022-05-22 MED ORDER — RYBELSUS 7 MG PO TABS: 7.0000 mg | ORAL_TABLET | Freq: Every day | ORAL | 1 refills | Status: DC

## 2022-05-22 NOTE — Progress Notes (Signed)
She presents today for follow-up of her visit to urgent care where she had to be put on clindamycin because of infection to her third digit of her right foot.  This possibly came about from her debridement of her toenails or the debridement of the distal clavus.  Nonetheless she states that she is doing much better no fever chills nausea vomit muscle aches or pains.  Just about finished up her clindamycin.  Objective: Vital signs stable oriented x 3 there is no erythema cellulitis drainage or odor in all of the toes with the exception of the third digit of the right foot which does demonstrate some erythema at the proximal nail fold no purulence no malodor distal clavus does not demonstrate any ulcerative lesions.  Assessment: Hammertoe deformities with cellulitis DIPJ and third digit right foot.  Plan: We will refill her clindamycin I gave her to buttress pads once for each foot to help hold the toes off and I will follow-up with her in a couple of weeks.

## 2022-05-29 ENCOUNTER — Other Ambulatory Visit: Payer: Self-pay | Admitting: Family Medicine

## 2022-05-29 MED ORDER — SEMAGLUTIDE(0.25 OR 0.5MG/DOS) 2 MG/3ML ~~LOC~~ SOPN: PEN_INJECTOR | SUBCUTANEOUS | 1 refills | Status: DC

## 2022-05-29 NOTE — Progress Notes (Signed)
Ins not cover rybelsus.  Pt willing to do ozempic

## 2022-06-03 DIAGNOSIS — F319 Bipolar disorder, unspecified: Secondary | ICD-10-CM | POA: Diagnosis not present

## 2022-06-05 ENCOUNTER — Ambulatory Visit: Payer: Medicaid Other | Admitting: Podiatry

## 2022-06-05 DIAGNOSIS — D2372 Other benign neoplasm of skin of left lower limb, including hip: Secondary | ICD-10-CM

## 2022-06-05 DIAGNOSIS — D492 Neoplasm of unspecified behavior of bone, soft tissue, and skin: Secondary | ICD-10-CM | POA: Diagnosis not present

## 2022-06-05 DIAGNOSIS — M7989 Other specified soft tissue disorders: Secondary | ICD-10-CM

## 2022-06-05 DIAGNOSIS — D2371 Other benign neoplasm of skin of right lower limb, including hip: Secondary | ICD-10-CM

## 2022-06-05 NOTE — Progress Notes (Signed)
She presents today for follow-up of her ulceration distal aspect of the toes bilaterally.  States that they are doing much better and the crest pads really help.  Objective: Vital signs stable alert oriented x 3 there is no erythema edema salines drainage or odor distal clavi are still present ulcerations have gone on to heal uneventfully.  Assessment: Hammertoe deformities with distal clavus and ulcerative lesions that have gone on to heal.  Plan: Continue the use of the sulcus pads or crest pads silicone.  Also debrided the reactive hyperkeratotic tissue.

## 2022-06-11 ENCOUNTER — Encounter: Payer: Self-pay | Admitting: Family Medicine

## 2022-06-18 ENCOUNTER — Encounter: Payer: Self-pay | Admitting: Family Medicine

## 2022-06-18 ENCOUNTER — Other Ambulatory Visit: Payer: Self-pay | Admitting: Family Medicine

## 2022-06-18 DIAGNOSIS — R202 Paresthesia of skin: Secondary | ICD-10-CM

## 2022-06-18 MED ORDER — GABAPENTIN 300 MG PO CAPS: ORAL_CAPSULE | ORAL | 4 refills | Status: DC

## 2022-07-02 ENCOUNTER — Encounter: Payer: Self-pay | Admitting: Physician Assistant

## 2022-07-02 ENCOUNTER — Ambulatory Visit (INDEPENDENT_AMBULATORY_CARE_PROVIDER_SITE_OTHER): Payer: Medicaid Other | Admitting: Physician Assistant

## 2022-07-02 DIAGNOSIS — F319 Bipolar disorder, unspecified: Secondary | ICD-10-CM

## 2022-07-02 DIAGNOSIS — E032 Hypothyroidism due to medicaments and other exogenous substances: Secondary | ICD-10-CM | POA: Diagnosis not present

## 2022-07-02 DIAGNOSIS — Z79899 Other long term (current) drug therapy: Secondary | ICD-10-CM | POA: Diagnosis not present

## 2022-07-02 MED ORDER — LITHIUM CARBONATE 300 MG PO CAPS: 900.0000 mg | ORAL_CAPSULE | Freq: Every day | ORAL | 1 refills | Status: DC

## 2022-07-02 NOTE — Progress Notes (Signed)
[facility]  Patient ID: Leah Powers,  MRN: [id number]  PCP: Jeani Sow, MD  Date of Evaluation: 07/02/2022 Time spent:30 minutes  Chief Complaint:  Chief Complaint   Follow-up    HISTORY/CURRENT STATUS: HPI for routine med check.  Wasn't able to find a new psych med provider, her insurance wasn't covering our office, until the past month, so she returned for continuation of care.  Mood has been stable. Patient is able to enjoy things.  Energy and motivation are good.  No extreme sadness, tearfulness, or feelings of hopelessness.  Sleeps well most of the time. ADLs and personal hygiene are normal.   Denies any changes in concentration, making decisions, or remembering things.  Appetite has not changed.  Weight is stable.  Not having a lot of anxiety, except feels more agitated since Levothyroxine dose was increased.  Denies suicidal or homicidal thoughts.  Patient denies increased energy with decreased need for sleep, increased talkativeness, racing thoughts, impulsivity or risky behaviors, increased spending, increased libido, grandiosity, increased irritability or anger, paranoia, or hallucinations.  Denies dizziness, syncope, seizures, numbness, tingling, tremor, tics, unsteady gait, slurred speech, confusion. Denies muscle or joint pain, stiffness, or dystonia. Denies unexplained weight loss, frequent infections, or sores that heal slowly.  No polyphagia, polydipsia, or polyuria. Denies visual changes or paresthesias.   Individual Medical History/ Review of Systems: Changes? :Yes    had labs in Feb.  Patient states her levothyroxine dose was increased.  She is not sure what dose she is on though.  Past medications for mental health diagnoses include: Prolixin, Haldol, Ativan, lithium, Xanax, Synthroid, gabapentin  Allergies: Eucalyptus oil, Haldol [haloperidol decanoate], Molds & smuts, Other, Penicillins, Bee venom, Corn oil, Corn-containing products,  Demeclocycline, Diflunisal, Haloperidol lactate, Hydrochlorothiazide, Hydrocodone-acetaminophen, Hydromorphone, Imipramine, Metformin, Metformin and related, Nitrofurantoin, Penicillin g, Sulfamethoxazole-trimethoprim, Tetracycline hcl, Vicodin [hydrocodone-acetaminophen], Cephalexin, Doxycycline, Erythromycin, Macrodantin, Motrin [ibuprofen], Septra [bactrim], Tetracyclines & related, and Tofranil-pm  Current Medications:  Current Outpatient Medications:    albuterol (VENTOLIN HFA) 108 (90 Base) MCG/ACT inhaler, Inhale 2 puffs into the lungs every 6 (six) hours as needed for wheezing or shortness of breath., Disp: 1 each, Rfl: 2   cetirizine (ZYRTEC) 5 MG tablet, Take 5 mg by mouth 2 (two) times daily as needed for allergies. , Disp: , Rfl:    dapagliflozin propanediol (FARXIGA) 5 MG TABS tablet, Take 1 tablet (5 mg total) by mouth daily., Disp: 90 tablet, Rfl: 3   docusate sodium (COLACE) 100 MG capsule, Take 100 mg by mouth 3 (three) times daily., Disp: , Rfl:    Elastic Bandages & Supports (MEDICAL COMPRESSION SOCKS) MISC, Wear compression stockings daily with upright activity. Remove at bedtime., Disp: 2 each, Rfl: 0   furosemide (LASIX) 20 MG tablet, Take 1 tablet (20 mg total) by mouth daily., Disp: 90 tablet, Rfl: 3   gabapentin (NEURONTIN) 300 MG capsule, Take as directed (  5 times a day), Disp: 450 capsule, Rfl: 4   hydrocortisone 2.5 % cream, Apply topically., Disp: , Rfl:    ketoconazole (NIZORAL) 2 % shampoo, Apply 1 Application topically 3 (three) times a week., Disp: , Rfl:    Lancets (ONETOUCH DELICA PLUS LANCET30G) MISC, Inject 1 Stick into the skin daily., Disp: 100 each, Rfl: 2   losartan (COZAAR) 50 MG tablet, Take 1 tablet (50 mg total) by mouth daily., Disp: 90 tablet, Rfl: 3   mupirocin ointment (BACTROBAN) 2 %, Apply topical to aa qd, Disp: 22 g, Rfl: 3   ONETOUCH  VERIO test strip, 1 each by Other route daily., Disp: 100 each, Rfl: 3   rosuvastatin (CRESTOR) 10 MG  tablet, Take 1 tablet (10 mg total) by mouth daily., Disp: 90 tablet, Rfl: 3   Semaglutide,0.25 or 0.5MG /DOS, 2 MG/3ML SOPN, Inject 0.25 mg into the skin once a week for 28 days, THEN 0.5 mg once a week for 28 days., Disp: 3 mL, Rfl: 1   valACYclovir (VALTREX) 1000 MG tablet, At the start of an outbreak take 1 tablet daily for 5 days., Disp: 30 tablet, Rfl: 0   clindamycin (CLEOCIN) 300 MG capsule, Take 1 capsule (300 mg total) by mouth 3 (three) times daily., Disp: 30 capsule, Rfl: 0   lithium carbonate 300 MG capsule, Take 3 capsules (900 mg total) by mouth at bedtime., Disp: 270 capsule, Rfl: 1   naproxen sodium (ALEVE) 220 MG tablet, Take 220 mg by mouth daily as needed (pain)., Disp: , Rfl:  Medication Side Effects: none  Family Medical/ Social History: Changes? no  MENTAL HEALTH EXAM:  Last menstrual period 07/30/2013.There is no height or weight on file to calculate BMI.  General Appearance: Casual, Well Groomed, and Obese  Eye Contact:  Good  Speech:  Clear and Coherent, Normal Rate, Talkative, and giggly which is normal for her  Volume:  Normal  Mood:  Euthymic  Affect:  Congruent  Thought Process:  Goal Directed and Descriptions of Associations: Circumstantial  Orientation:  Full (Time, Place, and Person)  Thought Content: Logical   Suicidal Thoughts:  No  Homicidal Thoughts:  No  Memory:  WNL  Judgement:  Good  Insight:  Good  Psychomotor Activity:  Normal  Concentration:  Concentration: Good and Attention Span: Good  Recall:  Good  Fund of Knowledge: Good  Language: Good  Assets:  Desire for Improvement Financial Resources/Insurance Housing Transportation  ADL's:  Intact  Cognition: WNL  Prognosis:  Good   Labs 05/08/2022 Lithium 0.6 TSH 0.17 Lipids TC 115, Trig 124, HDL 46.5, LDL 44 LFTs wnl IBC and ferritin wnl  DIAGNOSES:    ICD-10-CM   1. Bipolar I disorder  F31.9 TSH    Comprehensive metabolic panel    2. Hypothyroidism due to medication  E03.2 TSH     3. Encounter for long-term (current) use of medications  Z79.899 TSH    Comprehensive metabolic panel      Receiving Psychotherapy: No   RECOMMENDATIONS:  PDMP was reviewed.  Gabapentin filled for 05/18/2022. I provided 30 minutes of face to face time during this encounter, including time spent before and after the visit in records review, medical decision making, counseling pertinent to today's visit, and charting.   There is confusion on the current dose of levothyroxine.  That med was not even on the medication list.  She asks if she is on the correct dose, I cannot answer that without knowing what she is taking.  She will call the office back to give me the dose.  I am also ordering a TSH, checking a CMP especially for renal function and calcium level.  Continue gabapentin 300 mg,  2 po q am, 1 mid-day, 2 po qhs.  Per another provider.   Continue levothyroxine ? Dose, every morning.  Per her PCP. Continue lithium 300 mg, 3 p.o. nightly. Return in 5-6 months.   Melony Overly, PA-C

## 2022-07-03 ENCOUNTER — Telehealth: Payer: Self-pay | Admitting: Physician Assistant

## 2022-07-03 NOTE — Telephone Encounter (Signed)
Pt called back after apt reporting Synthroid is 150 mcg tablet was started after blood work 05/13/22. February 10, 2022 Rx was 175 (may have been too much)  Hope this helps. Pt said, looking at [facility]. [phone] pt contact

## 2022-07-03 NOTE — Telephone Encounter (Signed)
Noted. She has the lab orders so will wait for those results and decide what her dose should be.

## 2022-07-08 DIAGNOSIS — Z79899 Other long term (current) drug therapy: Secondary | ICD-10-CM | POA: Diagnosis not present

## 2022-07-08 DIAGNOSIS — E032 Hypothyroidism due to medicaments and other exogenous substances: Secondary | ICD-10-CM | POA: Diagnosis not present

## 2022-07-08 DIAGNOSIS — F319 Bipolar disorder, unspecified: Secondary | ICD-10-CM | POA: Diagnosis not present

## 2022-07-09 ENCOUNTER — Other Ambulatory Visit: Payer: Self-pay | Admitting: Physician Assistant

## 2022-07-09 ENCOUNTER — Encounter: Payer: Self-pay | Admitting: Family Medicine

## 2022-07-09 ENCOUNTER — Other Ambulatory Visit: Payer: Self-pay

## 2022-07-09 DIAGNOSIS — E032 Hypothyroidism due to medicaments and other exogenous substances: Secondary | ICD-10-CM

## 2022-07-09 DIAGNOSIS — Z79899 Other long term (current) drug therapy: Secondary | ICD-10-CM

## 2022-07-09 LAB — COMPREHENSIVE METABOLIC PANEL
ALT: 19 IU/L (ref 0–32)
AST: 19 IU/L (ref 0–40)
Albumin/Globulin Ratio: 2 (ref 1.2–2.2)
Albumin: 4 g/dL (ref 3.9–4.9)
Alkaline Phosphatase: 107 IU/L (ref 44–121)
BUN/Creatinine Ratio: 24 (ref 12–28)
BUN: 16 mg/dL (ref 8–27)
Bilirubin Total: 0.2 mg/dL (ref 0.0–1.2)
CO2: 23 mmol/L (ref 20–29)
Calcium: 9.6 mg/dL (ref 8.7–10.3)
Chloride: 109 mmol/L — ABNORMAL HIGH (ref 96–106)
Creatinine, Ser: 0.68 mg/dL (ref 0.57–1.00)
Globulin, Total: 2 g/dL (ref 1.5–4.5)
Glucose: 113 mg/dL — ABNORMAL HIGH (ref 70–99)
Potassium: 4.3 mmol/L (ref 3.5–5.2)
Sodium: 144 mmol/L (ref 134–144)
Total Protein: 6 g/dL (ref 6.0–8.5)
eGFR: 99 mL/min/{1.73_m2} (ref >59)

## 2022-07-09 LAB — TSH: TSH: 0.397 u[IU]/mL — ABNORMAL LOW (ref 0.450–4.500)

## 2022-07-09 MED ORDER — LEVOTHYROXINE SODIUM 125 MCG PO TABS: 125.0000 ug | ORAL_TABLET | Freq: Every day | ORAL | 1 refills | Status: DC

## 2022-07-09 NOTE — Progress Notes (Signed)
I just ordered the lab so it should have printed for you. Thanks.

## 2022-07-09 NOTE — Progress Notes (Signed)
CMP is within normal limits.  Kidney functions are good.  TSH is still low so need to decrease the levothyroxine from 150 mcg every morning down to 125 mcg every morning and repeat a TSH in 4 to 6 weeks.  Let me know which pharmacy to send that to and I will do that, I will also order the lab, please mail it to her thank you.

## 2022-07-30 ENCOUNTER — Encounter: Payer: Self-pay | Admitting: Family Medicine

## 2022-07-30 ENCOUNTER — Ambulatory Visit (INDEPENDENT_AMBULATORY_CARE_PROVIDER_SITE_OTHER): Payer: Medicaid Other | Admitting: Family Medicine

## 2022-07-30 VITALS — BP 130/78 | HR 77 | Temp 98.1°F | Resp 18 | Ht 67.5 in | Wt 241.5 lb

## 2022-07-30 DIAGNOSIS — E038 Other specified hypothyroidism: Secondary | ICD-10-CM

## 2022-07-30 DIAGNOSIS — F319 Bipolar disorder, unspecified: Secondary | ICD-10-CM | POA: Diagnosis not present

## 2022-07-30 DIAGNOSIS — E119 Type 2 diabetes mellitus without complications: Secondary | ICD-10-CM | POA: Diagnosis not present

## 2022-07-30 DIAGNOSIS — J4520 Mild intermittent asthma, uncomplicated: Secondary | ICD-10-CM | POA: Diagnosis not present

## 2022-07-30 DIAGNOSIS — L280 Lichen simplex chronicus: Secondary | ICD-10-CM | POA: Diagnosis not present

## 2022-07-30 DIAGNOSIS — Z7985 Long-term (current) use of injectable non-insulin antidiabetic drugs: Secondary | ICD-10-CM

## 2022-07-30 DIAGNOSIS — I10 Essential (primary) hypertension: Secondary | ICD-10-CM | POA: Diagnosis not present

## 2022-07-30 DIAGNOSIS — R600 Localized edema: Secondary | ICD-10-CM | POA: Diagnosis not present

## 2022-07-30 DIAGNOSIS — E559 Vitamin D deficiency, unspecified: Secondary | ICD-10-CM | POA: Diagnosis not present

## 2022-07-30 LAB — MICROALBUMIN / CREATININE URINE RATIO
Creatinine,U: 26.4 mg/dL
Microalb Creat Ratio: 2.7 mg/g (ref 0.0–30.0)
Microalb, Ur: <0.7 mg/dL (ref 0.0–1.9)

## 2022-07-30 LAB — VITAMIN D 25 HYDROXY (VIT D DEFICIENCY, FRACTURES): VITD: 11.48 ng/mL — ABNORMAL LOW (ref 30.00–100.00)

## 2022-07-30 LAB — HEMOGLOBIN A1C: Hgb A1c MFr Bld: 6.2 % (ref 4.6–6.5)

## 2022-07-30 MED ORDER — FUROSEMIDE 20 MG PO TABS: 20.0000 mg | ORAL_TABLET | Freq: Every day | ORAL | 3 refills | Status: DC

## 2022-07-30 MED ORDER — HYDROCORTISONE 2.5 % EX CREA: TOPICAL_CREAM | Freq: Two times a day (BID) | CUTANEOUS | 1 refills | Status: DC

## 2022-07-30 MED ORDER — MUPIROCIN 2 % EX OINT: TOPICAL_OINTMENT | CUTANEOUS | 3 refills | Status: DC

## 2022-07-30 MED ORDER — SEMAGLUTIDE (1 MG/DOSE) 4 MG/3ML ~~LOC~~ SOPN: 1.0000 mg | PEN_INJECTOR | SUBCUTANEOUS | 0 refills | Status: DC

## 2022-07-30 MED ORDER — ALBUTEROL SULFATE HFA 108 (90 BASE) MCG/ACT IN AERS: 2.0000 | INHALATION_SPRAY | Freq: Four times a day (QID) | RESPIRATORY_TRACT | 2 refills | Status: AC | PRN

## 2022-07-30 MED ORDER — LOSARTAN POTASSIUM 50 MG PO TABS: 50.0000 mg | ORAL_TABLET | Freq: Every day | ORAL | 3 refills | Status: DC

## 2022-07-30 MED ORDER — GABAPENTIN 800 MG PO TABS: 800.0000 mg | ORAL_TABLET | Freq: Three times a day (TID) | ORAL | 1 refills | Status: DC

## 2022-07-30 NOTE — Assessment & Plan Note (Signed)
Chronic.  Currently not well-controlled.  Increase gabapentin to 800 mg 3 times daily

## 2022-07-30 NOTE — Patient Instructions (Signed)
It was very nice to see you today!  Increase the Ozempic to 1 mg weekly  then call in 1 month to increase to 2 mg if tolerating well.    PLEASE NOTE:  If you had any lab tests please let us know if you have not heard back within a few days. You may see your results on MyChart before we have a chance to review them but we will give you a call once they are reviewed by [place]. If we ordered any referrals today, please let us know if you have not heard from their office within the next week.   Please try these tips to maintain a healthy lifestyle:  Eat most of your calories during the day when you are active. Eliminate processed foods including packaged sweets (pies, cakes, cookies), reduce intake of potatoes, white bread, white pasta, and white rice. Look for whole grain options, oat flour or almond flour.  Each meal should contain half fruits/vegetables, one quarter protein, and one quarter carbs (no bigger than a computer mouse).  Cut down on sweet beverages. This includes juice, soda, and sweet tea. Also watch fruit intake, though this is a healthier sweet option, it still contains natural sugar! Limit to 3 servings daily.  Drink at least 1 glass of water with each meal and aim for at least 8 glasses per day  Exercise at least 150 minutes every week.

## 2022-07-30 NOTE — Progress Notes (Signed)
Subjective:     Patient ID: Leah Powers, female    DOB: [birth year], 62 y.o.   MRN: [id number]  Chief Complaint  Patient presents with   Medical Management of Chronic Issues   Bronchitis    6 month follow-up    HPI  Hypothyroidism-on 125 mcg synthroid-managed by psychiatry-adjusted medications.  DM type 2- on Farxiga 5 mg daily. Ozempic 0.5 mg-doing well.  Sugars running 140  working on diet/exercise  A1C checked in Nov 6.6 HTN-Pt is on losartan 50mg .  Bp's running 110's/70's  No ha/dizziness/cp/palp/cough/shortness of breath Edema-Lasix daily helps. Bipolar-taking lithium - seeing mental health.  Doing well.  No SI  a lot of stress Asthma-taking albuterol occasional-more perfume. Needs refill  Itching-gabapentin 2 pills tid  Health Maintenance Due  Topic Date Due   COLONOSCOPY (Pts 45-17yrs Insurance coverage will need to be confirmed)  Never done    Past Medical History:  Diagnosis Date   Allergy    Arthritis    L hand, R ankle  (10/08/2016)   Bipolar 1 disorder (HCC)    Depression    Diabetes mellitus without complication (HCC)    Fibromyalgia    told that the breakout on the upper back , could be over active nerves , also treated with cream & Gabapentin- [facility] [place]    History of kidney stones    spontaneous passing   Hypertension    Hypothyroid    Pneumonia ~ 1967; 07/2016   Pyelonephritis 1997   treated with IV antibiotic     Past Surgical History:  Procedure Laterality Date   BLADDER INSTILLATION  X 2   CHALAZION EXCISION Bilateral    R- eye X2 , L eye- X1   FOOT ARTHRODESIS Right 10/08/2016   Procedure: RIGHT TRIPLE ARTHRODESIS;  Surgeon: Nadara Mustard, MD;  Location: [facility] OR;  Service: Orthopedics;  Laterality: Right;   FOOT ARTHRODESIS, TRIPLE Right 10/08/2016   ankle   FRENULECTOMY, LINGUAL  1970s   LAPAROSCOPIC CHOLECYSTECTOMY  1996   PALATE SURGERY  1970s   tissue removed from upper palate as a child    URETHRAL DILATION        Current Outpatient Medications:    cetirizine (ZYRTEC) 5 MG tablet, Take 5 mg by mouth 2 (two) times daily as needed for allergies. , Disp: , Rfl:    dapagliflozin propanediol (FARXIGA) 5 MG TABS tablet, Take 1 tablet (5 mg total) by mouth daily., Disp: 90 tablet, Rfl: 3   docusate sodium (COLACE) 100 MG capsule, Take 100 mg by mouth 3 (three) times daily., Disp: , Rfl:    Elastic Bandages & Supports (MEDICAL COMPRESSION SOCKS) MISC, Wear compression stockings daily with upright activity. Remove at bedtime., Disp: 2 each, Rfl: 0   gabapentin (NEURONTIN) 800 MG tablet, Take 1 tablet (800 mg total) by mouth 3 (three) times daily., Disp: 90 tablet, Rfl: 1   ketoconazole (NIZORAL) 2 % shampoo, Apply 1 Application topically 3 (three) times a week., Disp: , Rfl:    Lancets (ONETOUCH DELICA PLUS LANCET30G) MISC, Inject 1 Stick into the skin daily., Disp: 100 each, Rfl: 2   levothyroxine (SYNTHROID) 125 MCG tablet, Take 1 tablet (125 mcg total) by mouth daily before breakfast., Disp: 30 tablet, Rfl: 1   lithium carbonate 300 MG capsule, Take 3 capsules (900 mg total) by mouth at bedtime., Disp: 270 capsule, Rfl: 1   ONETOUCH VERIO test strip, 1 each by Other route daily., Disp: 100 each, Rfl: 3  rosuvastatin (CRESTOR) 10 MG tablet, Take 1 tablet (10 mg total) by mouth daily., Disp: 90 tablet, Rfl: 3   Semaglutide, 1 MG/DOSE, 4 MG/3ML SOPN, Inject 1 mg into the skin once a week., Disp: 3 mL, Rfl: 0   albuterol (VENTOLIN HFA) 108 (90 Base) MCG/ACT inhaler, Inhale 2 puffs into the lungs every 6 (six) hours as needed for wheezing or shortness of breath., Disp: 1 each, Rfl: 2   furosemide (LASIX) 20 MG tablet, Take 1 tablet (20 mg total) by mouth daily., Disp: 90 tablet, Rfl: 3   hydrocortisone 2.5 % cream, Apply topically 2 (two) times daily., Disp: 30 g, Rfl: 1   losartan (COZAAR) 50 MG tablet, Take 1 tablet (50 mg total) by mouth daily., Disp: 90 tablet, Rfl: 3   mupirocin ointment (BACTROBAN) 2 %,  Apply topical to aa qd, Disp: 22 g, Rfl: 3   valACYclovir (VALTREX) 1000 MG tablet, At the start of an outbreak take 1 tablet daily for 5 days., Disp: 30 tablet, Rfl: 0  Allergies  Allergen Reactions   Eucalyptus Oil Shortness Of Breath    Other reaction(s): flush, cant breath   Haldol [Haloperidol Decanoate] Other (See Comments)    Drooling, weaving in floor, parkinson's syndrome (couldn't eat or talk) , pt was hospitalized    Molds & Smuts Shortness Of Breath and Itching   Other Anaphylaxis, Shortness Of Breath, Itching and Swelling    Bleu cheese    Penicillins Anaphylaxis and Rash    Pt was 62 years old  Has patient had a PCN reaction causing immediate rash, facial/tongue/throat swelling, SOB or lightheadedness with hypotension: Yes Has patient had a PCN reaction causing severe rash involving mucus membranes or skin necrosis: Unknown Has patient had a PCN reaction that required hospitalization: No Has patient had a PCN reaction occurring within the last 10 years: No If all of the above answers are "NO", then may proceed with Cephalosporin use.    Bee Venom Other (See Comments)    Yellow jackets and wasps-stung by 35, MD told pt that she wouldn't have resistance if stung again    Corn Oil     Other reaction(s): diarrhea, lots   Corn-Containing Products Nausea And Vomiting and Other (See Comments)    Stomach distress    Demeclocycline Other (See Comments)   Diflunisal Other (See Comments)    Upset stomach Other reaction(s): Unknown   Haloperidol Lactate     Other reaction(s): stumble around, droul   Hydrochlorothiazide     Other reaction(s): elevated lithium level   Hydrocodone-Acetaminophen Other (See Comments)    Other reaction(s): abd pain   Hydromorphone Other (See Comments)   Imipramine     Other reaction(s): rash   Metformin     Other reaction(s): stomach upset   Metformin And Related    Nitrofurantoin     Other reaction(s): rash   Penicillin G Other (See Comments)     Other reaction(s): rash   Sulfamethoxazole-Trimethoprim     Other reaction(s): rash   Tetracycline Hcl     Other reaction(s): rash   Vicodin [Hydrocodone-Acetaminophen] Diarrhea and Other (See Comments)    Upset stomach   Cephalexin Rash    Other reaction(s): diarrhea   Doxycycline Rash    Other reaction(s): rash   Erythromycin Rash    Other reaction(s): rash   Macrodantin Rash   Motrin [Ibuprofen] Palpitations   Septra [Bactrim] Rash   Tetracyclines & Related Rash   Tofranil-Pm Rash   ROS neg/noncontributory except  as noted HPI/below      Objective:     BP 130/78   Pulse 77   Temp 98.1 F (36.7 C) (Temporal)   Resp 18   Ht 5' 7.5" (1.715 m)   Wt 241 lb 8 oz (109.5 kg)   LMP 07/30/2013   SpO2 95%   BMI 37.27 kg/m  Wt Readings from Last 3 Encounters:  07/30/22 241 lb 8 oz (109.5 kg)  04/30/22 239 lb 4 oz (108.5 kg)  02/10/22 249 lb 4 oz (113.1 kg)    Physical Exam   Gen: WDWN NAD HEENT: NCAT, conjunctiva not injected, sclera nonicteric NECK:  supple, no thyromegaly, no nodes, no carotid bruits CARDIAC: RRR, S1S2+, no murmur. DP 2+B LUNGS: CTAB. No wheezes ABDOMEN:  BS+, soft, NTND, No HSM, no masses EXT:  no edema MSK: no gross abnormalities.  NEURO: A&O x3.  CN II-XII intact.  PSYCH: normal mood. Good eye contact More scars/scabs from itching on shoulders     Assessment & Plan:  Type 2 diabetes mellitus without complication, without long-term current use of insulin (HCC) Assessment & Plan: Chronic.  Controlled.  Continue Farxiga 5 mg daily, Ozempic 0.5 mg daily  Orders: -     Hemoglobin A1c -     Microalbumin / creatinine urine ratio  Primary hypertension Assessment & Plan: Chronic.  Controlled.  Continue losartan 50 mg daily   Other specified hypothyroidism Assessment & Plan: Chronic.  Dose has just been adjusted to Synthroid 125.  Managed by psychiatry   Local edema Assessment & Plan: Chronic.  Controlled.  Continue Lasix 20 mg  daily   Mild intermittent asthma without complication Assessment & Plan: Chronic.  Not using albuterol often, however per family can flare.  Renewed albuterol   Neurodermatitis Assessment & Plan: Chronic.  Currently not well-controlled.  Increase gabapentin to 800 mg 3 times daily  Orders: -     Mupirocin; Apply topical to aa qd  Dispense: 22 g; Refill: 3  Vitamin D deficiency Assessment & Plan: Chronic.  Not on supplements.  Has not been checked in a long time.  Check vitamin D  Orders: -     VITAMIN D 25 Hydroxy (Vit-D Deficiency, Fractures)  Bipolar I disorder (HCC) Assessment & Plan: Chronic.  Controlled.  Continue lithium.  Managed by psychiatry.  She has had some increased stress in her life currently.  Advised to increase activity.  If needing adjustment/other meds, follow-up with psych   Other orders -     Hydrocortisone; Apply topically 2 (two) times daily.  Dispense: 30 g; Refill: 1 -     Albuterol Sulfate HFA; Inhale 2 puffs into the lungs every 6 (six) hours as needed for wheezing or shortness of breath.  Dispense: 1 each; Refill: 2 -     Furosemide; Take 1 tablet (20 mg total) by mouth daily.  Dispense: 90 tablet; Refill: 3 -     Losartan Potassium; Take 1 tablet (50 mg total) by mouth daily.  Dispense: 90 tablet; Refill: 3 -     Semaglutide (1 MG/DOSE); Inject 1 mg into the skin once a week.  Dispense: 3 mL; Refill: 0 -     Gabapentin; Take 1 tablet (800 mg total) by mouth 3 (three) times daily.  Dispense: 90 tablet; Refill: 1  Follow up 18m  Angelena Sole, MD

## 2022-07-30 NOTE — Progress Notes (Signed)
Sugars are doing great! Vitamin DIs very low.Send prescription for 50,000 IUs weekly #12/1 refill

## 2022-07-30 NOTE — Assessment & Plan Note (Signed)
Chronic.  Dose has just been adjusted to Synthroid 125.  Managed by psychiatry

## 2022-07-30 NOTE — Assessment & Plan Note (Signed)
Chronic Controlled Continue Lasix 20 mg daily 

## 2022-07-30 NOTE — Assessment & Plan Note (Signed)
Chronic.  Not on supplements.  Has not been checked in a long time.  Check vitamin D

## 2022-07-30 NOTE — Assessment & Plan Note (Signed)
Chronic.  Not using albuterol often, however per family can flare.  Renewed albuterol

## 2022-07-30 NOTE — Assessment & Plan Note (Signed)
Chronic.  Controlled.  Continue Farxiga 5 mg daily, Ozempic 0.5 mg daily

## 2022-07-30 NOTE — Assessment & Plan Note (Signed)
Chronic.  Controlled.  Continue lithium.  Managed by psychiatry.  She has had some increased stress in her life currently.  Advised to increase activity.  If needing adjustment/other meds, follow-up with psych

## 2022-07-30 NOTE — Assessment & Plan Note (Deleted)
Chronic.  Not using albuterol often, however per family can flare.  Renewed albuterol 

## 2022-07-30 NOTE — Assessment & Plan Note (Signed)
Chronic.  Controlled.  Continue losartan 50 mg daily

## 2022-07-31 ENCOUNTER — Other Ambulatory Visit: Payer: Self-pay | Admitting: *Deleted

## 2022-07-31 DIAGNOSIS — E559 Vitamin D deficiency, unspecified: Secondary | ICD-10-CM

## 2022-07-31 MED ORDER — VITAMIN D (ERGOCALCIFEROL) 1.25 MG (50000 UNIT) PO CAPS
50000.0000 [IU] | ORAL_CAPSULE | ORAL | 1 refills | Status: DC
Start: 2022-07-31 — End: 2023-06-30

## 2022-08-07 ENCOUNTER — Other Ambulatory Visit: Payer: Self-pay | Admitting: Physician Assistant

## 2022-08-12 ENCOUNTER — Ambulatory Visit: Payer: Commercial Managed Care - HMO | Admitting: Family Medicine

## 2022-08-12 ENCOUNTER — Telehealth: Payer: Self-pay | Admitting: Family Medicine

## 2022-08-12 NOTE — Telephone Encounter (Signed)
Spoke with patient and she stated that yesterday around 5 pm she started having lower hip pain and then started vomiting from 5 pm to 10:30 pm, on Sunday she started having some diarrhea. Patient stated she upped her dose of gabapentin and started taking D2, she wanted to know if medications could be causing this. Patient stated before the vomiting started she ate a chicken sub from [employer], crackers and chicken noodle soup. Please advise.

## 2022-08-12 NOTE — Telephone Encounter (Signed)
Pt wanted to know if by taking new meds, can she get vomiting and diarrhea? Please advise.

## 2022-08-13 DIAGNOSIS — E032 Hypothyroidism due to medicaments and other exogenous substances: Secondary | ICD-10-CM | POA: Diagnosis not present

## 2022-08-13 DIAGNOSIS — Z79899 Other long term (current) drug therapy: Secondary | ICD-10-CM | POA: Diagnosis not present

## 2022-08-13 NOTE — Telephone Encounter (Signed)
Left message to return my call.  

## 2022-08-13 NOTE — Telephone Encounter (Signed)
Spoke to patient and she stated she is doing better, took 1 imodium for the diarrhea and no vomiting, she is slowly getting back to trying to eat normally. Patient stated she did increase dose of Ozempic, but it was after she had the episode. She stated that she will see how things go and keep [place] updated.

## 2022-08-14 ENCOUNTER — Other Ambulatory Visit: Payer: Self-pay

## 2022-08-14 LAB — TSH: TSH: 5.17 u[IU]/mL — ABNORMAL HIGH (ref 0.450–4.500)

## 2022-08-14 MED ORDER — LEVOTHYROXINE SODIUM 137 MCG PO TABS: 137.0000 ug | ORAL_TABLET | Freq: Every day | ORAL | 0 refills | Status: DC

## 2022-08-14 NOTE — Progress Notes (Signed)
Please confirm the Levothyroxine dose.  It should be 125 mcg.  If that is correct to have her increase to 137 mcg.  Let me know and I will send the prescription in.  No matter what dose she is on we need to increase it slightly.  Thanks.

## 2022-08-28 ENCOUNTER — Encounter: Payer: Self-pay | Admitting: Family Medicine

## 2022-08-28 ENCOUNTER — Other Ambulatory Visit: Payer: Self-pay | Admitting: Family Medicine

## 2022-08-28 MED ORDER — SEMAGLUTIDE (1 MG/DOSE) 4 MG/3ML ~~LOC~~ SOPN: 1.0000 mg | PEN_INJECTOR | SUBCUTANEOUS | 1 refills | Status: DC

## 2022-08-28 MED ORDER — SEMAGLUTIDE (1 MG/DOSE) 4 MG/3ML ~~LOC~~ SOPN: 1.0000 mg | PEN_INJECTOR | SUBCUTANEOUS | 0 refills | Status: DC

## 2022-09-02 ENCOUNTER — Encounter: Payer: Self-pay | Admitting: Podiatry

## 2022-09-02 ENCOUNTER — Other Ambulatory Visit: Payer: Self-pay | Admitting: Family Medicine

## 2022-09-02 DIAGNOSIS — Z1231 Encounter for screening mammogram for malignant neoplasm of breast: Secondary | ICD-10-CM

## 2022-09-03 ENCOUNTER — Other Ambulatory Visit: Payer: Self-pay | Admitting: Physician Assistant

## 2022-09-04 ENCOUNTER — Telehealth: Payer: Self-pay

## 2022-09-04 ENCOUNTER — Other Ambulatory Visit: Payer: Self-pay

## 2022-09-04 ENCOUNTER — Ambulatory Visit (INDEPENDENT_AMBULATORY_CARE_PROVIDER_SITE_OTHER): Payer: Medicaid Other | Admitting: Physician Assistant

## 2022-09-04 ENCOUNTER — Encounter: Payer: Self-pay | Admitting: Physician Assistant

## 2022-09-04 VITALS — BP 130/80 | HR 71 | Temp 98.6°F | Ht 68.0 in | Wt 239.0 lb

## 2022-09-04 DIAGNOSIS — R112 Nausea with vomiting, unspecified: Secondary | ICD-10-CM | POA: Diagnosis not present

## 2022-09-04 DIAGNOSIS — Z86010 Personal history of colonic polyps: Secondary | ICD-10-CM | POA: Diagnosis not present

## 2022-09-04 DIAGNOSIS — K5904 Chronic idiopathic constipation: Secondary | ICD-10-CM | POA: Diagnosis not present

## 2022-09-04 MED ORDER — PEG 3350-KCL-NA BICARB-NACL 420 G PO SOLR: 4000.0000 mL | Freq: Once | ORAL | 0 refills | Status: AC

## 2022-09-04 MED ORDER — POLYETHYLENE GLYCOL 3350 17 GM/SCOOP PO POWD
1.0000 | Freq: Every day | ORAL | 3 refills | Status: DC
Start: 2022-09-04 — End: 2022-11-24

## 2022-09-04 MED ORDER — NA SULFATE-K SULFATE-MG SULF 17.5-3.13-1.6 GM/177ML PO SOLN: 1.0000 | Freq: Once | ORAL | 0 refills | Status: AC

## 2022-09-04 NOTE — Patient Instructions (Addendum)
Please Start OTC Miralax powder, mix 1 capful in a drink once daily every day for constipation. Drink 64 ounces of water daily.

## 2022-09-04 NOTE — Telephone Encounter (Signed)
They got 2 different bowel preps sent in and they need to know which bowel prep to fill. Please call pharmacy back

## 2022-09-04 NOTE — Progress Notes (Signed)
Celso Amy, PA-C [address]  [address]  [place], [place] [address]  Main: [phone]  Fax: [phone]   Gastroenterology Consultation  Referring Provider:     Jeani Sow, MD Primary Care Physician:  Jeani Sow, MD Primary Gastroenterologist:  Celso Amy, PA-C  Reason for Consultation:     Nausea, vomiting, chronic constipation, repeat colonoscopy        HPI:   Leah Powers is a 62 y.o. y/o female referred for consultation & management  by Jeani Sow, MD.    Patient presents to transfer her GI care to our office.  Previous patient of [facility].  Patient is transferring care to our office due to insurance change.  Last Colonoscopy 03/17/19 at [facility]: 5 mm tubular adenoma polyp in the ascending colon resected with cold biopsy forceps, 4 mm inflammatory polyp polyp in the transverse colon resected with cold biopsy forceps. Extensive amounts of semi-liquid stool seen in the entire colon. Lavage was attempted with incomplete clearance and poor visualization. Poor prep. Repeat colonoscopy recommended in 1 year with extra prep.  Patient has history of chronic constipation.  She is currently taking OTC stool softener daily.  Still having some mild constipation.  She denies abdominal pain.  Has occasional hemorrhoids with mild rectal bleeding on the tissue.  In the past few months she has noticed increased episodes of nausea, vomiting, and abdominal bloating.  Denies heartburn or dysphagia.  Is not taking any GERD medication.  No previous EGD or test for H. pylori.  She has been on Ozempic for 3 months and dose was recently increased.  She thinks this may be contributing to nausea and vomiting which comes and goes.  Also taking [place] for diabetes.  Had previous cholecystectomy many years ago.  Had recent normal labs through her PCP.   Past Medical History:  Diagnosis Date   Allergy    Arthritis    L hand, R ankle  (10/08/2016)   Bipolar 1 disorder (HCC)     Depression    Diabetes mellitus without complication (HCC)    Fibromyalgia    told that the breakout on the upper back , could be over active nerves , also treated with cream & Gabapentin- [facility] [place]    History of kidney stones    spontaneous passing   Hypertension    Hypothyroid    Pneumonia ~ 1967; 07/2016   Pyelonephritis 1997   treated with IV antibiotic     Past Surgical History:  Procedure Laterality Date   BLADDER INSTILLATION  X 2   CHALAZION EXCISION Bilateral    R- eye X2 , L eye- X1   FOOT ARTHRODESIS Right 10/08/2016   Procedure: RIGHT TRIPLE ARTHRODESIS;  Surgeon: Nadara Mustard, MD;  Location: [facility] OR;  Service: Orthopedics;  Laterality: Right;   FOOT ARTHRODESIS, TRIPLE Right 10/08/2016   ankle   FRENULECTOMY, LINGUAL  1970s   LAPAROSCOPIC CHOLECYSTECTOMY  1996   PALATE SURGERY  1970s   tissue removed from upper palate as a child    URETHRAL DILATION      Prior to Admission medications   Medication Sig Start Date End Date Taking? Authorizing Provider  albuterol (VENTOLIN HFA) 108 (90 Base) MCG/ACT inhaler Inhale 2 puffs into the lungs every 6 (six) hours as needed for wheezing or shortness of breath. 07/30/22  Yes Jeani Sow, MD  cetirizine (ZYRTEC) 5 MG tablet Take 5 mg by mouth 2 (two)  times daily as needed for allergies.    Yes [provider]  dapagliflozin propanediol (FARXIGA) 5 MG TABS tablet Take 1 tablet (5 mg total) by mouth daily. 02/10/22  Yes Jeani Sow, MD  docusate sodium (COLACE) 100 MG capsule Take 100 mg by mouth 3 (three) times daily.   Yes [provider]  Elastic Bandages & Supports (MEDICAL COMPRESSION SOCKS) MISC Wear compression stockings daily with upright activity. Remove at bedtime. 04/01/21  Yes Allwardt, Alyssa M, PA-C  furosemide (LASIX) 20 MG tablet Take 1 tablet (20 mg total) by mouth daily. 07/30/22  Yes Jeani Sow, MD  gabapentin (NEURONTIN) 800 MG tablet Take 1 tablet (800 mg  total) by mouth 3 (three) times daily. 07/30/22  Yes Jeani Sow, MD  hydrocortisone 2.5 % cream Apply topically 2 (two) times daily. 07/30/22  Yes Jeani Sow, MD  ketoconazole (NIZORAL) 2 % shampoo Apply 1 Application topically 3 (three) times a week. 10/28/21  Yes [provider]  Lancets (ONETOUCH DELICA PLUS LANCET30G) MISC Inject 1 Stick into the skin daily. 04/01/21  Yes Allwardt, Alyssa M, PA-C  levothyroxine (SYNTHROID) 137 MCG tablet TAKE 1 TABLET BY MOUTH DAILY BEFORE BREAKFAST 09/03/22  Yes Hurst, Teresa T, PA-C  lithium carbonate 300 MG capsule Take 3 capsules (900 mg total) by mouth at bedtime. 07/02/22  Yes Hurst, Rosey Bath T, PA-C  losartan (COZAAR) 50 MG tablet Take 1 tablet (50 mg total) by mouth daily. 07/30/22  Yes Jeani Sow, MD  mupirocin ointment Idelle Jo) 2 % Apply topical to aa qd 07/30/22  Yes Jeani Sow, MD  [facility] VERIO test strip 1 each by Other route daily. 04/01/21  Yes Allwardt, Alyssa M, PA-C  rosuvastatin (CRESTOR) 10 MG tablet Take 1 tablet (10 mg total) by mouth daily. 05/13/22  Yes Jeani Sow, MD  Semaglutide, 1 MG/DOSE, 4 MG/3ML SOPN Inject 1 mg into the skin once a week. 08/28/22  Yes Jeani Sow, MD  Vitamin D, Ergocalciferol, (DRISDOL) 1.25 MG (50000 UNIT) CAPS capsule Take 1 capsule (50,000 Units total) by mouth every 7 (seven) days. 07/31/22  Yes Jeani Sow, MD    Family History  Problem Relation Age of Onset   Sleep apnea Father    Hypertension Father    Diabetes Sister    Hypertension Sister    Asthma Sister    Breast cancer Maternal Grandmother 80   Cancer Paternal Grandfather    Breast cancer Maternal Aunt 47     Social History   Tobacco Use   Smoking status: Never   Smokeless tobacco: Never  Vaping Use   Vaping Use: Never used  Substance Use Topics   Alcohol use: No   Drug use: No    Allergies as of 09/04/2022 - Review Complete 09/04/2022  Allergen Reaction Noted   Eucalyptus oil Shortness Of  Breath 06/12/2011   Haldol [haloperidol decanoate] Other (See Comments) 06/12/2011   Molds & smuts Shortness Of Breath and Itching 06/12/2011   Other Anaphylaxis, Shortness Of Breath, Itching, and Swelling 09/30/2016   Penicillins Anaphylaxis and Rash 06/12/2011   Bee venom Other (See Comments) 09/30/2016   Corn oil  12/31/2020   Corn-containing products Nausea And Vomiting and Other (See Comments) 06/12/2011   Demeclocycline Other (See Comments) 06/12/2011   Diflunisal Other (See Comments) 06/12/2011   Haloperidol lactate  12/31/2020   Hydrochlorothiazide  12/31/2020   Hydrocodone-acetaminophen Other (See Comments) 12/31/2020   Hydromorphone Other (See Comments) 01/17/2022   Imipramine  12/31/2020   Metformin  12/31/2020   Metformin and related  04/20/2019   Nitrofurantoin  12/31/2020   Penicillin g Other (See Comments) 12/31/2020   Sulfamethoxazole-trimethoprim  12/31/2020   Tetracycline hcl  12/31/2020   Vicodin [hydrocodone-acetaminophen] Diarrhea and Other (See Comments) 06/12/2011   Cephalexin Rash 06/12/2011   Doxycycline Rash 06/12/2011   Erythromycin Rash 06/12/2011   Macrodantin Rash 06/12/2011   Motrin [ibuprofen] Palpitations 10/02/2016   Septra [bactrim] Rash 06/12/2011   Tetracyclines & related Rash 06/12/2011   Tofranil-pm Rash 06/12/2011    Review of Systems:    All systems reviewed and negative except where noted in HPI.   Physical Exam:  BP 130/80   Pulse 71   Temp 98.6 F (37 C)   Ht 5\' 8"  (1.727 m)   Wt 239 lb (108.4 kg)   LMP 07/30/2013   BMI 36.34 kg/m  Patient's last menstrual period was 07/30/2013. Psych:  Alert and cooperative. Normal mood and affect. General:   Alert,  Well-developed, well-nourished, pleasant and cooperative in NAD Head:  Normocephalic and atraumatic. Eyes:  Sclera clear, no icterus.   Conjunctiva pink. Neck:  Supple; no masses or thyromegaly. Lungs:  Respirations even and unlabored.  Clear throughout to auscultation.   No  wheezes, crackles, or rhonchi. No acute distress. Heart:  Regular rate and rhythm; no murmurs, clicks, rubs, or gallops. Abdomen:  Normal bowel sounds.  No bruits.  Soft, and obese without masses, hepatosplenomegaly or hernias noted.  No Tenderness.  No guarding or rebound tenderness.    Neurologic:  Alert and oriented x3;  grossly normal neurologically. Psych:  Alert and cooperative. Normal mood and affect.  Imaging Studies: No results found.  Assessment and Plan:   Leah Powers is a 62 y.o. y/o female has been referred for constipation and history of adenomatous colon polyp.  She is also having intermittent nausea and vomiting.  Possible adverse side effect of Ozempic.  GERD, gastritis, H. pylori, and peptic ulcer are also in the differential.  1.  Constipation  Start OTC MiraLAX, mix 1 capful in a drink once daily.  Continue Colace stool softener daily.  Drink 64 ounces of water daily.  2.  History of adenomatous colon polyps  Scheduling Colonoscopy I discussed risks of colonoscopy with patient to include risk of bleeding, colon perforation, and risk of sedation.   Patient expressed understanding and agrees to proceed with colonoscopy.   Extra prep: 2-day liquids; Suprep day 1, Suprep day 2.  3.  Intermittent nausea and vomiting, possibly adverse side effect of taking Ozempic?  Ordering H. pylori breath test  Scheduling EGD I discussed risks of EGD with patient to include risk of bleeding, perforation, and risk of sedation.   Patient expressed understanding and agrees to proceed with EGD.   If EGD is unrevealing, then she will talk with her PCP about holding Ozempic. Follow up in 4 weeks after EGD and colonoscopy with TG.  Celso Amy, PA-C

## 2022-09-04 NOTE — Telephone Encounter (Signed)
Called patient and let her know that 2 preps for bowel prep. Discussed instructions again with patient.

## 2022-09-05 ENCOUNTER — Encounter: Payer: Self-pay | Admitting: Podiatry

## 2022-09-05 ENCOUNTER — Ambulatory Visit (INDEPENDENT_AMBULATORY_CARE_PROVIDER_SITE_OTHER): Payer: Medicaid Other | Admitting: Podiatry

## 2022-09-05 VITALS — BP 143/65

## 2022-09-05 DIAGNOSIS — M79676 Pain in unspecified toe(s): Secondary | ICD-10-CM | POA: Diagnosis not present

## 2022-09-05 DIAGNOSIS — B351 Tinea unguium: Secondary | ICD-10-CM

## 2022-09-05 DIAGNOSIS — E1140 Type 2 diabetes mellitus with diabetic neuropathy, unspecified: Secondary | ICD-10-CM | POA: Diagnosis not present

## 2022-09-05 DIAGNOSIS — E1149 Type 2 diabetes mellitus with other diabetic neurological complication: Secondary | ICD-10-CM

## 2022-09-05 NOTE — Progress Notes (Signed)
This patient returns to my office for at risk foot care.  This patient requires this care by a professional since this patient will be at risk due to having type 2 diabetes. This patient is unable to cut nails herself since the patient cannot reach her nails.These nails are painful walking and wearing shoes.  This patient presents for at risk foot care today.  General Appearance  Alert, conversant and in no acute stress.  Vascular  Dorsalis pedis and posterior tibial  pulses are palpable  bilaterally.  Capillary return is within normal limits  bilaterally. Temperature is within normal limits  bilaterally.  Neurologic  Senn-Weinstein monofilament wire test within normal limits  bilaterally. Muscle power within normal limits bilaterally.  Nails Thick disfigured discolored nails with subungual debris  from hallux to fifth toes bilaterally. No evidence of bacterial infection or drainage bilaterally.  Orthopedic  No limitations of motion  feet .  No crepitus or effusions noted.  No bony pathology or digital deformities noted.  Skin  normotropic skin with no porokeratosis noted bilaterally.  No signs of infections or ulcers noted.     Onychomycosis  Pain in right toes  Pain in left toes  Consent was obtained for treatment procedures.   Mechanical debridement of nails 1-5  bilaterally performed with a nail nipper.  Filed with dremel without incident.    Return office visit    3 months                  Told patient to return for periodic foot care and evaluation due to potential at risk complications.   Jarious Lyon DPM   

## 2022-09-06 LAB — H. PYLORI BREATH TEST: H pylori Breath Test: NEGATIVE

## 2022-09-08 ENCOUNTER — Telehealth: Payer: Self-pay

## 2022-09-08 NOTE — Telephone Encounter (Signed)
Karin Golden called at this time- was told patient is doing the 2 day prep Suprep  the first day and then Golytely the evening before the procedure and then 5 hours before the scheduled time.

## 2022-10-06 ENCOUNTER — Other Ambulatory Visit: Payer: Self-pay | Admitting: Family Medicine

## 2022-10-06 ENCOUNTER — Other Ambulatory Visit: Payer: Self-pay | Admitting: Physician Assistant

## 2022-10-07 ENCOUNTER — Ambulatory Visit: Payer: Medicaid Other | Admitting: Physician Assistant

## 2022-10-20 ENCOUNTER — Ambulatory Visit: Payer: Medicaid Other

## 2022-10-29 ENCOUNTER — Encounter: Payer: Self-pay | Admitting: Dermatology

## 2022-10-29 ENCOUNTER — Ambulatory Visit: Payer: Medicaid Other | Admitting: Dermatology

## 2022-10-29 DIAGNOSIS — L280 Lichen simplex chronicus: Secondary | ICD-10-CM

## 2022-10-29 MED ORDER — MUPIROCIN 2 % EX OINT
TOPICAL_OINTMENT | CUTANEOUS | 3 refills | Status: AC
Start: 2022-10-29 — End: ?

## 2022-10-29 MED ORDER — TACROLIMUS 0.1 % EX OINT: TOPICAL_OINTMENT | CUTANEOUS | 5 refills | Status: DC

## 2022-10-29 MED ORDER — GABAPENTIN 800 MG PO TABS: 800.0000 mg | ORAL_TABLET | Freq: Three times a day (TID) | ORAL | 3 refills | Status: DC

## 2022-10-29 MED ORDER — TRIAMCINOLONE ACETONIDE 0.1 % EX CREA: TOPICAL_CREAM | CUTANEOUS | 3 refills | Status: DC

## 2022-10-29 NOTE — Patient Instructions (Signed)
Hello [name],  Thank you for visiting my office today. 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 Below is a summary of the key instructions from our consultation:  - Triamcinolone Cream:   - Application: Apply twice daily.   - Duration: Continue for up to two weeks.  - Tacrolimus Cream:   - Usage: Use as a backup if itching persists after two weeks of Triamcinolone. Alternate between the two creams every two weeks as needed.  - Gabapentin:   - Dosage: Maintain your current dosage of 800 mg three times a day.   - Note: Take during the day to avoid drowsiness.  - Mupirocin Ointment:   - Indication: Use only if there are open excoriations to prevent infection.  - Avoid Cortisone Cream:   - Note: It is less effective for your condition compared to Triamcinolone.  Please ensure to apply the creams to all affected areas, including any new spots on your buttocks due to sweating.  - Follow-Up: We will review your progress in four months to adjust the treatment plan as necessary.  Should you have any concerns or notice any changes in your symptoms before our next scheduled visit, please do not hesitate to contact the office.  It was a pleasure to meet you today, and I look forward to our continued collaboration in managing your condition. Enjoy the rest of your summer!  Warm regards,  Dr. Langston Reusing, MD Dermatology  Due to recent changes in healthcare laws, you may see results of your pathology and/or laboratory studies on MyChart before the doctors have had a chance to review them. We understand that in some cases there may be results that are confusing or concerning to you. Please understand that not all results are received at the same time and often the doctors may need to interpret multiple results in order to provide you with the best plan of care or course of treatment. Therefore, we ask  that you please give [place] 2 business days to thoroughly review all your results before contacting the office for clarification. Should we see a critical lab result, you will be contacted sooner.   If You Need Anything After Your Visit  If you have any questions or concerns for your doctor, please call our main line at [phone] If no one answers, please leave a voicemail as directed and we will return your call as soon as possible. Messages left after 4 pm will be answered the following business day.   You may also send [place] a message via MyChart. We typically respond to MyChart messages within 1-2 business days.  For prescription refills, please ask your pharmacy to contact our office. Our fax number is [phone].  If you have an urgent issue when the clinic is closed that cannot wait until the next business day, you can page your doctor at the number below.    Please note that while we do our best to be available for urgent issues outside of office hours, we are not available 24/7.   If you have an urgent issue and are unable to reach [place], you may choose to seek medical care at your doctor's office, retail clinic, urgent care center, or emergency room.  If you have a medical emergency, please immediately call 911 or go to the emergency department. In the event of inclement weather, please call our main line at [phone] for an update on the  status of any delays or closures.  Dermatology Medication Tips: Please keep the boxes that topical medications come in in order to help keep track of the instructions about where and how to use these. Pharmacies typically print the medication instructions only on the boxes and not directly on the medication tubes.   If your medication is too expensive, please contact our office at [phone] or send [place] a message through MyChart.   We are unable to tell what your co-pay for medications will be in advance as this is different depending on your  insurance coverage. However, we may be able to find a substitute medication at lower cost or fill out paperwork to get insurance to cover a needed medication.   If a prior authorization is required to get your medication covered by your insurance company, please allow [place] 1-2 business days to complete this process.  Drug prices often vary depending on where the prescription is filled and some pharmacies may offer cheaper prices.  The website www.goodrx.com contains coupons for medications through different pharmacies. The prices here do not account for what the cost may be with help from insurance (it may be cheaper with your insurance), but the website can give you the price if you did not use any insurance.  - You can print the associated coupon and take it with your prescription to the pharmacy.  - You may also stop by our office during regular business hours and pick up a GoodRx coupon card.  - If you need your prescription sent electronically to a different pharmacy, notify our office through [facility] or by phone at [phone]

## 2022-10-29 NOTE — Progress Notes (Signed)
   New Patient Visit   Subjective  Leah Powers is a 62 y.o. female who presents for the following: neurodermatitis and seborrheic dermatitis   Patient states she has neurodermatitis all over that she would like to have examined. Patient reports the areas have been there for years. She reports the areas are bothersome. She states that the areas can pop up anywhere on the body. Patient reports she previously been treated for these areas by Dr. Jorja Loa. She changes for looser fitting clothes and uses topical steroids when she has an outbreak. Triamcinolone is the topical that works best.She was sent to [facility] and they recommended light therapy Patient has no Hx of skin cancer. Patient has  family history of skin cancer(s).    The following portions of the chart were reviewed this encounter and updated as appropriate: medications, allergies, medical history  Review of Systems:  No other skin or systemic complaints except as noted in HPI or Assessment and Plan.  Objective  Well appearing patient in no apparent distress; mood and affect are within normal limits.   A focused examination was performed of the following areas: All over  Relevant exam findings are noted in the Assessment and Plan.  Exam: Pink patches with greasy scale on bilateral eyebrows  Exam: hyperpigmented patch with excoriation from previous flares on back and buttocks              Assessment & Plan   1. Neurodermatitis - Assessment: Current and healed excoriation on upper back.  Well controlled with topical triamcinolone and oral gabapentin - Plan: Continue with triamcinolone cream twice daily for two weeks, then discontinue. If itching persists, +switch to tacrolimus cream for another two weeks. Cycle may be restarted if itching returns after a brief cessation. Discontinue the use of 2% cortisone cream. - Refill gabapentin prescription at 800 mg three times daily. Monitor for side effects, particularly  drowsiness. -Apply mupirocin ointment to open excoriations if signs of infection are present. Continue applying creams to affected areas on the buttocks as discussed.   2. Follow-up - Plan: Schedule a follow-up appointment in four months to reassess neurodermatitis, blood pressure, and overall skin condition. Encourage contacting the clinic for any concerns or worsening symptoms before the scheduled follow-up. Return in about 4 months (around 02/28/2023) for dermatitis.  Owens Shark, CMA, am acting as scribe for [employer], DO.   Documentation: I have reviewed the above documentation for accuracy and completeness, and I agree with the above.  Langston Reusing, DO

## 2022-10-30 ENCOUNTER — Other Ambulatory Visit: Payer: Self-pay | Admitting: Physician Assistant

## 2022-11-04 ENCOUNTER — Encounter: Payer: Self-pay | Admitting: Gastroenterology

## 2022-11-04 ENCOUNTER — Other Ambulatory Visit: Payer: Self-pay

## 2022-11-04 ENCOUNTER — Ambulatory Visit: Payer: Medicaid Other | Admitting: Anesthesiology

## 2022-11-04 ENCOUNTER — Encounter
Admission: RE | Disposition: A | Discharge status: Home / Self Care | Payer: Self-pay | Source: Home / Self Care | Attending: Gastroenterology

## 2022-11-04 ENCOUNTER — Ambulatory Visit: Admission: RE | Admit: 2022-11-04 | Payer: Medicaid Other | Source: Home / Self Care | Admitting: Gastroenterology

## 2022-11-04 DIAGNOSIS — Z09 Encounter for follow-up examination after completed treatment for conditions other than malignant neoplasm: Secondary | ICD-10-CM

## 2022-11-04 DIAGNOSIS — I10 Essential (primary) hypertension: Secondary | ICD-10-CM | POA: Diagnosis not present

## 2022-11-04 DIAGNOSIS — Z1211 Encounter for screening for malignant neoplasm of colon: Secondary | ICD-10-CM | POA: Insufficient documentation

## 2022-11-04 DIAGNOSIS — K644 Residual hemorrhoidal skin tags: Secondary | ICD-10-CM | POA: Diagnosis not present

## 2022-11-04 DIAGNOSIS — K5904 Chronic idiopathic constipation: Secondary | ICD-10-CM

## 2022-11-04 DIAGNOSIS — E039 Hypothyroidism, unspecified: Secondary | ICD-10-CM | POA: Diagnosis not present

## 2022-11-04 DIAGNOSIS — E119 Type 2 diabetes mellitus without complications: Secondary | ICD-10-CM | POA: Diagnosis not present

## 2022-11-04 DIAGNOSIS — K2950 Unspecified chronic gastritis without bleeding: Secondary | ICD-10-CM | POA: Diagnosis not present

## 2022-11-04 DIAGNOSIS — Z538 Procedure and treatment not carried out for other reasons: Secondary | ICD-10-CM | POA: Diagnosis not present

## 2022-11-04 DIAGNOSIS — Z86010 Personal history of colonic polyps: Secondary | ICD-10-CM | POA: Diagnosis not present

## 2022-11-04 DIAGNOSIS — R1013 Epigastric pain: Secondary | ICD-10-CM | POA: Insufficient documentation

## 2022-11-04 DIAGNOSIS — K3189 Other diseases of stomach and duodenum: Secondary | ICD-10-CM | POA: Insufficient documentation

## 2022-11-04 DIAGNOSIS — R112 Nausea with vomiting, unspecified: Secondary | ICD-10-CM

## 2022-11-04 DIAGNOSIS — R110 Nausea: Secondary | ICD-10-CM | POA: Diagnosis not present

## 2022-11-04 DIAGNOSIS — K2970 Gastritis, unspecified, without bleeding: Secondary | ICD-10-CM | POA: Insufficient documentation

## 2022-11-04 DIAGNOSIS — R140 Abdominal distension (gaseous): Secondary | ICD-10-CM | POA: Insufficient documentation

## 2022-11-04 HISTORY — PX: COLONOSCOPY WITH PROPOFOL: SHX5780

## 2022-11-04 HISTORY — PX: BIOPSY: SHX5522

## 2022-11-04 HISTORY — PX: ESOPHAGOGASTRODUODENOSCOPY (EGD) WITH PROPOFOL: SHX5813

## 2022-11-04 LAB — GLUCOSE, CAPILLARY: Glucose-Capillary: 119 mg/dL — ABNORMAL HIGH (ref 70–99)

## 2022-11-04 SURGERY — COLONOSCOPY WITH PROPOFOL
Anesthesia: General

## 2022-11-04 MED ORDER — PEG 3350-KCL-NA BICARB-NACL 420 G PO SOLR: ORAL | 0 refills | Status: DC

## 2022-11-04 MED ORDER — ONDANSETRON HCL 4 MG/2ML IJ SOLN
INTRAMUSCULAR | Status: DC | PRN
  Administered 2022-11-04: 4 mg via INTRAVENOUS

## 2022-11-04 MED ORDER — PROPOFOL 10 MG/ML IV BOLUS
INTRAVENOUS | Status: AC
  Filled 2022-11-04: qty 40

## 2022-11-04 MED ORDER — ONDANSETRON HCL 4 MG/2ML IJ SOLN
INTRAMUSCULAR | Status: AC
  Filled 2022-11-04: qty 2

## 2022-11-04 MED ORDER — LIDOCAINE HCL (PF) 2 % IJ SOLN
INTRAMUSCULAR | Status: AC
  Filled 2022-11-04: qty 5

## 2022-11-04 MED ORDER — SODIUM CHLORIDE 0.9 % IV SOLN: INTRAVENOUS | Status: DC

## 2022-11-04 MED ORDER — PROPOFOL 10 MG/ML IV BOLUS
INTRAVENOUS | Status: DC | PRN
  Administered 2022-11-04: 80 mg via INTRAVENOUS
  Administered 2022-11-04: 50 mg via INTRAVENOUS
  Administered 2022-11-04: 20 mg via INTRAVENOUS
  Administered 2022-11-04: 100 ug/kg/min via INTRAVENOUS

## 2022-11-04 MED ORDER — GLYCOPYRROLATE 0.2 MG/ML IJ SOLN
INTRAMUSCULAR | Status: DC | PRN
  Administered 2022-11-04: .2 mg via INTRAVENOUS

## 2022-11-04 MED ORDER — LIDOCAINE HCL (CARDIAC) PF 100 MG/5ML IV SOSY
PREFILLED_SYRINGE | INTRAVENOUS | Status: DC | PRN
  Administered 2022-11-04: 100 mg via INTRAVENOUS

## 2022-11-04 MED ORDER — GLYCOPYRROLATE 0.2 MG/ML IJ SOLN
INTRAMUSCULAR | Status: AC
  Filled 2022-11-04: qty 1

## 2022-11-04 NOTE — Transfer of Care (Signed)
Immediate Anesthesia Transfer of Care Note  Patient: Leah Powers  Procedure(s) Performed: COLONOSCOPY WITH PROPOFOL ESOPHAGOGASTRODUODENOSCOPY (EGD) WITH PROPOFOL BIOPSY  Patient Location: PACU  Anesthesia Type:MAC  Level of Consciousness: awake  Airway & Oxygen Therapy: Patient Spontanous Breathing  Post-op Assessment: Report given to RN and Post -op Vital signs reviewed and stable  Post vital signs: Reviewed and stable  Last Vitals:  Vitals Value Taken Time  BP 102/62 11/04/22 1105  Temp 36.1 C 11/04/22 1105  Pulse 74 11/04/22 1105  Resp 15 11/04/22 1105  SpO2 92 % 11/04/22 1105    Last Pain:  Vitals:   11/04/22 1105  TempSrc: Temporal  PainSc: 0-No pain         Complications: No notable events documented.

## 2022-11-04 NOTE — Anesthesia Preprocedure Evaluation (Signed)
Anesthesia Evaluation  Patient identified by MRN, date of birth, ID band Patient awake    Reviewed: Allergy & Precautions, NPO status , Patient's Chart, lab work & pertinent test results  Airway Mallampati: III  TM Distance: <3 FB Neck ROM: full    Dental  (+) Chipped   Pulmonary asthma    Pulmonary exam normal        Cardiovascular hypertension, (-) angina negative cardio ROS Normal cardiovascular exam     Neuro/Psych  Neuromuscular disease  negative psych ROS   GI/Hepatic negative GI ROS, Neg liver ROS,neg GERD  ,,  Endo/Other  diabetesHypothyroidism    Renal/GU Renal disease  negative genitourinary   Musculoskeletal   Abdominal   Peds  Hematology negative hematology ROS (+)   Anesthesia Other Findings Past Medical History: No date: Allergy No date: Arthritis     Comment:  L hand, R ankle  (10/08/2016) No date: Bipolar 1 disorder (HCC) No date: Depression No date: Diabetes mellitus without complication (HCC) No date: Fibromyalgia     Comment:  told that the breakout on the upper back , could be over              active nerves , also treated with cream & Gabapentin-               [facility] [place]  No date: History of kidney stones     Comment:  spontaneous passing No date: Hypertension No date: Hypothyroid ~ 1967; 07/2016: Pneumonia 1997: Pyelonephritis     Comment:  treated with IV antibiotic   Past Surgical History: X 2: BLADDER INSTILLATION No date: CHALAZION EXCISION; Bilateral     Comment:  R- eye X2 , L eye- X1 No date: COLONOSCOPY 10/08/2016: FOOT ARTHRODESIS; Right     Comment:  Procedure: RIGHT TRIPLE ARTHRODESIS;  Surgeon: Nadara Mustard, MD;  Location: [facility] OR;  Service: Orthopedics;                Laterality: Right; 10/08/2016: FOOT ARTHRODESIS, TRIPLE; Right     Comment:  ankle 1970s: FRENULECTOMY, LINGUAL 1996: LAPAROSCOPIC CHOLECYSTECTOMY 1970s: PALATE  SURGERY     Comment:  tissue removed from upper palate as a child  No date: URETHRAL DILATION     Reproductive/Obstetrics negative OB ROS                             Anesthesia Physical Anesthesia Plan  ASA: 3  Anesthesia Plan: General   Post-op Pain Management:    Induction: Intravenous  PONV Risk Score and Plan: Propofol infusion and TIVA  Airway Management Planned: Natural Airway and Nasal Cannula  Additional Equipment:   Intra-op Plan:   Post-operative Plan:   Informed Consent: I have reviewed the patients History and Physical, chart, labs and discussed the procedure including the risks, benefits and alternatives for the proposed anesthesia with the patient or authorized representative who has indicated his/her understanding and acceptance.     Dental Advisory Given  Plan Discussed with: Anesthesiologist, CRNA and Surgeon  Anesthesia Plan Comments: (Patient consented for risks of anesthesia including but not limited to:  - adverse reactions to medications - risk of airway placement if required - damage to eyes, teeth, lips or other oral mucosa - nerve damage due to positioning  - sore throat or hoarseness - Damage to heart, brain, nerves, lungs,  other parts of body or loss of life  Patient voiced understanding.)       Anesthesia Quick Evaluation

## 2022-11-04 NOTE — Anesthesia Postprocedure Evaluation (Signed)
Anesthesia Post Note  Patient: Leah Powers  Procedure(s) Performed: COLONOSCOPY WITH PROPOFOL ESOPHAGOGASTRODUODENOSCOPY (EGD) WITH PROPOFOL BIOPSY  Patient location during evaluation: Endoscopy Anesthesia Type: General Level of consciousness: awake and alert Pain management: pain level controlled Vital Signs Assessment: post-procedure vital signs reviewed and stable Respiratory status: spontaneous breathing, nonlabored ventilation, respiratory function stable and patient connected to nasal cannula oxygen Cardiovascular status: blood pressure returned to baseline and stable Postop Assessment: no apparent nausea or vomiting Anesthetic complications: no   No notable events documented.   Last Vitals:  Vitals:   11/04/22 1115 11/04/22 1127  BP: 113/75 128/70  Pulse: 73 70  Resp: 12 15  Temp:    SpO2: 94% 95%    Last Pain:  Vitals:   11/04/22 1127  TempSrc:   PainSc: 0-No pain                 Cleda Mccreedy Jenesis Suchy

## 2022-11-04 NOTE — H&P (Signed)
Arlyss Repress, MD [address]  [address]  [place], [place] [address]  Main: [phone]  Fax: [phone] Pager: [phone]  Primary Care Physician:  Jeani Sow, MD Primary Gastroenterologist:  Dr. Arlyss Repress  Pre-Procedure History & Physical: HPI:  Leah Powers is a 62 y.o. female is here for an endoscopy and colonoscopy.   Past Medical History:  Diagnosis Date   Allergy    Arthritis    L hand, R ankle  (10/08/2016)   Bipolar 1 disorder (HCC)    Depression    Diabetes mellitus without complication (HCC)    Fibromyalgia    told that the breakout on the upper back , could be over active nerves , also treated with cream & Gabapentin- [facility] [place]    History of kidney stones    spontaneous passing   Hypertension    Hypothyroid    Pneumonia ~ 1967; 07/2016   Pyelonephritis 1997   treated with IV antibiotic     Past Surgical History:  Procedure Laterality Date   BLADDER INSTILLATION  X 2   CHALAZION EXCISION Bilateral    R- eye X2 , L eye- X1   COLONOSCOPY     FOOT ARTHRODESIS Right 10/08/2016   Procedure: RIGHT TRIPLE ARTHRODESIS;  Surgeon: Nadara Mustard, MD;  Location: [facility] OR;  Service: Orthopedics;  Laterality: Right;   FOOT ARTHRODESIS, TRIPLE Right 10/08/2016   ankle   FRENULECTOMY, LINGUAL  1970s   LAPAROSCOPIC CHOLECYSTECTOMY  1996   PALATE SURGERY  1970s   tissue removed from upper palate as a child    URETHRAL DILATION      Prior to Admission medications   Medication Sig Start Date End Date Taking? Authorizing Provider  albuterol (VENTOLIN HFA) 108 (90 Base) MCG/ACT inhaler Inhale 2 puffs into the lungs every 6 (six) hours as needed for wheezing or shortness of breath. 07/30/22   Jeani Sow, MD  cetirizine (ZYRTEC) 5 MG tablet Take 5 mg by mouth 2 (two) times daily as needed for allergies.     [provider]  dapagliflozin propanediol (FARXIGA) 5 MG TABS tablet Take 1 tablet (5 mg total) by mouth daily.  02/10/22   Jeani Sow, MD  docusate sodium (COLACE) 100 MG capsule Take 100 mg by mouth 3 (three) times daily.    [provider]  Elastic Bandages & Supports (MEDICAL COMPRESSION SOCKS) MISC Wear compression stockings daily with upright activity. Remove at bedtime. 04/01/21   Allwardt, Crist Infante, PA-C  ferrous sulfate 325 (65 FE) MG EC tablet Take 325 mg by mouth 2 (two) times a week.    [provider]  furosemide (LASIX) 20 MG tablet Take 1 tablet (20 mg total) by mouth daily. 07/30/22   Jeani Sow, MD  gabapentin (NEURONTIN) 800 MG tablet Take 1 tablet (800 mg total) by mouth 3 (three) times daily. 10/29/22   Terri Piedra, DO  hydrocortisone 2.5 % cream Apply topically 2 (two) times daily. 07/30/22   Jeani Sow, MD  ketoconazole (NIZORAL) 2 % shampoo Apply 1 Application topically 3 (three) times a week. 10/28/21   [provider]  Lancets [facility] DELICA PLUS LANCET30G) MISC Inject 1 Stick into the skin daily. 04/01/21   Allwardt, Crist Infante, PA-C  levothyroxine (SYNTHROID) 137 MCG tablet TAKE 1 TABLET BY MOUTH DAILY BEFORE BREAKFAST 10/07/22   Melony Overly T, PA-C  lithium carbonate 300 MG capsule Take 3 capsules (900 mg total) by  mouth at bedtime. 07/02/22   Melony Overly T, PA-C  losartan (COZAAR) 50 MG tablet Take 1 tablet (50 mg total) by mouth daily. 07/30/22   Jeani Sow, MD  mupirocin ointment Idelle Jo) 2 % Apply topical to aa qd 10/29/22   Terri Piedra, DO  [facility] VERIO test strip 1 each by Other route daily. 04/01/21   Allwardt, Crist Infante, PA-C  polyethylene glycol powder (GLYCOLAX/MIRALAX) 17 GM/SCOOP powder Take 255 g by mouth daily. 09/04/22   Celso Amy, PA-C  rosuvastatin (CRESTOR) 10 MG tablet Take 1 tablet (10 mg total) by mouth daily. 05/13/22   Jeani Sow, MD  Semaglutide, 1 MG/DOSE, 4 MG/3ML SOPN Inject 1 mg into the skin once a week. 08/28/22   Jeani Sow, MD  tacrolimus (PROTOPIC) 0.1 % ointment Apply to  affected area twice a day when taking a break from the steroid 10/29/22   Terri Piedra, DO  triamcinolone cream (KENALOG) 0.1 % Apply to affected area twice a day for 2 weeks then stop 10/29/22   Terri Piedra, DO  Vitamin D, Ergocalciferol, (DRISDOL) 1.25 MG (50000 UNIT) CAPS capsule Take 1 capsule (50,000 Units total) by mouth every 7 (seven) days. 07/31/22   Jeani Sow, MD    Allergies as of 09/04/2022 - Review Complete 09/04/2022  Allergen Reaction Noted   Eucalyptus oil Shortness Of Breath 06/12/2011   Haldol [haloperidol decanoate] Other (See Comments) 06/12/2011   Molds & smuts Shortness Of Breath and Itching 06/12/2011   Other Anaphylaxis, Shortness Of Breath, Itching, and Swelling 09/30/2016   Penicillins Anaphylaxis and Rash 06/12/2011   Bee venom Other (See Comments) 09/30/2016   Corn oil  12/31/2020   Corn-containing products Nausea And Vomiting and Other (See Comments) 06/12/2011   Demeclocycline Other (See Comments) 06/12/2011   Diflunisal Other (See Comments) 06/12/2011   Haloperidol lactate  12/31/2020   Hydrochlorothiazide  12/31/2020   Hydrocodone-acetaminophen Other (See Comments) 12/31/2020   Hydromorphone Other (See Comments) 01/17/2022   Imipramine  12/31/2020   Metformin  12/31/2020   Metformin and related  04/20/2019   Nitrofurantoin  12/31/2020   Penicillin g Other (See Comments) 12/31/2020   Sulfamethoxazole-trimethoprim  12/31/2020   Tetracycline hcl  12/31/2020   Vicodin [hydrocodone-acetaminophen] Diarrhea and Other (See Comments) 06/12/2011   Cephalexin Rash 06/12/2011   Doxycycline Rash 06/12/2011   Erythromycin Rash 06/12/2011   Macrodantin Rash 06/12/2011   Motrin [ibuprofen] Palpitations 10/02/2016   Septra [bactrim] Rash 06/12/2011   Tetracyclines & related Rash 06/12/2011   Tofranil-pm Rash 06/12/2011    Family History  Problem Relation Age of Onset   Sleep apnea Father    Hypertension Father    Diabetes Sister     Hypertension Sister    Asthma Sister    Breast cancer Maternal Grandmother 46   Cancer Paternal Grandfather    Breast cancer Maternal Aunt 95    Social History   Socioeconomic History   Marital status: Married    Spouse name: Not on file   Number of children: Not on file   Years of education: Not on file   Highest education level: Not on file  Occupational History   Not on file  Tobacco Use   Smoking status: Never   Smokeless tobacco: Never  Vaping Use   Vaping status: Never Used  Substance and Sexual Activity   Alcohol use: No   Drug use: No   Sexual activity: Not Currently  Other Topics Concern   Not on  file  Social History Narrative   Married, no kids, enjoys her cats.   Social Determinants of Health   Financial Resource Strain: Not on file  Food Insecurity: Not on file  Transportation Needs: Not on file  Physical Activity: Not on file  Stress: Not on file  Social Connections: Not on file  Intimate Partner Violence: Not on file    Review of Systems: See HPI, otherwise negative ROS  Physical Exam: BP (!) 143/67   Pulse (!) 58   Temp (!) 96.9 F (36.1 C) (Temporal)   Resp 16   Ht 5\' 7"  (1.702 m)   Wt 105.6 kg   LMP 07/30/2013   SpO2 96%   BMI 36.46 kg/m  General:   Alert,  pleasant and cooperative in NAD Head:  Normocephalic and atraumatic. Neck:  Supple; no masses or thyromegaly. Lungs:  Clear throughout to auscultation.    Heart:  Regular rate and rhythm. Abdomen:  Soft, nontender and nondistended. Normal bowel sounds, without guarding, and without rebound.   Neurologic:  Alert and  oriented x4;  grossly normal neurologically.  Impression/Plan: Leah Powers is here for an endoscopy and colonoscopy to be performed for History of adenomatous colon polyps, nausea, vomiting, and abdominal bloating .   Risks, benefits, limitations, and alternatives regarding  endoscopy and colonoscopy have been reviewed with the patient.  Questions have been  answered.  All parties agreeable.   Lannette Donath, MD  11/04/2022, 10:39 AM

## 2022-11-04 NOTE — Op Note (Signed)
[facility] Gastroenterology Patient Name: Leah Powers Procedure Date: 11/04/2022 10:29 AM MRN: [id number] Account #: [id number] Date of Birth: [birth year] Admit Type: Outpatient Age: 62 Room: [facility] ENDO ROOM 3 Gender: Female Note Status: Finalized Instrument Name: Upper Endoscope [id number] Procedure:             Upper GI endoscopy Indications:           Dyspepsia, Abdominal bloating Providers:             Toney Reil MD, MD Referring MD:          Jeani Sow (Referring MD) Medicines:             General Anesthesia Complications:         No immediate complications. Estimated blood loss: None. Procedure:             Pre-Anesthesia Assessment:                        - Prior to the procedure, a History and Physical was                         performed, and patient medications and allergies were                         reviewed. The patient is competent. The risks and                         benefits of the procedure and the sedation options and                         risks were discussed with the patient. All questions                         were answered and informed consent was obtained.                         Patient identification and proposed procedure were                         verified by the physician, the nurse, the                         anesthesiologist, the anesthetist and the technician                         in the pre-procedure area in the procedure room in the                         endoscopy suite. Mental Status Examination: alert and                         oriented. Airway Examination: normal oropharyngeal                         airway and neck mobility. Respiratory Examination:                         clear to auscultation. CV Examination: normal.  Prophylactic Antibiotics: The patient does not require                         prophylactic antibiotics. Prior Anticoagulants: The                          patient has taken no anticoagulant or antiplatelet                         agents. ASA Grade Assessment: III - A patient with                         severe systemic disease. After reviewing the risks and                         benefits, the patient was deemed in satisfactory                         condition to undergo the procedure. The anesthesia                         plan was to use general anesthesia. Immediately prior                         to administration of medications, the patient was                         re-assessed for adequacy to receive sedatives. The                         heart rate, respiratory rate, oxygen saturations,                         blood pressure, adequacy of pulmonary ventilation, and                         response to care were monitored throughout the                         procedure. The physical status of the patient was                         re-assessed after the procedure.                        After obtaining informed consent, the endoscope was                         passed under direct vision. Throughout the procedure,                         the patient's blood pressure, pulse, and oxygen                         saturations were monitored continuously. The Endoscope                         was introduced through the mouth, and advanced to the  third part of duodenum. The upper GI endoscopy was                         accomplished without difficulty. The patient tolerated                         the procedure well. Findings:      The duodenal bulb and second portion of the duodenum were normal.       Biopsies were taken with a cold forceps for histology.      A few dispersed diminutive erosions with no bleeding and no stigmata of       recent bleeding were found in the gastric fundus.      The gastric body, incisura and gastric antrum were normal. Biopsies were       taken with a cold forceps for histology.       The cardia and gastric fundus were normal on retroflexion.      Esophagogastric landmarks were identified: the gastroesophageal junction       was found at 39 cm from the incisors.      The gastroesophageal junction and examined esophagus were normal.Thrush       on tongue Impression:            - Normal duodenal bulb and second portion of the                         duodenum. Biopsied.                        - Erosive gastropathy with no bleeding and no stigmata                         of recent bleeding.                        - Normal gastric body, incisura and antrum. Biopsied.                        - Esophagogastric landmarks identified.                        - Normal gastroesophageal junction and esophagus. Recommendation:        - Await pathology results.                        - Proceed with colonoscopy as scheduled                        See colonoscopy report Procedure Code(s):     --- Professional ---                        [phone], Esophagogastroduodenoscopy, flexible,                         transoral; with biopsy, single or multiple Diagnosis Code(s):     --- Professional ---                        K31.89, Other diseases of stomach and duodenum  R10.13, Epigastric pain                        R14.0, Abdominal distension (gaseous) CPT copyright 2022 American Medical Association. All rights reserved. The codes documented in this report are preliminary and upon coder review may  be revised to meet current compliance requirements. Dr. Libby Maw Toney Reil MD, MD 11/04/2022 10:56:13 AM This report has been signed electronically. Number of Addenda: 0 Note Initiated On: 11/04/2022 10:29 AM Estimated Blood Loss:  Estimated blood loss: none.      [facility]

## 2022-11-04 NOTE — Op Note (Signed)
[facility] Gastroenterology Patient Name: Leah Powers Procedure Date: 11/04/2022 10:29 AM MRN: [id number] Account #: [id number] Date of Birth: 04/27/1960 Admit Type: Outpatient Age: 62 Room: [facility] ENDO ROOM 3 Gender: Female Note Status: Finalized Instrument Name: Nelda Marseille [id number] Procedure:             Colonoscopy Indications:           High risk colon cancer surveillance: Personal history                         of colonic polyps Providers:             Toney Reil MD, MD Referring MD:          Jeani Sow (Referring MD) Medicines:             See the Anesthesia note for documentation of the                         administered medications, General Anesthesia Complications:         No immediate complications. Estimated blood loss: None. Procedure:             Pre-Anesthesia Assessment:                        - Prior to the procedure, a History and Physical was                         performed, and patient medications and allergies were                         reviewed. The patient is competent. The risks and                         benefits of the procedure and the sedation options and                         risks were discussed with the patient. All questions                         were answered and informed consent was obtained.                         Patient identification and proposed procedure were                         verified by the physician, the nurse, the                         anesthesiologist, the anesthetist and the technician                         in the pre-procedure area in the endoscopy suite.                         Mental Status Examination: alert and oriented. Airway                         Examination: normal oropharyngeal airway and neck  mobility. Respiratory Examination: clear to                         auscultation. CV Examination: normal. Prophylactic                         Antibiotics: The  patient does not require prophylactic                         antibiotics. Prior Anticoagulants: The patient has                         taken no anticoagulant or antiplatelet agents. ASA                         Grade Assessment: III - A patient with severe systemic                         disease. After reviewing the risks and benefits, the                         patient was deemed in satisfactory condition to                         undergo the procedure. The anesthesia plan was to use                         general anesthesia. Immediately prior to                         administration of medications, the patient was                         re-assessed for adequacy to receive sedatives. The                         heart rate, respiratory rate, oxygen saturations,                         blood pressure, adequacy of pulmonary ventilation, and                         response to care were monitored throughout the                         procedure. The physical status of the patient was                         re-assessed after the procedure.                        After obtaining informed consent, the colonoscope was                         passed under direct vision. Throughout the procedure,                         the patient's blood pressure, pulse, and oxygen  saturations were monitored continuously. The                         Colonoscope was introduced through the anus with the                         intention of advancing to the cecum. The scope was                         advanced to the sigmoid colon before the procedure was                         aborted. Medications were given. The colonoscopy was                         extremely difficult due to poor bowel prep. The                         colonoscopy was aborted due to poor bowel prep. Findings:      The perianal and digital rectal examinations were normal. Pertinent       negatives include normal  sphincter tone and no palpable rectal lesions.      Skin tags were found on perianal exam.      Copious quantities of semi-solid stool was found in the rectum, in the       recto-sigmoid colon and in the sigmoid colon, precluding visualization. Impression:            - The procedure was aborted due to poor bowel prep.                        - Perianal skin tags found on perianal exam.                        - Stool in the rectum, in the recto-sigmoid colon and                         in the sigmoid colon.                        - No specimens collected. Recommendation:        - Discharge patient to home (with escort).                        - Resume previous diet today.                        - Continue present medications.                        - Repeat colonoscopy tomorrow with repeat large volume                         prep or within next 3 months with 2 day prep because                         the bowel preparation was poor. Procedure Code(s):     --- Professional ---  G0105, 53, Colorectal cancer screening; colonoscopy on                         individual at high risk Diagnosis Code(s):     --- Professional ---                        Z86.010, Personal history of colonic polyps                        K64.4, Residual hemorrhoidal skin tags CPT copyright 2022 American Medical Association. All rights reserved. The codes documented in this report are preliminary and upon coder review may  be revised to meet current compliance requirements. Dr. Libby Maw Toney Reil MD, MD 11/04/2022 11:03:21 AM This report has been signed electronically. Number of Addenda: 0 Note Initiated On: 11/04/2022 10:29 AM Total Procedure Duration: 0 hours 1 minute 19 seconds  Estimated Blood Loss:  Estimated blood loss: none.      [facility]

## 2022-11-05 ENCOUNTER — Encounter: Admission: RE | Payer: Self-pay | Source: Home / Self Care

## 2022-11-05 ENCOUNTER — Encounter: Payer: Self-pay | Admitting: Anesthesiology

## 2022-11-05 ENCOUNTER — Encounter: Payer: Self-pay | Admitting: Gastroenterology

## 2022-11-05 ENCOUNTER — Ambulatory Visit: Admission: RE | Admit: 2022-11-05 | Payer: Medicaid Other | Source: Home / Self Care | Admitting: Gastroenterology

## 2022-11-05 SURGERY — COLONOSCOPY WITH PROPOFOL
Anesthesia: General

## 2022-11-06 ENCOUNTER — Telehealth: Payer: Self-pay

## 2022-11-06 ENCOUNTER — Telehealth: Payer: Self-pay | Admitting: Physician Assistant

## 2022-11-06 DIAGNOSIS — R112 Nausea with vomiting, unspecified: Secondary | ICD-10-CM

## 2022-11-06 MED ORDER — LINACLOTIDE 145 MCG PO CAPS: 145.0000 ug | ORAL_CAPSULE | Freq: Every day | ORAL | 3 refills | Status: DC

## 2022-11-06 NOTE — Telephone Encounter (Signed)
Linzess 145 mcg sent to pharmacy  I let patient know we had samples of Linzess 72 mg and to take 2 capsules once daily. Increase water intake. And keep scheduled appointment 11-26-22

## 2022-11-06 NOTE — Telephone Encounter (Signed)
Gastric Empty scheduled [facility] on 11/13/22 arrive at 9:00 am .  Nothing to eat/drink 4 hours prior. Patient notified.

## 2022-11-06 NOTE — Telephone Encounter (Signed)
Patient had her colonoscopy cancel because she is constipation and the Miralax  didn't work. She needs to see Leah Powers she is not able to get her procedure until she gets the constipation straight.

## 2022-11-07 NOTE — Telephone Encounter (Signed)
Patient called in because she has some questions about her stool softer. Please give her call back.

## 2022-11-10 ENCOUNTER — Telehealth: Payer: Self-pay

## 2022-11-10 NOTE — Telephone Encounter (Signed)
-----   Message from [facility] sent at 11/06/2022  2:57 PM EDT ----- Pathology results from upper endoscopy came back normal Recommend gastric emptying study  RV

## 2022-11-10 NOTE — Telephone Encounter (Signed)
[facility] informed patient and schedule patient for Gastric emptying study

## 2022-11-10 NOTE — Telephone Encounter (Signed)
Per colonoscopy report it said repeat colonoscopy within the next 3 months with 2 day prep. Called patient to see if we could get this schedule and patient said she is not going to schedule this till she talks to tina on 11/26/2022 she states she will schedule this at this appointment if they decide to have it done

## 2022-11-13 ENCOUNTER — Other Ambulatory Visit: Payer: Medicaid Other

## 2022-11-13 ENCOUNTER — Ambulatory Visit
Admission: RE | Admit: 2022-11-13 | Discharge: 2022-11-13 | Disposition: A | Discharge status: Home / Self Care | Payer: Medicaid Other | Source: Ambulatory Visit | Attending: Physician Assistant | Admitting: Physician Assistant

## 2022-11-13 DIAGNOSIS — R112 Nausea with vomiting, unspecified: Secondary | ICD-10-CM | POA: Insufficient documentation

## 2022-11-13 MED ORDER — TECHNETIUM TC 99M SULFUR COLLOID
2.0000 | Freq: Once | INTRAVENOUS | Status: AC
  Administered 2022-11-13: 2.16 via ORAL

## 2022-11-18 ENCOUNTER — Encounter: Payer: Self-pay | Admitting: Family Medicine

## 2022-11-19 ENCOUNTER — Encounter: Payer: Self-pay | Admitting: *Deleted

## 2022-11-19 ENCOUNTER — Other Ambulatory Visit (HOSPITAL_COMMUNITY): Payer: Self-pay

## 2022-11-19 ENCOUNTER — Other Ambulatory Visit: Payer: Self-pay | Admitting: Family Medicine

## 2022-11-19 ENCOUNTER — Telehealth: Payer: Self-pay | Admitting: Pharmacy Technician

## 2022-11-19 DIAGNOSIS — E119 Type 2 diabetes mellitus without complications: Secondary | ICD-10-CM

## 2022-11-19 MED ORDER — RYBELSUS 7 MG PO TABS: 7.0000 mg | ORAL_TABLET | Freq: Every day | ORAL | 1 refills | Status: DC

## 2022-11-19 NOTE — Telephone Encounter (Signed)
Patient notified of message below.

## 2022-11-19 NOTE — Telephone Encounter (Signed)
Pharmacy Patient Advocate Encounter  Received notification from [facility] MEDICAID that Prior Authorization for Rybelsus has been APPROVED from 11/19/22 to 11/19/23. Ran test claim, Copay is $4.00. This test claim was processed through [facility]- copay amounts may vary at other pharmacies due to pharmacy/plan contracts, or as the patient moves through the different stages of their insurance plan.   PA #/Case ID/Reference #: [id number]

## 2022-11-19 NOTE — Telephone Encounter (Signed)
Pharmacy Patient Advocate Encounter   Received notification from CoverMyMeds that prior authorization for Rybelsus is required/requested.   Insurance verification completed.   The patient is insured through [facility] MEDICAID .   Per test claim: PA required; PA submitted to [facility] MEDICAID via CoverMyMeds Key/confirmation #/EOC [id number] Status is pending

## 2022-11-21 NOTE — Telephone Encounter (Signed)
error 

## 2022-11-24 ENCOUNTER — Other Ambulatory Visit: Payer: Self-pay

## 2022-11-25 NOTE — Progress Notes (Unsigned)
Celso Amy, PA-C [address]  [address]  [place], [place] [address]  Main: [phone]  Fax: [phone]   Primary Care Physician: Jeani Sow, MD  Primary Gastroenterologist:  Celso Amy, PA-C / Dr. Lannette Donath    CC: Follow-up chronic constipation, nausea, vomiting, history of colon polyps  HPI: Leah Powers is a 62 y.o. female who returns for follow-up of chronic constipation, nausea, vomiting, and history of colon polyps.  She was scheduled for a repeat colonoscopy with Dr. Allegra Lai 11/04/2022.  Her prep was poor (with 2 day liquids and 2 SuPreps), therefore colonoscopy was not able to be performed.  She is here to discuss treatment for constipation and reschedule colonoscopy with extra prep.  EGD 11/04/22 by Dr. Allegra Lai: A few nonbleeding gastric erosions.  Normal esophagus and duodenum.  Biopsy showed chronic erosive gastritis and negative for H. pylori.  She took MiraLAX and stool softener for constipation.  Currently on Linzess 145 mcg daily.  Constipation has improved yet not controlled.  She is having a bowel movement 3-5 times per week.  Has cramping over her right abdomen intermittently for many years.  She is having small bowel movements.  Denies diarrhea, hard stools, or straining.  Denies rectal bleeding.  She continues to have occasional episode of vomiting acid.  Not currently on a PPI.  H. pylori breath test 08/2022 was Negative.  Previous cholecystectomy in 1996.  She had gastric emptying test 11/13/2022 to evaluate nausea and vomiting.  Gastric emptying test was normal.  No evidence of gastroparesis.  Last Colonoscopy 03/17/19 at [facility]: 5 mm tubular adenoma polyp in the ascending colon resected with cold biopsy forceps, 4 mm inflammatory polyp polyp in the transverse colon resected with cold biopsy forceps. Poor Prep.  Current Outpatient Medications  Medication Sig Dispense Refill   albuterol (VENTOLIN HFA) 108 (90 Base) MCG/ACT inhaler Inhale 2  puffs into the lungs every 6 (six) hours as needed for wheezing or shortness of breath. 1 each 2   cetirizine (ZYRTEC) 5 MG tablet Take 5 mg by mouth 2 (two) times daily as needed for allergies.      dapagliflozin propanediol (FARXIGA) 5 MG TABS tablet Take 1 tablet (5 mg total) by mouth daily. 90 tablet 3   dicyclomine (BENTYL) 10 MG capsule Take 1 capsule (10 mg total) by mouth 3 (three) times daily before meals. 90 capsule 2   docusate sodium (COLACE) 100 MG capsule Take 100 mg by mouth 3 (three) times daily.     Elastic Bandages & Supports (MEDICAL COMPRESSION SOCKS) MISC Wear compression stockings daily with upright activity. Remove at bedtime. 2 each 0   furosemide (LASIX) 20 MG tablet Take 1 tablet (20 mg total) by mouth daily. 90 tablet 3   gabapentin (NEURONTIN) 800 MG tablet Take 1 tablet (800 mg total) by mouth 3 (three) times daily. 270 tablet 3   Lancets (ONETOUCH DELICA PLUS LANCET30G) MISC Inject 1 Stick into the skin daily. 100 each 2   levothyroxine (SYNTHROID) 137 MCG tablet TAKE 1 TABLET BY MOUTH DAILY BEFORE BREAKFAST 30 tablet 1   linaclotide (LINZESS) 290 MCG CAPS capsule Take 1 capsule (290 mcg total) by mouth daily before breakfast. 90 capsule 1   lithium carbonate 300 MG capsule Take 3 capsules (900 mg total) by mouth at bedtime. 270 capsule 1   losartan (COZAAR) 50 MG tablet Take 1 tablet (50 mg total) by mouth daily. 90 tablet 3   mupirocin ointment (BACTROBAN) 2 %  Apply topical to aa qd 22 g 3   ONETOUCH VERIO test strip 1 each by Other route daily. 100 each 3   pantoprazole (PROTONIX) 40 MG tablet Take 1 tablet (40 mg total) by mouth daily. 30 tablet 2   rosuvastatin (CRESTOR) 10 MG tablet Take 1 tablet (10 mg total) by mouth daily. 90 tablet 3   Semaglutide (RYBELSUS) 7 MG TABS Take 1 tablet (7 mg total) by mouth daily. 30 tablet 1   tacrolimus (PROTOPIC) 0.1 % ointment Apply to affected area twice a day when taking a break from the steroid 100 g 5   triamcinolone  cream (KENALOG) 0.1 % Apply to affected area twice a day for 2 weeks then stop 453.6 g 3   Vitamin D, Ergocalciferol, (DRISDOL) 1.25 MG (50000 UNIT) CAPS capsule Take 1 capsule (50,000 Units total) by mouth every 7 (seven) days. 12 capsule 1   No current facility-administered medications for this visit.    Allergies as of 11/26/2022 - Review Complete 11/26/2022  Allergen Reaction Noted   Eucalyptus oil Shortness Of Breath 06/12/2011   Haldol [haloperidol decanoate] Other (See Comments) 06/12/2011   Molds & smuts Shortness Of Breath and Itching 06/12/2011   Other Anaphylaxis, Shortness Of Breath, Itching, and Swelling 09/30/2016   Penicillins Anaphylaxis and Rash 06/12/2011   Bee venom Other (See Comments) 09/30/2016   Corn oil  12/31/2020   Corn-containing products Nausea And Vomiting and Other (See Comments) 06/12/2011   Demeclocycline Other (See Comments) 06/12/2011   Diflunisal Other (See Comments) 06/12/2011   Haloperidol lactate  12/31/2020   Hydrochlorothiazide  12/31/2020   Hydrocodone-acetaminophen Other (See Comments) 12/31/2020   Hydromorphone Other (See Comments) 01/17/2022   Imipramine  12/31/2020   Metformin  12/31/2020   Metformin and related  04/20/2019   Nitrofurantoin  12/31/2020   Penicillin g Other (See Comments) 12/31/2020   Sulfamethoxazole-trimethoprim  12/31/2020   Tetracycline hcl  12/31/2020   Vicodin [hydrocodone-acetaminophen] Diarrhea and Other (See Comments) 06/12/2011   Cephalexin Rash 06/12/2011   Doxycycline Rash 06/12/2011   Erythromycin Rash 06/12/2011   Macrodantin Rash 06/12/2011   Motrin [ibuprofen] Palpitations 10/02/2016   Septra [bactrim] Rash 06/12/2011   Tetracyclines & related Rash 06/12/2011   Tofranil-pm Rash 06/12/2011    Past Medical History:  Diagnosis Date   Allergy    Arthritis    L hand, R ankle  (10/08/2016)   Bipolar 1 disorder (HCC)    Depression    Diabetes mellitus without complication (HCC)    Fibromyalgia     told that the breakout on the upper back , could be over active nerves , also treated with cream & Gabapentin- [facility] [place]    History of kidney stones    spontaneous passing   Hypertension    Hypothyroid    Pneumonia ~ 1967; 07/2016   Pyelonephritis 1997   treated with IV antibiotic     Past Surgical History:  Procedure Laterality Date   BIOPSY  11/04/2022   Procedure: BIOPSY;  Surgeon: Toney Reil, MD;  Location: [facility] ENDOSCOPY;  Service: Gastroenterology;;   BLADDER INSTILLATION  X 2   CHALAZION EXCISION Bilateral    R- eye X2 , L eye- X1   COLONOSCOPY     COLONOSCOPY WITH PROPOFOL N/A 11/04/2022   Procedure: COLONOSCOPY WITH PROPOFOL;  Surgeon: Toney Reil, MD;  Location: [facility] ENDOSCOPY;  Service: Gastroenterology;  Laterality: N/A;  Patient does not want to be the first patient.  Prefers mid-morning  ESOPHAGOGASTRODUODENOSCOPY (EGD) WITH PROPOFOL N/A 11/04/2022   Procedure: ESOPHAGOGASTRODUODENOSCOPY (EGD) WITH PROPOFOL;  Surgeon: Toney Reil, MD;  Location: [facility] ENDOSCOPY;  Service: Gastroenterology;  Laterality: N/A;   FOOT ARTHRODESIS Right 10/08/2016   Procedure: RIGHT TRIPLE ARTHRODESIS;  Surgeon: Nadara Mustard, MD;  Location: [facility] OR;  Service: Orthopedics;  Laterality: Right;   FOOT ARTHRODESIS, TRIPLE Right 10/08/2016   ankle   FRENULECTOMY, LINGUAL  1970s   LAPAROSCOPIC CHOLECYSTECTOMY  1996   PALATE SURGERY  1970s   tissue removed from upper palate as a child    URETHRAL DILATION      Review of Systems:    All systems reviewed and negative except where noted in HPI.   Physical Examination:   BP 135/75   Pulse 64   Temp 98.1 F (36.7 C)   Ht 5\' 8"  (1.727 m)   Wt 240 lb (108.9 kg)   LMP 07/30/2013   BMI 36.49 kg/m   General: Well-nourished, well-developed in no acute distress.  Neuro: Alert and oriented x 3.  Grossly intact.  Psych: Alert and cooperative, normal mood and affect. No Exam performed.  Imaging  Studies: NM Gastric Emptying  Result Date: 11/13/2022 CLINICAL DATA:  Nausea and vomiting EXAM: NUCLEAR MEDICINE GASTRIC EMPTYING SCAN TECHNIQUE: After oral ingestion of radiolabeled meal, sequential abdominal images were obtained for 3 hours. Percentage of activity emptying the stomach was calculated at 1 hour, 2 hour, and 3 hours. RADIOPHARMACEUTICALS:  2.16 mCi Tc-59m sulfur colloid in standardized meal COMPARISON:  CT January 02, 2022 FINDINGS: Expected location of the stomach in the left upper quadrant. Ingested meal empties the stomach gradually over the course of the study. 64% emptied at 1 hr ( normal >= 10%) 80% emptied at 2 hr ( normal >= 40%) 97% emptied at 3 hr ( normal >= 70%) IMPRESSION: No scintigraphic evidence of delayed gastric emptying. Electronically Signed   By: Maudry Mayhew M.D.   On: 11/13/2022 16:21    Assessment and Plan:   BREANAH ALEXANDRA is a 62 y.o. y/o female returns for follow-up of chronic constipation and chronic nausea and vomiting.  Recent gastric emptying test was normal.  No evidence of gastroparesis.  EGD showed erosive gastritis and biopsies negative for H. pylori.  Not currently on a PPI.  Colonoscopy was not able to be completed due to poor prep.  She has chronic constipation.  1.  Chronic constipation  Increase Linzess to 290 mcg daily, samples and prescription given.  2.  Erosive gastritis; biopsies negative for H. Pylori  Avoidance NSAIDS  Start pantoprazole 40 Mg 1 tablet once daily, #30, 2 refills..  3.  Chronic nausea and vomiting -suspect due to GERD and gastritis.  Start pantoprazole 40 mg 1 tablet once daily.  4.  History of adenomatous colon polyps  After we get her constipation under control, then we will schedule a repeat colonoscopy with extra prep:  2-day liquids  Sutab day #1, GoLytely day #2  5.  Abdominal cramping.  Suspect irritable bowel syndrome with constipation.  Start dicyclomine 10 Mg 1 tablet 3 times daily as needed for  Abdominal cramping.   Celso Amy, PA-C  Follow up in 1 month.  Plan to schedule repeat colonoscopy at her next office visit after constipation and nausea have improved.

## 2022-11-26 ENCOUNTER — Ambulatory Visit (INDEPENDENT_AMBULATORY_CARE_PROVIDER_SITE_OTHER): Payer: Medicaid Other | Admitting: Physician Assistant

## 2022-11-26 ENCOUNTER — Encounter: Payer: Self-pay | Admitting: Physician Assistant

## 2022-11-26 VITALS — BP 135/75 | HR 64 | Temp 98.1°F | Ht 68.0 in | Wt 240.0 lb

## 2022-11-26 DIAGNOSIS — K2960 Other gastritis without bleeding: Secondary | ICD-10-CM | POA: Diagnosis not present

## 2022-11-26 DIAGNOSIS — K5904 Chronic idiopathic constipation: Secondary | ICD-10-CM

## 2022-11-26 DIAGNOSIS — K581 Irritable bowel syndrome with constipation: Secondary | ICD-10-CM

## 2022-11-26 DIAGNOSIS — K5909 Other constipation: Secondary | ICD-10-CM | POA: Diagnosis not present

## 2022-11-26 DIAGNOSIS — R112 Nausea with vomiting, unspecified: Secondary | ICD-10-CM | POA: Diagnosis not present

## 2022-11-26 DIAGNOSIS — R109 Unspecified abdominal pain: Secondary | ICD-10-CM

## 2022-11-26 DIAGNOSIS — Z86010 Personal history of colonic polyps: Secondary | ICD-10-CM | POA: Diagnosis not present

## 2022-11-26 MED ORDER — PANTOPRAZOLE SODIUM 40 MG PO TBEC
40.0000 mg | DELAYED_RELEASE_TABLET | Freq: Every day | ORAL | 2 refills | Status: DC
Start: 2022-11-26 — End: 2023-02-23

## 2022-11-26 MED ORDER — DICYCLOMINE HCL 10 MG PO CAPS
10.0000 mg | ORAL_CAPSULE | Freq: Three times a day (TID) | ORAL | 2 refills | Status: DC
Start: 2022-11-26 — End: 2023-02-23

## 2022-11-26 MED ORDER — LINACLOTIDE 290 MCG PO CAPS
290.0000 ug | ORAL_CAPSULE | Freq: Every day | ORAL | 1 refills | Status: DC
Start: 2022-11-26 — End: 2023-05-20

## 2022-12-02 ENCOUNTER — Ambulatory Visit: Payer: Medicaid Other | Admitting: Physician Assistant

## 2022-12-04 ENCOUNTER — Other Ambulatory Visit: Payer: Self-pay | Admitting: Physician Assistant

## 2022-12-09 ENCOUNTER — Ambulatory Visit: Payer: Medicaid Other | Admitting: Physician Assistant

## 2022-12-10 ENCOUNTER — Encounter: Payer: Self-pay | Admitting: Physician Assistant

## 2022-12-10 ENCOUNTER — Ambulatory Visit (INDEPENDENT_AMBULATORY_CARE_PROVIDER_SITE_OTHER): Payer: Medicaid Other | Admitting: Physician Assistant

## 2022-12-10 DIAGNOSIS — F319 Bipolar disorder, unspecified: Secondary | ICD-10-CM | POA: Diagnosis not present

## 2022-12-10 DIAGNOSIS — E032 Hypothyroidism due to medicaments and other exogenous substances: Secondary | ICD-10-CM | POA: Diagnosis not present

## 2022-12-10 DIAGNOSIS — Z79899 Other long term (current) drug therapy: Secondary | ICD-10-CM

## 2022-12-10 NOTE — Progress Notes (Signed)
[facility]  Patient ID: Leah Powers,  MRN: [id number]  PCP: Jeani Sow, MD  Date of Evaluation: 12/10/2022 Time spent:35 minutes  Chief Complaint:  Chief Complaint   Follow-up    HISTORY/CURRENT STATUS: HPI for routine med check.  Doing well.  Feels like meds are still working as intended. Patient is able to enjoy things.  She and her husband went out of town for several days and had a good time.  Energy and motivation are good.  No extreme sadness, tearfulness, or feelings of hopelessness.  Sleeps well most of the time.  ADLs and personal hygiene are normal.   Denies any changes in concentration, making decisions, or remembering things.  Appetite has not changed.  Weight is stable.  No complaints of anxiety.  No panic attacks.  Denies suicidal or homicidal thoughts.  Patient denies increased energy with decreased need for sleep, increased talkativeness, racing thoughts, impulsivity or risky behaviors, increased spending, increased libido, grandiosity, increased irritability or anger, paranoia, or hallucinations.  Denies dizziness, syncope, seizures, numbness, tingling, tremor, tics, unsteady gait, slurred speech, confusion. Denies muscle or joint pain, stiffness, or dystonia. Denies unexplained weight loss, frequent infections, or sores that heal slowly.  No polyphagia, polydipsia, or polyuria. Denies visual changes or paresthesias.   Individual Medical History/ Review of Systems: Changes? :Yes    GI issues, followed by [facility]  Past medications for mental health diagnoses include: Prolixin, Haldol, Ativan, lithium, Xanax, Synthroid, gabapentin  Allergies: Eucalyptus oil, Haldol [haloperidol decanoate], Molds & smuts, Other, Penicillins, Bee venom, Corn oil, Corn-containing products, Demeclocycline, Diflunisal, Haloperidol lactate, Hydrochlorothiazide, Hydrocodone-acetaminophen, Hydromorphone, Imipramine, Metformin, Metformin and related,  Nitrofurantoin, Penicillin g, Sulfamethoxazole-trimethoprim, Tetracycline hcl, Vicodin [hydrocodone-acetaminophen], Cephalexin, Doxycycline, Erythromycin, Macrodantin, Motrin [ibuprofen], Septra [bactrim], Tetracyclines & related, and Tofranil-pm  Current Medications:  Current Outpatient Medications:    albuterol (VENTOLIN HFA) 108 (90 Base) MCG/ACT inhaler, Inhale 2 puffs into the lungs every 6 (six) hours as needed for wheezing or shortness of breath., Disp: 1 each, Rfl: 2   cetirizine (ZYRTEC) 5 MG tablet, Take 5 mg by mouth 2 (two) times daily as needed for allergies. , Disp: , Rfl:    dapagliflozin propanediol (FARXIGA) 5 MG TABS tablet, Take 1 tablet (5 mg total) by mouth daily., Disp: 90 tablet, Rfl: 3   dicyclomine (BENTYL) 10 MG capsule, Take 1 capsule (10 mg total) by mouth 3 (three) times daily before meals., Disp: 90 capsule, Rfl: 2   docusate sodium (COLACE) 100 MG capsule, Take 100 mg by mouth 3 (three) times daily., Disp: , Rfl:    Elastic Bandages & Supports (MEDICAL COMPRESSION SOCKS) MISC, Wear compression stockings daily with upright activity. Remove at bedtime., Disp: 2 each, Rfl: 0   furosemide (LASIX) 20 MG tablet, Take 1 tablet (20 mg total) by mouth daily., Disp: 90 tablet, Rfl: 3   gabapentin (NEURONTIN) 800 MG tablet, Take 1 tablet (800 mg total) by mouth 3 (three) times daily., Disp: 270 tablet, Rfl: 3   Lancets (ONETOUCH DELICA PLUS LANCET30G) MISC, Inject 1 Stick into the skin daily., Disp: 100 each, Rfl: 2   levothyroxine (SYNTHROID) 137 MCG tablet, TAKE 1 TABLET BY MOUTH DAILY BEFORE BREAKFAST, Disp: 30 tablet, Rfl: 0   linaclotide (LINZESS) 290 MCG CAPS capsule, Take 1 capsule (290 mcg total) by mouth daily before breakfast., Disp: 90 capsule, Rfl: 1   lithium carbonate 300 MG capsule, Take 3 capsules (900 mg total) by mouth at bedtime., Disp: 270 capsule, Rfl: 1   losartan (  COZAAR) 50 MG tablet, Take 1 tablet (50 mg total) by mouth daily., Disp: 90 tablet, Rfl: 3    mupirocin ointment (BACTROBAN) 2 %, Apply topical to aa qd, Disp: 22 g, Rfl: 3   ONETOUCH VERIO test strip, 1 each by Other route daily., Disp: 100 each, Rfl: 3   pantoprazole (PROTONIX) 40 MG tablet, Take 1 tablet (40 mg total) by mouth daily., Disp: 30 tablet, Rfl: 2   rosuvastatin (CRESTOR) 10 MG tablet, Take 1 tablet (10 mg total) by mouth daily., Disp: 90 tablet, Rfl: 3   Semaglutide (RYBELSUS) 7 MG TABS, Take 1 tablet (7 mg total) by mouth daily., Disp: 30 tablet, Rfl: 1   triamcinolone cream (KENALOG) 0.1 %, Apply to affected area twice a day for 2 weeks then stop, Disp: 453.6 g, Rfl: 3   Vitamin D, Ergocalciferol, (DRISDOL) 1.25 MG (50000 UNIT) CAPS capsule, Take 1 capsule (50,000 Units total) by mouth every 7 (seven) days., Disp: 12 capsule, Rfl: 1 Medication Side Effects: none  Family Medical/ Social History: Changes? no  MENTAL HEALTH EXAM:  Last menstrual period 07/30/2013.There is no height or weight on file to calculate BMI.  General Appearance: Casual, Well Groomed, and Obese  Eye Contact:  Good  Speech:  Clear and Coherent, Normal Rate, Talkative, and giggly which is normal for her  Volume:  Normal  Mood:  Euthymic  Affect:  Congruent  Thought Process:  Goal Directed and Descriptions of Associations: Circumstantial  Orientation:  Full (Time, Place, and Person)  Thought Content: Logical   Suicidal Thoughts:  No  Homicidal Thoughts:  No  Memory:  WNL  Judgement:  Good  Insight:  Good  Psychomotor Activity:  Normal  Concentration:  Concentration: Good and Attention Span: Good  Recall:  Good  Fund of Knowledge: Good  Language: Good  Assets:  Desire for Improvement Financial Resources/Insurance Housing Resilience Transportation  ADL's:  Intact  Cognition: WNL  Prognosis:  Good   Labs 07/30/2022 Hgb A1C 6.2 TSH 5.170 Vitamin D 11.48  07/08/2022 TSH 0.39 CMP Glu 130, BUN/Cr nl  05/08/2022 Lithium 0.6 TSH 0.17 Lipids TC 115, Trig 124, HDL 46.5, LDL 44 LFTs  wnl IBC and ferritin wnl  DIAGNOSES:    ICD-10-CM   1. Bipolar I disorder (HCC)  F31.9 TSH    Basic metabolic panel    Lithium level    CANCELED: Lithium level    CANCELED: Basic metabolic panel    2. Hypothyroidism due to medication  E03.2 TSH    CANCELED: TSH    3. Encounter for long-term (current) use of medications  Z79.899 TSH    Basic metabolic panel    Lithium level    CANCELED: Lithium level    CANCELED: TSH    CANCELED: Basic metabolic panel     Receiving Psychotherapy: No   RECOMMENDATIONS:  PDMP was reviewed.  Gabapentin filled 12/04/2022. I provided 35 minutes of face to face time during this encounter, including time spent before and after the visit in records review, medical decision making, counseling pertinent to today's visit, and charting.   She is doing well from a mental health standpoint so no changes will be made.  Continue gabapentin 800 mg, 1 p.o. 3 times daily. continue levothyroxine 137 mcg q am. Continue lithium 300 mg, 3 p.o. nightly. Labs ordered as above. Return in 6 months.   Melony Overly, PA-C

## 2022-12-12 DIAGNOSIS — E032 Hypothyroidism due to medicaments and other exogenous substances: Secondary | ICD-10-CM | POA: Diagnosis not present

## 2022-12-12 DIAGNOSIS — F319 Bipolar disorder, unspecified: Secondary | ICD-10-CM | POA: Diagnosis not present

## 2022-12-12 DIAGNOSIS — Z79899 Other long term (current) drug therapy: Secondary | ICD-10-CM | POA: Diagnosis not present

## 2022-12-14 LAB — LITHIUM LEVEL: Lithium Lvl: 0.6 mmol/L (ref 0.5–1.2)

## 2022-12-14 LAB — BASIC METABOLIC PANEL
BUN/Creatinine Ratio: 20 (ref 12–28)
BUN: 15 mg/dL (ref 8–27)
CO2: 24 mmol/L (ref 20–29)
Calcium: 9.9 mg/dL (ref 8.7–10.3)
Chloride: 107 mmol/L — ABNORMAL HIGH (ref 96–106)
Creatinine, Ser: 0.74 mg/dL (ref 0.57–1.00)
Glucose: 97 mg/dL (ref 70–99)
Potassium: 4.3 mmol/L (ref 3.5–5.2)
Sodium: 142 mmol/L (ref 134–144)
eGFR: 92 mL/min/{1.73_m2} (ref >59)

## 2022-12-14 LAB — TSH: TSH: 1.01 u[IU]/mL (ref 0.450–4.500)

## 2022-12-14 NOTE — Progress Notes (Signed)
Please let her know TSH is nl, cont same dose of Levothyroxine. BUN and Cr are nl. Lithium level is 0.6 which is good for her. No change in tx.

## 2022-12-15 NOTE — Progress Notes (Signed)
LVM TO RC

## 2022-12-16 DIAGNOSIS — H0102B Squamous blepharitis left eye, upper and lower eyelids: Secondary | ICD-10-CM | POA: Diagnosis not present

## 2022-12-16 DIAGNOSIS — H2513 Age-related nuclear cataract, bilateral: Secondary | ICD-10-CM | POA: Diagnosis not present

## 2022-12-16 DIAGNOSIS — H524 Presbyopia: Secondary | ICD-10-CM | POA: Diagnosis not present

## 2022-12-16 DIAGNOSIS — H532 Diplopia: Secondary | ICD-10-CM | POA: Diagnosis not present

## 2022-12-16 DIAGNOSIS — H5213 Myopia, bilateral: Secondary | ICD-10-CM | POA: Diagnosis not present

## 2022-12-16 DIAGNOSIS — H04123 Dry eye syndrome of bilateral lacrimal glands: Secondary | ICD-10-CM | POA: Diagnosis not present

## 2022-12-16 DIAGNOSIS — H0102A Squamous blepharitis right eye, upper and lower eyelids: Secondary | ICD-10-CM | POA: Diagnosis not present

## 2022-12-16 DIAGNOSIS — H1045 Other chronic allergic conjunctivitis: Secondary | ICD-10-CM | POA: Diagnosis not present

## 2022-12-16 DIAGNOSIS — E119 Type 2 diabetes mellitus without complications: Secondary | ICD-10-CM | POA: Diagnosis not present

## 2022-12-16 LAB — HM DIABETES EYE EXAM

## 2022-12-19 ENCOUNTER — Other Ambulatory Visit: Payer: Self-pay | Admitting: Physician Assistant

## 2022-12-22 ENCOUNTER — Other Ambulatory Visit: Payer: Self-pay | Admitting: Physician Assistant

## 2022-12-29 ENCOUNTER — Other Ambulatory Visit: Payer: Self-pay

## 2022-12-29 DIAGNOSIS — K589 Irritable bowel syndrome without diarrhea: Secondary | ICD-10-CM | POA: Insufficient documentation

## 2022-12-29 NOTE — Progress Notes (Signed)
Subjective:     Patient ID: Leah Powers, female    DOB: 05/08/1960, 62 y.o.   MRN: [id number]  No chief complaint on file.   HPI  HTN - Pt is on ***.  Bp's running ***.  No ha/dizziness/cp/palp/edema/cough/sob.  DM, type 2 - ***  *** - ***  *** - ***  *** - ***  *** - ***   Health Maintenance Due  Topic Date Due   FOOT EXAM  08/02/2022   COVID-19 Vaccine (9 - 2023-24 season) 11/16/2022    Past Medical History:  Diagnosis Date   Allergy    Arthritis    L hand, R ankle  (10/08/2016)   Bipolar 1 disorder (HCC)    Depression    Diabetes mellitus without complication (HCC)    Fibromyalgia    told that the breakout on the upper back , could be over active nerves , also treated with cream & Gabapentin- [facility] [place]    History of kidney stones    spontaneous passing   Hypertension    Hypothyroid    Pneumonia ~ 1967; 07/2016   Pyelonephritis 1997   treated with IV antibiotic     Past Surgical History:  Procedure Laterality Date   BIOPSY  11/04/2022   Procedure: BIOPSY;  Surgeon: Toney Reil, MD;  Location: [facility] END[facility]COPY;  Service: Gastroenterology;;   BLADDER INSTILLATION  X 2   CHALAZION EXCISION Bilateral    R- eye X2 , L eye- X1   COLONOSCOPY     COLONOSCOPY WITH PROPOFOL N/A 11/04/2022   Procedure: COLONOSCOPY WITH PROPOFOL;  Surgeon: Toney Reil, MD;  Location: [facility]COPY;  Service: Gastroenterology;  Laterality: N/A;  Patient does not want to be the first patient.  Prefers mid-morning   ESOPHAGOGASTRODUODENOSCOPY (EGD) WITH PROPOFOL N/A 11/04/2022   Procedure: ESOPHAGOGASTRODUODENOSCOPY (EGD) WITH PROPOFOL;  Surgeon: Toney Reil, MD;  Location: [facility];  Service: Gastroenterology;  Laterality: N/A;   FOOT ARTHRODESIS Right 10/08/2016   Procedure: RIGHT TRIPLE ARTHRODESIS;  Surgeon: Nadara Mustard, MD;  Location: [facility] OR;  Service: Orthopedics;  Laterality: Right;   FOOT ARTHRODESIS, TRIPLE Right 10/08/2016    ankle   FRENULECTOMY, LINGUAL  1970s   LAPAROSCOPIC CHOLECYSTECTOMY  1996   PALATE SURGERY  1970s   tissue removed from upper palate as a child    URETHRAL DILATION       Current Outpatient Medications:    albuterol (VENTOLIN HFA) 108 (90 Base) MCG/ACT inhaler, Inhale 2 puffs into the lungs every 6 (six) hours as needed for wheezing or shortness of breath., Disp: 1 each, Rfl: 2   cetirizine (ZYRTEC) 5 MG tablet, Take 5 mg by mouth 2 (two) times daily as needed for allergies. , Disp: , Rfl:    dapagliflozin propanediol (FARXIGA) 5 MG TABS tablet, Take 1 tablet (5 mg total) by mouth daily., Disp: 90 tablet, Rfl: 3   dicyclomine (BENTYL) 10 MG capsule, Take 1 capsule (10 mg total) by mouth 3 (three) times daily before meals., Disp: 90 capsule, Rfl: 2   docusate sodium (COLACE) 100 MG capsule, Take 100 mg by mouth 3 (three) times daily., Disp: , Rfl:    Elastic Bandages & Supports (MEDICAL COMPRESSION SOCKS) MISC, Wear compression stockings daily with upright activity. Remove at bedtime., Disp: 2 each, Rfl: 0   furosemide (LASIX) 20 MG tablet, Take 1 tablet (20 mg total) by mouth daily., Disp: 90 tablet, Rfl: 3   gabapentin (NEURONTIN) 800 MG tablet,  Take 1 tablet (800 mg total) by mouth 3 (three) times daily., Disp: 270 tablet, Rfl: 3   Lancets (ONETOUCH DELICA PLUS LANCET30G) MISC, Inject 1 Stick into the skin daily., Disp: 100 each, Rfl: 2   levothyroxine (SYNTHROID) 137 MCG tablet, TAKE 1 TABLET BY MOUTH DAILY BEFORE BREAKFAST, Disp: 30 tablet, Rfl: 0   linaclotide (LINZESS) 290 MCG CAPS capsule, Take 1 capsule (290 mcg total) by mouth daily before breakfast., Disp: 90 capsule, Rfl: 1   lithium carbonate 300 MG capsule, TAKE THREE CAPSULES BY MOUTH EVERY NIGHT AT BEDTIME, Disp: 90 capsule, Rfl: 5   losartan (COZAAR) 50 MG tablet, Take 1 tablet (50 mg total) by mouth daily., Disp: 90 tablet, Rfl: 3   mupirocin ointment (BACTROBAN) 2 %, Apply topical to aa qd, Disp: 22 g, Rfl: 3   ONETOUCH  VERIO test strip, 1 each by Other route daily., Disp: 100 each, Rfl: 3   pantoprazole (PROTONIX) 40 MG tablet, Take 1 tablet (40 mg total) by mouth daily., Disp: 30 tablet, Rfl: 2   rosuvastatin (CRESTOR) 10 MG tablet, Take 1 tablet (10 mg total) by mouth daily., Disp: 90 tablet, Rfl: 3   Semaglutide (RYBELSUS) 7 MG TABS, Take 1 tablet (7 mg total) by mouth daily., Disp: 30 tablet, Rfl: 1   Simethicone (GAS-X MAXIMUM STRENGTH PO), , Disp: , Rfl:    triamcinolone cream (KENALOG) 0.1 %, Apply to affected area twice a day for 2 weeks then stop, Disp: 453.6 g, Rfl: 3   Vitamin D, Ergocalciferol, (DRISDOL) 1.25 MG (50000 UNIT) CAPS capsule, Take 1 capsule (50,000 Units total) by mouth every 7 (seven) days., Disp: 12 capsule, Rfl: 1  Allergies  Allergen Reactions   Eucalyptus Oil Shortness Of Breath    Other reaction(s): flush, cant breath   Haldol [Haloperidol Decanoate] Other (See Comments)    Drooling, weaving in floor, parkinson's syndrome (couldn't eat or talk) , pt was hospitalized    Molds & Smuts Shortness Of Breath and Itching   Other Anaphylaxis, Shortness Of Breath, Itching and Swelling    Bleu cheese    Penicillins Anaphylaxis and Rash    Pt was 62 years old  Has patient had a PCN reaction causing immediate rash, facial/tongue/throat swelling, SOB or lightheadedness with hypotension: Yes Has patient had a PCN reaction causing severe rash involving mucus membranes or skin necrosis: Unknown Has patient had a PCN reaction that required hospitalization: No Has patient had a PCN reaction occurring within the last 10 years: No If all of the above answers are "NO", then may proceed with Cephalosporin use.    Bee Venom Other (See Comments)    Yellow jackets and wasps-stung by 35, MD told pt that she wouldn't have resistance if stung again    Corn Oil     Other reaction(s): diarrhea, lots   Corn-Containing Products Nausea And Vomiting and Other (See Comments)    Stomach distress     Demeclocycline Other (See Comments)   Diflunisal Other (See Comments)    Upset stomach Other reaction(s): Unknown   Haloperidol Lactate     Other reaction(s): stumble around, droul   Hydrochlorothiazide     Other reaction(s): elevated lithium level   Hydrocodone-Acetaminophen Other (See Comments)    Other reaction(s): abd pain   Hydromorphone Other (See Comments)   Imipramine     Other reaction(s): rash   Metformin     Other reaction(s): stomach upset   Metformin And Related    Nitrofurantoin  Other reaction(s): rash   Penicillin G Other (See Comments)    Other reaction(s): rash   Sulfamethoxazole-Trimethoprim     Other reaction(s): rash   Tetracycline Hcl     Other reaction(s): rash   Vicodin [Hydrocodone-Acetaminophen] Diarrhea and Other (See Comments)    Upset stomach   Cephalexin Rash    Other reaction(s): diarrhea   Doxycycline Rash    Other reaction(s): rash   Erythromycin Rash    Other reaction(s): rash   Macrodantin Rash   Motrin [Ibuprofen] Palpitations   Septra [Bactrim] Rash   Tetracyclines & Related Rash   Tofranil-Pm Rash   ROS neg/noncontributory except as noted HPI/below      Objective:     LMP 07/30/2013  Wt Readings from Last 3 Encounters:  11/26/22 240 lb (108.9 kg)  11/04/22 232 lb 12.8 oz (105.6 kg)  09/04/22 239 lb (108.4 kg)    Physical Exam   Gen: WDWN NAD HEENT: NCAT, conjunctiva not injected, sclera nonicteric NECK:  supple, no thyromegaly, no nodes, no carotid bruits CARDIAC: RRR, S1S2+, no murmur. DP 2+B LUNGS: CTAB. No wheezes ABDOMEN:  BS+, soft, NTND, No HSM, no masses EXT:  no edema MSK: no gross abnormalities.  NEURO: A&O x3.  CN II-XII intact.  PSYCH: normal mood. Good eye contact     Assessment & Plan:  There are no diagnoses linked to this encounter.  No follow-ups on file.   I, Isabelle Course, acting as a scribe for Angelena Sole, MD., have documented all relevant documentation on the behalf of Angelena Sole,  MD, as directed by  Angelena Sole, MD while in the presence of Angelena Sole, MD.  I, Isabelle Course, have reviewed all documentation for this visit. The documentation on 12/29/22 for the exam, diagnosis, procedures, and orders are all accurate and complete.  ***   Isabelle Course

## 2022-12-30 ENCOUNTER — Encounter: Payer: Self-pay | Admitting: Family Medicine

## 2022-12-30 ENCOUNTER — Ambulatory Visit (INDEPENDENT_AMBULATORY_CARE_PROVIDER_SITE_OTHER): Payer: Medicaid Other | Admitting: Family Medicine

## 2022-12-30 VITALS — BP 124/79 | HR 68 | Temp 98.1°F | Resp 18 | Ht 67.5 in | Wt 240.2 lb

## 2022-12-30 DIAGNOSIS — E119 Type 2 diabetes mellitus without complications: Secondary | ICD-10-CM | POA: Diagnosis not present

## 2022-12-30 DIAGNOSIS — E559 Vitamin D deficiency, unspecified: Secondary | ICD-10-CM

## 2022-12-30 DIAGNOSIS — D508 Other iron deficiency anemias: Secondary | ICD-10-CM | POA: Diagnosis not present

## 2022-12-30 DIAGNOSIS — Z7984 Long term (current) use of oral hypoglycemic drugs: Secondary | ICD-10-CM | POA: Diagnosis not present

## 2022-12-30 DIAGNOSIS — D649 Anemia, unspecified: Secondary | ICD-10-CM | POA: Insufficient documentation

## 2022-12-30 DIAGNOSIS — I10 Essential (primary) hypertension: Secondary | ICD-10-CM | POA: Diagnosis not present

## 2022-12-30 MED ORDER — ONETOUCH VERIO VI STRP
1.0000 | ORAL_STRIP | Freq: Every day | 3 refills | Status: AC
Start: 2022-12-30 — End: ?

## 2022-12-30 MED ORDER — DAPAGLIFLOZIN PROPANEDIOL 5 MG PO TABS
5.0000 mg | ORAL_TABLET | Freq: Every day | ORAL | 3 refills | Status: DC
Start: 2022-12-30 — End: 2023-12-30

## 2022-12-30 NOTE — Assessment & Plan Note (Signed)
Chronic.  Currently on vitamin D 50,000 units weekly.  Check levels

## 2022-12-30 NOTE — Assessment & Plan Note (Signed)
Chronic.  Controlled.  Continue losartan 50 mg daily

## 2022-12-30 NOTE — Assessment & Plan Note (Signed)
Much improved.  Possibly due to recent bleed diagnosed gastric ulcers.  Currently on iron twice weekly.  Will see if stores are back up and if she can stop taking it now

## 2022-12-30 NOTE — Assessment & Plan Note (Signed)
Chronic.  Controlled.  Continue Farxiga 5 mg daily, Rybelsus 7 mg daily.  She does have some calluses on her feet.  She is trying to get in with podiatry.  Advised to keep trying as I am very concerned about the calluses and her diabetes.

## 2022-12-30 NOTE — Progress Notes (Unsigned)
Celso Amy, PA-C [address]  [address]  [place], [place] [address]  Main: [phone]  Fax: [phone]   Primary Care Physician: Jeani Sow, MD  Primary Gastroenterologist:  Celso Amy, PA-C / Dr. Lannette Donath    CC: 1 month follow-up chronic constipation, chronic nausea and vomiting  HPI: Leah Powers is a 62 y.o. female returns for follow-up of chronic constipation, nausea, and vomiting.  1 month ago Linzess was increased to 290 mcg daily.  Started on dicyclomine 10 Mg 3 times daily as needed abdominal cramping.  She is taking pantoprazole 40 mg daily for chronic nausea and erosive gastritis.  She is due to schedule colonoscopy with extra prep (2-day liquids, Sutab day 1, GoLytely day 2).  Has history of colon polyps.  Colonoscopy was attempted by Dr. Allegra Lai 11/04/2022.  Prep was poor (with 2-day liquids and 2 Suprep's).  She needs to reschedule colonoscopy with extra prep.  EGD 11/04/22 by Dr. Allegra Lai: A few nonbleeding gastric erosions.  Normal esophagus and duodenum.  Biopsy showed chronic erosive gastritis and negative for H. pylori.   H. pylori breath test 08/2022 was Negative.  Previous cholecystectomy in 1996. Normal Gastric emptying test 11/13/2022.   Last Colonoscopy 02/2019 at [facility]: 5 mm tubular adenoma polyp in the ascending colon ,4 mm inflammatory polyp polyp in the transverse colon. Poor Prep.  Current Outpatient Medications  Medication Sig Dispense Refill   albuterol (VENTOLIN HFA) 108 (90 Base) MCG/ACT inhaler Inhale 2 puffs into the lungs every 6 (six) hours as needed for wheezing or shortness of breath. 1 each 2   cetirizine (ZYRTEC) 5 MG tablet Take 5 mg by mouth 2 (two) times daily as needed for allergies.      coal tar (NEUTROGENA T/GEL STUBBORN ITCH) 0.5 % shampoo Apply topically at bedtime as needed.     dapagliflozin propanediol (FARXIGA) 5 MG TABS tablet Take 1 tablet (5 mg total) by mouth daily. 90 tablet 3   dicyclomine (BENTYL) 10 MG  capsule Take 1 capsule (10 mg total) by mouth 3 (three) times daily before meals. 90 capsule 2   docusate sodium (COLACE) 100 MG capsule Take 100 mg by mouth 3 (three) times daily.     Elastic Bandages & Supports (MEDICAL COMPRESSION SOCKS) MISC Wear compression stockings daily with upright activity. Remove at bedtime. 2 each 0   ferrous sulfate 325 (65 FE) MG tablet Take 325 mg by mouth 2 (two) times a week.     furosemide (LASIX) 20 MG tablet Take 1 tablet (20 mg total) by mouth daily. 90 tablet 3   gabapentin (NEURONTIN) 800 MG tablet Take 1 tablet (800 mg total) by mouth 3 (three) times daily. 270 tablet 3   Lancets (ONETOUCH DELICA PLUS LANCET30G) MISC Inject 1 Stick into the skin daily. 100 each 2   levothyroxine (SYNTHROID) 137 MCG tablet TAKE 1 TABLET BY MOUTH DAILY BEFORE BREAKFAST 30 tablet 0   linaclotide (LINZESS) 290 MCG CAPS capsule Take 1 capsule (290 mcg total) by mouth daily before breakfast. 90 capsule 1   lithium carbonate 300 MG capsule TAKE THREE CAPSULES BY MOUTH EVERY NIGHT AT BEDTIME 90 capsule 5   losartan (COZAAR) 50 MG tablet Take 1 tablet (50 mg total) by mouth daily. 90 tablet 3   mupirocin ointment (BACTROBAN) 2 % Apply topical to aa qd 22 g 3   ONETOUCH VERIO test strip 1 each by Other route daily. 100 each 3   pantoprazole (PROTONIX) 40  MG tablet Take 1 tablet (40 mg total) by mouth daily. 30 tablet 2   rosuvastatin (CRESTOR) 10 MG tablet Take 1 tablet (10 mg total) by mouth daily. 90 tablet 3   Semaglutide (RYBELSUS) 7 MG TABS Take 1 tablet (7 mg total) by mouth daily. 30 tablet 1   Simethicone (GAS-X MAXIMUM STRENGTH PO)      triamcinolone cream (KENALOG) 0.1 % Apply to affected area twice a day for 2 weeks then stop 453.6 g 3   valACYclovir (VALTREX) 1000 MG tablet Take 1,000 mg by mouth as needed.     Vitamin D, Ergocalciferol, (DRISDOL) 1.25 MG (50000 UNIT) CAPS capsule Take 1 capsule (50,000 Units total) by mouth every 7 (seven) days. 12 capsule 1   No  current facility-administered medications for this visit.    Allergies as of 12/31/2022 - Review Complete 12/30/2022  Allergen Reaction Noted   Eucalyptus oil Shortness Of Breath 06/12/2011   Haldol [haloperidol decanoate] Other (See Comments) 06/12/2011   Molds & smuts Shortness Of Breath and Itching 06/12/2011   Other Anaphylaxis, Shortness Of Breath, Itching, and Swelling 09/30/2016   Penicillins Anaphylaxis and Rash 06/12/2011   Bee venom Other (See Comments) 09/30/2016   Corn oil  12/31/2020   Corn-containing products Nausea And Vomiting and Other (See Comments) 06/12/2011   Demeclocycline Other (See Comments) 06/12/2011   Diflunisal Other (See Comments) 06/12/2011   Haloperidol lactate  12/31/2020   Hydrochlorothiazide  12/31/2020   Hydrocodone-acetaminophen Other (See Comments) 12/31/2020   Hydromorphone Other (See Comments) 01/17/2022   Imipramine  12/31/2020   Metformin  12/31/2020   Metformin and related  04/20/2019   Nitrofurantoin  12/31/2020   Penicillin g Other (See Comments) 12/31/2020   Sulfamethoxazole-trimethoprim  12/31/2020   Tetracycline hcl  12/31/2020   Vicodin [hydrocodone-acetaminophen] Diarrhea and Other (See Comments) 06/12/2011   Cephalexin Rash 06/12/2011   Doxycycline Rash 06/12/2011   Erythromycin Rash 06/12/2011   Macrodantin Rash 06/12/2011   Motrin [ibuprofen] Palpitations 10/02/2016   Septra [bactrim] Rash 06/12/2011   Tetracyclines & related Rash 06/12/2011   Tofranil-pm Rash 06/12/2011    Past Medical History:  Diagnosis Date   Allergy    Arthritis    L hand, R ankle  (10/08/2016)   Bipolar 1 disorder (HCC)    Depression    Diabetes mellitus without complication (HCC)    Fibromyalgia    told that the breakout on the upper back , could be over active nerves , also treated with cream & Gabapentin- [facility] [place]    History of kidney stones    spontaneous passing   Hypertension    Hypothyroid    Pneumonia ~ 1967;  07/2016   Pyelonephritis 1997   treated with IV antibiotic     Past Surgical History:  Procedure Laterality Date   BIOPSY  11/04/2022   Procedure: BIOPSY;  Surgeon: Toney Reil, MD;  Location: [facility] END[facility]COPY;  Service: Gastroenterology;;   BLADDER INSTILLATION  X 2   CHALAZION EXCISION Bilateral    R- eye X2 , L eye- X1   COLONOSCOPY     COLONOSCOPY WITH PROPOFOL N/A 11/04/2022   Procedure: COLONOSCOPY WITH PROPOFOL;  Surgeon: Toney Reil, MD;  Location: [facility];  Service: Gastroenterology;  Laterality: N/A;  Patient does not want to be the first patient.  Prefers mid-morning   ESOPHAGOGASTRODUODENOSCOPY (EGD) WITH PROPOFOL N/A 11/04/2022   Procedure: ESOPHAGOGASTRODUODENOSCOPY (EGD) WITH PROPOFOL;  Surgeon: Toney Reil, MD;  Location: [facility] ENDOSCOPY;  Service:  Gastroenterology;  Laterality: N/A;   FOOT ARTHRODESIS Right 10/08/2016   Procedure: RIGHT TRIPLE ARTHRODESIS;  Surgeon: Nadara Mustard, MD;  Location: [facility] OR;  Service: Orthopedics;  Laterality: Right;   FOOT ARTHRODESIS, TRIPLE Right 10/08/2016   ankle   FRENULECTOMY, LINGUAL  1970s   LAPAROSCOPIC CHOLECYSTECTOMY  1996   PALATE SURGERY  1970s   tissue removed from upper palate as a child    URETHRAL DILATION      Review of Systems:    All systems reviewed and negative except where noted in HPI.   Physical Examination:   LMP 07/30/2013   General: Well-nourished, well-developed in no acute distress.  Lungs: Clear to auscultation bilaterally. Non-labored. Heart: Regular rate and rhythm, no murmurs rubs or gallops.  Abdomen: Bowel sounds are normal; Abdomen is Soft; No hepatosplenomegaly, masses or hernias;  No Abdominal Tenderness; No guarding or rebound tenderness. Neuro: Alert and oriented x 3.  Grossly intact.  Psych: Alert and cooperative, normal mood and affect.   Imaging Studies: No results found.  Assessment and Plan:   CHAVONNE SFORZA is a 62 y.o. y/o female returns for 1 month  follow-up of:  1.  Chronic constipation  Continue Linzess 290 mcg daily  Add MiraLAX if needed  2.  History of adenomatous colon polyps  Scheduling Colonoscopy I discussed risks of colonoscopy with patient to include risk of bleeding, colon perforation, and risk of sedation.  Patient expressed understanding and agrees to proceed with colonoscopy.  Prep: 2-day liquids, Sutab day 1, GoLytely day 2.  3.  Chronic nausea / vomiting, erosive gastritis; biopsies negative for H. Pylori.  Continue pantoprazole 40 Mg daily  Avoid NSAIDs    Celso Amy, PA-C  Follow up ***  BP check ***

## 2022-12-30 NOTE — Patient Instructions (Signed)

## 2022-12-31 ENCOUNTER — Telehealth: Payer: Self-pay | Admitting: Physician Assistant

## 2022-12-31 ENCOUNTER — Ambulatory Visit: Payer: Medicaid Other | Admitting: Physician Assistant

## 2022-12-31 ENCOUNTER — Encounter: Payer: Self-pay | Admitting: Physician Assistant

## 2022-12-31 VITALS — BP 117/74 | HR 66 | Temp 98.1°F | Ht 68.0 in | Wt 241.2 lb

## 2022-12-31 DIAGNOSIS — Z860100 Personal history of colon polyps, unspecified: Secondary | ICD-10-CM

## 2022-12-31 DIAGNOSIS — K581 Irritable bowel syndrome with constipation: Secondary | ICD-10-CM

## 2022-12-31 DIAGNOSIS — R112 Nausea with vomiting, unspecified: Secondary | ICD-10-CM | POA: Diagnosis not present

## 2022-12-31 DIAGNOSIS — K5904 Chronic idiopathic constipation: Secondary | ICD-10-CM | POA: Diagnosis not present

## 2022-12-31 DIAGNOSIS — Z860101 Personal history of adenomatous and serrated colon polyps: Secondary | ICD-10-CM | POA: Diagnosis not present

## 2022-12-31 DIAGNOSIS — K2960 Other gastritis without bleeding: Secondary | ICD-10-CM

## 2022-12-31 LAB — COMPREHENSIVE METABOLIC PANEL
ALT: 24 U/L (ref 0–35)
AST: 21 U/L (ref 0–37)
Albumin: 4.1 g/dL (ref 3.5–5.2)
Alkaline Phosphatase: 96 U/L (ref 39–117)
BUN: 17 mg/dL (ref 6–23)
CO2: 29 meq/L (ref 19–32)
Calcium: 9.7 mg/dL (ref 8.4–10.5)
Chloride: 107 meq/L (ref 96–112)
Creatinine, Ser: 0.81 mg/dL (ref 0.40–1.20)
GFR: 78.05 mL/min (ref >60.00)
Glucose, Bld: 96 mg/dL (ref 70–99)
Potassium: 3.9 meq/L (ref 3.5–5.1)
Sodium: 142 meq/L (ref 135–145)
Total Bilirubin: 0.3 mg/dL (ref 0.2–1.2)
Total Protein: 6.5 g/dL (ref 6.0–8.3)

## 2022-12-31 LAB — CBC WITH DIFFERENTIAL/PLATELET
Basophils Absolute: 0.1 10*3/uL (ref 0.0–0.1)
Basophils Relative: 1.2 % (ref 0.0–3.0)
Eosinophils Absolute: 0.3 10*3/uL (ref 0.0–0.7)
Eosinophils Relative: 4.9 % (ref 0.0–5.0)
HCT: 46.3 % — ABNORMAL HIGH (ref 36.0–46.0)
Hemoglobin: 14.7 g/dL (ref 12.0–15.0)
Lymphocytes Relative: 24.8 % (ref 12.0–46.0)
Lymphs Abs: 1.7 10*3/uL (ref 0.7–4.0)
MCHC: 31.7 g/dL (ref 30.0–36.0)
MCV: 93.8 fL (ref 78.0–100.0)
Monocytes Absolute: 0.4 10*3/uL (ref 0.1–1.0)
Monocytes Relative: 5.2 % (ref 3.0–12.0)
Neutro Abs: 4.5 10*3/uL (ref 1.4–7.7)
Neutrophils Relative %: 63.9 % (ref 43.0–77.0)
Platelets: 284 10*3/uL (ref 150.0–400.0)
RBC: 4.93 Mil/uL (ref 3.87–5.11)
RDW: 14 % (ref 11.5–15.5)
WBC: 7 10*3/uL (ref 4.0–10.5)

## 2022-12-31 LAB — IBC + FERRITIN
Ferritin: 11.3 ng/mL (ref 10.0–291.0)
Iron: 226 ug/dL — ABNORMAL HIGH (ref 42–145)
Saturation Ratios: 56.1 % — ABNORMAL HIGH (ref 20.0–50.0)
TIBC: 403.2 ug/dL (ref 250.0–450.0)
Transferrin: 288 mg/dL (ref 212.0–360.0)

## 2022-12-31 LAB — LIPID PANEL
Cholesterol: 122 mg/dL (ref 0–200)
HDL: 47.3 mg/dL (ref >39.00)
LDL Cholesterol: 56 mg/dL (ref 0–99)
NonHDL: 74.99
Total CHOL/HDL Ratio: 3
Triglycerides: 93 mg/dL (ref 0.0–149.0)
VLDL: 18.6 mg/dL (ref 0.0–40.0)

## 2022-12-31 LAB — VITAMIN D 25 HYDROXY (VIT D DEFICIENCY, FRACTURES): VITD: 30.75 ng/mL (ref 30.00–100.00)

## 2022-12-31 LAB — HEMOGLOBIN A1C: Hgb A1c MFr Bld: 5.9 % (ref 4.6–6.5)

## 2022-12-31 MED ORDER — PEG 3350-KCL-NA BICARB-NACL 420 G PO SOLR: 4000.0000 mL | Freq: Once | ORAL | 0 refills | Status: AC

## 2022-12-31 MED ORDER — SUTAB 1479-225-188 MG PO TABS: 24.0000 | ORAL_TABLET | ORAL | 0 refills | Status: AC

## 2022-12-31 MED ORDER — PROMETHAZINE HCL 25 MG PO TABS
25.0000 mg | ORAL_TABLET | Freq: Four times a day (QID) | ORAL | 0 refills | Status: DC | PRN
Start: 2022-12-31 — End: 2023-02-26

## 2022-12-31 MED ORDER — SUTAB 1479-225-188 MG PO TABS: ORAL_TABLET | ORAL | 0 refills | Status: DC

## 2022-12-31 NOTE — Telephone Encounter (Signed)
Leah Powers has questions about the instruction for the [facility]).

## 2023-01-01 ENCOUNTER — Encounter: Payer: Self-pay | Admitting: Family Medicine

## 2023-01-01 ENCOUNTER — Ambulatory Visit: Payer: Medicaid Other

## 2023-01-01 ENCOUNTER — Ambulatory Visit (INDEPENDENT_AMBULATORY_CARE_PROVIDER_SITE_OTHER): Payer: Medicaid Other | Admitting: Podiatry

## 2023-01-01 ENCOUNTER — Encounter: Payer: Self-pay | Admitting: Podiatry

## 2023-01-01 DIAGNOSIS — M2042 Other hammer toe(s) (acquired), left foot: Secondary | ICD-10-CM

## 2023-01-01 DIAGNOSIS — M778 Other enthesopathies, not elsewhere classified: Secondary | ICD-10-CM

## 2023-01-01 DIAGNOSIS — M7751 Other enthesopathy of right foot: Secondary | ICD-10-CM | POA: Diagnosis not present

## 2023-01-01 DIAGNOSIS — M2041 Other hammer toe(s) (acquired), right foot: Secondary | ICD-10-CM

## 2023-01-01 NOTE — Progress Notes (Signed)
Subjective:   Patient ID: Leah Powers, female   DOB: 62 y.o.   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  Strict instructions of anything should change negatively patient is to let us know immediately

## 2023-01-01 NOTE — Progress Notes (Signed)
Labs great/at goal.  Same meds except When done with prescription vitamin D, take otc D 2000 international /day Can stop iron for now-stores still a little low but I think will stay ok

## 2023-01-15 ENCOUNTER — Encounter: Payer: Self-pay | Admitting: Physician Assistant

## 2023-01-15 DIAGNOSIS — H524 Presbyopia: Secondary | ICD-10-CM | POA: Diagnosis not present

## 2023-01-16 ENCOUNTER — Ambulatory Visit
Admission: RE | Admit: 2023-01-16 | Discharge: 2023-01-16 | Disposition: A | Discharge status: Home / Self Care | Payer: Medicaid Other | Source: Ambulatory Visit | Attending: Family Medicine | Admitting: Family Medicine

## 2023-01-16 DIAGNOSIS — Z1231 Encounter for screening mammogram for malignant neoplasm of breast: Secondary | ICD-10-CM | POA: Diagnosis not present

## 2023-01-23 ENCOUNTER — Other Ambulatory Visit: Payer: Self-pay | Admitting: Physician Assistant

## 2023-01-29 ENCOUNTER — Encounter: Payer: Self-pay | Admitting: Gastroenterology

## 2023-02-05 ENCOUNTER — Telehealth: Payer: Self-pay | Admitting: Physician Assistant

## 2023-02-05 ENCOUNTER — Encounter: Payer: Self-pay | Admitting: Gastroenterology

## 2023-02-05 ENCOUNTER — Ambulatory Visit: Payer: Medicaid Other | Admitting: Certified Registered Nurse Anesthetist

## 2023-02-05 ENCOUNTER — Other Ambulatory Visit: Payer: Self-pay

## 2023-02-05 ENCOUNTER — Ambulatory Visit
Admission: RE | Admit: 2023-02-05 | Discharge: 2023-02-05 | Disposition: A | Discharge status: Home / Self Care | Payer: Medicaid Other | Attending: Gastroenterology | Admitting: Gastroenterology

## 2023-02-05 ENCOUNTER — Encounter
Admission: RE | Disposition: A | Discharge status: Home / Self Care | Payer: Self-pay | Source: Home / Self Care | Attending: Gastroenterology

## 2023-02-05 ENCOUNTER — Telehealth: Payer: Self-pay

## 2023-02-05 DIAGNOSIS — Z91199 Patient's noncompliance with other medical treatment and regimen due to unspecified reason: Secondary | ICD-10-CM | POA: Diagnosis not present

## 2023-02-05 DIAGNOSIS — E039 Hypothyroidism, unspecified: Secondary | ICD-10-CM | POA: Insufficient documentation

## 2023-02-05 DIAGNOSIS — Z538 Procedure and treatment not carried out for other reasons: Secondary | ICD-10-CM | POA: Insufficient documentation

## 2023-02-05 DIAGNOSIS — F319 Bipolar disorder, unspecified: Secondary | ICD-10-CM | POA: Insufficient documentation

## 2023-02-05 DIAGNOSIS — Z9049 Acquired absence of other specified parts of digestive tract: Secondary | ICD-10-CM | POA: Insufficient documentation

## 2023-02-05 DIAGNOSIS — Z860100 Personal history of colon polyps, unspecified: Secondary | ICD-10-CM

## 2023-02-05 DIAGNOSIS — I10 Essential (primary) hypertension: Secondary | ICD-10-CM | POA: Insufficient documentation

## 2023-02-05 DIAGNOSIS — Z1211 Encounter for screening for malignant neoplasm of colon: Secondary | ICD-10-CM | POA: Diagnosis not present

## 2023-02-05 DIAGNOSIS — E119 Type 2 diabetes mellitus without complications: Secondary | ICD-10-CM | POA: Insufficient documentation

## 2023-02-05 DIAGNOSIS — M797 Fibromyalgia: Secondary | ICD-10-CM | POA: Insufficient documentation

## 2023-02-05 DIAGNOSIS — Z7984 Long term (current) use of oral hypoglycemic drugs: Secondary | ICD-10-CM | POA: Diagnosis not present

## 2023-02-05 DIAGNOSIS — J45909 Unspecified asthma, uncomplicated: Secondary | ICD-10-CM | POA: Diagnosis not present

## 2023-02-05 HISTORY — PX: COLONOSCOPY WITH PROPOFOL: SHX5780

## 2023-02-05 LAB — GLUCOSE, CAPILLARY: Glucose-Capillary: 82 mg/dL (ref 70–99)

## 2023-02-05 SURGERY — COLONOSCOPY WITH PROPOFOL
Anesthesia: General

## 2023-02-05 MED ORDER — LIDOCAINE HCL (CARDIAC) PF 100 MG/5ML IV SOSY
PREFILLED_SYRINGE | INTRAVENOUS | Status: DC | PRN
  Administered 2023-02-05: 50 mg via INTRAVENOUS

## 2023-02-05 MED ORDER — SODIUM CHLORIDE 0.9 % IV SOLN: INTRAVENOUS | Status: DC

## 2023-02-05 MED ORDER — PROPOFOL 10 MG/ML IV BOLUS
INTRAVENOUS | Status: DC | PRN
  Administered 2023-02-05 (×2): 100 mg via INTRAVENOUS

## 2023-02-05 MED ORDER — GOLYTELY 236 G PO SOLR
4.0000 L | Freq: Once | ORAL | 0 refills | Status: AC
Start: 2023-02-05 — End: 2023-02-05

## 2023-02-05 NOTE — Anesthesia Postprocedure Evaluation (Signed)
Anesthesia Post Note  Patient: Leah Powers  Procedure(s) Performed: COLONOSCOPY WITH PROPOFOL  Patient location during evaluation: Endoscopy Anesthesia Type: General Level of consciousness: awake and alert Pain management: pain level controlled Vital Signs Assessment: post-procedure vital signs reviewed and stable Respiratory status: spontaneous breathing, nonlabored ventilation, respiratory function stable and patient connected to nasal cannula oxygen Cardiovascular status: blood pressure returned to baseline and stable Postop Assessment: no apparent nausea or vomiting Anesthetic complications: no   No notable events documented.   Last Vitals:  Vitals:   02/05/23 0959 02/05/23 1011  BP: (!) 98/59 130/68  Pulse: 71   Resp:  18  Temp:    SpO2: 95% 97%    Last Pain:  Vitals:   02/05/23 1011  TempSrc:   PainSc: 0-No pain                 Louie Boston

## 2023-02-05 NOTE — H&P (Signed)
Arlyss Repress, MD [address]  [address]  [place], [place] [address]  Main: [phone]  Fax: [phone] Pager: [phone]  Primary Care Physician:  Jeani Sow, MD Primary Gastroenterologist:  Dr. Arlyss Repress  Pre-Procedure History & Physical: HPI:  Leah Powers is a 62 y.o. female is here for a colonoscopy.   Past Medical History:  Diagnosis Date   Allergy    Arthritis    L hand, R ankle  (10/08/2016)   Bipolar 1 disorder (HCC)    Depression    Diabetes mellitus without complication (HCC)    Fibromyalgia    told that the breakout on the upper back , could be over active nerves , also treated with cream & Gabapentin- [facility] [place]    History of kidney stones    spontaneous passing   Hypertension    Hypothyroid    Pneumonia ~ 1967; 07/2016   Pyelonephritis 1997   treated with IV antibiotic     Past Surgical History:  Procedure Laterality Date   BIOPSY  11/04/2022   Procedure: BIOPSY;  Surgeon: Toney Reil, MD;  Location: [facility] ENDOSCOPY;  Service: Gastroenterology;;   BLADDER INSTILLATION  X 2   CHALAZION EXCISION Bilateral    R- eye X2 , L eye- X1   COLONOSCOPY     COLONOSCOPY WITH PROPOFOL N/A 11/04/2022   Procedure: COLONOSCOPY WITH PROPOFOL;  Surgeon: Toney Reil, MD;  Location: [facility] ENDOSCOPY;  Service: Gastroenterology;  Laterality: N/A;  Patient does not want to be the first patient.  Prefers mid-morning   ESOPHAGOGASTRODUODENOSCOPY (EGD) WITH PROPOFOL N/A 11/04/2022   Procedure: ESOPHAGOGASTRODUODENOSCOPY (EGD) WITH PROPOFOL;  Surgeon: Toney Reil, MD;  Location: [facility] ENDOSCOPY;  Service: Gastroenterology;  Laterality: N/A;   FOOT ARTHRODESIS Right 10/08/2016   Procedure: RIGHT TRIPLE ARTHRODESIS;  Surgeon: Nadara Mustard, MD;  Location: [facility] OR;  Service: Orthopedics;  Laterality: Right;   FOOT ARTHRODESIS, TRIPLE Right 10/08/2016   ankle   FRENULECTOMY, LINGUAL  1970s   LAPAROSCOPIC CHOLECYSTECTOMY   1996   PALATE SURGERY  1970s   tissue removed from upper palate as a child    URETHRAL DILATION      Prior to Admission medications   Medication Sig Start Date End Date Taking? Authorizing Provider  albuterol (VENTOLIN HFA) 108 (90 Base) MCG/ACT inhaler Inhale 2 puffs into the lungs every 6 (six) hours as needed for wheezing or shortness of breath. 07/30/22   Jeani Sow, MD  cetirizine (ZYRTEC) 5 MG tablet Take 5 mg by mouth 2 (two) times daily as needed for allergies.     [provider]  coal tar (NEUTROGENA T/GEL STUBBORN ITCH) 0.5 % shampoo Apply topically at bedtime as needed.    [provider]  dapagliflozin propanediol (FARXIGA) 5 MG TABS tablet Take 1 tablet (5 mg total) by mouth daily. 12/30/22   Jeani Sow, MD  dicyclomine (BENTYL) 10 MG capsule Take 1 capsule (10 mg total) by mouth 3 (three) times daily before meals. 11/26/22 02/24/23  Celso Amy, PA-C  docusate sodium (COLACE) 100 MG capsule Take 100 mg by mouth 3 (three) times daily.    [provider]  Elastic Bandages & Supports (MEDICAL COMPRESSION SOCKS) MISC Wear compression stockings daily with upright activity. Remove at bedtime. 04/01/21   Allwardt, Crist Infante, PA-C  ferrous sulfate 325 (65 FE) MG tablet Take 325 mg by mouth 2 (two) times a week.    [provider]  furosemide (LASIX) 20 MG tablet Take 1 tablet (20 mg total) by mouth daily. 07/30/22   Jeani Sow, MD  gabapentin (NEURONTIN) 800 MG tablet Take 1 tablet (800 mg total) by mouth 3 (three) times daily. 10/29/22   Terri Piedra, DO  Lancets [facility] DELICA PLUS LANCET30G) MISC Inject 1 Stick into the skin daily. 04/01/21   Allwardt, Crist Infante, PA-C  levothyroxine (SYNTHROID) 137 MCG tablet TAKE 1 TABLET BY MOUTH DAILY BEFORE BREAKFAST 01/23/23   Melony Overly T, PA-C  linaclotide [facility]) 290 MCG CAPS capsule Take 1 capsule (290 mcg total) by mouth daily before breakfast. 11/26/22 05/25/23  Celso Amy, PA-C   lithium carbonate 300 MG capsule TAKE THREE CAPSULES BY MOUTH EVERY NIGHT AT BEDTIME 12/22/22   Hurst, Rosey Bath T, PA-C  losartan (COZAAR) 50 MG tablet Take 1 tablet (50 mg total) by mouth daily. 07/30/22   Jeani Sow, MD  mupirocin ointment Idelle Jo) 2 % Apply topical to aa qd 10/29/22   Terri Piedra, DO  [facility] VERIO test strip 1 each by Other route daily. 12/30/22   Jeani Sow, MD  pantoprazole (PROTONIX) 40 MG tablet Take 1 tablet (40 mg total) by mouth daily. 11/26/22 02/24/23  Celso Amy, PA-C  promethazine (PHENERGAN) 25 MG tablet Take 1 tablet (25 mg total) by mouth every 6 (six) hours as needed for nausea or vomiting. 12/31/22 01/30/23  Celso Amy, PA-C  rosuvastatin (CRESTOR) 10 MG tablet Take 1 tablet (10 mg total) by mouth daily. 05/13/22   Jeani Sow, MD  Semaglutide (RYBELSUS) 7 MG TABS Take 1 tablet (7 mg total) by mouth daily. 11/19/22   Jeani Sow, MD  Simethicone (GAS-X MAXIMUM STRENGTH PO)  11/15/22   [provider]  triamcinolone cream (KENALOG) 0.1 % Apply to affected area twice a day for 2 weeks then stop 10/29/22   Terri Piedra, DO  valACYclovir (VALTREX) 1000 MG tablet Take 1,000 mg by mouth as needed.    [provider]  Vitamin D, Ergocalciferol, (DRISDOL) 1.25 MG (50000 UNIT) CAPS capsule Take 1 capsule (50,000 Units total) by mouth every 7 (seven) days. 07/31/22   Jeani Sow, MD    Allergies as of 12/31/2022 - Review Complete 12/31/2022  Allergen Reaction Noted   Eucalyptus oil Shortness Of Breath 06/12/2011   Haldol [haloperidol decanoate] Other (See Comments) 06/12/2011   Molds & smuts Shortness Of Breath and Itching 06/12/2011   Other Anaphylaxis, Shortness Of Breath, Itching, and Swelling 09/30/2016   Penicillins Anaphylaxis and Rash 06/12/2011   Bee venom Other (See Comments) 09/30/2016   Corn oil  12/31/2020   Corn-containing products Nausea And Vomiting and Other (See Comments) 06/12/2011    Demeclocycline Other (See Comments) 06/12/2011   Diflunisal Other (See Comments) 06/12/2011   Haloperidol lactate  12/31/2020   Hydrochlorothiazide  12/31/2020   Hydrocodone-acetaminophen Other (See Comments) 12/31/2020   Hydromorphone Other (See Comments) 01/17/2022   Imipramine  12/31/2020   Metformin  12/31/2020   Metformin and related  04/20/2019   Nitrofurantoin  12/31/2020   Penicillin g Other (See Comments) 12/31/2020   Sulfamethoxazole-trimethoprim  12/31/2020   Tetracycline hcl  12/31/2020   Vicodin [hydrocodone-acetaminophen] Diarrhea and Other (See Comments) 06/12/2011   Cephalexin Rash 06/12/2011   Doxycycline Rash 06/12/2011   Erythromycin Rash 06/12/2011   Macrodantin Rash 06/12/2011   Motrin [ibuprofen] Palpitations 10/02/2016   Septra [bactrim] Rash 06/12/2011   Tetracyclines & related Rash 06/12/2011   Tofranil-pm Rash 06/12/2011  Family History  Problem Relation Age of Onset   Sleep apnea Father    Hypertension Father    Diabetes Sister    Hypertension Sister    Asthma Sister    Breast cancer Maternal Grandmother 37   Cancer Paternal Grandfather    Breast cancer Maternal Aunt 1    Social History   Socioeconomic History   Marital status: Married    Spouse name: Not on file   Number of children: Not on file   Years of education: Not on file   Highest education level: Some college, no degree  Occupational History   Not on file  Tobacco Use   Smoking status: Never   Smokeless tobacco: Never  Vaping Use   Vaping status: Never Used  Substance and Sexual Activity   Alcohol use: No   Drug use: No   Sexual activity: Not Currently  Other Topics Concern   Not on file  Social History Narrative   Married, no kids, enjoys her cats.   Social Determinants of Health   Financial Resource Strain: Low Risk  (12/25/2022)   Overall Financial Resource Strain (CARDIA)    Difficulty of Paying Living Expenses: Not very hard  Food Insecurity: No Food  Insecurity (12/25/2022)   Hunger Vital Sign    Worried About Running Out of Food in the Last Year: Never true    Ran Out of Food in the Last Year: Never true  Transportation Needs: No Transportation Needs (12/25/2022)   PRAPARE - Administrator, Civil Service (Medical): No    Lack of Transportation (Non-Medical): No  Physical Activity: Insufficiently Active (12/25/2022)   Exercise Vital Sign    Days of Exercise per Week: 3 days    Minutes of Exercise per Session: 40 min  Stress: No Stress Concern Present (12/25/2022)   [employer] of Occupational Health - Occupational Stress Questionnaire    Feeling of Stress : Only a little  Social Connections: Socially Integrated (12/25/2022)   Social Connection and Isolation Panel [NHANES]    Frequency of Communication with Friends and Family: Three times a week    Frequency of Social Gatherings with Friends and Family: More than three times a week    Attends Religious Services: More than 4 times per year    Active Member of Clubs or Organizations: Yes    Attends Engineer, structural: More than 4 times per year    Marital Status: Married  Catering manager Violence: Not on file    Review of Systems: See HPI, otherwise negative ROS  Physical Exam: BP (!) 149/67   Pulse 69   Temp (!) 97 F (36.1 C) (Temporal)   Resp 18   Ht 5\' 8"  (1.727 m)   Wt 106.4 kg   LMP 07/30/2013   SpO2 99%   BMI 35.67 kg/m  General:   Alert,  pleasant and cooperative in NAD Head:  Normocephalic and atraumatic. Neck:  Supple; no masses or thyromegaly. Lungs:  Clear throughout to auscultation.    Heart:  Regular rate and rhythm. Abdomen:  Soft, nontender and nondistended. Normal bowel sounds, without guarding, and without rebound.   Neurologic:  Alert and  oriented x4;  grossly normal neurologically.  Impression/Plan: Leah Powers is here for a colonoscopy to be performed for colon cancer screening  Risks, benefits, limitations, and  alternatives regarding  colonoscopy have been reviewed with the patient.  Questions have been answered.  All parties agreeable.   Lannette Donath, MD  02/05/2023, 9:34 AM

## 2023-02-05 NOTE — Telephone Encounter (Signed)
Pt requesting call back to discuss medication.

## 2023-02-05 NOTE — Anesthesia Preprocedure Evaluation (Signed)
Anesthesia Evaluation  Patient identified by MRN, date of birth, ID band Patient awake    Reviewed: Allergy & Precautions, NPO status , Patient's Chart, lab work & pertinent test results  Airway Mallampati: III  TM Distance: <3 FB Neck ROM: full    Dental no notable dental hx.    Pulmonary asthma    Pulmonary exam normal        Cardiovascular hypertension, (-) angina negative cardio ROS Normal cardiovascular exam     Neuro/Psych  Neuromuscular disease  negative psych ROS   GI/Hepatic negative GI ROS, Neg liver ROS,neg GERD  ,,  Endo/Other  diabetesHypothyroidism    Renal/GU Renal disease  negative genitourinary   Musculoskeletal   Abdominal   Peds  Hematology negative hematology ROS (+)   Anesthesia Other Findings Past Medical History: No date: Allergy No date: Arthritis     Comment:  L hand, R ankle  (10/08/2016) No date: Bipolar 1 disorder (HCC) No date: Depression No date: Diabetes mellitus without complication (HCC) No date: Fibromyalgia     Comment:  told that the breakout on the upper back , could be over              active nerves , also treated with cream & Gabapentin-               [facility] [place]  No date: History of kidney stones     Comment:  spontaneous passing No date: Hypertension No date: Hypothyroid ~ 1967; 07/2016: Pneumonia 1997: Pyelonephritis     Comment:  treated with IV antibiotic   Past Surgical History: X 2: BLADDER INSTILLATION No date: CHALAZION EXCISION; Bilateral     Comment:  R- eye X2 , L eye- X1 No date: COLONOSCOPY 10/08/2016: FOOT ARTHRODESIS; Right     Comment:  Procedure: RIGHT TRIPLE ARTHRODESIS;  Surgeon: Nadara Mustard, MD;  Location: [facility] OR;  Service: Orthopedics;                Laterality: Right; 10/08/2016: FOOT ARTHRODESIS, TRIPLE; Right     Comment:  ankle 1970s: FRENULECTOMY, LINGUAL 1996: LAPAROSCOPIC CHOLECYSTECTOMY 1970s:  PALATE SURGERY     Comment:  tissue removed from upper palate as a child  No date: URETHRAL DILATION     Reproductive/Obstetrics negative OB ROS                              Anesthesia Physical Anesthesia Plan  ASA: 3  Anesthesia Plan: General   Post-op Pain Management:    Induction: Intravenous  PONV Risk Score and Plan: Propofol infusion and TIVA  Airway Management Planned: Natural Airway and Nasal Cannula  Additional Equipment:   Intra-op Plan:   Post-operative Plan:   Informed Consent: I have reviewed the patients History and Physical, chart, labs and discussed the procedure including the risks, benefits and alternatives for the proposed anesthesia with the patient or authorized representative who has indicated his/her understanding and acceptance.     Dental Advisory Given  Plan Discussed with: Anesthesiologist, CRNA and Surgeon  Anesthesia Plan Comments: (Patient consented for risks of anesthesia including but not limited to:  - adverse reactions to medications - risk of airway placement if required - damage to eyes, teeth, lips or other oral mucosa - nerve damage due to positioning  - sore throat or hoarseness - Damage to heart,  brain, nerves, lungs, other parts of body or loss of life  Patient voiced understanding.)        Anesthesia Quick Evaluation

## 2023-02-05 NOTE — Telephone Encounter (Signed)
Dr. Allegra Lai wanted patient to come back on 02/06/2023 for her colonosocpy because she was not cleaned out today. Patient declined and wants to postpone it a couple months out. She would like a call back to get it reschedule according to [place]

## 2023-02-05 NOTE — Op Note (Signed)
[facility] Gastroenterology Patient Name: Leah Powers Procedure Date: 02/05/2023 9:15 AM MRN: [id number] Account #: [id number] Date of Birth: 1960/12/11 Admit Type: Outpatient Age: 62 Room: [facility] ENDO ROOM 3 Gender: Female Note Status: Finalized Instrument Name: Prentice Docker [id number] Procedure:             Colonoscopy Indications:           Screening for colorectal malignant neoplasm, Screening                         for colorectal malignant neoplasm, inadequate bowel                         prep on last colonoscopy (more recent than 10 years                         ago), Last colonoscopy: August 2024 Providers:             Toney Reil MD, MD Referring MD:          Jeani Sow (Referring MD) Medicines:             General Anesthesia Complications:         No immediate complications. Estimated blood loss: None. Procedure:             Pre-Anesthesia Assessment:                        - Prior to the procedure, a History and Physical was                         performed, and patient medications and allergies were                         reviewed. The patient is competent. The risks and                         benefits of the procedure and the sedation options and                         risks were discussed with the patient. All questions                         were answered and informed consent was obtained.                         Patient identification and proposed procedure were                         verified by the physician, the nurse, the                         anesthesiologist, the anesthetist and the technician                         in the pre-procedure area in the procedure room in the                         endoscopy suite. Mental Status Examination: alert and  oriented. Airway Examination: normal oropharyngeal                         airway and neck mobility. Respiratory Examination:                         clear  to auscultation. CV Examination: normal.                         Prophylactic Antibiotics: The patient does not require                         prophylactic antibiotics. Prior Anticoagulants: The                         patient has taken no anticoagulant or antiplatelet                         agents except for aspirin. ASA Grade Assessment: III -                         A patient with severe systemic disease. After                         reviewing the risks and benefits, the patient was                         deemed in satisfactory condition to undergo the                         procedure. The anesthesia plan was to use general                         anesthesia. Immediately prior to administration of                         medications, the patient was re-assessed for adequacy                         to receive sedatives. The heart rate, respiratory                         rate, oxygen saturations, blood pressure, adequacy of                         pulmonary ventilation, and response to care were                         monitored throughout the procedure. The physical                         status of the patient was re-assessed after the                         procedure.                        After obtaining informed consent, the colonoscope was  passed under direct vision. Throughout the procedure,                         the patient's blood pressure, pulse, and oxygen                         saturations were monitored continuously. The                         Colonoscope was introduced through the anus with the                         intention of advancing to the cecum. The scope was                         advanced to the descending colon before the procedure                         was aborted. Medications were given. The colonoscopy                         was extremely difficult due to inadequate bowel prep.                         The patient  tolerated the procedure well. The quality                         of the bowel preparation was poor. The colonoscopy was                         aborted due to inadequate bowel prep. Findings:      The perianal and digital rectal examinations were normal. Pertinent       negatives include normal sphincter tone and no palpable rectal lesions.      Copious quantities of semi-liquid stool was found in the rectum, in the       sigmoid colon and in the descending colon, precluding visualization. Impression:            - Preparation of the colon was poor.                        - The procedure was aborted due to inadequate bowel                         prep.                        - Stool in the rectum, in the sigmoid colon and in the                         descending colon.                        - No specimens collected. Recommendation:        - Discharge patient to home (with escort).                        - Clear liquid diet today.                        -  Continue present medications.                        - Repeat colonoscopy tomorrow with repeat prep or                         within 3 months with 2 day large volume prep because                         the bowel preparation was suboptimal. Procedure Code(s):     --- Professional ---                        N8295, 53, Colorectal cancer screening; colonoscopy on                         individual not meeting criteria for high risk Diagnosis Code(s):     --- Professional ---                        Z12.11, Encounter for screening for malignant neoplasm                         of colon CPT copyright 2022 American Medical Association. All rights reserved. The codes documented in this report are preliminary and upon coder review may  be revised to meet current compliance requirements. Dr. Libby Maw Toney Reil MD, MD 02/05/2023 9:49:02 AM This report has been signed electronically. Number of Addenda: 0 Note Initiated On:  02/05/2023 9:15 AM Total Procedure Duration: 0 hours 5 minutes 0 seconds  Estimated Blood Loss:  Estimated blood loss: none.      [facility]

## 2023-02-05 NOTE — Anesthesia Procedure Notes (Signed)
Date/Time: 02/05/2023 9:35 AM  Performed by: Ginger Carne, CRNAPre-anesthesia Checklist: Patient identified, Emergency Drugs available, Suction available, Patient being monitored and Timeout performed Patient Re-evaluated:Patient Re-evaluated prior to induction Oxygen Delivery Method: Nasal cannula Preoxygenation: Pre-oxygenation with 100% oxygen Induction Type: IV induction

## 2023-02-05 NOTE — Transfer of Care (Signed)
Immediate Anesthesia Transfer of Care Note  Patient: Leah Powers  Procedure(s) Performed: COLONOSCOPY WITH PROPOFOL  Patient Location: Endoscopy Unit  Anesthesia Type:General  Level of Consciousness: sedated and drowsy  Airway & Oxygen Therapy: Patient Spontanous Breathing  Post-op Assessment: Report given to RN and Post -op Vital signs reviewed and stable  Post vital signs: Reviewed and stable  Last Vitals:  Vitals Value Taken Time  BP 113/59 02/05/23 0949  Temp    Pulse 69 02/05/23 0949  Resp 14 02/05/23 0949  SpO2 92 % 02/05/23 0949    Last Pain:  Vitals:   02/05/23 0949  TempSrc:   PainSc: Asleep         Complications: No notable events documented.

## 2023-02-06 ENCOUNTER — Ambulatory Visit: Admission: RE | Admit: 2023-02-06 | Payer: Medicaid Other | Source: Home / Self Care | Admitting: Gastroenterology

## 2023-02-06 ENCOUNTER — Telehealth: Payer: Self-pay

## 2023-02-06 ENCOUNTER — Encounter: Payer: Self-pay | Admitting: Gastroenterology

## 2023-02-06 ENCOUNTER — Encounter: Admission: RE | Payer: Self-pay | Source: Home / Self Care

## 2023-02-06 SURGERY — COLONOSCOPY WITH PROPOFOL
Anesthesia: General

## 2023-02-06 NOTE — Telephone Encounter (Signed)
Patient taking Linzess 290 daily along stool softener . She took a dulcolax last evening and that helped with getting her bowels to move. She stated she probably should do the complete bowel prep a few days prior to the colonoscopy and then do regular bowel prep the day before-she said the bowel prep always has a delayed response and would like to try the above for next colonoscopy- patient will call back to schedule the Colonoscopy in a few months- she stated her sister is having surgery and will help out with that but assured me she would call bach after the first of the year.

## 2023-02-20 ENCOUNTER — Other Ambulatory Visit: Payer: Self-pay | Admitting: Physician Assistant

## 2023-02-20 DIAGNOSIS — K2960 Other gastritis without bleeding: Secondary | ICD-10-CM

## 2023-02-20 DIAGNOSIS — K581 Irritable bowel syndrome with constipation: Secondary | ICD-10-CM

## 2023-02-26 ENCOUNTER — Ambulatory Visit: Payer: Medicaid Other | Admitting: Dermatology

## 2023-02-26 ENCOUNTER — Encounter: Payer: Self-pay | Admitting: Dermatology

## 2023-02-26 DIAGNOSIS — L280 Lichen simplex chronicus: Secondary | ICD-10-CM

## 2023-02-26 MED ORDER — GABAPENTIN 800 MG PO TABS: 800.0000 mg | ORAL_TABLET | Freq: Three times a day (TID) | ORAL | 3 refills | Status: AC

## 2023-02-26 NOTE — Patient Instructions (Addendum)
Hello  Leah Powers,  Thank you for visiting my office today. 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 Below is a summary of the key points from our discussion today:  - Current Medication Regimen: Please continue with your existing medications as prescribed:   - Gabapentin: 800 mg, to be taken three times a day.   - Triamcinolone Cream: Apply twice daily for two weeks. Following this period, switch to:   - Tacrolimus Ointment: Apply twice daily for two weeks. Continue this alternating pattern every two weeks.  - Additional Skincare:   - CeraVe Anti-Itch Lotion: Use as an overall moisturizer to aid in further improving your itch.  - Follow-Up:   - We have scheduled a follow-up appointment in 6 months to monitor your progress.  Should you require any refills or if you have any concerns before our next scheduled visit, please do not hesitate to call our office.  Best regards,  Dr. Langston Reusing, Dermatology  Important Information  Due to recent changes in healthcare laws, you may see results of your pathology and/or laboratory studies on MyChart before the doctors have had a chance to review them. We understand that in some cases there may be results that are confusing or concerning to you. Please understand that not all results are received at the same time and often the doctors may need to interpret multiple results in order to provide you with the best plan of care or course of treatment. Therefore, we ask that you please give [place] 2 business days to thoroughly review all your results before contacting the office for clarification. Should we see a critical lab result, you will be contacted sooner.   If You Need Anything After Your Visit  If you have any questions or concerns for your doctor, please call our main line at [phone] If no one answers, please leave a voicemail as directed and we will return  your call as soon as possible. Messages left after 4 pm will be answered the following business day.   You may also send [place] a message via MyChart. We typically respond to MyChart messages within 1-2 business days.  For prescription refills, please ask your pharmacy to contact our office. Our fax number is [phone].  If you have an urgent issue when the clinic is closed that cannot wait until the next business day, you can page your doctor at the number below.    Please note that while we do our best to be available for urgent issues outside of office hours, we are not available 24/7.   If you have an urgent issue and are unable to reach [place], you may choose to seek medical care at your doctor's office, retail clinic, urgent care center, or emergency room.  If you have a medical emergency, please immediately call 911 or go to the emergency department. In the event of inclement weather, please call our main line at [phone] for an update on the status of any delays or closures.  Dermatology Medication Tips: Please keep the boxes that topical medications come in in order to help keep track of the instructions about where and how to use these. Pharmacies typically print the medication instructions only on the boxes and not directly on the medication tubes.   If your medication is too expensive, please contact our office at [phone] or send [place] a message through MyChart.   We are unable to tell what your co-pay  for medications will be in advance as this is different depending on your insurance coverage. However, we may be able to find a substitute medication at lower cost or fill out paperwork to get insurance to cover a needed medication.   If a prior authorization is required to get your medication covered by your insurance company, please allow [place] 1-2 business days to complete this process.  Drug prices often vary depending on where the prescription is filled and some pharmacies may  offer cheaper prices.  The website www.goodrx.com contains coupons for medications through different pharmacies. The prices here do not account for what the cost may be with help from insurance (it may be cheaper with your insurance), but the website can give you the price if you did not use any insurance.  - You can print the associated coupon and take it with your prescription to the pharmacy.  - You may also stop by our office during regular business hours and pick up a GoodRx coupon card.  - If you need your prescription sent electronically to a different pharmacy, notify our office through [facility] or by phone at [phone]

## 2023-02-26 NOTE — Progress Notes (Signed)
   Follow-Up Visit   Subjective  Leah Powers is a 62 y.o. female who presents for the following: neurodermatitis  Patient present today for follow up visit for neurodermatitis & deb derm. Patient was last evaluated on 10/29/22. She stated that she was alternating the tacrolimus ointment and triamcinolone cream every 2 weeks as well as taking gabapentin daily and using mupirocin. Patient reports sxs are better. Patient denies medication changes.  The following portions of the chart were reviewed this encounter and updated as appropriate: medications, allergies, medical history  Review of Systems:  No other skin or systemic complaints except as noted in HPI or Assessment and Plan.  Objective  Well appearing patient in no apparent distress; mood and affect are within normal limits.   A focused examination was performed of the following areas: upper body   Relevant exam findings are noted in the Assessment and Plan.                  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 Itch severity reduced to 0-1/10.  - Plan:  Continue gabapentin 800mg  TID (prescription refilled). Continue alternating topical treatments: apply triamcinolone cream BID for 2 weeks, followed by tacrolimus ointment BID for 2 weeks. Recommend CeraVe Anti-Itch Lotion as an additional moisturizer. Follow-up in 6 months.   No follow-ups on file.    Documentation: I have reviewed the above documentation for accuracy and completeness, and I agree with the above.   I, Shirron Marcha Solders, CMA, am acting as scribe for [employer], DO.   Langston Reusing, DO

## 2023-03-13 ENCOUNTER — Ambulatory Visit (INDEPENDENT_AMBULATORY_CARE_PROVIDER_SITE_OTHER): Payer: Medicaid Other | Admitting: Podiatry

## 2023-03-13 ENCOUNTER — Encounter: Payer: Self-pay | Admitting: Podiatry

## 2023-03-13 DIAGNOSIS — B351 Tinea unguium: Secondary | ICD-10-CM

## 2023-03-13 DIAGNOSIS — M79676 Pain in unspecified toe(s): Secondary | ICD-10-CM | POA: Diagnosis not present

## 2023-03-13 DIAGNOSIS — E1140 Type 2 diabetes mellitus with diabetic neuropathy, unspecified: Secondary | ICD-10-CM | POA: Diagnosis not present

## 2023-03-13 DIAGNOSIS — E1149 Type 2 diabetes mellitus with other diabetic neurological complication: Secondary | ICD-10-CM | POA: Diagnosis not present

## 2023-03-13 NOTE — Progress Notes (Signed)
This patient returns to my office for at risk foot care.  This patient requires this care by a professional since this patient will be at risk due to having type 2 diabetes. This patient is unable to cut nails herself since the patient cannot reach her nails.These nails are painful walking and wearing shoes.  This patient presents for at risk foot care today.  General Appearance  Alert, conversant and in no acute stress.  Vascular  Dorsalis pedis and posterior tibial  pulses are palpable  bilaterally.  Capillary return is within normal limits  bilaterally. Temperature is within normal limits  bilaterally.  Neurologic  Senn-Weinstein monofilament wire test within normal limits  bilaterally. Muscle power within normal limits bilaterally.  Nails Thick disfigured discolored nails with subungual debris  from hallux to fifth toes bilaterally. No evidence of bacterial infection or drainage bilaterally.  Orthopedic  No limitations of motion  feet .  No crepitus or effusions noted.  No bony pathology or digital deformities noted.  Skin  normotropic skin with no porokeratosis noted bilaterally.  No signs of infections or ulcers noted.     Onychomycosis  Pain in right toes  Pain in left toes  Consent was obtained for treatment procedures.   Mechanical debridement of nails 1-5  bilaterally performed with a nail nipper.  Filed with dremel without incident.    Return office visit    3 months                  Told patient to return for periodic foot care and evaluation due to potential at risk complications.   Shaunta Oncale DPM   

## 2023-03-18 ENCOUNTER — Other Ambulatory Visit: Payer: Self-pay | Admitting: Family Medicine

## 2023-03-18 DIAGNOSIS — E119 Type 2 diabetes mellitus without complications: Secondary | ICD-10-CM

## 2023-04-20 ENCOUNTER — Other Ambulatory Visit: Payer: Self-pay | Admitting: Physician Assistant

## 2023-04-24 ENCOUNTER — Other Ambulatory Visit: Payer: Self-pay | Admitting: Physician Assistant

## 2023-04-24 ENCOUNTER — Telehealth: Payer: Self-pay | Admitting: Physician Assistant

## 2023-04-24 DIAGNOSIS — F319 Bipolar disorder, unspecified: Secondary | ICD-10-CM

## 2023-04-24 DIAGNOSIS — E032 Hypothyroidism due to medicaments and other exogenous substances: Secondary | ICD-10-CM

## 2023-04-24 NOTE — Telephone Encounter (Signed)
 Lab order entered at [facility] for lithium , TSH, and BMP per Ammon Bales.  Printed labs and patient notified she can come pick up orders.

## 2023-04-24 NOTE — Telephone Encounter (Signed)
 Pt called requesting labs for her lithium  level and tsh level. She would like to come pick them up today. She has an appointment at the lab on Monday. The lab told her she needed to bring in paper copy of the lab. Please call her  at [phone]

## 2023-04-24 NOTE — Telephone Encounter (Signed)
 Patient returned call and she will be going to [facility] on [address]

## 2023-04-24 NOTE — Telephone Encounter (Signed)
 LVM to [facility]. What lab is she going to use?

## 2023-04-27 ENCOUNTER — Other Ambulatory Visit: Payer: Self-pay | Admitting: Family Medicine

## 2023-04-27 ENCOUNTER — Telehealth: Payer: Self-pay | Admitting: Family Medicine

## 2023-04-27 DIAGNOSIS — I10 Essential (primary) hypertension: Secondary | ICD-10-CM

## 2023-04-27 DIAGNOSIS — D508 Other iron deficiency anemias: Secondary | ICD-10-CM

## 2023-04-27 DIAGNOSIS — E119 Type 2 diabetes mellitus without complications: Secondary | ICD-10-CM

## 2023-04-27 DIAGNOSIS — E1169 Type 2 diabetes mellitus with other specified complication: Secondary | ICD-10-CM | POA: Insufficient documentation

## 2023-04-27 DIAGNOSIS — E559 Vitamin D deficiency, unspecified: Secondary | ICD-10-CM

## 2023-04-27 DIAGNOSIS — E032 Hypothyroidism due to medicaments and other exogenous substances: Secondary | ICD-10-CM | POA: Diagnosis not present

## 2023-04-27 DIAGNOSIS — F319 Bipolar disorder, unspecified: Secondary | ICD-10-CM | POA: Diagnosis not present

## 2023-04-27 NOTE — Telephone Encounter (Signed)
 Patient has a 6 month f/u ov on 06/30/23 and wanted to have necessary labs completed prior to visit. Please advise.

## 2023-04-27 NOTE — Telephone Encounter (Signed)
 Please schedule patient for labs, orders placed.

## 2023-04-28 LAB — BASIC METABOLIC PANEL
BUN/Creatinine Ratio: 24 (ref 12–28)
BUN: 17 mg/dL (ref 8–27)
CO2: 25 mmol/L (ref 20–29)
Calcium: 9.4 mg/dL (ref 8.7–10.3)
Chloride: 108 mmol/L — ABNORMAL HIGH (ref 96–106)
Creatinine, Ser: 0.7 mg/dL (ref 0.57–1.00)
Glucose: 100 mg/dL — ABNORMAL HIGH (ref 70–99)
Potassium: 4.3 mmol/L (ref 3.5–5.2)
Sodium: 142 mmol/L (ref 134–144)
eGFR: 98 mL/min/{1.73_m2} (ref >59)

## 2023-04-28 LAB — LITHIUM LEVEL: Lithium Lvl: 0.5 mmol/L (ref 0.5–1.2)

## 2023-04-28 LAB — TSH: TSH: 1.06 u[IU]/mL (ref 0.450–4.500)

## 2023-04-28 NOTE — Progress Notes (Signed)
Please call her with lab results. Lithium level, TSH and kidney functions are all in nl range. Continue same treatment.

## 2023-04-28 NOTE — Telephone Encounter (Signed)
Copied from CRM [id number]. Topic: Referral - Question >> Apr 28, 2023  3:44 PM Melissa C wrote: Reason for CRM: patient was informed by [facility] that she would need a referral from Dr. Ruthine Dose to make an appointment. Patient doesn't have fax number, however she gave address and phone number- [address] [place]. [address] in [place] [phone]. Please advise. Thank you

## 2023-04-29 ENCOUNTER — Other Ambulatory Visit: Payer: Self-pay | Admitting: *Deleted

## 2023-04-29 DIAGNOSIS — Z1231 Encounter for screening mammogram for malignant neoplasm of breast: Secondary | ICD-10-CM

## 2023-04-29 NOTE — Telephone Encounter (Signed)
Order placed as requested.

## 2023-05-07 ENCOUNTER — Other Ambulatory Visit: Payer: Self-pay | Admitting: Family Medicine

## 2023-05-08 ENCOUNTER — Other Ambulatory Visit (HOSPITAL_COMMUNITY): Payer: Self-pay

## 2023-05-12 ENCOUNTER — Telehealth: Payer: Self-pay

## 2023-05-12 NOTE — Telephone Encounter (Signed)
 Pharmacy Patient Advocate Encounter   Received notification from [facility] that Prior Authorization for Ozempic has been APPROVED from 05/12/2023 to 05/11/2024    PA #/Case ID/Reference #: [id number]

## 2023-05-12 NOTE — Telephone Encounter (Signed)
 Pharmacy Patient Advocate Encounter   Received notification from Onbase that prior authorization for Ozempic (0.25 or 0.5 MG/DOSE) 2MG /3ML pen-injectors is required/requested.   Insurance verification completed.   The patient is insured through [facility] .   Per test claim: PA required; PA submitted to above mentioned insurance via CoverMyMeds Key/confirmation #/EOC [id number] Status is pending

## 2023-05-20 ENCOUNTER — Telehealth: Payer: Self-pay | Admitting: Physician Assistant

## 2023-05-20 ENCOUNTER — Encounter: Payer: Self-pay | Admitting: Physician Assistant

## 2023-05-20 DIAGNOSIS — K581 Irritable bowel syndrome with constipation: Secondary | ICD-10-CM

## 2023-05-20 DIAGNOSIS — K2960 Other gastritis without bleeding: Secondary | ICD-10-CM

## 2023-05-20 DIAGNOSIS — K5904 Chronic idiopathic constipation: Secondary | ICD-10-CM

## 2023-05-20 MED ORDER — PANTOPRAZOLE SODIUM 40 MG PO TBEC: 40.0000 mg | DELAYED_RELEASE_TABLET | Freq: Every day | ORAL | 3 refills | Status: AC

## 2023-05-20 MED ORDER — LINACLOTIDE 290 MCG PO CAPS: 290.0000 ug | ORAL_CAPSULE | Freq: Every day | ORAL | 3 refills | Status: AC

## 2023-05-20 MED ORDER — DICYCLOMINE HCL 10 MG PO CAPS
10.0000 mg | ORAL_CAPSULE | Freq: Three times a day (TID) | ORAL | 11 refills | Status: AC
Start: 2023-05-20 — End: 2024-05-14

## 2023-05-20 NOTE — Telephone Encounter (Signed)
 Patient requested medication refills

## 2023-06-03 ENCOUNTER — Other Ambulatory Visit (HOSPITAL_COMMUNITY): Payer: Self-pay

## 2023-06-03 ENCOUNTER — Telehealth: Payer: Self-pay

## 2023-06-03 NOTE — Telephone Encounter (Signed)
 Pharmacy Patient Advocate Encounter   Received notification from CoverMyMeds that prior authorization for Farxiga 5mg  is required/requested.    Per test claim: PA required

## 2023-06-04 ENCOUNTER — Other Ambulatory Visit (HOSPITAL_COMMUNITY): Payer: Self-pay

## 2023-06-04 ENCOUNTER — Telehealth: Payer: Self-pay

## 2023-06-04 NOTE — Telephone Encounter (Signed)
 Pharmacy Patient Advocate Encounter   Received notification from Pt Calls Messages that prior authorization for Farxiga 5 mg is required/requested.   Insurance verification completed.   The patient is insured through [facility] MEDICAID .   Per test claim: PA required; PA submitted to above mentioned insurance via CoverMyMeds Key/confirmation #/EOC [id number] Status is pending

## 2023-06-04 NOTE — Telephone Encounter (Signed)
 Noted.

## 2023-06-10 ENCOUNTER — Encounter: Payer: Self-pay | Admitting: Physician Assistant

## 2023-06-10 ENCOUNTER — Ambulatory Visit (INDEPENDENT_AMBULATORY_CARE_PROVIDER_SITE_OTHER): Payer: Medicaid Other | Admitting: Physician Assistant

## 2023-06-10 DIAGNOSIS — E032 Hypothyroidism due to medicaments and other exogenous substances: Secondary | ICD-10-CM | POA: Diagnosis not present

## 2023-06-10 DIAGNOSIS — F319 Bipolar disorder, unspecified: Secondary | ICD-10-CM | POA: Diagnosis not present

## 2023-06-10 NOTE — Progress Notes (Signed)
 [facility] Med Check  Patient ID: Leah Powers,  MRN: [id number]  PCP: Jeani Sow, MD  Date of Evaluation: 06/10/2023 Time spent:30 minutes  Chief Complaint:  Chief Complaint   Follow-up    HISTORY/CURRENT STATUS: HPI for routine med check.  Her meds are working well. Patient is able to enjoy things.  Energy and motivation are good.  No extreme sadness, tearfulness, or feelings of hopelessness.  Sleeps well most of the time. ADLs and personal hygiene are normal.   Denies any changes in concentration, making decisions, or remembering things.  Appetite has not changed.  Weight is stable.  No c/o anxiety.  Denies suicidal or homicidal thoughts.  Her husband has been retired for almost a year. She's having to get used to him being home.  She has an appointment for therapy next month, she feels like she needs help navigating this new season of life.  Patient denies increased energy with decreased need for sleep, increased talkativeness, racing thoughts, impulsivity or risky behaviors, increased spending, increased libido, grandiosity, increased irritability or anger, paranoia, or hallucinations.  Denies dizziness, syncope, seizures, numbness, tingling, tremor, tics, unsteady gait, slurred speech, confusion. Denies muscle or joint pain, stiffness, or dystonia. Denies unexplained weight loss, frequent infections, or sores that heal slowly.  No polyphagia, polydipsia, or polyuria. Denies visual changes or paresthesias.   Individual Medical History/ Review of Systems: Changes? :No      Past medications for mental health diagnoses include: Prolixin, Haldol, Ativan, lithium, Xanax, Synthroid, gabapentin  Allergies: Eucalyptus oil, Haldol [haloperidol decanoate], Molds & smuts, Other, Penicillins, Bee venom, Corn oil, Corn-containing products, Demeclocycline, Diflunisal, Haloperidol lactate, Hydrochlorothiazide, Hydrocodone-acetaminophen, Hydromorphone, Imipramine, Metformin, Metformin  and related, Nitrofurantoin, Penicillin g, Sulfamethoxazole-trimethoprim, Tetracycline hcl, Vicodin [hydrocodone-acetaminophen], Cephalexin, Doxycycline, Erythromycin, Macrodantin, Motrin [ibuprofen], Septra [bactrim], Tetracyclines & related, and Tofranil-pm  Current Medications:  Current Outpatient Medications:    albuterol (VENTOLIN HFA) 108 (90 Base) MCG/ACT inhaler, Inhale 2 puffs into the lungs every 6 (six) hours as needed for wheezing or shortness of breath., Disp: 1 each, Rfl: 2   dapagliflozin propanediol (FARXIGA) 5 MG TABS tablet, Take 1 tablet (5 mg total) by mouth daily., Disp: 90 tablet, Rfl: 3   dicyclomine (BENTYL) 10 MG capsule, Take 1 capsule (10 mg total) by mouth 3 (three) times daily before meals., Disp: 90 capsule, Rfl: 11   docusate sodium (COLACE) 100 MG capsule, Take 100 mg by mouth 3 (three) times daily., Disp: , Rfl:    Elastic Bandages & Supports (MEDICAL COMPRESSION SOCKS) MISC, Wear compression stockings daily with upright activity. Remove at bedtime., Disp: 2 each, Rfl: 0   furosemide (LASIX) 20 MG tablet, Take 1 tablet (20 mg total) by mouth daily., Disp: 90 tablet, Rfl: 3   gabapentin (NEURONTIN) 800 MG tablet, Take 1 tablet (800 mg total) by mouth 3 (three) times daily., Disp: 270 tablet, Rfl: 3   Lancets (ONETOUCH DELICA PLUS LANCET30G) MISC, Inject 1 Stick into the skin daily., Disp: 100 each, Rfl: 2   levothyroxine (SYNTHROID) 137 MCG tablet, TAKE 1 TABLET BY MOUTH DAILY BEFORE BREAKFAST, Disp: 90 tablet, Rfl: 0   linaclotide (LINZESS) 290 MCG CAPS capsule, Take 1 capsule (290 mcg total) by mouth daily before breakfast., Disp: 90 capsule, Rfl: 3   lithium carbonate 300 MG capsule, TAKE THREE CAPSULES BY MOUTH EVERY NIGHT AT BEDTIME, Disp: 90 capsule, Rfl: 5   losartan (COZAAR) 50 MG tablet, Take 1 tablet (50 mg total) by mouth daily., Disp: 90 tablet, Rfl: 3  mupirocin ointment (BACTROBAN) 2 %, Apply topical to aa qd, Disp: 22 g, Rfl: 3   ONETOUCH VERIO test  strip, 1 each by Other route daily., Disp: 100 each, Rfl: 3   pantoprazole (PROTONIX) 40 MG tablet, Take 1 tablet (40 mg total) by mouth daily., Disp: 90 tablet, Rfl: 3   rosuvastatin (CRESTOR) 10 MG tablet, TAKE 1 TABLET BY MOUTH DAILY, Disp: 90 tablet, Rfl: 3   Semaglutide (RYBELSUS) 7 MG TABS, TAKE 1 TABLET BY MOUTH DAILY, Disp: 90 tablet, Rfl: 0   Simethicone (GAS-X MAXIMUM STRENGTH PO), , Disp: , Rfl:    tacrolimus (PROTOPIC) 0.1 % ointment, Apply 1 Application topically 2 (two) times daily. Use BID for 2 weeks on the break from Triamcinolone cream., Disp: , Rfl:    triamcinolone cream (KENALOG) 0.1 %, Apply to affected area twice a day for 2 weeks then stop, Disp: 453.6 g, Rfl: 3   cetirizine (ZYRTEC) 5 MG tablet, Take 5 mg by mouth 2 (two) times daily as needed for allergies. , Disp: , Rfl:    coal tar (NEUTROGENA T/GEL STUBBORN ITCH) 0.5 % shampoo, Apply topically at bedtime as needed., Disp: , Rfl:    Vitamin D, Ergocalciferol, (DRISDOL) 1.25 MG (50000 UNIT) CAPS capsule, Take 1 capsule (50,000 Units total) by mouth every 7 (seven) days., Disp: 12 capsule, Rfl: 1 Medication Side Effects: none  Family Medical/ Social History: Changes? no  MENTAL HEALTH EXAM:  Last menstrual period 07/30/2013.There is no height or weight on file to calculate BMI.  General Appearance: Casual, Well Groomed, and Obese  Eye Contact:  Good  Speech:  Clear and Coherent, Normal Rate, Talkative, and giggly which is normal for her  Volume:  Normal  Mood:  Euthymic  Affect:  Congruent  Thought Process:  Goal Directed and Descriptions of Associations: Circumstantial  Orientation:  Full (Time, Place, and Person)  Thought Content: Logical   Suicidal Thoughts:  No  Homicidal Thoughts:  No  Memory:  WNL  Judgement:  Good  Insight:  Good  Psychomotor Activity:  Normal  Concentration:  Concentration: Good and Attention Span: Good  Recall:  Good  Fund of Knowledge: Good  Language: Good  Assets:  Desire for  Improvement Financial Resources/Insurance Housing Resilience Social Support Transportation  ADL's:  Intact  Cognition: WNL  Prognosis:  Good   Labs 04/27/2023 TSH 1.060 Lithium 0.5 BMP  glu 100, Cl 108  DIAGNOSES:    ICD-10-CM   1. 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  Gabapentin filled 04/01/2023. I provided 30 minutes of face to face time during this encounter, including time spent before and after the visit in records review, medical decision making, counseling pertinent to today's visit, and charting.   Patient is doing well so no changes are needed.  She is aware that we can increase the lithium if needed to get her between 0.8-1.2, but since she is stable and has been on lithium for a long time we agree that it is not necessary to increase the dose at this time.  Continue gabapentin 800 mg, 1 p.o. 3 times daily. continue levothyroxine 137 mcg q am. Continue lithium 300 mg, 3 p.o. nightly. She has a new patient appointment with Lieutenant Diego, [place] next month. Return in 6 months.   Melony Overly, PA-C

## 2023-06-12 ENCOUNTER — Ambulatory Visit: Payer: Medicaid Other | Admitting: Podiatry

## 2023-06-12 ENCOUNTER — Encounter: Payer: Self-pay | Admitting: Podiatry

## 2023-06-12 DIAGNOSIS — B351 Tinea unguium: Secondary | ICD-10-CM

## 2023-06-12 DIAGNOSIS — E1140 Type 2 diabetes mellitus with diabetic neuropathy, unspecified: Secondary | ICD-10-CM

## 2023-06-12 DIAGNOSIS — E1149 Type 2 diabetes mellitus with other diabetic neurological complication: Secondary | ICD-10-CM | POA: Diagnosis not present

## 2023-06-12 DIAGNOSIS — M79676 Pain in unspecified toe(s): Secondary | ICD-10-CM

## 2023-06-12 NOTE — Progress Notes (Signed)
This patient returns to my office for at risk foot care.  This patient requires this care by a professional since this patient will be at risk due to having type 2 diabetes. This patient is unable to cut nails herself since the patient cannot reach her nails.These nails are painful walking and wearing shoes.  This patient presents for at risk foot care today.  General Appearance  Alert, conversant and in no acute stress.  Vascular  Dorsalis pedis and posterior tibial  pulses are palpable  bilaterally.  Capillary return is within normal limits  bilaterally. Temperature is within normal limits  bilaterally.  Neurologic  Senn-Weinstein monofilament wire test within normal limits  bilaterally. Muscle power within normal limits bilaterally.  Nails Thick disfigured discolored nails with subungual debris  from hallux to fifth toes bilaterally. No evidence of bacterial infection or drainage bilaterally.  Orthopedic  No limitations of motion  feet .  No crepitus or effusions noted.  No bony pathology or digital deformities noted.  Skin  normotropic skin with no porokeratosis noted bilaterally.  No signs of infections or ulcers noted.     Onychomycosis  Pain in right toes  Pain in left toes  Consent was obtained for treatment procedures.   Mechanical debridement of nails 1-5  bilaterally performed with a nail nipper.  Filed with dremel without incident.    Return office visit    3 months                  Told patient to return for periodic foot care and evaluation due to potential at risk complications.   Shaunta Oncale DPM   

## 2023-06-18 ENCOUNTER — Other Ambulatory Visit: Payer: Self-pay | Admitting: Family Medicine

## 2023-06-18 DIAGNOSIS — E119 Type 2 diabetes mellitus without complications: Secondary | ICD-10-CM

## 2023-06-22 ENCOUNTER — Encounter: Payer: Self-pay | Admitting: Family Medicine

## 2023-06-22 ENCOUNTER — Other Ambulatory Visit (INDEPENDENT_AMBULATORY_CARE_PROVIDER_SITE_OTHER): Payer: Medicaid Other

## 2023-06-22 DIAGNOSIS — E785 Hyperlipidemia, unspecified: Secondary | ICD-10-CM | POA: Diagnosis not present

## 2023-06-22 DIAGNOSIS — E1169 Type 2 diabetes mellitus with other specified complication: Secondary | ICD-10-CM | POA: Diagnosis not present

## 2023-06-22 DIAGNOSIS — I10 Essential (primary) hypertension: Secondary | ICD-10-CM

## 2023-06-22 DIAGNOSIS — E119 Type 2 diabetes mellitus without complications: Secondary | ICD-10-CM

## 2023-06-22 DIAGNOSIS — D508 Other iron deficiency anemias: Secondary | ICD-10-CM | POA: Diagnosis not present

## 2023-06-22 DIAGNOSIS — E559 Vitamin D deficiency, unspecified: Secondary | ICD-10-CM | POA: Diagnosis not present

## 2023-06-22 LAB — CBC WITH DIFFERENTIAL/PLATELET
Basophils Absolute: 0.1 10*3/uL (ref 0.0–0.1)
Basophils Relative: 0.9 % (ref 0.0–3.0)
Eosinophils Absolute: 0.4 10*3/uL (ref 0.0–0.7)
Eosinophils Relative: 6.1 % — ABNORMAL HIGH (ref 0.0–5.0)
HCT: 43.1 % (ref 36.0–46.0)
Hemoglobin: 13.7 g/dL (ref 12.0–15.0)
Lymphocytes Relative: 24.9 % (ref 12.0–46.0)
Lymphs Abs: 1.7 10*3/uL (ref 0.7–4.0)
MCHC: 31.8 g/dL (ref 30.0–36.0)
MCV: 89.9 fl (ref 78.0–100.0)
Monocytes Absolute: 0.4 10*3/uL (ref 0.1–1.0)
Monocytes Relative: 5.8 % (ref 3.0–12.0)
Neutro Abs: 4.2 10*3/uL (ref 1.4–7.7)
Neutrophils Relative %: 62.3 % (ref 43.0–77.0)
Platelets: 297 10*3/uL (ref 150.0–400.0)
RBC: 4.79 Mil/uL (ref 3.87–5.11)
RDW: 14 % (ref 11.5–15.5)
WBC: 6.7 10*3/uL (ref 4.0–10.5)

## 2023-06-22 LAB — VITAMIN B12: Vitamin B-12: 264 pg/mL (ref 211–911)

## 2023-06-22 LAB — LIPID PANEL
Cholesterol: 106 mg/dL (ref 0–200)
HDL: 41.7 mg/dL (ref >39.00)
LDL Cholesterol: 54 mg/dL (ref 0–99)
NonHDL: 64.55
Total CHOL/HDL Ratio: 3
Triglycerides: 55 mg/dL (ref 0.0–149.0)
VLDL: 11 mg/dL (ref 0.0–40.0)

## 2023-06-22 LAB — COMPREHENSIVE METABOLIC PANEL WITH GFR
ALT: 22 U/L (ref 0–35)
AST: 19 U/L (ref 0–37)
Albumin: 3.8 g/dL (ref 3.5–5.2)
Alkaline Phosphatase: 87 U/L (ref 39–117)
BUN: 19 mg/dL (ref 6–23)
CO2: 28 meq/L (ref 19–32)
Calcium: 9.1 mg/dL (ref 8.4–10.5)
Chloride: 110 meq/L (ref 96–112)
Creatinine, Ser: 0.77 mg/dL (ref 0.40–1.20)
GFR: 82.66 mL/min (ref >60.00)
Glucose, Bld: 101 mg/dL — ABNORMAL HIGH (ref 70–99)
Potassium: 3.9 meq/L (ref 3.5–5.1)
Sodium: 143 meq/L (ref 135–145)
Total Bilirubin: 0.3 mg/dL (ref 0.2–1.2)
Total Protein: 5.9 g/dL — ABNORMAL LOW (ref 6.0–8.3)

## 2023-06-22 LAB — HEMOGLOBIN A1C: Hgb A1c MFr Bld: 5.9 % (ref 4.6–6.5)

## 2023-06-22 LAB — IBC + FERRITIN
Ferritin: 5 ng/mL — ABNORMAL LOW (ref 10.0–291.0)
Iron: 43 ug/dL (ref 42–145)
Saturation Ratios: 10.6 % — ABNORMAL LOW (ref 20.0–50.0)
TIBC: 407.4 ug/dL (ref 250.0–450.0)
Transferrin: 291 mg/dL (ref 212.0–360.0)

## 2023-06-22 LAB — VITAMIN D 25 HYDROXY (VIT D DEFICIENCY, FRACTURES): VITD: 26.62 ng/mL — ABNORMAL LOW (ref 30.00–100.00)

## 2023-06-22 LAB — MICROALBUMIN / CREATININE URINE RATIO
Creatinine,U: 56.7 mg/dL
Microalb Creat Ratio: UNDETERMINED mg/g (ref 0.0–30.0)
Microalb, Ur: <0.7 mg/dL

## 2023-06-22 LAB — MAGNESIUM: Magnesium: 2.1 mg/dL (ref 1.5–2.5)

## 2023-06-22 NOTE — Progress Notes (Signed)
 Has appt on 4/15-will discuss labs in more detail then

## 2023-06-30 ENCOUNTER — Encounter: Payer: Self-pay | Admitting: Family Medicine

## 2023-06-30 ENCOUNTER — Ambulatory Visit (INDEPENDENT_AMBULATORY_CARE_PROVIDER_SITE_OTHER): Payer: Medicaid Other | Admitting: Family Medicine

## 2023-06-30 VITALS — BP 135/68 | HR 72 | Temp 97.7°F | Ht 68.0 in | Wt 238.8 lb

## 2023-06-30 DIAGNOSIS — D500 Iron deficiency anemia secondary to blood loss (chronic): Secondary | ICD-10-CM | POA: Diagnosis not present

## 2023-06-30 DIAGNOSIS — I10 Essential (primary) hypertension: Secondary | ICD-10-CM

## 2023-06-30 DIAGNOSIS — E559 Vitamin D deficiency, unspecified: Secondary | ICD-10-CM

## 2023-06-30 DIAGNOSIS — R600 Localized edema: Secondary | ICD-10-CM

## 2023-06-30 DIAGNOSIS — Z7985 Long-term (current) use of injectable non-insulin antidiabetic drugs: Secondary | ICD-10-CM

## 2023-06-30 DIAGNOSIS — E785 Hyperlipidemia, unspecified: Secondary | ICD-10-CM

## 2023-06-30 DIAGNOSIS — E1169 Type 2 diabetes mellitus with other specified complication: Secondary | ICD-10-CM

## 2023-06-30 DIAGNOSIS — E038 Other specified hypothyroidism: Secondary | ICD-10-CM | POA: Diagnosis not present

## 2023-06-30 DIAGNOSIS — Z7984 Long term (current) use of oral hypoglycemic drugs: Secondary | ICD-10-CM | POA: Diagnosis not present

## 2023-06-30 DIAGNOSIS — E119 Type 2 diabetes mellitus without complications: Secondary | ICD-10-CM | POA: Diagnosis not present

## 2023-06-30 MED ORDER — LOSARTAN POTASSIUM 50 MG PO TABS: 50.0000 mg | ORAL_TABLET | Freq: Every day | ORAL | 3 refills | Status: AC

## 2023-06-30 MED ORDER — FUROSEMIDE 20 MG PO TABS: 20.0000 mg | ORAL_TABLET | Freq: Every day | ORAL | 3 refills | Status: AC

## 2023-06-30 NOTE — Progress Notes (Signed)
 Subjective:     Patient ID: Leah Powers, female    DOB: May 25, 1960, 63 y.o.   MRN: [id number]  Chief Complaint  Patient presents with   Diabetes   Hypertension   Hyperlipidemia   Vitamin D deficiency    HPI -   DM, type 2 - Pt taking farxiga 5 mg daily and rybelsus 7 mg daily. Switched from ozempic to rybelsus on 11/19/22 d/t SE.  Tolerating well. Checking her blood sugars a few days a week. Reports numbness in left great toe, no pain.   Constipation - Her constipation has improved since her last visit.  She is taking Linzess 290 mcg daily, protonix 40mg  daily, and bentyl 10 mg TID. Marland Kitchen No nausea or vomiting.  Doing SO much better   Cholesterol - Taking rosuvastatin 10 mg. .   Vit D deficiency - Started taking 2000 units daily   Callus on toes - Has callus on the distal tips of her toes. Has been trying to schedule an appointment with podiatry, has not been able to reach them by phone.   Anemia - off iron now.   7.  HTN-Pt is on losartan 50mg  daily   No ha/dizziness/cp/palp/edema/cough/sob and furosemide   Discussed the use of AI scribe software for clinical note transcription with the patient, who gave verbal consent to proceed.  History of Present Illness Leah Powers is a 63 year old female with diabetes and gastrointestinal issues who presents for a follow-up visit.  She has been taking vitamin D3 at a dose of 2000 IU daily for the past five months, having switched from a weekly regimen. Despite this adjustment, her vitamin D levels remain low.  She experiences seasonal allergies, particularly during high pollen periods, and takes Zyrtec 10 mg daily. She occasionally uses Benadryl at night during severe allergy episodes.  For diabetes management, she is on Farxiga 5 mg and Rybelsus 7 mg. Her blood sugar levels have stabilized, with recent readings around 95 mg/dL, and her F6O is 1.3%.  She has gastrointestinal issues and is taking Linzess for constipation. She has not  had a recent colonoscopy due to her GI specialist moving. She also takes Protonix and Bentyl, but not daily.  She is on rosuvastatin 10 mg for cholesterol management, with an LDL of 54 mg/dL.  She has calluses on her feet, which are managed with regular trimming every three months and soaking in a homemade solution.  She was previously on iron supplements twice a week but stopped. Her recent labs show a decline in iron stores, prompting a return to iron supplementation.  For hypertension, she takes losartan 50 mg and furosemide, which also helps with swelling. No headaches, dizziness, chest pain, or shortness of breath related to blood pressure.  She is managing her mood with psychiatric support and is awaiting a new therapist appointment. Her thyroid levels are stable, and she is on lithium.  She has started using a [employer] for exercise, walking on an indoor track, and plans to use the pool for additional activity.    Health Maintenance Due  Topic Date Due   COVID-19 Vaccine (9 - 2024-25 season) 11/16/2022    Past Medical History:  Diagnosis Date   Allergy    Arthritis    L hand, R ankle  (10/08/2016)   Bipolar 1 disorder (HCC)    Depression    Diabetes mellitus without complication (HCC)    Fibromyalgia    told that the breakout on the upper  back , could be over active nerves , also treated with cream & Gabapentin- [facility] [place]    History of kidney stones    spontaneous passing   Hypertension    Hypothyroid    Pneumonia ~ 1967; 07/2016   Pyelonephritis 1997   treated with IV antibiotic     Past Surgical History:  Procedure Laterality Date   BIOPSY  11/04/2022   Procedure: BIOPSY;  Surgeon: Selena Daily, MD;  Location: [facility] ENDOSCOPY;  Service: Gastroenterology;;   BLADDER INSTILLATION  X 2   CHALAZION EXCISION Bilateral    R- eye X2 , L eye- X1   COLONOSCOPY     COLONOSCOPY WITH PROPOFOL N/A 11/04/2022   Procedure: COLONOSCOPY WITH  PROPOFOL;  Surgeon: Selena Daily, MD;  Location: [facility] ENDOSCOPY;  Service: Gastroenterology;  Laterality: N/A;  Patient does not want to be the first patient.  Prefers mid-morning   COLONOSCOPY WITH PROPOFOL N/A 02/05/2023   Procedure: COLONOSCOPY WITH PROPOFOL;  Surgeon: Selena Daily, MD;  Location: [facility] ENDOSCOPY;  Service: Gastroenterology;  Laterality: N/A;   ESOPHAGOGASTRODUODENOSCOPY (EGD) WITH PROPOFOL N/A 11/04/2022   Procedure: ESOPHAGOGASTRODUODENOSCOPY (EGD) WITH PROPOFOL;  Surgeon: Selena Daily, MD;  [facility] ENDOSCOPY;  Service: Gastroenterology;  Laterality: N/A;   FOOT ARTHRODESIS Right 10/08/2016   Procedure: RIGHT TRIPLE ARTHRODESIS;  Surgeon: Timothy Ford, MD;  Location: [facility] OR;  Service: Orthopedics;  Laterality: Right;   FOOT ARTHRODESIS, TRIPLE Right 10/08/2016   ankle   FRENULECTOMY, LINGUAL  1970s   LAPAROSCOPIC CHOLECYSTECTOMY  1996   PALATE SURGERY  1970s   tissue removed from upper palate as a child    URETHRAL DILATION       Current Outpatient Medications:    albuterol (VENTOLIN HFA) 108 (90 Base) MCG/ACT inhaler, Inhale 2 puffs into the lungs every 6 (six) hours as needed for wheezing or shortness of breath., Disp: 1 each, Rfl: 2   dapagliflozin propanediol (FARXIGA) 5 MG TABS tablet, Take 1 tablet (5 mg total) by mouth daily., Disp: 90 tablet, Rfl: 3   dicyclomine (BENTYL) 10 MG capsule, Take 1 capsule (10 mg total) by mouth 3 (three) times daily before meals., Disp: 90 capsule, Rfl: 11   docusate sodium (COLACE) 100 MG capsule, Take 100 mg by mouth 3 (three) times daily., Disp: , Rfl:    Elastic Bandages & Supports (MEDICAL COMPRESSION SOCKS) MISC, Wear compression stockings daily with upright activity. Remove at bedtime., Disp: 2 each, Rfl: 0   gabapentin (NEURONTIN) 800 MG tablet, Take 1 tablet (800 mg total) by mouth 3 (three) times daily., Disp: 270 tablet, Rfl: 3   Lancets (ONETOUCH DELICA PLUS LANCET30G) MISC, Inject 1 Stick into  the skin daily., Disp: 100 each, Rfl: 2   levothyroxine (SYNTHROID) 137 MCG tablet, TAKE 1 TABLET BY MOUTH DAILY BEFORE BREAKFAST, Disp: 90 tablet, Rfl: 0   linaclotide (LINZESS) 290 MCG CAPS capsule, Take 1 capsule (290 mcg total) by mouth daily before breakfast., Disp: 90 capsule, Rfl: 3   lithium carbonate 300 MG capsule, TAKE THREE CAPSULES BY MOUTH EVERY NIGHT AT BEDTIME, Disp: 90 capsule, Rfl: 5   mupirocin ointment (BACTROBAN) 2 %, Apply topical to aa qd, Disp: 22 g, Rfl: 3   ONETOUCH VERIO test strip, 1 each by Other route daily., Disp: 100 each, Rfl: 3   pantoprazole (PROTONIX) 40 MG tablet, Take 1 tablet (40 mg total) by mouth daily., Disp: 90 tablet, Rfl: 3   rosuvastatin (CRESTOR) 10 MG tablet, TAKE  1 TABLET BY MOUTH DAILY, Disp: 90 tablet, Rfl: 3   Semaglutide (RYBELSUS) 7 MG TABS, TAKE 1 TABLET BY MOUTH DAILY, Disp: 30 tablet, Rfl: 2   Simethicone (GAS-X MAXIMUM STRENGTH PO), , Disp: , Rfl:    tacrolimus (PROTOPIC) 0.1 % ointment, Apply 1 Application topically 2 (two) times daily. Use BID for 2 weeks on the break from Triamcinolone cream., Disp: , Rfl:    triamcinolone cream (KENALOG) 0.1 %, Apply to affected area twice a day for 2 weeks then stop, Disp: 453.6 g, Rfl: 3   cetirizine (ZYRTEC) 5 MG tablet, Take 5 mg by mouth 2 (two) times daily as needed for allergies. , Disp: , Rfl:    furosemide (LASIX) 20 MG tablet, Take 1 tablet (20 mg total) by mouth daily., Disp: 90 tablet, Rfl: 3   losartan (COZAAR) 50 MG tablet, Take 1 tablet (50 mg total) by mouth daily., Disp: 90 tablet, Rfl: 3  Allergies  Allergen Reactions   Eucalyptus Oil Shortness Of Breath    Other reaction(s): flush, cant breath   Haldol [Haloperidol Decanoate] Other (See Comments)    Drooling, weaving in floor, parkinson's syndrome (couldn't eat or talk) , pt was hospitalized    Molds & Smuts Shortness Of Breath and Itching   Other Anaphylaxis, Shortness Of Breath, Itching and Swelling    Bleu cheese     Penicillins Anaphylaxis and Rash    Pt was 63 years old  Has patient had a PCN reaction causing immediate rash, facial/tongue/throat swelling, SOB or lightheadedness with hypotension: Yes Has patient had a PCN reaction causing severe rash involving mucus membranes or skin necrosis: Unknown Has patient had a PCN reaction that required hospitalization: No Has patient had a PCN reaction occurring within the last 10 years: No If all of the above answers are "NO", then may proceed with Cephalosporin use.    Bee Venom Other (See Comments)    Yellow jackets and wasps-stung by 35, MD told pt that she wouldn't have resistance if stung again    Corn Oil     Other reaction(s): diarrhea, lots   Corn-Containing Products Nausea And Vomiting and Other (See Comments)    Stomach distress    Demeclocycline Other (See Comments)   Diflunisal Other (See Comments)    Upset stomach Other reaction(s): Unknown   Haloperidol Lactate     Other reaction(s): stumble around, droul   Hydrochlorothiazide     Other reaction(s): elevated lithium level   Hydrocodone-Acetaminophen Other (See Comments)    Other reaction(s): abd pain   Hydromorphone Other (See Comments)   Imipramine     Other reaction(s): rash   Metformin     Other reaction(s): stomach upset   Metformin And Related    Nitrofurantoin     Other reaction(s): rash   Penicillin G Other (See Comments)    Other reaction(s): rash   Sulfamethoxazole-Trimethoprim     Other reaction(s): rash   Tetracycline Hcl     Other reaction(s): rash   Vicodin [Hydrocodone-Acetaminophen] Diarrhea and Other (See Comments)    Upset stomach   Cephalexin Rash    Other reaction(s): diarrhea   Doxycycline Rash    Other reaction(s): rash   Erythromycin Rash    Other reaction(s): rash   Macrodantin Rash   Motrin [Ibuprofen] Palpitations   Septra [Bactrim] Rash   Tetracyclines & Related Rash   Tofranil-Pm Rash   ROS neg/noncontributory except as noted HPI/below       Objective:     BP  135/68   Pulse 72   Temp 97.7 F (36.5 C)   Ht 5\' 8"  (1.727 m)   Wt 238 lb 12.8 oz (108.3 kg)   LMP 07/30/2013   SpO2 96%   BMI 36.31 kg/m  Wt Readings from Last 3 Encounters:  06/30/23 238 lb 12.8 oz (108.3 kg)  02/05/23 234 lb 9.6 oz (106.4 kg)  12/31/22 241 lb 3.2 oz (109.4 kg)    Physical Exam   Gen: WDWN NAD HEENT: NCAT, conjunctiva not injected, sclera nonicteric NECK:  supple, no thyromegaly, no nodes, no carotid bruits CARDIAC: RRR, S1S2+, no murmur. DP 2+B LUNGS: CTAB. No wheezes ABDOMEN:  BS+, soft, NTND, No HSM, no masses EXT:  no edema MSK: no gross abnormalities.  NEURO: A&O x3.  CN II-XII intact.  PSYCH: normal mood. Good eye contact  Discussed labs      Assessment & Plan:  Type 2 diabetes mellitus without complication, without long-term current use of insulin (HCC)  Primary hypertension -     Losartan Potassium; Take 1 tablet (50 mg total) by mouth daily.  Dispense: 90 tablet; Refill: 3  Other specified hypothyroidism  Hyperlipidemia associated with type 2 diabetes mellitus (HCC)  Local edema -     Furosemide; Take 1 tablet (20 mg total) by mouth daily.  Dispense: 90 tablet; Refill: 3  Vitamin D deficiency  Iron deficiency anemia due to chronic blood loss  Long term current use of oral hypoglycemic drug  Long-term current use of injectable noninsulin antidiabetic medication  Assessment and Plan Assessment & Plan Type 2 Diabetes Mellitus   Blood sugar levels are well-controlled with an A1c of 5.9. She is on Farxiga 5 mg and Rybelsus 7 mg with no significant side effects. Continue Farxiga 5 mg daily and Rybelsus 7 mg daily.  Hypertension   Blood pressure is well-controlled with losartan 50 mg and furosemide, which also addresses swelling. Continue losartan 50 mg daily and furosemide as prescribed.  Hyperlipidemia   Cholesterol levels are well-controlled with rosuvastatin 10 mg. LDL is at 54, which is optimal. Continue  rosuvastatin 10 mg daily.  Iron deficiency   Decline in hemoglobin and ferritin levels indicates depleted iron stores. No current anemia, but iron supplementation is needed. Discuss iron deficiency with GI specialist to rule out underlying causes such as gastrointestinal bleeding. Resume iron supplementation twice a week.  B12 deficiency   B12 levels are on the low end of normal, possibly due to dietary or absorption issues. Start B12 supplementation at 1000 mcg daily to prevent deficiency.  Vitamin D deficiency   Vitamin D levels are slightly low despite taking 2000 IU of vitamin D3 daily since October. Increase vitamin D3 intake to 3000 IU daily to address the deficiency.  Constipation   Managed with Linzess, but colonoscopy scheduling is pending due to GI provider transition. Transfer GI care to [facility] for colonoscopy scheduling. Continue Linzess as prescribed.  Peripheral neuropathy with calluses   Calluses on feet require regular care. Current regimen includes soaking feet in a solution of lotion, soap, shampoo, Vaseline, and sugar. Consider more frequent foot care if calluses persist. Continue regular foot care regimen.  Allergic rhinitis   Experiences seasonal allergies, particularly during high pollen periods. Zyrtec 10 mg is taken daily, but additional Benadryl was needed during peak allergy season. Continue Zyrtec 10 mg daily and use Benadryl as needed for severe allergy symptoms.  General Health Maintenance   Encouraged to maintain physical activity and utilize [employer] for exercise. Engage in  regular physical activity, such as walking at the [facility], and utilize pool facilities for exercise.  Follow-up   Advised to follow up in six months unless issues arise sooner. Ensure medication refills are up to date, including losartan and furosemide. Schedule follow-up appointment in six months.     Return in about 6 months (around 12/30/2023) for chronic follow-up. Ellsworth Haas, MD

## 2023-06-30 NOTE — Patient Instructions (Addendum)
 Vitamin D 3000iu/day  B12 1050mcg/day  Iron twice/wk

## 2023-07-06 ENCOUNTER — Encounter: Payer: Self-pay | Admitting: Professional Counselor

## 2023-07-06 ENCOUNTER — Ambulatory Visit: Admitting: Professional Counselor

## 2023-07-06 ENCOUNTER — Other Ambulatory Visit: Payer: Self-pay | Admitting: Physician Assistant

## 2023-07-06 DIAGNOSIS — F319 Bipolar disorder, unspecified: Secondary | ICD-10-CM | POA: Diagnosis not present
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
## 2023-07-19 ENCOUNTER — Other Ambulatory Visit: Payer: Self-pay | Admitting: Physician Assistant

## 2023-07-30 ENCOUNTER — Encounter: Payer: Self-pay | Admitting: Professional Counselor

## 2023-07-30 ENCOUNTER — Ambulatory Visit: Admitting: Professional Counselor

## 2023-07-30 DIAGNOSIS — F319 Bipolar disorder, unspecified: Secondary | ICD-10-CM
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
## 2023-08-27 ENCOUNTER — Ambulatory Visit: Payer: Medicaid Other | Admitting: Dermatology

## 2023-08-27 ENCOUNTER — Encounter: Payer: Self-pay | Admitting: Dermatology

## 2023-08-27 VITALS — BP 135/82 | HR 71

## 2023-08-27 DIAGNOSIS — L281 Prurigo nodularis: Secondary | ICD-10-CM | POA: Diagnosis not present

## 2023-08-27 DIAGNOSIS — L219 Seborrheic dermatitis, unspecified: Secondary | ICD-10-CM

## 2023-08-27 DIAGNOSIS — R202 Paresthesia of skin: Secondary | ICD-10-CM | POA: Insufficient documentation

## 2023-08-27 DIAGNOSIS — L280 Lichen simplex chronicus: Secondary | ICD-10-CM | POA: Diagnosis not present

## 2023-08-27 DIAGNOSIS — E2839 Other primary ovarian failure: Secondary | ICD-10-CM | POA: Insufficient documentation

## 2023-08-27 DIAGNOSIS — E66812 Obesity, class 2: Secondary | ICD-10-CM | POA: Insufficient documentation

## 2023-08-27 DIAGNOSIS — I10 Essential (primary) hypertension: Secondary | ICD-10-CM | POA: Insufficient documentation

## 2023-08-27 MED ORDER — TRIAMCINOLONE ACETONIDE 10 MG/ML IJ SUSP
10.0000 mg | Freq: Once | INTRAMUSCULAR | Status: AC
  Administered 2023-08-27: 10 mg

## 2023-08-27 MED ORDER — KETOCONAZOLE 2 % EX CREA: 1.0000 | TOPICAL_CREAM | Freq: Two times a day (BID) | CUTANEOUS | 6 refills | Status: AC

## 2023-08-27 MED ORDER — SAFETY SEAL MISCELLANEOUS MISC: 1.0000 | Freq: Three times a day (TID) | 8 refills | Status: DC

## 2023-08-27 NOTE — Progress Notes (Signed)
 Follow-Up Visit   Subjective  Leah Powers is a 63 y.o. female who presents for the following: Neurodermatitis   Patient present today for follow up visit for Neurodermatitis. Patient was last evaluated on 02/26/23. At this visit patient was advised to continue Gabapentin  orally and alternating TMC 0.1% with Tacrolimus  2 weeks on and 2 weeks off. She was also recommended to apply CeraVe Anti-itch lotion daily. Patient reports sxs are rebounding. Her sxs started about 3-4 weeks ago. She is currently is applying TMC 2 times daily. Today patient rates her itch 7 out of 10. Patient denies medication changes.  The following portions of the chart were reviewed this encounter and updated as appropriate: medications, allergies, medical history  Review of Systems:  No other skin or systemic complaints except as noted in HPI or Assessment and Plan.  Objective  Well appearing patient in no apparent distress; mood and affect are within normal limits.  A full examination was performed including scalp, head, eyes, ears, nose, lips, neck, chest, axillae, abdomen, back, buttocks, bilateral upper extremities, bilateral lower extremities, hands, feet, fingers, toes, fingernails, and toenails. All findings within normal limits unless otherwise noted below.   Relevant exam findings are noted in the Assessment and Plan.         Assessment & Plan    1. Neurodermatitis - Assessment:  Patient reports a flare of neurodermatitis for the past three weeks. Previously well-controlled with gabapentin , triamcinolone , and saccharomyces. Patient mentions a stiff neck and recent change in pillows. Examination reveals one active excoriation and hypopigmentation from prior excoriation.  - Plan:    Continue current medication:     - Gabapentin  800 mg PO TID    Start new topical treatment:     - NeuroRelief cream (containing gabapentin , lidocaine , and amitriptyline) topically BID-TID     - Apply thin layer, can mix  with anti-itch lotion (e.g., CeraVe Anti-Itch or Cerna) for easier application    Follow up in November when weather changes  2. 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 Seborrheic Dermatitis - Assessment: Patient has seborrheic dermatitis affecting the face and forehead.  - Plan:    Prescribe topical treatments:     - Ketoconazole cream mixed with hydrocortisone  for up to 10 days     - Luliconazole cream for face and forehead    Provide samples of zinc-based shampoo  Follow-up in November for reassessment of neurodermatitis, or sooner if needed. NEURODERMATITIS   Related Medications mupirocin  ointment (BACTROBAN ) 2 % Apply topical to aa qd gabapentin  (NEURONTIN ) 800 MG tablet Take 1 tablet (800 mg total) by mouth 3 (three) times daily. Safety Seal Miscellaneous MISC Apply 1 Application topically 3 (three) times daily. Medication Name: Neuro Relief (Gabapentin  4%, Ketoprofen 2%, Lidocaine  2%) PRURIGO NODULARIS Left Upper Back, Right Shoulder - Posterior Procedure Note Intralesional Injection  Location: Left Posterior shoulder and Right Anterior Shoulder  Informed Consent: Discussed risks (infection, pain, bleeding, bruising, thinning of the skin, loss of skin pigment, lack of resolution, and recurrence of lesion) and benefits of the procedure, as well as the alternatives. Informed consent was obtained. Preparation: The area was prepared a standard fashion.  Anesthesia:none  Procedure Details: An intralesional injection was performed with Kenalog  10 mg/cc. 0.05 cc in total were injected. NDC #: 1610-9604-54 Exp: 05/2024 LOT: [id number]  Total  number of injections: 2  Plan: The patient was instructed on post-op care. Recommend OTC analgesia as needed for pain.  Related Medications triamcinolone  acetonide  (KENALOG ) 10 MG/ML injection 10 mg  SEBORRHEIC DERMATITIS   Related Medications ketoconazole (NIZORAL) 2 % cream Apply 1 Application topically 2 (two) times daily. Mix with over the counter Hydrocortisone  1%  Return in 5 months (on 01/27/2024) for Neurodermatitis F/U.  I, Jetta Ager, am acting as Neurosurgeon for [employer], DO.  Documentation: I have reviewed the above documentation for accuracy and completeness, and I agree with the above.  Louana Roup, DO

## 2023-08-27 NOTE — Patient Instructions (Addendum)
 Date: Thu Aug 27 2023  Hello Leah Powers,  Thank you for visiting today. Here is a summary of the key instructions:  - Medications:   - Continue gabapentin , 800 mg, three times a day   - Use new NeuroRelief cream 2-3 times a day     - Apply a thin layer     - Can mix with anti-itch lotion like CeraVe Anti-Itch or Cerna to spread more   - Use ketoconazole cream mixed with hydrocortisone  for up to 10 days (for seborrheic dermatitis)   - Use ketoconazole cream for seborrheic dermatitis on face and forehead  - Treatments:   - Kenalog  injection (0.05) for prurigo nodule  - Lifestyle Changes:   - Have someone help apply Neuro Relief cream (like your husband)  - Follow-up:   - Return for follow-up appointment in November when the weather changes   - Call the office if you have any issues with prescriptions  - Other Instructions:   - Use zinc-based shampoo samples provided   - Get EpiPen from your regular doctor  Please reach out if you have any questions or concerns.  Best Regards,  Dr. Louana Roup [facility]        Important Information   Due to recent changes in healthcare laws, you may see results of your pathology and/or laboratory studies on MyChart before the doctors have had a chance to review them. We understand that in some cases there may be results that are confusing or concerning to you. Please understand that not all results are received at the same time and often the doctors may need to interpret multiple results in order to provide you with the best plan of care or course of treatment. Therefore, we ask that you please give us  2 business days to thoroughly review all your results before contacting the office for clarification. Should we see a critical lab result, you will be contacted sooner.     If You Need Anything After Your Visit   If you have any questions or concerns for your doctor, please call our main line at [phone]. If no one answers, please leave  a voicemail as directed and we will return your call as soon as possible. Messages left after 4 pm will be answered the following business day.    You may also send us  a message via MyChart. We typically respond to MyChart messages within 1-2 business days.  For prescription refills, please ask your pharmacy to contact our office. Our fax number is [phone].  If you have an urgent issue when the clinic is closed that cannot wait until the next business day, you can page your doctor at the number below.     Please note that while we do our best to be available for urgent issues outside of office hours, we are not available 24/7.    If you have an urgent issue and are unable to reach us , you may choose to seek medical care at your doctor's office, retail clinic, urgent care center, or emergency room.   If you have a medical emergency, please immediately call 911 or go to the emergency department. In the event of inclement weather, please call our main line at [phone] for an update on the status of any delays or closures.  Dermatology Medication Tips: Please keep the boxes that topical medications come in in order to help keep track of the instructions about where and how to use these. Pharmacies typically print the medication instructions  only on the boxes and not directly on the medication tubes.   If your medication is too expensive, please contact our office at [phone] or send us  a message through MyChart.    We are unable to tell what your co-pay for medications will be in advance as this is different depending on your insurance coverage. However, we may be able to find a substitute medication at lower cost or fill out paperwork to get insurance to cover a needed medication.    If a prior authorization is required to get your medication covered by your insurance company, please allow us  1-2 business days to complete this process.   Drug prices often vary depending on where the  prescription is filled and some pharmacies may offer cheaper prices.   The website www.goodrx.com contains coupons for medications through different pharmacies. The prices here do not account for what the cost may be with help from insurance (it may be cheaper with your insurance), but the website can give you the price if you did not use any insurance.  - You can print the associated coupon and take it with your prescription to the pharmacy.  - You may also stop by our office during regular business hours and pick up a GoodRx coupon card.  - If you need your prescription sent electronically to a different pharmacy, notify our office through [facility] or by phone at [phone]

## 2023-09-03 ENCOUNTER — Ambulatory Visit: Admitting: Professional Counselor

## 2023-09-14 ENCOUNTER — Encounter: Payer: Self-pay | Admitting: Podiatry

## 2023-09-14 ENCOUNTER — Ambulatory Visit (INDEPENDENT_AMBULATORY_CARE_PROVIDER_SITE_OTHER): Admitting: Podiatry

## 2023-09-14 DIAGNOSIS — E1140 Type 2 diabetes mellitus with diabetic neuropathy, unspecified: Secondary | ICD-10-CM

## 2023-09-14 DIAGNOSIS — E1149 Type 2 diabetes mellitus with other diabetic neurological complication: Secondary | ICD-10-CM

## 2023-09-14 DIAGNOSIS — B351 Tinea unguium: Secondary | ICD-10-CM | POA: Diagnosis not present

## 2023-09-14 DIAGNOSIS — M79676 Pain in unspecified toe(s): Secondary | ICD-10-CM

## 2023-09-14 NOTE — Progress Notes (Signed)
This patient returns to my office for at risk foot care.  This patient requires this care by a professional since this patient will be at risk due to having type 2 diabetes. This patient is unable to cut nails herself since the patient cannot reach her nails.These nails are painful walking and wearing shoes.  This patient presents for at risk foot care today.  General Appearance  Alert, conversant and in no acute stress.  Vascular  Dorsalis pedis and posterior tibial  pulses are palpable  bilaterally.  Capillary return is within normal limits  bilaterally. Temperature is within normal limits  bilaterally.  Neurologic  Senn-Weinstein monofilament wire test within normal limits  bilaterally. Muscle power within normal limits bilaterally.  Nails Thick disfigured discolored nails with subungual debris  from hallux to fifth toes bilaterally. No evidence of bacterial infection or drainage bilaterally.  Orthopedic  No limitations of motion  feet .  No crepitus or effusions noted.  No bony pathology or digital deformities noted.  Skin  normotropic skin with no porokeratosis noted bilaterally.  No signs of infections or ulcers noted.     Onychomycosis  Pain in right toes  Pain in left toes  Consent was obtained for treatment procedures.   Mechanical debridement of nails 1-5  bilaterally performed with a nail nipper.  Filed with dremel without incident.    Return office visit    3 months                  Told patient to return for periodic foot care and evaluation due to potential at risk complications.   Shaunta Oncale DPM   

## 2023-09-15 ENCOUNTER — Other Ambulatory Visit: Payer: Self-pay | Admitting: Family Medicine

## 2023-09-15 DIAGNOSIS — E119 Type 2 diabetes mellitus without complications: Secondary | ICD-10-CM

## 2023-09-23 ENCOUNTER — Encounter: Payer: Self-pay | Admitting: Psychiatry

## 2023-09-23 ENCOUNTER — Ambulatory Visit (INDEPENDENT_AMBULATORY_CARE_PROVIDER_SITE_OTHER): Admitting: Psychiatry

## 2023-09-23 DIAGNOSIS — F319 Bipolar disorder, unspecified: Secondary | ICD-10-CM
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
## 2023-09-25 DIAGNOSIS — R112 Nausea with vomiting, unspecified: Secondary | ICD-10-CM | POA: Diagnosis not present

## 2023-09-25 DIAGNOSIS — Z860101 Personal history of adenomatous and serrated colon polyps: Secondary | ICD-10-CM | POA: Diagnosis not present

## 2023-09-25 DIAGNOSIS — K5904 Chronic idiopathic constipation: Secondary | ICD-10-CM | POA: Diagnosis not present

## 2023-10-06 ENCOUNTER — Ambulatory Visit: Admitting: Professional Counselor

## 2023-10-14 ENCOUNTER — Encounter: Payer: Self-pay | Admitting: Psychiatry

## 2023-10-14 ENCOUNTER — Ambulatory Visit: Admitting: Psychiatry

## 2023-10-14 ENCOUNTER — Ambulatory Visit (INDEPENDENT_AMBULATORY_CARE_PROVIDER_SITE_OTHER): Admitting: Psychiatry

## 2023-10-14 DIAGNOSIS — F319 Bipolar disorder, unspecified: Secondary | ICD-10-CM
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
## 2023-10-16 ENCOUNTER — Other Ambulatory Visit: Payer: Self-pay | Admitting: Physician Assistant

## 2023-10-22 ENCOUNTER — Telehealth: Payer: Self-pay

## 2023-10-22 ENCOUNTER — Other Ambulatory Visit (HOSPITAL_COMMUNITY): Payer: Self-pay

## 2023-10-22 NOTE — Telephone Encounter (Signed)
 Noted

## 2023-10-22 NOTE — Telephone Encounter (Signed)
 Pharmacy Patient Advocate Encounter   Received notification from CoverMyMeds that prior authorization for Rybelsus  7 tabs is required/requested.   Insurance verification completed.   The patient is insured through [facility] .   Per test claim: Refill too soon. PA is not needed at this time. Medication was filled 10/13/23. Next eligible fill date is 11/05/23.

## 2023-10-24 ENCOUNTER — Other Ambulatory Visit: Payer: Self-pay | Admitting: Physician Assistant

## 2023-10-26 ENCOUNTER — Ambulatory Visit (INDEPENDENT_AMBULATORY_CARE_PROVIDER_SITE_OTHER): Admitting: Family Medicine

## 2023-10-26 ENCOUNTER — Encounter: Payer: Self-pay | Admitting: Family Medicine

## 2023-10-26 VITALS — BP 132/72 | HR 70 | Temp 97.7°F | Resp 18 | Ht 68.0 in | Wt 234.5 lb

## 2023-10-26 DIAGNOSIS — M25552 Pain in left hip: Secondary | ICD-10-CM | POA: Diagnosis not present

## 2023-10-26 DIAGNOSIS — M25551 Pain in right hip: Secondary | ICD-10-CM

## 2023-10-26 DIAGNOSIS — R252 Cramp and spasm: Secondary | ICD-10-CM

## 2023-10-26 DIAGNOSIS — R0781 Pleurodynia: Secondary | ICD-10-CM | POA: Diagnosis not present

## 2023-10-26 LAB — CBC WITH DIFFERENTIAL/PLATELET
Basophils Absolute: 0.1 K/uL (ref 0.0–0.1)
Basophils Relative: 1 % (ref 0.0–3.0)
Eosinophils Absolute: 0.4 K/uL (ref 0.0–0.7)
Eosinophils Relative: 5.7 % — ABNORMAL HIGH (ref 0.0–5.0)
HCT: 48.8 % — ABNORMAL HIGH (ref 36.0–46.0)
Hemoglobin: 15.7 g/dL — ABNORMAL HIGH (ref 12.0–15.0)
Lymphocytes Relative: 24.7 % (ref 12.0–46.0)
Lymphs Abs: 1.6 K/uL (ref 0.7–4.0)
MCHC: 32.1 g/dL (ref 30.0–36.0)
MCV: 90.4 fl (ref 78.0–100.0)
Monocytes Absolute: 0.3 K/uL (ref 0.1–1.0)
Monocytes Relative: 5 % (ref 3.0–12.0)
Neutro Abs: 4.2 K/uL (ref 1.4–7.7)
Neutrophils Relative %: 63.6 % (ref 43.0–77.0)
Platelets: 259 K/uL (ref 150.0–400.0)
RBC: 5.39 Mil/uL — ABNORMAL HIGH (ref 3.87–5.11)
RDW: 14.9 % (ref 11.5–15.5)
WBC: 6.7 K/uL (ref 4.0–10.5)

## 2023-10-26 LAB — COMPREHENSIVE METABOLIC PANEL WITH GFR
ALT: 24 U/L (ref 0–35)
AST: 21 U/L (ref 0–37)
Albumin: 4.2 g/dL (ref 3.5–5.2)
Alkaline Phosphatase: 87 U/L (ref 39–117)
BUN: 15 mg/dL (ref 6–23)
CO2: 25 meq/L (ref 19–32)
Calcium: 9.8 mg/dL (ref 8.4–10.5)
Chloride: 107 meq/L (ref 96–112)
Creatinine, Ser: 0.74 mg/dL (ref 0.40–1.20)
GFR: 86.49 mL/min (ref >60.00)
Glucose, Bld: 138 mg/dL — ABNORMAL HIGH (ref 70–99)
Potassium: 4.1 meq/L (ref 3.5–5.1)
Sodium: 140 meq/L (ref 135–145)
Total Bilirubin: 0.4 mg/dL (ref 0.2–1.2)
Total Protein: 6.4 g/dL (ref 6.0–8.3)

## 2023-10-26 LAB — C-REACTIVE PROTEIN: CRP: <1 mg/dL (ref 0.5–20.0)

## 2023-10-26 LAB — MAGNESIUM: Magnesium: 2.3 mg/dL (ref 1.5–2.5)

## 2023-10-26 LAB — CK: Total CK: 63 U/L (ref 17–177)

## 2023-10-26 LAB — SEDIMENTATION RATE: Sed Rate: 5 mm/h (ref 0–30)

## 2023-10-26 NOTE — Progress Notes (Signed)
 Subjective:     Patient ID: Leah Powers, female    DOB: 12/19/1960, 63 y.o.   MRN: [id number]  Chief Complaint  Patient presents with   Pain   Hypertension    Increased abdominal and lower back pain radiating to ribs Colonoscopy is on the 18th     HPI Discussed the use of AI scribe software for clinical note transcription with the patient, who gave verbal consent to proceed.  History of Present Illness Leah Powers is a 63 year old female with diabetes who presents with joint pain and skin breakouts.  She experiences joint pain primarily in her right hip and lower ribs, described as aching and worsening upon standing. The pain is severe enough to have considered visiting urgent care but didn't. There have been no falls, injuries, or changes in her mattress or physical activity. Ice packs have been used for relief, as suggested by her husband. Things have improved some.  No radiation down leg, but some pain in R>L buttocks.  Has been doing some HEP from previous back pains  She uses a foam cream containing gabapentin  for joint pain, but not on a daily basis. Breakouts have been noticed on her skin in areas and then, that is where the cream is applied, suspected to be related to her neurodermatitis. The cream has alleviated pain in her elbow and other areas.(From Derm)  She has a history of neurodermatitis, described as 'tiger stripes' on her skin, with flares more common in spring and summer. Her diabetes affects skin healing, with minor injuries like steam burns taking longer to heal.  She takes rosuvastatin  for cholesterol management, furosemide  daily, and ferrous sulfate twice a week. She is aware of low B12 levels. B12 supplements are not being taken.  A recent episode of severe pain required her to lie on the floor with ice packs and sip water, occurring on a Saturday morning, causing concern about attending an event later that day. No joint swelling is noted, but there is  discomfort in her left ankle.  She has attended physical therapy for issues related to walking up steps and performs exercises to maintain mobility. Mobility is limited due to pain, leading to a temporary suspension of pool and walking activities.    Health Maintenance Due  Topic Date Due   COVID-19 Vaccine (9 - 2024-25 season) 11/16/2022   INFLUENZA VACCINE  10/16/2023    Past Medical History:  Diagnosis Date   Allergy    Arthritis    L hand, R ankle  (10/08/2016)   Bipolar 1 disorder (HCC)    Depression    Diabetes mellitus without complication (HCC)    Fibromyalgia    told that the breakout on the upper back , could be over active nerves , also treated with cream & Gabapentin - [facility] [place]    History of kidney stones    spontaneous passing   Hypertension    Hypothyroid    Pneumonia ~ 1967; 07/2016   Pyelonephritis 1997   treated with IV antibiotic     Past Surgical History:  Procedure Laterality Date   BIOPSY  11/04/2022   Procedure: BIOPSY;  Surgeon: Unk Corinn Skiff, MD;  Location: [facility] ENDOSCOPY;  Service: Gastroenterology;;   BLADDER INSTILLATION  X 2   CHALAZION EXCISION Bilateral    R- eye X2 , L eye- X1   COLONOSCOPY     COLONOSCOPY WITH PROPOFOL  N/A 11/04/2022   Procedure: COLONOSCOPY WITH PROPOFOL ;  Surgeon:  Unk Corinn Skiff, MD;  Location: [facility] ENDOSCOPY;  Service: Gastroenterology;  Laterality: N/A;  Patient does not want to be the first patient.  Prefers mid-morning   COLONOSCOPY WITH PROPOFOL  N/A 02/05/2023   Procedure: COLONOSCOPY WITH PROPOFOL ;  Surgeon: Unk Corinn Skiff, MD;  Location: [facility] ENDOSCOPY;  Service: Gastroenterology;  Laterality: N/A;   ESOPHAGOGASTRODUODENOSCOPY (EGD) WITH PROPOFOL  N/A 11/04/2022   Procedure: ESOPHAGOGASTRODUODENOSCOPY (EGD) WITH PROPOFOL ;  Surgeon: Unk Corinn Skiff, MD;  Location: [facility] ENDOSCOPY;  Service: Gastroenterology;  Laterality: N/A;   FOOT ARTHRODESIS Right 10/08/2016   Procedure: RIGHT  TRIPLE ARTHRODESIS;  Surgeon: Harden Jerona GAILS, MD;  Location: [facility] OR;  Service: Orthopedics;  Laterality: Right;   FOOT ARTHRODESIS, TRIPLE Right 10/08/2016   ankle   FRENULECTOMY, LINGUAL  1970s   LAPAROSCOPIC CHOLECYSTECTOMY  1996   PALATE SURGERY  1970s   tissue removed from upper palate as a child    URETHRAL DILATION       Current Outpatient Medications:    albuterol  (VENTOLIN  HFA) 108 (90 Base) MCG/ACT inhaler, Inhale 2 puffs into the lungs every 6 (six) hours as needed for wheezing or shortness of breath., Disp: 1 each, Rfl: 2   cetirizine (ZYRTEC) 5 MG tablet, 1 tablet Orally Once a day, Disp: , Rfl:    Cholecalciferol 75 MCG (3000 UT) TABS, 1 tablet Orally Once a day, Disp: , Rfl:    dapagliflozin  propanediol (FARXIGA ) 5 MG TABS tablet, Take 1 tablet (5 mg total) by mouth daily., Disp: 90 tablet, Rfl: 3   dicyclomine  (BENTYL ) 10 MG capsule, Take 1 capsule (10 mg total) by mouth 3 (three) times daily before meals., Disp: 90 capsule, Rfl: 11   docusate sodium  (COLACE) 100 MG capsule, Take 100 mg by mouth 3 (three) times daily., Disp: , Rfl:    Elastic Bandages & Supports (MEDICAL COMPRESSION SOCKS) MISC, Wear compression stockings daily with upright activity. Remove at bedtime., Disp: 2 each, Rfl: 0   ferrous sulfate 325 (65 FE) MG EC tablet, Take 325 mg by mouth., Disp: , Rfl:    furosemide  (LASIX ) 20 MG tablet, Take 1 tablet (20 mg total) by mouth daily., Disp: 90 tablet, Rfl: 3   gabapentin  (NEURONTIN ) 800 MG tablet, Take 1 tablet (800 mg total) by mouth 3 (three) times daily., Disp: 270 tablet, Rfl: 3   ketoconazole  (NIZORAL ) 2 % cream, Apply 1 Application topically 2 (two) times daily. Mix with over the counter Hydrocortisone  1%, Disp: 30 g, Rfl: 6   Lancets (ONETOUCH DELICA PLUS LANCET30G) MISC, Inject 1 Stick into the skin daily., Disp: 100 each, Rfl: 2   levothyroxine  (SYNTHROID ) 137 MCG tablet, TAKE 1 TABLET BY MOUTH DAILY BEFORE BREAKFAST, Disp: 90 tablet, Rfl: 0   linaclotide   (LINZESS ) 290 MCG CAPS capsule, Take 1 capsule (290 mcg total) by mouth daily before breakfast., Disp: 90 capsule, Rfl: 3   lithium  carbonate 300 MG capsule, TAKE 3 CAPSULES BY MOUTH EVERY NIGHT AT BEDTIME, Disp: 90 capsule, Rfl: 1   losartan  (COZAAR ) 50 MG tablet, Take 1 tablet (50 mg total) by mouth daily., Disp: 90 tablet, Rfl: 3   mupirocin  ointment (BACTROBAN ) 2 %, Apply topical to aa qd, Disp: 22 g, Rfl: 3   ONETOUCH VERIO test strip, 1 each by Other route daily., Disp: 100 each, Rfl: 3   pantoprazole  (PROTONIX ) 40 MG tablet, Take 1 tablet (40 mg total) by mouth daily., Disp: 90 tablet, Rfl: 3   rosuvastatin  (CRESTOR ) 10 MG tablet, TAKE 1 TABLET BY MOUTH DAILY, Disp: 90  tablet, Rfl: 3   Safety Seal Miscellaneous MISC, Apply 1 Application topically 3 (three) times daily. Medication Name: Neuro Relief (Gabapentin  4%, Ketoprofen 2%, Lidocaine  2%), Disp: 30 g, Rfl: 8   Semaglutide  (RYBELSUS ) 7 MG TABS, TAKE 1 TABLET BY MOUTH DAILY, Disp: 90 tablet, Rfl: 1   simethicone (MYLICON) 80 MG chewable tablet, 1 tablet Orally as needed, Disp: , Rfl:   Allergies  Allergen Reactions   Eucalyptus Oil Shortness Of Breath    Other reaction(s): flush, cant breath   Haldol [Haloperidol Decanoate] Other (See Comments)    Drooling, weaving in floor, parkinson's syndrome (couldn't eat or talk) , pt was hospitalized    Molds & Smuts Shortness Of Breath and Itching   Other Anaphylaxis, Shortness Of Breath, Itching and Swelling    Bleu cheese    Penicillins Anaphylaxis and Rash    Pt was 63 years old  Has patient had a PCN reaction causing immediate rash, facial/tongue/throat swelling, SOB or lightheadedness with hypotension: Yes Has patient had a PCN reaction causing severe rash involving mucus membranes or skin necrosis: Unknown Has patient had a PCN reaction that required hospitalization: No Has patient had a PCN reaction occurring within the last 10 years: No If all of the above answers are NO, then may  proceed with Cephalosporin use.    Bee Venom Other (See Comments)    Yellow jackets and wasps-stung by 35, MD told pt that she wouldn't have resistance if stung again    Corn Oil     Other reaction(s): diarrhea, lots   Corn-Containing Products Nausea And Vomiting and Other (See Comments)    Stomach distress    Demeclocycline Other (See Comments)   Diflunisal Other (See Comments)    Upset stomach Other reaction(s): Unknown   Haloperidol Lactate     Other reaction(s): stumble around, droul   Hydrochlorothiazide      Other reaction(s): elevated lithium  level   Hydrocodone-Acetaminophen  Other (See Comments)    Other reaction(s): abd pain   Hydromorphone  Other (See Comments)   Imipramine     Other reaction(s): rash   Metformin     Other reaction(s): stomach upset   Metformin And Related    Nitrofurantoin     Other reaction(s): rash   Penicillin G Other (See Comments)    Other reaction(s): rash   Sulfamethoxazole-Trimethoprim     Other reaction(s): rash   Tetracycline Hcl     Other reaction(s): rash   Vicodin [Hydrocodone-Acetaminophen ] Diarrhea and Other (See Comments)    Upset stomach   Yellow Jacket Venom     Other Reaction(s): Abdominal Swelling   Cephalexin  Rash    Other reaction(s): diarrhea   Doxycycline Rash    Other reaction(s): rash   Erythromycin Rash    Other reaction(s): rash   Macrodantin Rash   Motrin [Ibuprofen] Palpitations   Septra [Bactrim] Rash   Tetracyclines & Related Rash   Tofranil-Pm Rash   ROS neg/noncontributory except as noted HPI/below      Objective:     BP 132/72   Pulse 70   Temp 97.7 F (36.5 C) (Temporal)   Resp 18   Ht 5' 8 (1.727 m)   Wt 234 lb 8 oz (106.4 kg)   LMP 07/30/2013   SpO2 96%   BMI 35.66 kg/m  Wt Readings from Last 3 Encounters:  10/26/23 234 lb 8 oz (106.4 kg)  06/30/23 238 lb 12.8 oz (108.3 kg)  02/05/23 234 lb 9.6 oz (106.4 kg)    Physical Exam  Gen: WDWN NAD HEENT: NCAT, conjunctiva not injected,  sclera nonicteric ABDOMEN:  BS+, soft, NTND, No HSM, no masses No TTP ribs but couldn't have done that few days ago EXT:  no edema MSK: no gross abnormalities. When lift L leg, feels pulling LLQ  Back:  can stand on heels/toes/1 leg.  No TTP spine. +mild B SI tenderness. No rash.  MS 5/5 BLE.  SLR neg B.  Good ROM.  No pain w/motion of hip x flexing L.   NEURO: A&O x3.  CN II-XII intact.  PSYCH: normal mood. Good eye contact     Assessment & Plan:  Muscle cramp -     Magnesium  -     Comprehensive metabolic panel with GFR -     CBC with Differential/Platelet -     Sedimentation rate -     C-reactive protein -     CK -     Rheumatoid factor  Bilateral hip pain  Rib pain  Assessment and Plan Assessment & Plan Left hip and low back pain with limited mobility   Chronic pain in the left hip and low back with limited mobility, radiating to the buttock and back. Differential diagnosis includes musculoskeletal strain, sciatica, or SI joint dysfunction. Pain worsens with certain movements and naproxen  provides no significant relief. Physical exam shows tightness and tenderness in the left hip and lower back. Rosuvastatin  may contribute to symptoms. Refer to sports medicine for further evaluation and management. Hold rosuvastatin  for two weeks to assess its impact. Order blood work to check for acute issues. Continue naproxen  as needed for pain. Rib pain resolved-?if from muscle spasms, electrolyte def, other-check labs.  Declined PT as doing some HEP  Neurodermatitis   Chronic neurodermatitis with periodic exacerbations, presenting as breakouts in areas of previous pain. Symptoms may relate to topical compound cream use and are more pronounced in spring and summer. Seeing Derm   Vitamin B12 deficiency   Vitamin B12 deficiency noted. Start vitamin B12 supplementation.  Hyperlipidemia   Hyperlipidemia managed with rosuvastatin . Currently holding rosuvastatin  for two weeks to assess its  contribution to musculoskeletal symptoms.  Iron deficiency   Iron deficiency managed with ferrous sulfate.    Return if symptoms worsen or fail to improve.  Jenkins CHRISTELLA Carrel, MD

## 2023-10-26 NOTE — Patient Instructions (Addendum)
 Take B12 daily  [facility]  [address] on the 1st floor Phone number [phone]   Hold the rosuvastatin  for 2 weeks and see if better and then get back on it  naproxen 

## 2023-10-27 ENCOUNTER — Ambulatory Visit: Payer: Self-pay | Admitting: Family Medicine

## 2023-10-27 LAB — RHEUMATOID FACTOR: Rheumatoid fact SerPl-aCnc: <10 [IU]/mL (ref <14)

## 2023-10-27 NOTE — Progress Notes (Signed)
 Labs ok.  Need to monitor hgb but no worries now

## 2023-11-02 DIAGNOSIS — R112 Nausea with vomiting, unspecified: Secondary | ICD-10-CM | POA: Diagnosis not present

## 2023-11-02 DIAGNOSIS — K259 Gastric ulcer, unspecified as acute or chronic, without hemorrhage or perforation: Secondary | ICD-10-CM | POA: Diagnosis not present

## 2023-11-02 DIAGNOSIS — K5730 Diverticulosis of large intestine without perforation or abscess without bleeding: Secondary | ICD-10-CM | POA: Diagnosis not present

## 2023-11-02 DIAGNOSIS — K648 Other hemorrhoids: Secondary | ICD-10-CM | POA: Diagnosis not present

## 2023-11-02 DIAGNOSIS — K2930 Chronic superficial gastritis without bleeding: Secondary | ICD-10-CM | POA: Diagnosis not present

## 2023-11-02 DIAGNOSIS — Z09 Encounter for follow-up examination after completed treatment for conditions other than malignant neoplasm: Secondary | ICD-10-CM | POA: Diagnosis not present

## 2023-11-02 DIAGNOSIS — K2970 Gastritis, unspecified, without bleeding: Secondary | ICD-10-CM | POA: Diagnosis not present

## 2023-11-02 DIAGNOSIS — Z860101 Personal history of adenomatous and serrated colon polyps: Secondary | ICD-10-CM | POA: Diagnosis not present

## 2023-11-02 LAB — HM COLONOSCOPY

## 2023-11-03 ENCOUNTER — Ambulatory Visit: Admitting: Professional Counselor

## 2023-11-04 ENCOUNTER — Ambulatory Visit: Admitting: Family Medicine

## 2023-11-11 ENCOUNTER — Encounter: Payer: Self-pay | Admitting: Psychiatry

## 2023-11-11 ENCOUNTER — Ambulatory Visit (INDEPENDENT_AMBULATORY_CARE_PROVIDER_SITE_OTHER): Admitting: Psychiatry

## 2023-11-11 DIAGNOSIS — F319 Bipolar disorder, unspecified: Secondary | ICD-10-CM

## 2023-11-11 NOTE — Progress Notes (Unsigned)
 [facility] Counselor/Therapist Progress Note  Patient ID: Leah Powers, MRN: [id number],    Date: 11/11/2023  Time Spent: 5 minutes start time 10:05 AM end time 11 AM  Treatment Type: Individual Therapy  Reported Symptoms: anxiety, triggered responses, sadness, health issues, rumination, panic,flashbacks  Mental Status Exam:  Appearance:   Well Groomed     Behavior:  Appropriate  Motor:  Normal  Speech/Language:   Normal Rate  Affect:  Appropriate  Mood:  anxious  Thought process:  normal  Thought content:    WNL  Sensory/Perceptual disturbances:    WNL  Orientation:  oriented to person, place, time/date, and situation  Attention:  Good  Concentration:  Good  Memory:  WNL  Fund of knowledge:   Good  Insight:    Good  Judgment:   Good  Impulse Control:  Good   Risk Assessment: Danger to Self:  No Self-injurious Behavior: No Danger to Others: No Duty to Warn:no Physical Aggression / Violence:No  Access to Firearms a concern: No  Gang Involvement:No   Subjective: Patient was present for session. She shared that there were lots of bumps with getting her procedure because of her insurance.  Fortunately she was finally able to have the procedure and find out that her issues are getting better. She went on to share things with her husband's hearing was resolved. There was an issue with her dryer vent that was frustrating but they were able to figure it out. She shared she had an incident when there was a loud noise and she did not jump which was progress. Had her think about issues processed in last session. She reported that it does not disturb her any longer. Had her think through what was the next issue for her to process.  Patient went on to share there a couple of big issues for her currently 1 being feelings of abandonment and the other issue is fearfulness when it comes to medical providers and medications.  Patient was able to identify 63 years old as being the first  time that she had a negative experience.  Patient explained she remembered going there in her chest being very heavy and her grandmother asked her to walk around the small room because she felt that would be helpful for her.  She went on to share when the doctor came in he was very agitated with her for moving around and told her to get on the table and be still.  Patient stated she remembered feeling she did not know what to do because her grandmother had told her one thing and the doctor told her another.  Patient stated the doctor never apologized for that and she went on feeling that it was her fault.  Discussed the importance of letting that 63-year-old part of her know that it was not her fault.  Patient explained at that time she actually had pneumonia and then she was put on penicillin to help it which caused her to have hives.  Patient shared from there she has had 14 different medications where she has had a reaction and some to the extreme where she ends up being hospitalized.  The last episode was in 2022.  Patient explained that those different memories sometimes ruminate in her head.  Patient was encouraged to use her grounding skills that were discussed in previous sessions as well as DBT skill T IPP to help her when she starts feeling that anxiety and overwhelmed feeling.  Interventions:  Dialectical Behavioral Therapy and Humanistic/Existential  Diagnosis:   ICD-10-CM   1. Bipolar I disorder (HCC)  F31.9       Plan: Patient is to practice grounding skills and breathing exercises addressed in treatment.  Patient is to practice DBT skill T IPP and self brain spotting to help calm herself.  Patient is to develop treatment plan and goals at next session. Patient is to continue seeing providers for her medical issues and medication.   Silvano Pacini, [facility]

## 2023-11-17 ENCOUNTER — Encounter: Payer: Self-pay | Admitting: Family Medicine

## 2023-11-24 NOTE — Progress Notes (Unsigned)
 [facility] [address] [place] [address] Phone: [phone]   Assessment and Plan:     1. Chronic bilateral low back pain with bilateral sciatica (Primary) 2. Degeneration of intervertebral disc of lumbar region with discogenic back pain and lower extremity pain 3. Rib pain - Chronic with exacerbation, initial visit - Chronic musculoskeletal pain in multiple areas for years, with worsening pain in low back, rib cage since May 2025 without specific MOI.  Most consistent with musculoskeletal dysfunction, degenerative changes in lumbar spine - X-rays obtained in clinic.  My interpretation: No acute fracture or dislocation.  Grade 2 anterolisthesis L4 on L5 - Start HEP and physical therapy targeting rib cage, back, core - Start meloxicam  15 mg daily x2 weeks.  If still having pain after 2 weeks, complete 3rd-week of NSAID. May use remaining NSAID as needed once daily for pain control.  Do not to use additional over-the-counter NSAIDs (ibuprofen, naproxen , Advil, Aleve , etc.) while taking prescription NSAIDs.  May use Tylenol  [phone] mg 2 to 3 times a day for breakthrough pain.  15 additional minutes spent for educating Therapeutic Home Exercise Program.  This included exercises focusing on stretching, strengthening, with focus on eccentric aspects.   Long term goals include an improvement in range of motion, strength, endurance as well as avoiding reinjury. Patient's frequency would include in 1-2 times a day, 3-5 times a week for a duration of 6-12 weeks. Proper technique shown and discussed handout in great detail with ATC.  All questions were discussed and answered.      Pertinent previous records reviewed include family medicine note 10/26/2023   Follow Up: 6 weeks for reevaluation.  If no improvement or worsening of symptoms, could consider lumbar MRI   Subjective:   I, Leah Powers, am serving as a Neurosurgeon for Doctor Morene Mace  Chief Complaint: multiple musculoskeletal pain  HPI:   11/25/2023 Patient is a 63 year old female with multiple complaints. Patient states late spring she started getting pain flares in her ribs down to the abdomin. Ice and topical creams don't help. Decreased ROM. Pain goes to the glutes. She is TTP throughout the upper back. She uses a cane in the am 4 pronged. States she can't take deep breathes. Sleeps on the floor or recliner when she is flared. States she had a fall in 1986 that did not heal properly. She isn't able to sleep through the night when flared. The worst of the pain has stopped since last month. Naproxen  does help when she is flared.     Relevant Historical Information: Hypertension, DM type II, hypothyroidism, neurodermatitis  Additional pertinent review of systems negative.   Current Outpatient Medications:    meloxicam  (MOBIC ) 15 MG tablet, Take 1 tablet (15 mg total) by mouth daily., Disp: 30 tablet, Rfl: 0   albuterol  (VENTOLIN  HFA) 108 (90 Base) MCG/ACT inhaler, Inhale 2 puffs into the lungs every 6 (six) hours as needed for wheezing or shortness of breath., Disp: 1 each, Rfl: 2   cetirizine (ZYRTEC) 5 MG tablet, 1 tablet Orally Once a day, Disp: , Rfl:    Cholecalciferol 75 MCG (3000 UT) TABS, 1 tablet Orally Once a day, Disp: , Rfl:    dapagliflozin  propanediol (FARXIGA ) 5 MG TABS tablet, Take 1 tablet (5 mg total) by mouth daily., Disp: 90 tablet, Rfl: 3   dicyclomine  (BENTYL ) 10 MG capsule, Take 1 capsule (10 mg total) by mouth 3 (three) times daily  before meals., Disp: 90 capsule, Rfl: 11   docusate sodium  (COLACE) 100 MG capsule, Take 100 mg by mouth 3 (three) times daily., Disp: , Rfl:    Elastic Bandages & Supports (MEDICAL COMPRESSION SOCKS) MISC, Wear compression stockings daily with upright activity. Remove at bedtime., Disp: 2 each, Rfl: 0   ferrous sulfate 325 (65 FE) MG EC tablet, Take 325 mg by mouth., Disp: , Rfl:    furosemide  (LASIX ) 20 MG  tablet, Take 1 tablet (20 mg total) by mouth daily., Disp: 90 tablet, Rfl: 3   gabapentin  (NEURONTIN ) 800 MG tablet, Take 1 tablet (800 mg total) by mouth 3 (three) times daily., Disp: 270 tablet, Rfl: 3   ketoconazole  (NIZORAL ) 2 % cream, Apply 1 Application topically 2 (two) times daily. Mix with over the counter Hydrocortisone  1%, Disp: 30 g, Rfl: 6   Lancets (ONETOUCH DELICA PLUS LANCET30G) MISC, Inject 1 Stick into the skin daily., Disp: 100 each, Rfl: 2   levothyroxine  (SYNTHROID ) 137 MCG tablet, TAKE 1 TABLET BY MOUTH DAILY BEFORE BREAKFAST, Disp: 90 tablet, Rfl: 0   linaclotide  (LINZESS ) 290 MCG CAPS capsule, Take 1 capsule (290 mcg total) by mouth daily before breakfast., Disp: 90 capsule, Rfl: 3   lithium  carbonate 300 MG capsule, TAKE 3 CAPSULES BY MOUTH EVERY NIGHT AT BEDTIME, Disp: 90 capsule, Rfl: 1   losartan  (COZAAR ) 50 MG tablet, Take 1 tablet (50 mg total) by mouth daily., Disp: 90 tablet, Rfl: 3   mupirocin  ointment (BACTROBAN ) 2 %, Apply topical to aa qd, Disp: 22 g, Rfl: 3   ONETOUCH VERIO test strip, 1 each by Other route daily., Disp: 100 each, Rfl: 3   pantoprazole  (PROTONIX ) 40 MG tablet, Take 1 tablet (40 mg total) by mouth daily., Disp: 90 tablet, Rfl: 3   rosuvastatin  (CRESTOR ) 10 MG tablet, TAKE 1 TABLET BY MOUTH DAILY, Disp: 90 tablet, Rfl: 3   Safety Seal Miscellaneous MISC, Apply 1 Application topically 3 (three) times daily. Medication Name: Neuro Relief (Gabapentin  4%, Ketoprofen 2%, Lidocaine  2%), Disp: 30 g, Rfl: 8   Semaglutide  (RYBELSUS ) 7 MG TABS, TAKE 1 TABLET BY MOUTH DAILY, Disp: 90 tablet, Rfl: 1   simethicone (MYLICON) 80 MG chewable tablet, 1 tablet Orally as needed, Disp: , Rfl:    Objective:     Vitals:   11/25/23 1354  Pulse: 71  SpO2: 96%  Weight: 234 lb (106.1 kg)  Height: 5' 8 (1.727 m)      Body mass index is 35.58 kg/m.    Physical Exam:    Gen: Appears well, nad, nontoxic and pleasant Psych: Alert and oriented, appropriate mood  and affect Neuro: sensation intact, strength is 5/5 in upper and lower extremities, muscle tone wnl Skin: no susupicious lesions or rashes  Back - Normal skin, Spine with normal alignment and no deformity.   No tenderness to vertebral process palpation.   Lumbar paraspinous muscles are   tender and without spasm  TTP gluteal musculature bilaterally, worse on left Straight leg raise negative Piriformis Test negative Gait normal    Electronically signed by:  Odis Mace D.[name] 2:32 PM 11/25/23

## 2023-11-25 ENCOUNTER — Ambulatory Visit (INDEPENDENT_AMBULATORY_CARE_PROVIDER_SITE_OTHER)

## 2023-11-25 ENCOUNTER — Ambulatory Visit (INDEPENDENT_AMBULATORY_CARE_PROVIDER_SITE_OTHER): Admitting: Sports Medicine

## 2023-11-25 VITALS — HR 71 | Ht 68.0 in | Wt 234.0 lb

## 2023-11-25 DIAGNOSIS — R0781 Pleurodynia: Secondary | ICD-10-CM | POA: Diagnosis not present

## 2023-11-25 DIAGNOSIS — M4807 Spinal stenosis, lumbosacral region: Secondary | ICD-10-CM | POA: Diagnosis not present

## 2023-11-25 DIAGNOSIS — M25559 Pain in unspecified hip: Secondary | ICD-10-CM | POA: Diagnosis not present

## 2023-11-25 DIAGNOSIS — M5442 Lumbago with sciatica, left side: Secondary | ICD-10-CM | POA: Diagnosis not present

## 2023-11-25 DIAGNOSIS — G8929 Other chronic pain: Secondary | ICD-10-CM

## 2023-11-25 DIAGNOSIS — M51362 Other intervertebral disc degeneration, lumbar region with discogenic back pain and lower extremity pain: Secondary | ICD-10-CM

## 2023-11-25 DIAGNOSIS — M545 Low back pain, unspecified: Secondary | ICD-10-CM

## 2023-11-25 DIAGNOSIS — M5441 Lumbago with sciatica, right side: Secondary | ICD-10-CM

## 2023-11-25 DIAGNOSIS — M4316 Spondylolisthesis, lumbar region: Secondary | ICD-10-CM | POA: Diagnosis not present

## 2023-11-25 DIAGNOSIS — M5136 Other intervertebral disc degeneration, lumbar region with discogenic back pain only: Secondary | ICD-10-CM | POA: Diagnosis not present

## 2023-11-25 DIAGNOSIS — M160 Bilateral primary osteoarthritis of hip: Secondary | ICD-10-CM | POA: Diagnosis not present

## 2023-11-25 DIAGNOSIS — M5137 Other intervertebral disc degeneration, lumbosacral region with discogenic back pain only: Secondary | ICD-10-CM | POA: Diagnosis not present

## 2023-11-25 MED ORDER — MELOXICAM 15 MG PO TABS: 15.0000 mg | ORAL_TABLET | Freq: Every day | ORAL | 0 refills | Status: DC

## 2023-11-25 NOTE — Patient Instructions (Signed)
-   Start meloxicam  15 mg daily x3 weeks. May use remaining NSAID as needed once daily for pain control.  Do not to use additional over-the-counter NSAIDs (ibuprofen, naproxen , Advil, Aleve , etc.) while taking prescription NSAIDs.  May use Tylenol  [phone] mg 2 to 3 times a day for breakthrough pain.  Pt referral   HEP   6 week follow up

## 2023-12-03 ENCOUNTER — Encounter: Payer: Self-pay | Admitting: Psychiatry

## 2023-12-03 ENCOUNTER — Ambulatory Visit: Payer: Self-pay | Admitting: Sports Medicine

## 2023-12-03 ENCOUNTER — Ambulatory Visit: Admitting: Psychiatry

## 2023-12-03 DIAGNOSIS — F319 Bipolar disorder, unspecified: Secondary | ICD-10-CM | POA: Diagnosis not present
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
## 2023-12-09 ENCOUNTER — Encounter: Payer: Self-pay | Admitting: Physician Assistant

## 2023-12-09 ENCOUNTER — Ambulatory Visit (INDEPENDENT_AMBULATORY_CARE_PROVIDER_SITE_OTHER): Admitting: Physician Assistant

## 2023-12-09 DIAGNOSIS — E032 Hypothyroidism due to medicaments and other exogenous substances: Secondary | ICD-10-CM

## 2023-12-09 DIAGNOSIS — Z79899 Other long term (current) drug therapy: Secondary | ICD-10-CM

## 2023-12-09 DIAGNOSIS — I10 Essential (primary) hypertension: Secondary | ICD-10-CM | POA: Diagnosis not present

## 2023-12-09 DIAGNOSIS — F319 Bipolar disorder, unspecified: Secondary | ICD-10-CM

## 2023-12-09 MED ORDER — LITHIUM CARBONATE 300 MG PO CAPS: 900.0000 mg | ORAL_CAPSULE | Freq: Every day | ORAL | 3 refills | Status: AC

## 2023-12-09 MED ORDER — LEVOTHYROXINE SODIUM 137 MCG PO TABS: 137.0000 ug | ORAL_TABLET | Freq: Every day | ORAL | 3 refills | Status: AC

## 2023-12-09 NOTE — Progress Notes (Signed)
 [facility] Med Check  Patient ID: Leah Powers,  MRN: [id number]  PCP: Wendolyn Jenkins Jansky, MD  Date of Evaluation: 12/09/2023 Time spent:35 minutes  Chief Complaint:  Chief Complaint   Follow-up    HISTORY/CURRENT STATUS: HPI for routine med check.  Doing well with current meds. She is able to enjoy things.  Energy and motivation are good.  No extreme sadness, tearfulness, or feelings of hopelessness.  Sleeps well most of the time. ADLs and personal hygiene are normal.   Denies any changes in concentration, making decisions, or remembering things.  Appetite has not changed.  Weight is stable. No c/o anxiety.  No SI/HI.  Patient denies increased energy with decreased need for sleep, increased talkativeness, racing thoughts, impulsivity or risky behaviors, increased spending, increased libido, grandiosity, increased irritability or anger, paranoia, or hallucinations.  Review of Systems  Constitutional: Negative.   HENT: Negative.    Eyes: Negative.   Respiratory: Negative.    Cardiovascular: Negative.   Gastrointestinal: Negative.   Genitourinary: Negative.   Musculoskeletal:  Positive for back pain.  Skin: Negative.   Neurological: Negative.   Endo/Heme/Allergies: Negative.   Psychiatric/Behavioral:         See HPI   Individual Medical History/ Review of Systems: Changes? :Yes    Back pain with L-spine stenosis, Has gastric ulcers since LOV  Past medications for mental health diagnoses include: Prolixin, Haldol, Ativan, lithium , Xanax, Synthroid , gabapentin   Allergies: Eucalyptus oil, Haldol [haloperidol decanoate], Molds & smuts, Other, Penicillins, Bee venom, Corn oil, Corn-containing products, Demeclocycline, Diflunisal, Haloperidol lactate, Hydrochlorothiazide , Hydrocodone-acetaminophen , Hydromorphone , Imipramine, Metformin, Metformin and related, Nitrofurantoin, Penicillin g, Sulfamethoxazole-trimethoprim, Tetracycline hcl, Vicodin [hydrocodone-acetaminophen ], Yellow  jacket venom, Cephalexin , Doxycycline, Erythromycin, Macrodantin, Motrin [ibuprofen], Septra [bactrim], Tetracyclines & related, and Tofranil-pm  Current Medications:  Current Outpatient Medications:    albuterol  (VENTOLIN  HFA) 108 (90 Base) MCG/ACT inhaler, Inhale 2 puffs into the lungs every 6 (six) hours as needed for wheezing or shortness of breath., Disp: 1 each, Rfl: 2   cetirizine (ZYRTEC) 5 MG tablet, 1 tablet Orally Once a day, Disp: , Rfl:    dapagliflozin  propanediol (FARXIGA ) 5 MG TABS tablet, Take 1 tablet (5 mg total) by mouth daily., Disp: 90 tablet, Rfl: 3   dicyclomine  (BENTYL ) 10 MG capsule, Take 1 capsule (10 mg total) by mouth 3 (three) times daily before meals., Disp: 90 capsule, Rfl: 11   docusate sodium  (COLACE) 100 MG capsule, Take 100 mg by mouth 3 (three) times daily., Disp: , Rfl:    Elastic Bandages & Supports (MEDICAL COMPRESSION SOCKS) MISC, Wear compression stockings daily with upright activity. Remove at bedtime., Disp: 2 each, Rfl: 0   ferrous sulfate 325 (65 FE) MG EC tablet, Take 325 mg by mouth., Disp: , Rfl:    furosemide  (LASIX ) 20 MG tablet, Take 1 tablet (20 mg total) by mouth daily., Disp: 90 tablet, Rfl: 3   gabapentin  (NEURONTIN ) 800 MG tablet, Take 1 tablet (800 mg total) by mouth 3 (three) times daily., Disp: 270 tablet, Rfl: 3   Lancets (ONETOUCH DELICA PLUS LANCET30G) MISC, Inject 1 Stick into the skin daily., Disp: 100 each, Rfl: 2   linaclotide  (LINZESS ) 290 MCG CAPS capsule, Take 1 capsule (290 mcg total) by mouth daily before breakfast., Disp: 90 capsule, Rfl: 3   losartan  (COZAAR ) 50 MG tablet, Take 1 tablet (50 mg total) by mouth daily., Disp: 90 tablet, Rfl: 3   meloxicam  (MOBIC ) 15 MG tablet, Take 1 tablet (15 mg total) by mouth daily., Disp: 30  tablet, Rfl: 0   mupirocin  ointment (BACTROBAN ) 2 %, Apply topical to aa qd, Disp: 22 g, Rfl: 3   ONETOUCH VERIO test strip, 1 each by Other route daily., Disp: 100 each, Rfl: 3   pantoprazole   (PROTONIX ) 40 MG tablet, Take 1 tablet (40 mg total) by mouth daily., Disp: 90 tablet, Rfl: 3   rosuvastatin  (CRESTOR ) 10 MG tablet, TAKE 1 TABLET BY MOUTH DAILY, Disp: 90 tablet, Rfl: 3   Safety Seal Miscellaneous MISC, Apply 1 Application topically 3 (three) times daily. Medication Name: Neuro Relief (Gabapentin  4%, Ketoprofen 2%, Lidocaine  2%), Disp: 30 g, Rfl: 8   Semaglutide  (RYBELSUS ) 7 MG TABS, TAKE 1 TABLET BY MOUTH DAILY, Disp: 90 tablet, Rfl: 1   Cholecalciferol 75 MCG (3000 UT) TABS, 1 tablet Orally Once a day, Disp: , Rfl:    levothyroxine  (SYNTHROID ) 137 MCG tablet, Take 1 tablet (137 mcg total) by mouth daily before breakfast., Disp: 90 tablet, Rfl: 3   lithium  carbonate 300 MG capsule, Take 3 capsules (900 mg total) by mouth daily., Disp: 90 capsule, Rfl: 3   simethicone (MYLICON) 80 MG chewable tablet, 1 tablet Orally as needed, Disp: , Rfl:  Medication Side Effects: none  Family Medical/ Social History: Changes? no  MENTAL HEALTH EXAM:  Last menstrual period 07/30/2013.There is no height or weight on file to calculate BMI.  General Appearance: Casual, Well Groomed, and Obese  Eye Contact:  Good  Speech:  Clear and Coherent, Normal Rate, and Talkative  Volume:  Normal  Mood:  Euthymic  Affect:  Congruent  Thought Process:  Goal Directed and Descriptions of Associations: Circumstantial  Orientation:  Full (Time, Place, and Person)  Thought Content: Logical   Suicidal Thoughts:  No  Homicidal Thoughts:  No  Memory:  WNL  Judgement:  Good  Insight:  Good  Psychomotor Activity:  Normal  Concentration:  Concentration: Good and Attention Span: Good  Recall:  Good  Fund of Knowledge: Good  Language: Good  Assets:  Desire for Improvement Financial Resources/Insurance Housing Resilience Social Support Transportation  ADL's:  Intact  Cognition: WNL  Prognosis:  Good   Labs reviewed from 10/26/2023  DIAGNOSES:    ICD-10-CM   1. Bipolar I disorder (HCC)  F31.9  Lithium  level    TSH    2. 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 Encounter for long-term (current) use of medications  Z79.899 Lithium  level    TSH    4. Primary hypertension  I10      Receiving Psychotherapy: No    RECOMMENDATIONS:  PDMP was reviewed.  Gabapentin  filled 09/27/2023. I provided approximately  35 minutes of face to face time during this encounter, including time spent before and after the visit in records review, medical decision making, counseling pertinent to today's visit, and charting.   She's stable on current meds so no changes need to be made.  Continue gabapentin  800 mg, 1 p.o. 3 times daily. Continue levothyroxine  137 mcg q am. Continue lithium  300 mg, 3 p.o. nightly. Labs ordered as above.  Cont counseling w/ Silvano Pacini, [facility]. Return in 6 months.   Verneita Cooks, PA-C

## 2023-12-14 ENCOUNTER — Ambulatory Visit: Admitting: Physical Therapy

## 2023-12-14 ENCOUNTER — Encounter: Payer: Self-pay | Admitting: Podiatry

## 2023-12-14 ENCOUNTER — Other Ambulatory Visit (HOSPITAL_COMMUNITY): Payer: Self-pay

## 2023-12-14 ENCOUNTER — Ambulatory Visit: Admitting: Podiatry

## 2023-12-14 DIAGNOSIS — E1149 Type 2 diabetes mellitus with other diabetic neurological complication: Secondary | ICD-10-CM | POA: Diagnosis not present

## 2023-12-14 DIAGNOSIS — M79676 Pain in unspecified toe(s): Secondary | ICD-10-CM | POA: Diagnosis not present

## 2023-12-14 DIAGNOSIS — B351 Tinea unguium: Secondary | ICD-10-CM

## 2023-12-14 DIAGNOSIS — E1140 Type 2 diabetes mellitus with diabetic neuropathy, unspecified: Secondary | ICD-10-CM

## 2023-12-14 NOTE — Progress Notes (Signed)
This patient returns to my office for at risk foot care.  This patient requires this care by a professional since this patient will be at risk due to having type 2 diabetes. This patient is unable to cut nails herself since the patient cannot reach her nails.These nails are painful walking and wearing shoes.  This patient presents for at risk foot care today.  General Appearance  Alert, conversant and in no acute stress.  Vascular  Dorsalis pedis and posterior tibial  pulses are palpable  bilaterally.  Capillary return is within normal limits  bilaterally. Temperature is within normal limits  bilaterally.  Neurologic  Senn-Weinstein monofilament wire test within normal limits  bilaterally. Muscle power within normal limits bilaterally.  Nails Thick disfigured discolored nails with subungual debris  from hallux to fifth toes bilaterally. No evidence of bacterial infection or drainage bilaterally.  Orthopedic  No limitations of motion  feet .  No crepitus or effusions noted.  No bony pathology or digital deformities noted.  Skin  normotropic skin with no porokeratosis noted bilaterally.  No signs of infections or ulcers noted.     Onychomycosis  Pain in right toes  Pain in left toes  Consent was obtained for treatment procedures.   Mechanical debridement of nails 1-5  bilaterally performed with a nail nipper.  Filed with dremel without incident.    Return office visit    3 months                  Told patient to return for periodic foot care and evaluation due to potential at risk complications.   Shaunta Oncale DPM   

## 2023-12-17 ENCOUNTER — Other Ambulatory Visit (HOSPITAL_COMMUNITY): Payer: Self-pay

## 2023-12-17 ENCOUNTER — Telehealth: Payer: Self-pay

## 2023-12-17 NOTE — Telephone Encounter (Signed)
 Pharmacy Patient Advocate Encounter  Received notification from [employer] that Prior Authorization for Rybelsus  7MG  tablets  has been APPROVED from 12/17/23 to 12/16/24. Ran test claim, Copay is $4. This test claim was processed through [facility]- copay amounts may vary at other pharmacies due to pharmacy/plan contracts, or as the patient moves through the different stages of their insurance plan.   PA #/Case ID/Reference #: [id number]

## 2023-12-17 NOTE — Therapy (Signed)
 OUTPATIENT PHYSICAL THERAPY LOWER EXTREMITY EVALUATION   Patient Name: Leah Powers MRN: [id number] DOB:01-Jul-1960, 63 y.o., female Today's Date: 12/18/2023  END OF SESSION:  PT End of Session - 12/18/23 1242     Visit Number 1    Number of Visits 7    Date for Recertification  03/18/24    Authorization Type  MEDICAID [employer]    Authorization - Visit Number 1    PT Start Time 1230    PT Stop Time 1315    PT Time Calculation (min) 45 min    Activity Tolerance Patient tolerated treatment well    Behavior During Therapy WFL for tasks assessed/performed          Past Medical History:  Diagnosis Date   Allergy    Arthritis    L hand, R ankle  (10/08/2016)   Bipolar 1 disorder (HCC)    Depression    Diabetes mellitus without complication (HCC)    Fibromyalgia    told that the breakout on the upper back , could be over active nerves , also treated with cream & Gabapentin - [facility] [place]    History of kidney stones    spontaneous passing   Hypertension    Hypothyroid    Pneumonia ~ 1967; 07/2016   Pyelonephritis 1997   treated with IV antibiotic    Past Surgical History:  Procedure Laterality Date   BIOPSY  11/04/2022   Procedure: BIOPSY;  Surgeon: Unk Corinn Skiff, MD;  Location: [facility] ENDOSCOPY;  Service: Gastroenterology;;   BLADDER INSTILLATION  X 2   CHALAZION EXCISION Bilateral    R- eye X2 , L eye- X1   COLONOSCOPY     COLONOSCOPY WITH PROPOFOL  N/A 11/04/2022   Procedure: COLONOSCOPY WITH PROPOFOL ;  Surgeon: Unk Corinn Skiff, MD;  Location: [facility] ENDOSCOPY;  Service: Gastroenterology;  Laterality: N/A;  Patient does not want to be the first patient.  Prefers mid-morning   COLONOSCOPY WITH PROPOFOL  N/A 02/05/2023   Procedure: COLONOSCOPY WITH PROPOFOL ;  Surgeon: Unk Corinn Skiff, MD;  Location: [facility] ENDOSCOPY;  Service: Gastroenterology;  Laterality: N/A;   ESOPHAGOGASTRODUODENOSCOPY (EGD) WITH PROPOFOL  N/A 11/04/2022    Procedure: ESOPHAGOGASTRODUODENOSCOPY (EGD) WITH PROPOFOL ;  Surgeon: Unk Corinn Skiff, MD;  Location: [facility] ENDOSCOPY;  Service: Gastroenterology;  Laterality: N/A;   FOOT ARTHRODESIS Right 10/08/2016   Procedure: RIGHT TRIPLE ARTHRODESIS;  Surgeon: Harden Jerona GAILS, MD;  [facility] OR;  Service: Orthopedics;  Laterality: Right;   FOOT ARTHRODESIS, TRIPLE Right 10/08/2016   ankle   FRENULECTOMY, LINGUAL  1970s   LAPAROSCOPIC CHOLECYSTECTOMY  1996   PALATE SURGERY  1970s   tissue removed from upper palate as a child    URETHRAL DILATION     Patient Active Problem List   Diagnosis Date Noted   Benign essential hypertension 08/27/2023   Class 2 obesity 08/27/2023   Decreased estrogen level 08/27/2023   Morbid obesity (HCC) 08/27/2023   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   Hyperlipidemia associated with type 2 diabetes mellitus (HCC) 04/27/2023   Absolute anemia 12/30/2022   History of colon polyps 11/04/2022   Nausea and vomiting 11/04/2022   Gastritis 11/04/2022   Local edema 07/30/2022   Mild intermittent asthma without complication 07/30/2022   Vitamin D  deficiency 07/30/2022   Snoring 01/24/2019   AKI (acute kidney injury) 05/20/2018   Lithium  toxicity 05/20/2018   Type 2 diabetes mellitus without complication, without long-term current use of insulin  (HCC) 05/20/2018   Bipolar I disorder (HCC) 02/04/2018  S/P ankle fusion 11/12/2016   Asymmetric SNHL (sensorineural hearing loss) 11/11/2016   Posterior tibial tendon dysfunction (PTTD) of right lower extremity 10/08/2016   Neurodermatitis 07/30/2015   HTN (hypertension) 05/31/2014   Hypothyroidism 05/31/2014    PCP: Wendolyn Jenkins Jansky, MD   REFERRING PROVIDER: Leonce Katz, DO   REFERRING DIAG:  M54.42,M54.41,G89.29 (ICD-10-CM) - Chronic bilateral low back pain with bilateral sciatica  M51.362 (ICD-10-CM) - Degeneration of intervertebral disc of lumbar region with discogenic back pain and lower extremity pain   R07.81 (ICD-10-CM) - Rib pain    THERAPY DIAG:  Other low back pain  Muscle weakness (generalized)  Abnormal posture  Rationale for Evaluation and Treatment: Rehabilitation  ONSET DATE: 4 months ago  SUBJECTIVE:   SUBJECTIVE STATEMENT: Pt stated around June she developed low back and rib pain insidiously to where she needed to sleep on the floor and use cold packs. Pt has started exs and meloxicam  c Dr. Leonce and she is doing better. Pt notes she is a side sleeper.  PERTINENT HISTORY: High BMI, as above  PAIN:  Are you having pain? Yes: NPRS scale: 6/10 Pain location: low back, L>R, and ribs Pain description: throb Aggravating factors: Prolonged standing, 5-10 mins Relieving factors: Sit, rest  PRECAUTIONS: None  RED FLAGS: None   WEIGHT BEARING RESTRICTIONS: No  FALLS:  Has patient fallen in last 6 months? No  LIVING ENVIRONMENT: Lives with: lives with their family Lives in: House/apartment Able to access home  OCCUPATION: retired  PLOF: Independent  PATIENT GOALS: To live more comfortably, be a little more active, better QOL   NEXT MD VISIT: 01/06/24  OBJECTIVE:  Note: Objective measures were completed at Evaluation unless otherwise noted.  DIAGNOSTIC FINDINGS:  12/02/23 IMPRESSION: Negative radiographs of the ribs.  IMPRESSION: 1. Multilevel degenerative disc disease and facet hypertrophy, most prominent at L4-L5 and L5-S1. 2. Grade 1/2 anterolisthesis of L4 on L5.  IMPRESSION: Mild bilateral hip osteoarthritis.  PATIENT SURVEYS:  Modified Oswestry: 17/50= 34%  Minimally Clinically Important Difference (MCID) = 12.8%  COGNITION: Overall cognitive status: Within functional limits for tasks assessed     SENSATION: WFL  EDEMA:  NA  MUSCLE LENGTH: Hamstrings: Right WNLs deg; Left WNLs deg Debby test: Right Tight deg; Left Tight deg  POSTURE: rounded shoulders, forward head, and increased lumbar lordosisgenu valgum, pes  planus  PALPATION: TTP on the R QL and upper gluteal muscles  LUMBAR ROM:   AROM eval  Flexion Full, lordosis  Extension Mod limited  Right lateral flexion Min limited  Left lateral flexion Min limited  Right rotation Full  Left rotation Full, L thoracic pain   (Blank rows = not tested)  LOWER EXTREMITY ROM:     WNLs Active  Right eval Left eval  Hip flexion    Hip extension    Hip abduction    Hip adduction    Hip internal rotation    Hip external rotation    Knee flexion    Knee extension    Ankle dorsiflexion    Ankle plantarflexion    Ankle inversion    Ankle eversion     (Blank rows = not tested)  LOWER EXTREMITY MMT:    Myotome screen negative MMT Right eval Left eval  Hip flexion 4 4  Hip extension    Hip abduction 3+ 3+  Hip adduction    Hip internal rotation    Hip external rotation 3+ 3+  Knee flexion    Knee extension  Ankle dorsiflexion    Ankle plantarflexion    Ankle inversion    Ankle eversion     (Blank rows = not tested)  LUMBAR SPECIAL TESTS:  Straight leg raise test: Negative and Slump test: Negative Weak core  FUNCTIONAL TESTS:  5 times sit to stand: TBA  GAIT: Distance walked: 200' Assistive device utilized: None Level of assistance: Complete Independence Comments: WNLs                                                                                                          TREATMENT DATE:  [facility] Adult PT Treatment:                                                DATE: 12/18/23 Therapeutic Exercise: Developed, instructed in, and pt completed therex as noted in HEP    PATIENT EDUCATION:  Education details: Eval findings, POC, HEP, self care  Person educated: Patient Education method: Explanation, Demonstration, Tactile cues, Verbal cues, and Handouts Education comprehension: verbalized understanding, returned demonstration, verbal cues required, and tactile cues required  HOME EXERCISE PROGRAM: Access Code:  [email or web address] URL: [email or web address] Date: 12/18/2023 Prepared by: Dasie Daft  Exercises - Hooklying Hamstring Stretch with Strap  - 1 x daily - 7 x weekly - 1 sets - 3 reps - 30 hold - Prone Quadriceps Stretch with Strap  - 1 x daily - 7 x weekly - 1 sets - 3 reps - 30 hold - Supine Lower Trunk Rotation  - 5 x daily - 7 x weekly - 1 sets - 5 reps - 5 hold - Child's Pose Stretch  - 1 x daily - 7 x weekly - 1 sets - 5 reps - 20 hold - Supine Piriformis Stretch with Foot on Ground  - 1 x daily - 7 x weekly - 1 sets - 3 reps - 30 hold - Seated Flexion Stretch with Swiss Ball  - 1 x daily - 7 x weekly - 1 sets - 5 reps - 20 hold - Supine Posterior Pelvic Tilt  - 1 x daily - 7 x weekly - 3 sets - 10 reps - 3 hold - Supine Bridge with Resistance Band  - 1 x daily - 7 x weekly - 2 sets - 10 reps - 3 hold - Sidelying Hip Abduction  - 1 x daily - 7 x weekly - 2 sets - 10 reps - 3 hold - Clamshell with Resistance  - 1 x daily - 7 x weekly - 2 sets - 10 reps - 3 hold  ASSESSMENT:  CLINICAL IMPRESSION: Patient is a 63 y.o. female who was seen today for physical therapy evaluation and treatment for  M54.42,M54.41,G89.29 (ICD-10-CM) - Chronic bilateral low back pain with bilateral sciatica  M51.362 (ICD-10-CM) - Degeneration of intervertebral disc of lumbar region with discogenic back pain and lower extremity pain  R07.81 (ICD-10-CM) - Rib pain  Pt presents with signs  and symptoms consistent with her referral and imaging. Pt's demonstrates weak hips and core and postural deficits. A HEP was started to address deficits. Pt will benefit from skilled PT to address impairments to optimize back function with less pain.   OBJECTIVE IMPAIRMENTS: decreased activity tolerance, decreased strength, impaired flexibility, postural dysfunction, pain, and High BMI.   ACTIVITY LIMITATIONS: carrying, lifting, standing, and squatting  PARTICIPATION LIMITATIONS: meal prep, cleaning, laundry, shopping, and  community activity  PERSONAL FACTORS: Fitness, Past/current experiences, Time since onset of injury/illness/exacerbation, and 1 comorbidity: high BMI are also affecting patient's functional outcome.   REHAB POTENTIAL: Good  CLINICAL DECISION MAKING: Evolving/moderate complexity  EVALUATION COMPLEXITY: Moderate   GOALS:  SHORT TERM GOALS: Target date: 12/18/23 Pt will be Ind in an initial HEP  Baseline:started Goal status: INITIAL  2.  Pt will report 25% or greater improvement in her back and rib pain for improved function and  QOL Baseline: 6/10 Goal status: INITIAL  LONG TERM GOALS: Target date: 03/18/24  Pt will be Ind in a final HEP to maintain achieved LOF  Baseline:  Goal status: INITIAL  2.   Pt will report 75% or greater improvement in her back and rib pain for improved function and  QOL Baseline: 6/10 Goal status: INITIAL  3.  Increase bilat hip strength to 4/5 or greater for improved lumbopelvic stability Baseline: see flow sheet Goal status: INITIAL  4.  Improve 5xSTS by MCID as indication of improved functional mobility  Baseline:  Goal status: INITIAL  5.  Pt's mod Oswestry score will improved to 21% as indication of improved function  Baseline: 34% Goal status: INITIAL  6.  Pt will demonstrate improved body mechanics for household activities to minimize back strain Baseline:  Goal status: INITIAL   PLAN:  PT FREQUENCY: 1x per 2 weeks  PT DURATION: 12 weeks  PLANNED INTERVENTIONS: 97164- PT Re-evaluation, 97110-Therapeutic exercises, 97530- Therapeutic activity, 97112- Neuromuscular re-education, 97535- Self Care, 02859- Manual therapy, [id number]- Aquatic Therapy, H9716- Electrical stimulation (unattended), 20560 (1-2 muscles), 20561 (3+ muscles)- Dry Needling, Patient/Family education, Taping, Joint mobilization, Cryotherapy, and Moist heat  PLAN FOR NEXT SESSION: Assess response to HEP; progress therex as indicated; use of modalities, manual therapy;  and TPDN as indicated.    Amilya Haver MS, PT 12/18/23 3:38 PM

## 2023-12-17 NOTE — Telephone Encounter (Signed)
 Pharmacy Patient Advocate Encounter   Received notification from [employer] that prior authorization for Rybelsus  7MG  tablets is required/requested.   Insurance verification completed.   The patient is insured through [facility].   Per test claim: PA required; PA submitted to above mentioned insurance via Latent Key/confirmation #/EOC [id number] Status is pending

## 2023-12-18 ENCOUNTER — Ambulatory Visit: Attending: Sports Medicine

## 2023-12-18 ENCOUNTER — Other Ambulatory Visit: Payer: Self-pay

## 2023-12-18 DIAGNOSIS — G8929 Other chronic pain: Secondary | ICD-10-CM | POA: Insufficient documentation

## 2023-12-18 DIAGNOSIS — M5459 Other low back pain: Secondary | ICD-10-CM | POA: Diagnosis present

## 2023-12-18 DIAGNOSIS — M5442 Lumbago with sciatica, left side: Secondary | ICD-10-CM | POA: Insufficient documentation

## 2023-12-18 DIAGNOSIS — M5441 Lumbago with sciatica, right side: Secondary | ICD-10-CM | POA: Insufficient documentation

## 2023-12-18 DIAGNOSIS — R0789 Other chest pain: Secondary | ICD-10-CM | POA: Insufficient documentation

## 2023-12-18 DIAGNOSIS — M51362 Other intervertebral disc degeneration, lumbar region with discogenic back pain and lower extremity pain: Secondary | ICD-10-CM | POA: Diagnosis not present

## 2023-12-18 DIAGNOSIS — M6281 Muscle weakness (generalized): Secondary | ICD-10-CM | POA: Diagnosis present

## 2023-12-18 DIAGNOSIS — R293 Abnormal posture: Secondary | ICD-10-CM | POA: Diagnosis present

## 2023-12-24 ENCOUNTER — Ambulatory Visit: Admitting: Psychiatry

## 2023-12-24 ENCOUNTER — Encounter: Payer: Self-pay | Admitting: Psychiatry

## 2023-12-24 DIAGNOSIS — F319 Bipolar disorder, unspecified: Secondary | ICD-10-CM | POA: Diagnosis not present

## 2023-12-24 NOTE — Progress Notes (Signed)
      [facility] Counselor/Therapist Progress Note  Patient ID: Leah Powers, MRN: [id number],    Date: 12/24/2023  Time Spent: 59 minutes start time 1:01 PM end time 2:00 PM  Treatment Type: Individual Therapy  Reported Symptoms: anxiety, grief issues, sadness, rumination, health issues  Mental Status Exam:  Appearance:   Well Groomed     Behavior:  Appropriate  Motor:  Normal  Speech/Language:   Normal Rate  Affect:  Appropriate  Mood:  anxious  Thought process:  normal  Thought content:    WNL  Sensory/Perceptual disturbances:    WNL  Orientation:  oriented to person, place, time/date, and situation  Attention:  Good  Concentration:  Good  Memory:  WNL  Fund of knowledge:   Good  Insight:    Good  Judgment:   Good  Impulse Control:  Good   Risk Assessment: Danger to Self:  No Self-injurious Behavior: No Danger to Others: No Duty to Warn:no Physical Aggression / Violence:No  Access to Firearms a concern: No  Gang Involvement:No   Subjective: Patient was present for session. She shared she has been going to physical therapy and that has been a good thing. She is working with her doctor on her medical issues and has an appointment soon to talk to her PCP about issues.  She went on to share she had incident with a load noise but she was not triggered and she was very excited. She went on to share that a member of her church passed away and that got her thinking and triggered some grief issues.  Allowed her to discuss some of those issues and concerns.  Patient brought in pictures to share with clinician from the past.  She had finished her paper number and found that to be very helpful and therapeutic.  Encouraged her to continue making sure to do her painting and keep herself as grounded as possible.  Patient updated treatment plan and goals in session.  Patient had done so much better with the trauma issues that had been impacting her mood she developed some new goals in  session.  Patient and clinician discussed cognitive distortions and the importance of trying to recognize them and how to untwisting them.  Patient was given a handout in session to work on over the next week.  Interventions: Cognitive Behavioral Therapy and Insight-Oriented  Diagnosis:   ICD-10-CM   1. Bipolar I disorder (HCC)  F31.9       Plan:  Patient is to practice grounding skills and breathing exercises addressed in treatment.  Patient is to work on cognitive distortions to discuss further at next session.  Patient is to practice DBT skill T IPP and self brain spotting to help calm herself. Patient is to continue seeing providers for her medical issues and medication.   Silvano Pacini, [facility]

## 2023-12-28 DIAGNOSIS — E032 Hypothyroidism due to medicaments and other exogenous substances: Secondary | ICD-10-CM | POA: Diagnosis not present

## 2023-12-28 DIAGNOSIS — Z79899 Other long term (current) drug therapy: Secondary | ICD-10-CM | POA: Diagnosis not present

## 2023-12-29 ENCOUNTER — Ambulatory Visit: Payer: Self-pay | Admitting: Physician Assistant

## 2023-12-29 LAB — TSH: TSH: 0.512 u[IU]/mL (ref 0.450–4.500)

## 2023-12-29 LAB — LITHIUM LEVEL: Lithium Lvl: 0.5 mmol/L (ref 0.5–1.2)

## 2023-12-30 ENCOUNTER — Ambulatory Visit

## 2023-12-30 ENCOUNTER — Encounter: Payer: Self-pay | Admitting: Family Medicine

## 2023-12-30 ENCOUNTER — Ambulatory Visit: Admitting: Family Medicine

## 2023-12-30 ENCOUNTER — Ambulatory Visit: Payer: Self-pay | Admitting: Family Medicine

## 2023-12-30 VITALS — BP 132/76 | HR 64 | Temp 98.4°F | Ht 68.0 in | Wt 235.0 lb

## 2023-12-30 DIAGNOSIS — E785 Hyperlipidemia, unspecified: Secondary | ICD-10-CM

## 2023-12-30 DIAGNOSIS — I10 Essential (primary) hypertension: Secondary | ICD-10-CM | POA: Diagnosis not present

## 2023-12-30 DIAGNOSIS — E119 Type 2 diabetes mellitus without complications: Secondary | ICD-10-CM | POA: Diagnosis not present

## 2023-12-30 DIAGNOSIS — D500 Iron deficiency anemia secondary to blood loss (chronic): Secondary | ICD-10-CM

## 2023-12-30 DIAGNOSIS — E1169 Type 2 diabetes mellitus with other specified complication: Secondary | ICD-10-CM

## 2023-12-30 DIAGNOSIS — Z7984 Long term (current) use of oral hypoglycemic drugs: Secondary | ICD-10-CM | POA: Diagnosis not present

## 2023-12-30 LAB — HEMOGLOBIN A1C: Hgb A1c MFr Bld: 6 % (ref 4.6–6.5)

## 2023-12-30 LAB — CBC WITH DIFFERENTIAL/PLATELET
Basophils Absolute: 0.1 K/uL (ref 0.0–0.1)
Basophils Relative: 0.7 % (ref 0.0–3.0)
Eosinophils Absolute: 0.3 K/uL (ref 0.0–0.7)
Eosinophils Relative: 3.9 % (ref 0.0–5.0)
HCT: 47.8 % — ABNORMAL HIGH (ref 36.0–46.0)
Hemoglobin: 15.3 g/dL — ABNORMAL HIGH (ref 12.0–15.0)
Lymphocytes Relative: 23 % (ref 12.0–46.0)
Lymphs Abs: 1.6 K/uL (ref 0.7–4.0)
MCHC: 32.1 g/dL (ref 30.0–36.0)
MCV: 91.1 fl (ref 78.0–100.0)
Monocytes Absolute: 0.4 K/uL (ref 0.1–1.0)
Monocytes Relative: 6 % (ref 3.0–12.0)
Neutro Abs: 4.5 K/uL (ref 1.4–7.7)
Neutrophils Relative %: 66.4 % (ref 43.0–77.0)
Platelets: 274 K/uL (ref 150.0–400.0)
RBC: 5.25 Mil/uL — ABNORMAL HIGH (ref 3.87–5.11)
RDW: 14.2 % (ref 11.5–15.5)
WBC: 6.8 K/uL (ref 4.0–10.5)

## 2023-12-30 LAB — IBC + FERRITIN
Ferritin: 15.9 ng/mL (ref 10.0–291.0)
Iron: 85 ug/dL (ref 42–145)
Saturation Ratios: 19.5 % — ABNORMAL LOW (ref 20.0–50.0)
TIBC: 436.8 ug/dL (ref 250.0–450.0)
Transferrin: 312 mg/dL (ref 212.0–360.0)

## 2023-12-30 LAB — VITAMIN B12: Vitamin B-12: 647 pg/mL (ref 211–911)

## 2023-12-30 MED ORDER — DAPAGLIFLOZIN PROPANEDIOL 5 MG PO TABS: 5.0000 mg | ORAL_TABLET | Freq: Every day | ORAL | 3 refills | Status: AC

## 2023-12-30 MED ORDER — RYBELSUS 7 MG PO TABS: 1.0000 | ORAL_TABLET | Freq: Every day | ORAL | 1 refills | Status: AC

## 2023-12-30 NOTE — Progress Notes (Signed)
 Subjective:     Patient ID: Leah Powers, female    DOB: Nov 19, 1960, 63 y.o.   MRN: [id number]  Chief Complaint  Patient presents with   Diabetes    33mo; doing better, but may want to discuss a nutritionist; also want to discuss dosage of Rybellsus    Medical Management of Chronic Issues    Want to discuss medications given by gastro; doctor left practice    Discussed the use of AI scribe software for clinical note transcription with the patient, who gave verbal consent to proceed.  History of Present Illness Leah Powers is a 63 year old female who presents for follow-up on her gastrointestinal and musculoskeletal issues.  She recently underwent a colonoscopy on November 02, 2023, which identified somatocytopenia and confirmed constipation. She continues to experience constipation and is maintaining her current medications, including Linzess , Bentyl , and Protonix . Her previous colonoscopy attempts were suboptimal, with the last successful one being several years ago.  She is engaged in physical therapy following a referral to sports medicine due to pain experienced during her last visit. She notes improvement in her condition since starting physical therapy. For hyperlipidemia, she is taking rosuvastatin  10 mg daily. She previously stopped the medication to assess if it was contributing to her pain, but found no change in symptoms and has since resumed it.  Her thyroid  condition is managed with levothyroxine  137 mcg, with recent lab results indicating no need for dosage adjustment.  She is on losartan  50 mg and furosemide  for blood pressure management, with readings typically around 121/60 to 130/79 mmHg. No significant side effects such as headache, dizziness, or chest pain, but mentions ongoing issues with her foot related to arthritis.  She has been taking meloxicam  for arthritis-related pain, which she finds beneficial for her mobility and muscle relaxation.  For diabetes  management, she is on Farxiga  5 mg and Rybelsus  7 mg, with blood glucose levels generally around 100-120 mg/dL. She monitors her blood sugar daily and notes occasional higher readings after dietary indulgences. Her last A1c was 5.9%.  She is addressing mental health concerns, including PTSD, with the help of a therapist named Silvano at [facility]. She reports significant improvement in her symptoms, particularly in response to past trauma. She engages in activities such as swimming and swinging to aid her musculoskeletal health and finds them beneficial for her shoulders.    Health Maintenance Due  Topic Date Due   OPHTHALMOLOGY EXAM  12/16/2023   HEMOGLOBIN A1C  12/22/2023   FOOT EXAM  12/30/2023    Past Medical History:  Diagnosis Date   Allergy    Arthritis    L hand, R ankle  (10/08/2016)   Bipolar 1 disorder (HCC)    Depression    Diabetes mellitus without complication (HCC)    Fibromyalgia    told that the breakout on the upper back , could be over active nerves , also treated with cream & Gabapentin - [facility] [place]    History of kidney stones    spontaneous passing   Hypertension    Hypothyroid    Pneumonia ~ 1967; 07/2016   Pyelonephritis 1997   treated with IV antibiotic     Past Surgical History:  Procedure Laterality Date   BIOPSY  11/04/2022   Procedure: BIOPSY;  Surgeon: Unk Corinn Skiff, MD;  Location: [facility] ENDOSCOPY;  Service: Gastroenterology;;   BLADDER INSTILLATION  X 2   CHALAZION EXCISION Bilateral    R-  eye X2 , L eye- X1   COLONOSCOPY     COLONOSCOPY WITH PROPOFOL  N/A 11/04/2022   Procedure: COLONOSCOPY WITH PROPOFOL ;  Surgeon: Unk Corinn Skiff, MD;  Location: [facility] ENDOSCOPY;  Service: Gastroenterology;  Laterality: N/A;  Patient does not want to be the first patient.  Prefers mid-morning   COLONOSCOPY WITH PROPOFOL  N/A 02/05/2023   Procedure: COLONOSCOPY WITH PROPOFOL ;  Surgeon: Unk Corinn Skiff, MD;  Location:  [facility] ENDOSCOPY;  Service: Gastroenterology;  Laterality: N/A;   ESOPHAGOGASTRODUODENOSCOPY (EGD) WITH PROPOFOL  N/A 11/04/2022   Procedure: ESOPHAGOGASTRODUODENOSCOPY (EGD) WITH PROPOFOL ;  Surgeon: Unk Corinn Skiff, MD;  Location: [facility] ENDOSCOPY;  Service: Gastroenterology;  Laterality: N/A;   FOOT ARTHRODESIS Right 10/08/2016   Procedure: RIGHT TRIPLE ARTHRODESIS;  Surgeon: Harden Jerona GAILS, MD;  Location: [facility] OR;  Service: Orthopedics;  Laterality: Right;   FOOT ARTHRODESIS, TRIPLE Right 10/08/2016   ankle   FRENULECTOMY, LINGUAL  1970s   LAPAROSCOPIC CHOLECYSTECTOMY  1996   PALATE SURGERY  1970s   tissue removed from upper palate as a child    URETHRAL DILATION       Current Outpatient Medications:    albuterol  (VENTOLIN  HFA) 108 (90 Base) MCG/ACT inhaler, Inhale 2 puffs into the lungs every 6 (six) hours as needed for wheezing or shortness of breath., Disp: 1 each, Rfl: 2   cetirizine (ZYRTEC) 5 MG tablet, 1 tablet Orally Once a day, Disp: , Rfl:    dicyclomine  (BENTYL ) 10 MG capsule, Take 1 capsule (10 mg total) by mouth 3 (three) times daily before meals., Disp: 90 capsule, Rfl: 11   docusate sodium  (COLACE) 100 MG capsule, Take 100 mg by mouth 3 (three) times daily., Disp: , Rfl:    Elastic Bandages & Supports (MEDICAL COMPRESSION SOCKS) MISC, Wear compression stockings daily with upright activity. Remove at bedtime., Disp: 2 each, Rfl: 0   ferrous sulfate 325 (65 FE) MG EC tablet, Take 325 mg by mouth., Disp: , Rfl:    furosemide  (LASIX ) 20 MG tablet, Take 1 tablet (20 mg total) by mouth daily., Disp: 90 tablet, Rfl: 3   gabapentin  (NEURONTIN ) 800 MG tablet, Take 1 tablet (800 mg total) by mouth 3 (three) times daily., Disp: 270 tablet, Rfl: 3   Lancets (ONETOUCH DELICA PLUS LANCET30G) MISC, Inject 1 Stick into the skin daily., Disp: 100 each, Rfl: 2   levothyroxine  (SYNTHROID ) 137 MCG tablet, Take 1 tablet (137 mcg total) by mouth daily before breakfast., Disp: 90 tablet, Rfl: 3    linaclotide  (LINZESS ) 290 MCG CAPS capsule, Take 1 capsule (290 mcg total) by mouth daily before breakfast., Disp: 90 capsule, Rfl: 3   lithium  carbonate 300 MG capsule, Take 3 capsules (900 mg total) by mouth daily., Disp: 90 capsule, Rfl: 3   losartan  (COZAAR ) 50 MG tablet, Take 1 tablet (50 mg total) by mouth daily., Disp: 90 tablet, Rfl: 3   meloxicam  (MOBIC ) 15 MG tablet, Take 1 tablet (15 mg total) by mouth daily., Disp: 30 tablet, Rfl: 0   mupirocin  ointment (BACTROBAN ) 2 %, Apply topical to aa qd, Disp: 22 g, Rfl: 3   ONETOUCH VERIO test strip, 1 each by Other route daily., Disp: 100 each, Rfl: 3   pantoprazole  (PROTONIX ) 40 MG tablet, Take 1 tablet (40 mg total) by mouth daily., Disp: 90 tablet, Rfl: 3   rosuvastatin  (CRESTOR ) 10 MG tablet, TAKE 1 TABLET BY MOUTH DAILY, Disp: 90 tablet, Rfl: 3   dapagliflozin  propanediol (FARXIGA ) 5 MG TABS tablet, Take 1 tablet (5 mg total)  by mouth daily., Disp: 90 tablet, Rfl: 3   Semaglutide  (RYBELSUS ) 7 MG TABS, Take 1 tablet (7 mg total) by mouth daily., Disp: 90 tablet, Rfl: 1  Allergies  Allergen Reactions   Eucalyptus Oil Shortness Of Breath    Other reaction(s): flush, cant breath   Haldol [Haloperidol Decanoate] Other (See Comments)    Drooling, weaving in floor, parkinson's syndrome (couldn't eat or talk) , pt was hospitalized    Molds & Smuts Shortness Of Breath and Itching   Other Anaphylaxis, Shortness Of Breath, Itching and Swelling    Bleu cheese    Penicillins Anaphylaxis and Rash    Pt was 63 years old  Has patient had a PCN reaction causing immediate rash, facial/tongue/throat swelling, SOB or lightheadedness with hypotension: Yes Has patient had a PCN reaction causing severe rash involving mucus membranes or skin necrosis: Unknown Has patient had a PCN reaction that required hospitalization: No Has patient had a PCN reaction occurring within the last 10 years: No If all of the above answers are NO, then may proceed with  Cephalosporin use.    Bee Venom Other (See Comments)    Yellow jackets and wasps-stung by 35, MD told pt that she wouldn't have resistance if stung again    Corn Oil     Other reaction(s): diarrhea, lots   Corn-Containing Products Nausea And Vomiting and Other (See Comments)    Stomach distress    Demeclocycline Other (See Comments)   Diflunisal Other (See Comments)    Upset stomach Other reaction(s): Unknown   Haloperidol Lactate     Other reaction(s): stumble around, droul   Hydrochlorothiazide      Other reaction(s): elevated lithium  level   Hydrocodone-Acetaminophen  Other (See Comments)    Other reaction(s): abd pain   Hydromorphone  Other (See Comments)   Imipramine     Other reaction(s): rash   Metformin     Other reaction(s): stomach upset   Metformin And Related    Nitrofurantoin     Other reaction(s): rash   Penicillin G Other (See Comments)    Other reaction(s): rash   Sulfamethoxazole-Trimethoprim     Other reaction(s): rash   Tetracycline Hcl     Other reaction(s): rash   Vicodin [Hydrocodone-Acetaminophen ] Diarrhea and Other (See Comments)    Upset stomach   Yellow Jacket Venom     Other Reaction(s): Abdominal Swelling   Cephalexin  Rash    Other reaction(s): diarrhea   Doxycycline Rash    Other reaction(s): rash   Erythromycin Rash    Other reaction(s): rash   Macrodantin Rash   Motrin [Ibuprofen] Palpitations   Septra [Bactrim] Rash   Tetracyclines & Related Rash   Tofranil-Pm Rash   ROS neg/noncontributory except as noted HPI/below      Objective:     BP 132/76 (BP Location: Left Arm, Patient Position: Sitting, Cuff Size: Large)   Pulse 64   Temp 98.4 F (36.9 C)   Ht 5' 8 (1.727 m)   Wt 235 lb (106.6 kg)   LMP 07/30/2013   SpO2 96%   BMI 35.73 kg/m  Wt Readings from Last 3 Encounters:  12/30/23 235 lb (106.6 kg)  11/25/23 234 lb (106.1 kg)  10/26/23 234 lb 8 oz (106.4 kg)    Physical Exam   Gen: WDWN NAD HEENT: NCAT, conjunctiva  not injected, sclera nonicteric NECK:  supple, no thyromegaly, no nodes, no carotid bruits CARDIAC: RRR, S1S2+, no murmur. DP 2+B LUNGS: CTAB. No wheezes ABDOMEN:  BS+, soft, NTND,  No HSM, no masses EXT:  + edema-wearing compression stockings MSK: no gross abnormalities.  NEURO: A&O x3.  CN II-XII intact.  PSYCH: normal mood. Good eye contact      Assessment & Plan:  Type 2 diabetes mellitus without complication, without long-term current use of insulin  (HCC) -     Hemoglobin A1c -     Dapagliflozin  Propanediol; Take 1 tablet (5 mg total) by mouth daily.  Dispense: 90 tablet; Refill: 3 -     Rybelsus ; Take 1 tablet (7 mg total) by mouth daily.  Dispense: 90 tablet; Refill: 1  Benign essential hypertension  Hyperlipidemia associated with type 2 diabetes mellitus (HCC)  Iron deficiency anemia due to chronic blood loss -     CBC with Differential/Platelet -     IBC + Ferritin -     Vitamin B12  Long term current use of oral hypoglycemic drug    Assessment and Plan Assessment & Plan Constipation   Chronic constipation persists despite previous interventions. A recent colonoscopy on November 02, 2023, was successful with no polyps. Continue current medications including Linzess , Bentyl , and Protonix . Ensure prescription refills are managed.  Type 2 diabetes mellitus   Type 2 diabetes mellitus is managed with Farxiga  and Rybelsus . Blood glucose levels are generally well-controlled, with occasional elevations such as 141 mg/dL after a celebratory event. The last A1c was 5.9%. Continue Farxiga  5 mg and Rybelsus  7 mg. Check A1c as it is overdue. Consider increasing Rybelsus  dose if needed for better glucose control and weight management.  Hypertension   Hypertension is well-controlled with losartan  and furosemide . Blood pressure readings are generally within target range, such as 121/60 mmHg and 130/79 mmHg. Continue losartan  50 mg and furosemide . Monitor blood pressure  regularly.  Hyperlipidemia   Hyperlipidemia is managed with rosuvastatin  10 mg daily. Recent testing confirmed cholesterol levels are within target range. A previous trial off medication confirmed it was not the cause of pain. Continue rosuvastatin  10 mg daily.  Hypothyroidism   Hypothyroidism is managed with levothyroxine  137 mcg. Recent lab results indicate thyroid  function is well-managed with no need for dosage adjustment. Continue levothyroxine  137 mcg.  Osteoarthritis   Osteoarthritis symptoms are managed by meloxicam , providing significant relief in joint and muscle pain. Plans to discuss dosing with Dr. Leonce on January 06, 2024. She has six pills left and may consider taking half a tablet to extend supply until the next appointment. Consider taking half a tablet of meloxicam  to extend supply until the next appointment. Discuss long-term management plan with Dr. Leonce.  Chronic low back pain with lumbar degenerative disc disease   Chronic low back pain is associated with lumbar degenerative disc disease. Recent imaging showed a trophic disc at L4/L5. Symptoms are managed with physical therapy and meloxicam . Continue physical therapy. Discuss meloxicam  dosing with Dr. Leonce.  Post-traumatic stress disorder (PTSD)   PTSD symptoms are being managed with therapy at [facility]. Significant improvement is noted with current therapy. Continue therapy with Silvano at [facility].  Iron deficiency anemia   Iron deficiency anemia is previously managed with iron supplementation. The current regimen includes two iron pills per week. Hemoglobin levels have fluctuated, necessitating monitoring. Monitor hemoglobin levels. Adjust iron supplementation as needed.     Return in about 6 months (around 06/29/2024) for chronic follow-up.  Jenkins CHRISTELLA Carrel, MD

## 2023-12-30 NOTE — Progress Notes (Signed)
 Iron stores better, but should do the iron every other day still Sugars-great!

## 2023-12-30 NOTE — Patient Instructions (Signed)

## 2024-01-03 NOTE — Therapy (Signed)
 OUTPATIENT PHYSICAL THERAPY LOWER EXTREMITY EVALUATION   Patient Name: Leah Powers MRN: [id number] DOB:[birth year], 63 y.o., female Today's Date: 01/05/2024  END OF SESSION:  PT End of Session - 01/05/24 1628     Visit Number 2    Number of Visits 7    Date for Recertification  03/18/24    Authorization Type [place] MEDICAID [employer]    Authorization - Visit Number 2    Authorization - Number of Visits 27    PT Start Time 1545    PT Stop Time 1630    PT Time Calculation (min) 45 min    Activity Tolerance Patient tolerated treatment well    Behavior During Therapy WFL for tasks assessed/performed           Past Medical History:  Diagnosis Date   Allergy    Arthritis    L hand, R ankle  (10/08/2016)   Bipolar 1 disorder (HCC)    Depression    Diabetes mellitus without complication (HCC)    Fibromyalgia    told that the breakout on the upper back , could be over active nerves , also treated with cream & Gabapentin - [facility] [place]    History of kidney stones    spontaneous passing   Hypertension    Hypothyroid    Pneumonia ~ 1967; 07/2016   Pyelonephritis 1997   treated with IV antibiotic    Past Surgical History:  Procedure Laterality Date   BIOPSY  11/04/2022   Procedure: BIOPSY;  Surgeon: Unk Corinn Skiff, MD;  Location: [facility] ENDOSCOPY;  Service: Gastroenterology;;   BLADDER INSTILLATION  X 2   CHALAZION EXCISION Bilateral    R- eye X2 , L eye- X1   COLONOSCOPY     COLONOSCOPY WITH PROPOFOL  N/A 11/04/2022   Procedure: COLONOSCOPY WITH PROPOFOL ;  Surgeon: Unk Corinn Skiff, MD;  Location: [facility] ENDOSCOPY;  Service: Gastroenterology;  Laterality: N/A;  Patient does not want to be the first patient.  Prefers mid-morning   COLONOSCOPY WITH PROPOFOL  N/A 02/05/2023   Procedure: COLONOSCOPY WITH PROPOFOL ;  Surgeon: Unk Corinn Skiff, MD;  Location: [facility] ENDOSCOPY;  Service: Gastroenterology;  Laterality: N/A;    ESOPHAGOGASTRODUODENOSCOPY (EGD) WITH PROPOFOL  N/A 11/04/2022   Procedure: ESOPHAGOGASTRODUODENOSCOPY (EGD) WITH PROPOFOL ;  Surgeon: Unk Corinn Skiff, MD;  Location: [facility] ENDOSCOPY;  Service: Gastroenterology;  Laterality: N/A;   FOOT ARTHRODESIS Right 10/08/2016   Procedure: RIGHT TRIPLE ARTHRODESIS;  Surgeon: Harden Jerona GAILS, MD;  Location: [facility] OR;  Service: Orthopedics;  Laterality: Right;   FOOT ARTHRODESIS, TRIPLE Right 10/08/2016   ankle   FRENULECTOMY, LINGUAL  1970s   LAPAROSCOPIC CHOLECYSTECTOMY  1996   PALATE SURGERY  1970s   tissue removed from upper palate as a child    URETHRAL DILATION     Patient Active Problem List   Diagnosis Date Noted   Benign essential hypertension 08/27/2023   Class 2 obesity 08/27/2023   Decreased estrogen level 08/27/2023   Morbid obesity (HCC) 08/27/2023   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   Hyperlipidemia associated with type 2 diabetes mellitus (HCC) 04/27/2023   Absolute anemia 12/30/2022   History of colon polyps 11/04/2022   Nausea and vomiting 11/04/2022   Gastritis 11/04/2022   Local edema 07/30/2022   Mild intermittent asthma without complication 07/30/2022   Vitamin D  deficiency 07/30/2022   Snoring 01/24/2019   AKI (acute kidney injury) 05/20/2018   Lithium  toxicity 05/20/2018   Type 2 diabetes mellitus without complication, without long-term current use of insulin  (  HCC) 05/20/2018   Bipolar I disorder (HCC) 02/04/2018   S/P ankle fusion 11/12/2016   Asymmetric SNHL (sensorineural hearing loss) 11/11/2016   Posterior tibial tendon dysfunction (PTTD) of right lower extremity 10/08/2016   Neurodermatitis 07/30/2015   HTN (hypertension) 05/31/2014   Hypothyroidism 05/31/2014    PCP: Wendolyn Jenkins Jansky, MD   REFERRING PROVIDER: Leonce Katz, DO   REFERRING DIAG:  M54.42,M54.41,G89.29 (ICD-10-CM) - Chronic bilateral low back pain with bilateral sciatica  M51.362 (ICD-10-CM) - Degeneration of intervertebral disc of lumbar  region with discogenic back pain and lower extremity pain  R07.81 (ICD-10-CM) - Rib pain    THERAPY DIAG:  Other low back pain  Muscle weakness (generalized)  Rationale for Evaluation and Treatment: Rehabilitation  ONSET DATE: 4 months ago  SUBJECTIVE:   SUBJECTIVE STATEMENT: Pt states she has been completing her exs as able. Her low back and rib pain is improved, and she is taking 1/2 tab of meloxicam .   PERTINENT HISTORY: High BMI, as above  PAIN:  Are you having pain? Yes: NPRS scale: 0/10 Pain location: low back, L>R, and ribs Pain description: throb Aggravating factors: Prolonged standing, 5-10 mins Relieving factors: Sit, rest  PRECAUTIONS: None  RED FLAGS: None   WEIGHT BEARING RESTRICTIONS: No  FALLS:  Has patient fallen in last 6 months? No  LIVING ENVIRONMENT: Lives with: lives with their family Lives in: House/apartment Able to access home  OCCUPATION: retired  PLOF: Independent  PATIENT GOALS: To live more comfortably, be a little more active, better QOL   NEXT MD VISIT: 01/06/24  OBJECTIVE:  Note: Objective measures were completed at Evaluation unless otherwise noted.  DIAGNOSTIC FINDINGS:  12/02/23 IMPRESSION: Negative radiographs of the ribs.  IMPRESSION: 1. Multilevel degenerative disc disease and facet hypertrophy, most prominent at L4-L5 and L5-S1. 2. Grade 1/2 anterolisthesis of L4 on L5.  IMPRESSION: Mild bilateral hip osteoarthritis.  PATIENT SURVEYS:  Modified Oswestry: 17/50= 34%  Minimally Clinically Important Difference (MCID) = 12.8%  COGNITION: Overall cognitive status: Within functional limits for tasks assessed     SENSATION: WFL  EDEMA:  NA  MUSCLE LENGTH: Hamstrings: Right WNLs deg; Left WNLs deg Debby test: Right Tight deg; Left Tight deg  POSTURE: rounded shoulders, forward head, and increased lumbar lordosisgenu valgum, pes planus  PALPATION: TTP on the R QL and upper gluteal muscles  LUMBAR  ROM:   AROM eval  Flexion Full, lordosis  Extension Mod limited  Right lateral flexion Min limited  Left lateral flexion Min limited  Right rotation Full  Left rotation Full, L thoracic pain   (Blank rows = not tested)  LOWER EXTREMITY ROM:     WNLs Active  Right eval Left eval  Hip flexion    Hip extension    Hip abduction    Hip adduction    Hip internal rotation    Hip external rotation    Knee flexion    Knee extension    Ankle dorsiflexion    Ankle plantarflexion    Ankle inversion    Ankle eversion     (Blank rows = not tested)  LOWER EXTREMITY MMT:    Myotome screen negative MMT Right eval Left eval  Hip flexion 4 4  Hip extension    Hip abduction 3+ 3+  Hip adduction    Hip internal rotation    Hip external rotation 3+ 3+  Knee flexion    Knee extension    Ankle dorsiflexion    Ankle plantarflexion  Ankle inversion    Ankle eversion     (Blank rows = not tested)  LUMBAR SPECIAL TESTS:  Straight leg raise test: Negative and Slump test: Negative Weak core  FUNCTIONAL TESTS:  5 times sit to stand: TBA  GAIT: Distance walked: 200' Assistive device utilized: None Level of assistance: Complete Independence Comments: WNLs                                                                                                          TREATMENT DATE:  [facility] Adult PT Treatment:                                                DATE: 01/05/24 Therapeutic Exercise/Activity: 5xSTS= 11.8 sec Hamstring Stretch in sitting  x2 30 Seated Flexion Stretch with chair  x10 5-20 Supine Lower Trunk Rotation  x5 5 Child's Pose Stretch x5 5 Supine Piriformis Stretch x2  30 Supine Posterior Pelvic Tilt  x15 3 Supine Bridge with Resistance Band  x15 3' Sidelying Hip Abduction  x15 3 S/L Clamshell with Resistance  x15 3  [facility] Adult PT Treatment:                                                DATE: 12/18/23 Therapeutic Exercise: Developed, instructed in, and pt  completed therex as noted in HEP    PATIENT EDUCATION:  Education details: Eval findings, POC, HEP, self care  Person educated: Patient Education method: Explanation, Demonstration, Tactile cues, Verbal cues, and Handouts Education comprehension: verbalized understanding, returned demonstration, verbal cues required, and tactile cues required  HOME EXERCISE PROGRAM: Access Code: [email or web address] URL: [email or web address] Date: 01/05/2024 Prepared by: Dasie Daft  Exercises - Seated Hamstring Stretch  - 1 x daily - 7 x weekly - 1 sets - 3 reps - 30 hold - Seated Flexion Stretch with Swiss Ball  - 1 x daily - 7 x weekly - 1 sets - 5 reps - 5-20 hold - Prone Quadriceps Stretch with Strap  - 1 x daily - 7 x weekly - 1 sets - 3 reps - 30 hold - Child's Pose Stretch  - 1 x daily - 7 x weekly - 1 sets - 5 reps - 20 hold - Supine Lower Trunk Rotation  - 5 x daily - 7 x weekly - 1 sets - 5 reps - 5 hold - Supine Piriformis Stretch with Foot on Ground  - 1 x daily - 7 x weekly - 1 sets - 3 reps - 30 hold - Supine Posterior Pelvic Tilt  - 1 x daily - 7 x weekly - 3 sets - 10 reps - 3 hold - Supine Bridge with Resistance Band  - 1 x daily - 7 x weekly - 2 sets - 10 reps - 3 hold -  Sidelying Hip Abduction  - 1 x daily - 7 x weekly - 2 sets - 10 reps - 3 hold - Clamshell with Resistance  - 1 x daily - 7 x weekly - 2 sets - 10 reps - 3 hold  ASSESSMENT:  CLINICAL IMPRESSION: Pt reports her low back and rib pain has been low. She notes she is taking 1/2 a tab of meloxicam . PT was completed for lumbopelvic flexibility and strengthening exs and reviewing the pt's HEP. Skilled PT was provided for proper completion and assessment of tolerance. Pt tolerated PT today without adverse effects. Pt will continue to benefit from skilled PT to address impairments for improved function.    EVAL: Patient is a 63 y.o. female who was seen today for physical therapy evaluation and treatment for   M54.42,M54.41,G89.29 (ICD-10-CM) - Chronic bilateral low back pain with bilateral sciatica  M51.362 (ICD-10-CM) - Degeneration of intervertebral disc of lumbar region with discogenic back pain and lower extremity pain  R07.81 (ICD-10-CM) - Rib pain  Pt presents with signs and symptoms consistent with her referral and imaging. Pt's demonstrates weak hips and core and postural deficits. A HEP was started to address deficits. Pt will benefit from skilled PT to address impairments to optimize back function with less pain.   OBJECTIVE IMPAIRMENTS: decreased activity tolerance, decreased strength, impaired flexibility, postural dysfunction, pain, and High BMI.   ACTIVITY LIMITATIONS: carrying, lifting, standing, and squatting  PARTICIPATION LIMITATIONS: meal prep, cleaning, laundry, shopping, and community activity  PERSONAL FACTORS: Fitness, Past/current experiences, Time since onset of injury/illness/exacerbation, and 1 comorbidity: high BMI are also affecting patient's functional outcome.   REHAB POTENTIAL: Good  CLINICAL DECISION MAKING: Evolving/moderate complexity  EVALUATION COMPLEXITY: Moderate   GOALS:  SHORT TERM GOALS: Target date: 12/18/23 Pt will be Ind in an initial HEP  Baseline:started Goal status: ONGOING  2.  Pt will report 25% or greater improvement in her back and rib pain for improved function and  QOL Baseline: 6/10 Goal status: ONGOING  LONG TERM GOALS: Target date: 03/18/24  Pt will be Ind in a final HEP to maintain achieved LOF  Baseline:  Goal status: INITIAL  2.   Pt will report 75% or greater improvement in her back and rib pain for improved function and  QOL Baseline: 6/10 Goal status: INITIAL  3.  Increase bilat hip strength to 4/5 or greater for improved lumbopelvic stability Baseline: see flow sheet Goal status: INITIAL  4.  Improve 5xSTS by MCID as indication of improved functional mobility. Revised- improve by 3  with pt completing at a near  norm level.  Baseline: 11.8 Goal status: INITIAL  5.  Pt's mod Oswestry score will improved to 21% as indication of improved function  Baseline: 34% Goal status: INITIAL  6.  Pt will demonstrate improved body mechanics for household activities to minimize back strain Baseline:  Goal status: INITIAL   PLAN:  PT FREQUENCY: 1x per 2 weeks  PT DURATION: 12 weeks  PLANNED INTERVENTIONS: 97164- PT Re-evaluation, 97110-Therapeutic exercises, 97530- Therapeutic activity, 97112- Neuromuscular re-education, 97535- Self Care, 02859- Manual therapy, [id number]- Aquatic Therapy, H9716- Electrical stimulation (unattended), 20560 (1-2 muscles), 20561 (3+ muscles)- Dry Needling, Patient/Family education, Taping, Joint mobilization, Cryotherapy, and Moist heat  PLAN FOR NEXT SESSION: Assess response to HEP; progress therex as indicated; use of modalities, manual therapy; and TPDN as indicated.    Quisha Mabie MS, PT 01/05/24 4:56 PM

## 2024-01-05 ENCOUNTER — Ambulatory Visit: Admitting: Professional Counselor

## 2024-01-05 ENCOUNTER — Ambulatory Visit

## 2024-01-05 DIAGNOSIS — M6281 Muscle weakness (generalized): Secondary | ICD-10-CM

## 2024-01-05 DIAGNOSIS — M5459 Other low back pain: Secondary | ICD-10-CM | POA: Diagnosis not present

## 2024-01-05 NOTE — Progress Notes (Unsigned)
 Ben Jackson D.[facility] [address] [place] [address] Phone: [phone]   Assessment and Plan:     1. Chronic bilateral low back pain with bilateral sciatica (Primary) 2. Degeneration of intervertebral disc of lumbar region with discogenic back pain and lower extremity pain -Chronic with exacerbation, subsequent visit - Overall significant improvement in chronic musculoskeletal pain after completing course of meloxicam , starting HEP and physical therapy.  Patient's symptoms returned after completing meloxicam  course, but have been well-controlled with as needed meloxicam .  Still most consistent with lumbar degenerative changes leading to lumbar radiculopathy - Use meloxicam  7.5 mg daily as needed for breakthrough pain.  Recommend limiting chronic NSAIDs to 1-2 doses per week to prevent long-term side effects. Use Tylenol  500 to 1000 mg tablets 2-3 times a day as needed for day-to-day pain relief.   Refill provided -Continue HEP and physical therapy    Pertinent previous records reviewed include none   Follow Up: 8 weeks for reevaluation.  If conservative treatment is effective, we will continue current plan.  If not effective, would obtain lumbar MRI   Subjective:   I, Raynard Mapps, am serving as a Neurosurgeon for Doctor Morene Mace   Chief Complaint: multiple musculoskeletal pain   HPI:    11/25/2023 Patient is a 63 year old female with multiple complaints. Patient states late spring she started getting pain flares in her ribs down to the abdomin. Ice and topical creams don't help. Decreased ROM. Pain goes to the glutes. She is TTP throughout the upper back. She uses a cane in the am 4 pronged. States she can't take deep breathes. Sleeps on the floor or recliner when she is flared. States she had a fall in 1986 that did not heal properly. She isn't able to sleep through the night when flared. The worst of the pain has stopped since  last month. Naproxen  does help when she is flared.    01/06/2024 Patient states once she stopped meloxicam  pain came back.But, she is doing much better. She has been taking half for the past 4 days and that has helped     Relevant Historical Information: Hypertension, DM type II, hypothyroidism, neurodermatitis  Additional pertinent review of systems negative.   Current Outpatient Medications:    albuterol  (VENTOLIN  HFA) 108 (90 Base) MCG/ACT inhaler, Inhale 2 puffs into the lungs every 6 (six) hours as needed for wheezing or shortness of breath., Disp: 1 each, Rfl: 2   cetirizine (ZYRTEC) 5 MG tablet, 1 tablet Orally Once a day, Disp: , Rfl:    dapagliflozin  propanediol (FARXIGA ) 5 MG TABS tablet, Take 1 tablet (5 mg total) by mouth daily., Disp: 90 tablet, Rfl: 3   dicyclomine  (BENTYL ) 10 MG capsule, Take 1 capsule (10 mg total) by mouth 3 (three) times daily before meals., Disp: 90 capsule, Rfl: 11   docusate sodium  (COLACE) 100 MG capsule, Take 100 mg by mouth 3 (three) times daily., Disp: , Rfl:    Elastic Bandages & Supports (MEDICAL COMPRESSION SOCKS) MISC, Wear compression stockings daily with upright activity. Remove at bedtime., Disp: 2 each, Rfl: 0   ferrous sulfate 325 (65 FE) MG EC tablet, Take 325 mg by mouth., Disp: , Rfl:    furosemide  (LASIX ) 20 MG tablet, Take 1 tablet (20 mg total) by mouth daily., Disp: 90 tablet, Rfl: 3   gabapentin  (NEURONTIN ) 800 MG tablet, Take 1 tablet (800 mg total) by mouth 3 (three) times daily., Disp: 270 tablet, Rfl:  3   Lancets (ONETOUCH DELICA PLUS LANCET30G) MISC, Inject 1 Stick into the skin daily., Disp: 100 each, Rfl: 2   levothyroxine  (SYNTHROID ) 137 MCG tablet, Take 1 tablet (137 mcg total) by mouth daily before breakfast., Disp: 90 tablet, Rfl: 3   linaclotide  (LINZESS ) 290 MCG CAPS capsule, Take 1 capsule (290 mcg total) by mouth daily before breakfast., Disp: 90 capsule, Rfl: 3   lithium  carbonate 300 MG capsule, Take 3 capsules (900  mg total) by mouth daily., Disp: 90 capsule, Rfl: 3   losartan  (COZAAR ) 50 MG tablet, Take 1 tablet (50 mg total) by mouth daily., Disp: 90 tablet, Rfl: 3   meloxicam  (MOBIC ) 15 MG tablet, Take 1 tablet (15 mg total) by mouth daily., Disp: 30 tablet, Rfl: 0   mupirocin  ointment (BACTROBAN ) 2 %, Apply topical to aa qd, Disp: 22 g, Rfl: 3   ONETOUCH VERIO test strip, 1 each by Other route daily., Disp: 100 each, Rfl: 3   pantoprazole  (PROTONIX ) 40 MG tablet, Take 1 tablet (40 mg total) by mouth daily., Disp: 90 tablet, Rfl: 3   rosuvastatin  (CRESTOR ) 10 MG tablet, TAKE 1 TABLET BY MOUTH DAILY, Disp: 90 tablet, Rfl: 3   Semaglutide  (RYBELSUS ) 7 MG TABS, Take 1 tablet (7 mg total) by mouth daily., Disp: 90 tablet, Rfl: 1   Objective:     Vitals:   01/06/24 1346  Pulse: 75  SpO2: 96%  Weight: 235 lb (106.6 kg)  Height: 5' 8 (1.727 m)      Body mass index is 35.73 kg/m.    Physical Exam:    Gen: Appears well, nad, nontoxic and pleasant Psych: Alert and oriented, appropriate mood and affect Neuro: sensation intact, strength is 5/5 in upper and lower extremities, muscle tone wnl Skin: no susupicious lesions or rashes   Back - Normal skin, Spine with normal alignment and no deformity.   No tenderness to vertebral process palpation.   Lumbar paraspinous muscles are   tender and without spasm  TTP gluteal musculature bilaterally, worse on left Straight leg raise negative Piriformis Test negative Gait normal     Electronically signed by:  Odis Mace D.[facility] 2:04 PM 01/06/24

## 2024-01-06 ENCOUNTER — Ambulatory Visit: Admitting: Sports Medicine

## 2024-01-06 VITALS — HR 75 | Ht 68.0 in | Wt 235.0 lb

## 2024-01-06 DIAGNOSIS — G8929 Other chronic pain: Secondary | ICD-10-CM | POA: Diagnosis not present

## 2024-01-06 DIAGNOSIS — M51362 Other intervertebral disc degeneration, lumbar region with discogenic back pain and lower extremity pain: Secondary | ICD-10-CM | POA: Diagnosis not present

## 2024-01-06 MED ORDER — MELOXICAM 7.5 MG PO TABS: 7.5000 mg | ORAL_TABLET | Freq: Every day | ORAL | 0 refills | Status: DC | PRN

## 2024-01-06 NOTE — Patient Instructions (Addendum)
-   Use meloxicam  7.5 mg daily as needed for breakthrough pain.  Recommend limiting chronic NSAIDs to 1-2 doses per week to prevent long-term side effects. Use Tylenol  500 to 1000 mg tablets 2-3 times a day as needed for day-to-day pain relief.  Meloxicam  refill     Continue HEP and PT   8 week follow up

## 2024-01-11 NOTE — Therapy (Incomplete)
 OUTPATIENT PHYSICAL THERAPY LOWER EXTREMITY EVALUATION   Patient Name: Leah Powers MRN: [id number] DOB:[birth year], 63 y.o., female Today's Date: 01/11/2024  END OF SESSION:     Past Medical History:  Diagnosis Date   Allergy    Arthritis    L hand, R ankle  (10/08/2016)   Bipolar 1 disorder (HCC)    Depression    Diabetes mellitus without complication (HCC)    Fibromyalgia    told that the breakout on the upper back , could be over active nerves , also treated with cream & Gabapentin - [facility] [place]    History of kidney stones    spontaneous passing   Hypertension    Hypothyroid    Pneumonia ~ 1967; 07/2016   Pyelonephritis 1997   treated with IV antibiotic    Past Surgical History:  Procedure Laterality Date   BIOPSY  11/04/2022   Procedure: BIOPSY;  Surgeon: Unk Corinn Skiff, MD;  Location: [facility] ENDOSCOPY;  Service: Gastroenterology;;   BLADDER INSTILLATION  X 2   CHALAZION EXCISION Bilateral    R- eye X2 , L eye- X1   COLONOSCOPY     COLONOSCOPY WITH PROPOFOL  N/A 11/04/2022   Procedure: COLONOSCOPY WITH PROPOFOL ;  Surgeon: Unk Corinn Skiff, MD;  Location: [facility] ENDOSCOPY;  Service: Gastroenterology;  Laterality: N/A;  Patient does not want to be the first patient.  Prefers mid-morning   COLONOSCOPY WITH PROPOFOL  N/A 02/05/2023   Procedure: COLONOSCOPY WITH PROPOFOL ;  Surgeon: Unk Corinn Skiff, MD;  Location: [facility] ENDOSCOPY;  Service: Gastroenterology;  Laterality: N/A;   ESOPHAGOGASTRODUODENOSCOPY (EGD) WITH PROPOFOL  N/A 11/04/2022   Procedure: ESOPHAGOGASTRODUODENOSCOPY (EGD) WITH PROPOFOL ;  Surgeon: Unk Corinn Skiff, MD;  Location: [facility] ENDOSCOPY;  Service: Gastroenterology;  Laterality: N/A;   FOOT ARTHRODESIS Right 10/08/2016   Procedure: RIGHT TRIPLE ARTHRODESIS;  Surgeon: Harden Jerona GAILS, MD;  Location: [facility] OR;  Service: Orthopedics;  Laterality: Right;   FOOT ARTHRODESIS, TRIPLE Right 10/08/2016   ankle   FRENULECTOMY, LINGUAL  1970s    LAPAROSCOPIC CHOLECYSTECTOMY  1996   PALATE SURGERY  1970s   tissue removed from upper palate as a child    URETHRAL DILATION     Patient Active Problem List   Diagnosis Date Noted   Benign essential hypertension 08/27/2023   Class 2 obesity 08/27/2023   Decreased estrogen level 08/27/2023   Morbid obesity (HCC) 08/27/2023   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   Hyperlipidemia associated with type 2 diabetes mellitus (HCC) 04/27/2023   Absolute anemia 12/30/2022   History of colon polyps 11/04/2022   Nausea and vomiting 11/04/2022   Gastritis 11/04/2022   Local edema 07/30/2022   Mild intermittent asthma without complication 07/30/2022   Vitamin D  deficiency 07/30/2022   Snoring 01/24/2019   AKI (acute kidney injury) 05/20/2018   Lithium  toxicity 05/20/2018   Type 2 diabetes mellitus without complication, without long-term current use of insulin  (HCC) 05/20/2018   Bipolar I disorder (HCC) 02/04/2018   S/P ankle fusion 11/12/2016   Asymmetric SNHL (sensorineural hearing loss) 11/11/2016   Posterior tibial tendon dysfunction (PTTD) of right lower extremity 10/08/2016   Neurodermatitis 07/30/2015   HTN (hypertension) 05/31/2014   Hypothyroidism 05/31/2014    PCP: Wendolyn Jenkins Jansky, MD   REFERRING PROVIDER: Leonce Katz, DO   REFERRING DIAG:  M54.42,M54.41,G89.29 (ICD-10-CM) - Chronic bilateral low back pain with bilateral sciatica  M51.362 (ICD-10-CM) - Degeneration of intervertebral disc of lumbar region with discogenic back pain and lower extremity pain  R07.81 (ICD-10-CM) - Rib pain  THERAPY DIAG:  No diagnosis found.  Rationale for Evaluation and Treatment: Rehabilitation  ONSET DATE: 4 months ago  SUBJECTIVE:   SUBJECTIVE STATEMENT: Pt states she has been completing her exs as able. Her low back and rib pain is improved, and she is taking 1/2 tab of meloxicam .   PERTINENT HISTORY: High BMI, as above  PAIN:  Are you having pain? Yes: NPRS scale:  0/10 Pain location: low back, L>R, and ribs Pain description: throb Aggravating factors: Prolonged standing, 5-10 mins Relieving factors: Sit, rest  PRECAUTIONS: None  RED FLAGS: None   WEIGHT BEARING RESTRICTIONS: No  FALLS:  Has patient fallen in last 6 months? No  LIVING ENVIRONMENT: Lives with: lives with their family Lives in: House/apartment Able to access home  OCCUPATION: retired  PLOF: Independent  PATIENT GOALS: To live more comfortably, be a little more active, better QOL   NEXT MD VISIT: 01/06/24  OBJECTIVE:  Note: Objective measures were completed at Evaluation unless otherwise noted.  DIAGNOSTIC FINDINGS:  12/02/23 IMPRESSION: Negative radiographs of the ribs.  IMPRESSION: 1. Multilevel degenerative disc disease and facet hypertrophy, most prominent at L4-L5 and L5-S1. 2. Grade 1/2 anterolisthesis of L4 on L5.  IMPRESSION: Mild bilateral hip osteoarthritis.  PATIENT SURVEYS:  Modified Oswestry: 17/50= 34%  Minimally Clinically Important Difference (MCID) = 12.8%  COGNITION: Overall cognitive status: Within functional limits for tasks assessed     SENSATION: WFL  EDEMA:  NA  MUSCLE LENGTH: Hamstrings: Right WNLs deg; Left WNLs deg Debby test: Right Tight deg; Left Tight deg  POSTURE: rounded shoulders, forward head, and increased lumbar lordosisgenu valgum, pes planus  PALPATION: TTP on the R QL and upper gluteal muscles  LUMBAR ROM:   AROM eval  Flexion Full, lordosis  Extension Mod limited  Right lateral flexion Min limited  Left lateral flexion Min limited  Right rotation Full  Left rotation Full, L thoracic pain   (Blank rows = not tested)  LOWER EXTREMITY ROM:     WNLs Active  Right eval Left eval  Hip flexion    Hip extension    Hip abduction    Hip adduction    Hip internal rotation    Hip external rotation    Knee flexion    Knee extension    Ankle dorsiflexion    Ankle plantarflexion    Ankle inversion     Ankle eversion     (Blank rows = not tested)  LOWER EXTREMITY MMT:    Myotome screen negative MMT Right eval Left eval  Hip flexion 4 4  Hip extension    Hip abduction 3+ 3+  Hip adduction    Hip internal rotation    Hip external rotation 3+ 3+  Knee flexion    Knee extension    Ankle dorsiflexion    Ankle plantarflexion    Ankle inversion    Ankle eversion     (Blank rows = not tested)  LUMBAR SPECIAL TESTS:  Straight leg raise test: Negative and Slump test: Negative Weak core  FUNCTIONAL TESTS:  5 times sit to stand: TBA  GAIT: Distance walked: 200' Assistive device utilized: None Level of assistance: Complete Independence Comments: WNLs  TREATMENT DATE:  [facility] Adult PT Treatment:                                                DATE: 01/12/24 Therapeutic Exercise/Activity: 5xSTS= 11.8 sec Hamstring Stretch in sitting  x2 30 Seated Flexion Stretch with chair  x10 5-20 Supine Lower Trunk Rotation  x5 5 Child's Pose Stretch x5 5 Supine Piriformis Stretch x2  30 Supine Posterior Pelvic Tilt  x15 3 Supine Bridge with Resistance Band  x15 3' Sidelying Hip Abduction  x15 3 S/L Clamshell with Resistance  x15 3 Therapeutic Exercise: *** Manual Therapy: *** Neuromuscular re-ed: *** Therapeutic Activity: *** Modalities: *** Self Care: ***   [facility] Adult PT Treatment:                                                DATE: 01/05/24 Therapeutic Exercise/Activity: 5xSTS= 11.8 sec Hamstring Stretch in sitting  x2 30 Seated Flexion Stretch with chair  x10 5-20 Supine Lower Trunk Rotation  x5 5 Child's Pose Stretch x5 5 Supine Piriformis Stretch x2  30 Supine Posterior Pelvic Tilt  x15 3 Supine Bridge with Resistance Band  x15 3' Sidelying Hip Abduction  x15 3 S/L Clamshell with Resistance  x15 3  [facility] Adult PT Treatment:                                                 DATE: 12/18/23 Therapeutic Exercise: Developed, instructed in, and pt completed therex as noted in HEP    PATIENT EDUCATION:  Education details: Eval findings, POC, HEP, self care  Person educated: Patient Education method: Explanation, Demonstration, Tactile cues, Verbal cues, and Handouts Education comprehension: verbalized understanding, returned demonstration, verbal cues required, and tactile cues required  HOME EXERCISE PROGRAM: Access Code: [email or web address] URL: [email or web address] Date: 01/05/2024 Prepared by: Dasie Daft  Exercises - Seated Hamstring Stretch  - 1 x daily - 7 x weekly - 1 sets - 3 reps - 30 hold - Seated Flexion Stretch with Swiss Ball  - 1 x daily - 7 x weekly - 1 sets - 5 reps - 5-20 hold - Prone Quadriceps Stretch with Strap  - 1 x daily - 7 x weekly - 1 sets - 3 reps - 30 hold - Child's Pose Stretch  - 1 x daily - 7 x weekly - 1 sets - 5 reps - 20 hold - Supine Lower Trunk Rotation  - 5 x daily - 7 x weekly - 1 sets - 5 reps - 5 hold - Supine Piriformis Stretch with Foot on Ground  - 1 x daily - 7 x weekly - 1 sets - 3 reps - 30 hold - Supine Posterior Pelvic Tilt  - 1 x daily - 7 x weekly - 3 sets - 10 reps - 3 hold - Supine Bridge with Resistance Band  - 1 x daily - 7 x weekly - 2 sets - 10 reps - 3 hold - Sidelying Hip Abduction  - 1 x daily - 7 x weekly - 2 sets - 10 reps - 3 hold - Clamshell with  Resistance  - 1 x daily - 7 x weekly - 2 sets - 10 reps - 3 hold  ASSESSMENT:  CLINICAL IMPRESSION: Pt reports her low back and rib pain has been low. She notes she is taking 1/2 a tab of meloxicam . PT was completed for lumbopelvic flexibility and strengthening exs and reviewing the pt's HEP. Skilled PT was provided for proper completion and assessment of tolerance. Pt tolerated PT today without adverse effects. Pt will continue to benefit from skilled PT to address impairments for improved function.    EVAL: Patient is a 63  y.o. female who was seen today for physical therapy evaluation and treatment for  M54.42,M54.41,G89.29 (ICD-10-CM) - Chronic bilateral low back pain with bilateral sciatica  M51.362 (ICD-10-CM) - Degeneration of intervertebral disc of lumbar region with discogenic back pain and lower extremity pain  R07.81 (ICD-10-CM) - Rib pain  Pt presents with signs and symptoms consistent with her referral and imaging. Pt's demonstrates weak hips and core and postural deficits. A HEP was started to address deficits. Pt will benefit from skilled PT to address impairments to optimize back function with less pain.   OBJECTIVE IMPAIRMENTS: decreased activity tolerance, decreased strength, impaired flexibility, postural dysfunction, pain, and High BMI.   ACTIVITY LIMITATIONS: carrying, lifting, standing, and squatting  PARTICIPATION LIMITATIONS: meal prep, cleaning, laundry, shopping, and community activity  PERSONAL FACTORS: Fitness, Past/current experiences, Time since onset of injury/illness/exacerbation, and 1 comorbidity: high BMI are also affecting patient's functional outcome.   REHAB POTENTIAL: Good  CLINICAL DECISION MAKING: Evolving/moderate complexity  EVALUATION COMPLEXITY: Moderate   GOALS:  SHORT TERM GOALS: Target date: 12/18/23 Pt will be Ind in an initial HEP  Baseline:started Goal status: ONGOING  2.  Pt will report 25% or greater improvement in her back and rib pain for improved function and  QOL Baseline: 6/10 Goal status: ONGOING  LONG TERM GOALS: Target date: 03/18/24  Pt will be Ind in a final HEP to maintain achieved LOF  Baseline:  Goal status: INITIAL  2.   Pt will report 75% or greater improvement in her back and rib pain for improved function and  QOL Baseline: 6/10 Goal status: INITIAL  3.  Increase bilat hip strength to 4/5 or greater for improved lumbopelvic stability Baseline: see flow sheet Goal status: INITIAL  4.  Improve 5xSTS by MCID as indication of  improved functional mobility. Revised- improve by 3  with pt completing at a near norm level.  Baseline: 11.8 Goal status: INITIAL  5.  Pt's mod Oswestry score will improved to 21% as indication of improved function  Baseline: 34% Goal status: INITIAL  6.  Pt will demonstrate improved body mechanics for household activities to minimize back strain Baseline:  Goal status: INITIAL   PLAN:  PT FREQUENCY: 1x per 2 weeks  PT DURATION: 12 weeks  PLANNED INTERVENTIONS: 97164- PT Re-evaluation, 97110-Therapeutic exercises, 97530- Therapeutic activity, 97112- Neuromuscular re-education, 97535- Self Care, [id number]- Manual therapy, [id number]- Aquatic Therapy, [id number]- Electrical stimulation (unattended), 20560 (1-2 muscles), 20561 (3+ muscles)- Dry Needling, Patient/Family education, Taping, Joint mobilization, Cryotherapy, and Moist heat  PLAN FOR NEXT SESSION: Assess response to HEP; progress therex as indicated; use of modalities, manual therapy; and TPDN as indicated.    Lum Stillinger MS, PT 01/11/24 10:08 PM

## 2024-01-12 ENCOUNTER — Ambulatory Visit

## 2024-01-12 DIAGNOSIS — M5459 Other low back pain: Secondary | ICD-10-CM

## 2024-01-12 DIAGNOSIS — M6281 Muscle weakness (generalized): Secondary | ICD-10-CM

## 2024-01-12 DIAGNOSIS — R293 Abnormal posture: Secondary | ICD-10-CM

## 2024-01-12 NOTE — Therapy (Signed)
 OUTPATIENT PHYSICAL THERAPY LOWER EXTREMITY EVALUATION   Patient Name: Leah Powers MRN: [id number] DOB:February 28, 1961, 63 y.o., female Today's Date: 01/12/2024  END OF SESSION:  PT End of Session - 01/12/24 1433     Visit Number 3    Number of Visits 7    Date for Recertification  03/18/24    Authorization Type [place] MEDICAID [employer]    Authorization - Visit Number 3    Authorization - Number of Visits 27    PT Start Time 1415    PT Stop Time 1500    PT Time Calculation (min) 45 min    Activity Tolerance Patient tolerated treatment well    Behavior During Therapy WFL for tasks assessed/performed            Past Medical History:  Diagnosis Date   Allergy    Arthritis    L hand, R ankle  (10/08/2016)   Bipolar 1 disorder (HCC)    Depression    Diabetes mellitus without complication (HCC)    Fibromyalgia    told that the breakout on the upper back , could be over active nerves , also treated with cream & Gabapentin - [facility] [place]    History of kidney stones    spontaneous passing   Hypertension    Hypothyroid    Pneumonia ~ 1967; 07/2016   Pyelonephritis 1997   treated with IV antibiotic    Past Surgical History:  Procedure Laterality Date   BIOPSY  11/04/2022   Procedure: BIOPSY;  Surgeon: Unk Corinn Skiff, MD;  Location: [facility] ENDOSCOPY;  Service: Gastroenterology;;   BLADDER INSTILLATION  X 2   CHALAZION EXCISION Bilateral    R- eye X2 , L eye- X1   COLONOSCOPY     COLONOSCOPY WITH PROPOFOL  N/A 11/04/2022   Procedure: COLONOSCOPY WITH PROPOFOL ;  Surgeon: Unk Corinn Skiff, MD;  Location: [facility] ENDOSCOPY;  Service: Gastroenterology;  Laterality: N/A;  Patient does not want to be the first patient.  Prefers mid-morning   COLONOSCOPY WITH PROPOFOL  N/A 02/05/2023   Procedure: COLONOSCOPY WITH PROPOFOL ;  Surgeon: Unk Corinn Skiff, MD;  Location: [facility] ENDOSCOPY;  Service: Gastroenterology;  Laterality: N/A;    ESOPHAGOGASTRODUODENOSCOPY (EGD) WITH PROPOFOL  N/A 11/04/2022   Procedure: ESOPHAGOGASTRODUODENOSCOPY (EGD) WITH PROPOFOL ;  Surgeon: Unk Corinn Skiff, MD;  Location: [facility] ENDOSCOPY;  Service: Gastroenterology;  Laterality: N/A;   FOOT ARTHRODESIS Right 10/08/2016   Procedure: RIGHT TRIPLE ARTHRODESIS;  Surgeon: Harden Jerona GAILS, MD;  Location: [facility] OR;  Service: Orthopedics;  Laterality: Right;   FOOT ARTHRODESIS, TRIPLE Right 10/08/2016   ankle   FRENULECTOMY, LINGUAL  1970s   LAPAROSCOPIC CHOLECYSTECTOMY  1996   PALATE SURGERY  1970s   tissue removed from upper palate as a child    URETHRAL DILATION     Patient Active Problem List   Diagnosis Date Noted   Benign essential hypertension 08/27/2023   Class 2 obesity 08/27/2023   Decreased estrogen level 08/27/2023   Morbid obesity (HCC) 08/27/2023   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   Hyperlipidemia associated with type 2 diabetes mellitus (HCC) 04/27/2023   Absolute anemia 12/30/2022   History of colon polyps 11/04/2022   Nausea and vomiting 11/04/2022   Gastritis 11/04/2022   Local edema 07/30/2022   Mild intermittent asthma without complication 07/30/2022   Vitamin D  deficiency 07/30/2022   Snoring 01/24/2019   AKI (acute kidney injury) 05/20/2018   Lithium  toxicity 05/20/2018   Type 2 diabetes mellitus without complication, without long-term current use of  insulin  (HCC) 05/20/2018   Bipolar I disorder (HCC) 02/04/2018   S/P ankle fusion 11/12/2016   Asymmetric SNHL (sensorineural hearing loss) 11/11/2016   Posterior tibial tendon dysfunction (PTTD) of right lower extremity 10/08/2016   Neurodermatitis 07/30/2015   HTN (hypertension) 05/31/2014   Hypothyroidism 05/31/2014    PCP: Wendolyn Jenkins Jansky, MD   REFERRING PROVIDER: Leonce Katz, DO   REFERRING DIAG:  M54.42,M54.41,G89.29 (ICD-10-CM) - Chronic bilateral low back pain with bilateral sciatica  M51.362 (ICD-10-CM) - Degeneration of intervertebral disc of lumbar  region with discogenic back pain and lower extremity pain  R07.81 (ICD-10-CM) - Rib pain    THERAPY DIAG:  Other low back pain  Muscle weakness (generalized)  Abnormal posture  Rationale for Evaluation and Treatment: Rehabilitation  ONSET DATE: 4 months ago  SUBJECTIVE:   SUBJECTIVE STATEMENT: Pt reports approx 70% in her low back and posterior rib pain.  PERTINENT HISTORY: High BMI, as above  PAIN:  Are you having pain? Yes: NPRS scale: 0/10 Pain location: low back, L>R, and ribs Pain description: throb Aggravating factors: Prolonged standing, 5-10 mins Relieving factors: Sit, rest  PRECAUTIONS: None  RED FLAGS: None   WEIGHT BEARING RESTRICTIONS: No  FALLS:  Has patient fallen in last 6 months? No  LIVING ENVIRONMENT: Lives with: lives with their family Lives in: House/apartment Able to access home  OCCUPATION: retired  PLOF: Independent  PATIENT GOALS: To live more comfortably, be a little more active, better QOL   NEXT MD VISIT: 01/06/24  OBJECTIVE:  Note: Objective measures were completed at Evaluation unless otherwise noted.  DIAGNOSTIC FINDINGS:  12/02/23 IMPRESSION: Negative radiographs of the ribs.  IMPRESSION: 1. Multilevel degenerative disc disease and facet hypertrophy, most prominent at L4-L5 and L5-S1. 2. Grade 1/2 anterolisthesis of L4 on L5.  IMPRESSION: Mild bilateral hip osteoarthritis.  PATIENT SURVEYS:  Modified Oswestry: 17/50= 34%  Minimally Clinically Important Difference (MCID) = 12.8%  COGNITION: Overall cognitive status: Within functional limits for tasks assessed     SENSATION: WFL  EDEMA:  NA  MUSCLE LENGTH: Hamstrings: Right WNLs deg; Left WNLs deg Debby test: Right Tight deg; Left Tight deg  POSTURE: rounded shoulders, forward head, and increased lumbar lordosisgenu valgum, pes planus  PALPATION: TTP on the R QL and upper gluteal muscles  LUMBAR ROM:   AROM eval  Flexion Full, lordosis   Extension Mod limited  Right lateral flexion Min limited  Left lateral flexion Min limited  Right rotation Full  Left rotation Full, L thoracic pain   (Blank rows = not tested)  LOWER EXTREMITY ROM:     WNLs Active  Right eval Left eval  Hip flexion    Hip extension    Hip abduction    Hip adduction    Hip internal rotation    Hip external rotation    Knee flexion    Knee extension    Ankle dorsiflexion    Ankle plantarflexion    Ankle inversion    Ankle eversion     (Blank rows = not tested)  LOWER EXTREMITY MMT:    Myotome screen negative MMT Right eval Left eval  Hip flexion 4 4  Hip extension    Hip abduction 3+ 3+  Hip adduction    Hip internal rotation    Hip external rotation 3+ 3+  Knee flexion    Knee extension    Ankle dorsiflexion    Ankle plantarflexion    Ankle inversion    Ankle eversion     (  Blank rows = not tested)  LUMBAR SPECIAL TESTS:  Straight leg raise test: Negative and Slump test: Negative Weak core  FUNCTIONAL TESTS:  5 times sit to stand: TBA  GAIT: Distance walked: 200' Assistive device utilized: None Level of assistance: Complete Independence Comments: WNLs                                                                                                          TREATMENT DATE:  [facility] Adult PT Treatment:                                                DATE: 01/12/24 Therapeutic Exercise/Activity: Hamstring Stretch in sitting  x2 30 Seated Flexion Stretch with chair  x10 5-20 Supine Lower Trunk Rotation  x5 5 Child's Pose Stretch x5 5 Supine Piriformis Stretch x2  30 Supine figure 4 stretch x2 30 Supine Posterior Pelvic Tilt  x15 3 90/90 Ab bracing x5 10 Supine Bridge with Resistance Band  x15 3' Sidelying Hip Abduction  x15 3 S/L Clamshell with Resistance  x15 3 Updated HEP  [facility] Adult PT Treatment:                                                DATE: 01/05/24 Therapeutic Exercise/Activity: Hamstring  Stretch in sitting  x2 30 Seated Flexion Stretch with chair  x10 5-20 Supine Lower Trunk Rotation  x5 5 Child's Pose Stretch x5 5 Supine Piriformis Stretch x2  30 Supine Posterior Pelvic Tilt  x15 3 Supine Bridge with Resistance Band  x15 3' Sidelying Hip Abduction  x15 3 S/L Clamshell with Resistance  x15 3   PATIENT EDUCATION:  Education details: Eval findings, POC, HEP, self care  Person educated: Patient Education method: Explanation, Demonstration, Tactile cues, Verbal cues, and Handouts Education comprehension: verbalized understanding, returned demonstration, verbal cues required, and tactile cues required  HOME EXERCISE PROGRAM: Access Code: [email or web address] URL: [email or web address] Date: 01/12/2024 Prepared by: Dasie Daft  Exercises - Seated Hamstring Stretch  - 1 x daily - 7 x weekly - 1 sets - 3 reps - 30 hold - Seated Flexion Stretch with Swiss Ball  - 1 x daily - 7 x weekly - 1 sets - 5 reps - 5-20 hold - Prone Quadriceps Stretch with Strap  - 1 x daily - 7 x weekly - 1 sets - 3 reps - 30 hold - Child's Pose Stretch  - 1 x daily - 7 x weekly - 1 sets - 5 reps - 20 hold - Supine Lower Trunk Rotation  - 5 x daily - 7 x weekly - 1 sets - 5 reps - 5 hold - Supine Piriformis Stretch with Foot on Ground  - 1 x daily - 7 x weekly - 1 sets - 3 reps - 30  hold - Supine Posterior Pelvic Tilt  - 1 x daily - 7 x weekly - 3 sets - 10 reps - 3 hold - Supine Bridge with Resistance Band  - 1 x daily - 7 x weekly - 2 sets - 10 reps - 3 hold - Sidelying Hip Abduction  - 1 x daily - 7 x weekly - 2 sets - 10 reps - 3 hold - Clamshell with Resistance  - 1 x daily - 7 x weekly - 2 sets - 10 reps - 3 hold - Supine Figure 4 Piriformis Stretch  - 1 x daily - 7 x weekly - 1 sets - 3 reps - 30 hold - Supine 90/90 Abdominal Bracing  - 1 x daily - 7 x weekly - 1 sets - 5 reps - 5 hold  ASSESSMENT:  CLINICAL IMPRESSION: Pt reports very good improvement in her low back and rib  pain. Additional exs were added for a hip flexor stretch and core strengthening.  Pt's HEP was updated. Skilled PT was provided for most effective technique and to monitor tolerance. Pt will continue to benefit from skilled PT to address impairments for improved function c minimized pain.  EVAL: Patient is a 63 y.o. female who was seen today for physical therapy evaluation and treatment for  M54.42,M54.41,G89.29 (ICD-10-CM) - Chronic bilateral low back pain with bilateral sciatica  M51.362 (ICD-10-CM) - Degeneration of intervertebral disc of lumbar region with discogenic back pain and lower extremity pain  R07.81 (ICD-10-CM) - Rib pain  Pt presents with signs and symptoms consistent with her referral and imaging. Pt's demonstrates weak hips and core and postural deficits. A HEP was started to address deficits. Pt will benefit from skilled PT to address impairments to optimize back function with less pain.   OBJECTIVE IMPAIRMENTS: decreased activity tolerance, decreased strength, impaired flexibility, postural dysfunction, pain, and High BMI.   ACTIVITY LIMITATIONS: carrying, lifting, standing, and squatting  PARTICIPATION LIMITATIONS: meal prep, cleaning, laundry, shopping, and community activity  PERSONAL FACTORS: Fitness, Past/current experiences, Time since onset of injury/illness/exacerbation, and 1 comorbidity: high BMI are also affecting patient's functional outcome.   REHAB POTENTIAL: Good  CLINICAL DECISION MAKING: Evolving/moderate complexity  EVALUATION COMPLEXITY: Moderate   GOALS:  SHORT TERM GOALS: Target date: 01/14/24 Pt will be Ind in an initial HEP  Baseline:started Goal status: MET  2.  Pt will report 25% or greater improvement in her back and rib pain for improved function and  QOL Baseline: 6/10 01/12/24: 70% Goal status: MET  LONG TERM GOALS: Target date: 03/18/24  Pt will be Ind in a final HEP to maintain achieved LOF  Baseline:  Goal status: INITIAL  2.    Pt will report 75% or greater improvement in her back and rib pain for improved function and  QOL Baseline: 6/10 Goal status: INITIAL  3.  Increase bilat hip strength to 4/5 or greater for improved lumbopelvic stability Baseline: see flow sheet Goal status: INITIAL  4.  Improve 5xSTS by MCID as indication of improved functional mobility. Revised- improve by 3  with pt completing at a near norm level.  Baseline: 11.8 Goal status: INITIAL  5.  Pt's mod Oswestry score will improved to 21% as indication of improved function  Baseline: 34% Goal status: INITIAL  6.  Pt will demonstrate improved body mechanics for household activities to minimize back strain Baseline:  Goal status: INITIAL   PLAN:  PT FREQUENCY: 1x per 2 weeks  PT DURATION: 12 weeks  PLANNED INTERVENTIONS:  02835- PT Re-evaluation, 97110-Therapeutic exercises, 97530- Therapeutic activity, [id number]- Neuromuscular re-education, 97535- Self Care, [id number]- Manual therapy, [phone]- Aquatic Therapy, [phone]- Electrical stimulation (unattended), 20560 (1-2 muscles), 20561 (3+ muscles)- Dry Needling, Patient/Family education, Taping, Joint mobilization, Cryotherapy, and Moist heat  PLAN FOR NEXT SESSION: Assess response to HEP; progress therex as indicated; use of modalities, manual therapy; and TPDN as indicated.   Skyley Grandmaison MS, PT 01/12/24 3:27 PM

## 2024-01-21 ENCOUNTER — Ambulatory Visit: Admitting: Psychiatry

## 2024-01-21 ENCOUNTER — Ambulatory Visit

## 2024-01-21 DIAGNOSIS — R92323 Mammographic fibroglandular density, bilateral breasts: Secondary | ICD-10-CM | POA: Diagnosis not present

## 2024-01-21 DIAGNOSIS — Z1231 Encounter for screening mammogram for malignant neoplasm of breast: Secondary | ICD-10-CM | POA: Diagnosis not present

## 2024-01-21 LAB — HM MAMMOGRAPHY

## 2024-01-22 ENCOUNTER — Ambulatory Visit

## 2024-01-25 ENCOUNTER — Ambulatory Visit: Payer: Self-pay | Admitting: Family Medicine

## 2024-01-25 ENCOUNTER — Encounter: Payer: Self-pay | Admitting: Family Medicine

## 2024-01-26 ENCOUNTER — Ambulatory Visit

## 2024-01-27 ENCOUNTER — Ambulatory Visit: Admitting: Dermatology

## 2024-02-04 NOTE — Therapy (Signed)
 OUTPATIENT PHYSICAL THERAPY LOWER EXTREMITY EVALUATION   Patient Name: Leah Powers MRN: [id number] DOB:[birth year], 63 y.o., female Today's Date: 02/05/2024  END OF SESSION:  PT End of Session - 02/05/24 1155     Visit Number 4    Number of Visits 7    Date for Recertification  03/18/24    Authorization Type [place] MEDICAID [employer]    Authorization - Visit Number 4    Authorization - Number of Visits 27    PT Start Time 1149    PT Stop Time 1230    PT Time Calculation (min) 41 min    Activity Tolerance Patient tolerated treatment well    Behavior During Therapy WFL for tasks assessed/performed            Past Medical History:  Diagnosis Date   Allergy    Arthritis    L hand, R ankle  (10/08/2016)   Bipolar 1 disorder (HCC)    Depression    Diabetes mellitus without complication (HCC)    Fibromyalgia    told that the breakout on the upper back , could be over active nerves , also treated with cream & Gabapentin - [facility] [place]    History of kidney stones    spontaneous passing   Hypertension    Hypothyroid    Pneumonia ~ 1967; 07/2016   Pyelonephritis 1997   treated with IV antibiotic    Past Surgical History:  Procedure Laterality Date   BIOPSY  11/04/2022   Procedure: BIOPSY;  Surgeon: Unk Corinn Skiff, MD;  Location: [facility] ENDOSCOPY;  Service: Gastroenterology;;   BLADDER INSTILLATION  X 2   CHALAZION EXCISION Bilateral    R- eye X2 , L eye- X1   COLONOSCOPY     COLONOSCOPY WITH PROPOFOL  N/A 11/04/2022   Procedure: COLONOSCOPY WITH PROPOFOL ;  Surgeon: Unk Corinn Skiff, MD;  Location: [facility] ENDOSCOPY;  Service: Gastroenterology;  Laterality: N/A;  Patient does not want to be the first patient.  Prefers mid-morning   COLONOSCOPY WITH PROPOFOL  N/A 02/05/2023   Procedure: COLONOSCOPY WITH PROPOFOL ;  Surgeon: Unk Corinn Skiff, MD;  Location: [facility] ENDOSCOPY;  Service: Gastroenterology;  Laterality: N/A;    ESOPHAGOGASTRODUODENOSCOPY (EGD) WITH PROPOFOL  N/A 11/04/2022   Procedure: ESOPHAGOGASTRODUODENOSCOPY (EGD) WITH PROPOFOL ;  Surgeon: Unk Corinn Skiff, MD;  Location: [facility] ENDOSCOPY;  Service: Gastroenterology;  Laterality: N/A;   FOOT ARTHRODESIS Right 10/08/2016   Procedure: RIGHT TRIPLE ARTHRODESIS;  Surgeon: Harden Jerona GAILS, MD;  Location: [facility] OR;  Service: Orthopedics;  Laterality: Right;   FOOT ARTHRODESIS, TRIPLE Right 10/08/2016   ankle   FRENULECTOMY, LINGUAL  1970s   LAPAROSCOPIC CHOLECYSTECTOMY  1996   PALATE SURGERY  1970s   tissue removed from upper palate as a child    URETHRAL DILATION     Patient Active Problem List   Diagnosis Date Noted   Benign essential hypertension 08/27/2023   Class 2 obesity 08/27/2023   Decreased estrogen level 08/27/2023   Morbid obesity (HCC) 08/27/2023   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   Hyperlipidemia associated with type 2 diabetes mellitus (HCC) 04/27/2023   Absolute anemia 12/30/2022   History of colon polyps 11/04/2022   Nausea and vomiting 11/04/2022   Gastritis 11/04/2022   Local edema 07/30/2022   Mild intermittent asthma without complication 07/30/2022   Vitamin D  deficiency 07/30/2022   Snoring 01/24/2019   AKI (acute kidney injury) 05/20/2018   Lithium  toxicity 05/20/2018   Type 2 diabetes mellitus without complication, without long-term current use of  insulin  (HCC) 05/20/2018   Bipolar I disorder (HCC) 02/04/2018   S/P ankle fusion 11/12/2016   Asymmetric SNHL (sensorineural hearing loss) 11/11/2016   Posterior tibial tendon dysfunction (PTTD) of right lower extremity 10/08/2016   Neurodermatitis 07/30/2015   HTN (hypertension) 05/31/2014   Hypothyroidism 05/31/2014    PCP: Wendolyn Jenkins Jansky, MD   REFERRING PROVIDER: Leonce Katz, DO   REFERRING DIAG:  M54.42,M54.41,G89.29 (ICD-10-CM) - Chronic bilateral low back pain with bilateral sciatica  M51.362 (ICD-10-CM) - Degeneration of intervertebral disc of lumbar  region with discogenic back pain and lower extremity pain  R07.81 (ICD-10-CM) - Rib pain    THERAPY DIAG:  Other low back pain  Muscle weakness (generalized)  Abnormal posture  Rationale for Evaluation and Treatment: Rehabilitation  ONSET DATE: 4 months ago  SUBJECTIVE:   SUBJECTIVE STATEMENT: Pt states completion of her HEP has been somewhat limited with her needing to care for her sister who underwent surgery. Pt notes her low back and ribs are better, and she is only taking tylenol  vs meloxicam .  PERTINENT HISTORY: High BMI, as above  PAIN:  Are you having pain? Yes: NPRS scale: 0/10 Pain location: low back, L>R, and ribs Pain description: throb Aggravating factors: Prolonged standing, 5-10 mins Relieving factors: Sit, rest  PRECAUTIONS: None  RED FLAGS: None   WEIGHT BEARING RESTRICTIONS: No  FALLS:  Has patient fallen in last 6 months? No  LIVING ENVIRONMENT: Lives with: lives with their family Lives in: House/apartment Able to access home  OCCUPATION: retired  PLOF: Independent  PATIENT GOALS: To live more comfortably, be a little more active, better QOL   NEXT MD VISIT: 01/06/24  OBJECTIVE:  Note: Objective measures were completed at Evaluation unless otherwise noted.  DIAGNOSTIC FINDINGS:  12/02/23 IMPRESSION: Negative radiographs of the ribs.  IMPRESSION: 1. Multilevel degenerative disc disease and facet hypertrophy, most prominent at L4-L5 and L5-S1. 2. Grade 1/2 anterolisthesis of L4 on L5.  IMPRESSION: Mild bilateral hip osteoarthritis.  PATIENT SURVEYS:  Modified Oswestry: 17/50= 34%  Minimally Clinically Important Difference (MCID) = 12.8%  COGNITION: Overall cognitive status: Within functional limits for tasks assessed     SENSATION: WFL  EDEMA:  NA  MUSCLE LENGTH: Hamstrings: Right WNLs deg; Left WNLs deg Debby test: Right Tight deg; Left Tight deg  POSTURE: rounded shoulders, forward head, and increased lumbar  lordosisgenu valgum, pes planus  PALPATION: TTP on the R QL and upper gluteal muscles  LUMBAR ROM:   AROM eval  Flexion Full, lordosis  Extension Mod limited  Right lateral flexion Min limited  Left lateral flexion Min limited  Right rotation Full  Left rotation Full, L thoracic pain   (Blank rows = not tested)  LOWER EXTREMITY ROM:     WNLs Active  Right eval Left eval  Hip flexion    Hip extension    Hip abduction    Hip adduction    Hip internal rotation    Hip external rotation    Knee flexion    Knee extension    Ankle dorsiflexion    Ankle plantarflexion    Ankle inversion    Ankle eversion     (Blank rows = not tested)  LOWER EXTREMITY MMT:    Myotome screen negative MMT Right eval Left eval  Hip flexion 4 4  Hip extension    Hip abduction 3+ 3+  Hip adduction    Hip internal rotation    Hip external rotation 3+ 3+  Knee flexion  Knee extension    Ankle dorsiflexion    Ankle plantarflexion    Ankle inversion    Ankle eversion     (Blank rows = not tested)  LUMBAR SPECIAL TESTS:  Straight leg raise test: Negative and Slump test: Negative Weak core  FUNCTIONAL TESTS:  5 times sit to stand: TBA  GAIT: Distance walked: 200' Assistive device utilized: None Level of assistance: Complete Independence Comments: WNLs                                                                                                          TREATMENT DATE:  [facility] Adult PT Treatment:                                                DATE: 02/05/24 Therapeutic Exercise/Activity: Hamstring Stretch in sitting  x2 30 Seated Flexion Stretch with chair  x10 5-20 Supine Lower Trunk Rotation  x5 5 Child's Pose Stretch x5 5 Supine Piriformis Stretch x2  30 Supine Posterior Pelvic Tilt  x15 3 Supine Bridge with Resistance Band  x15 3' Sidelying Hip Abduction  x15 3 S/L Clamshell with Resistance  x15 3  [facility] Adult PT Treatment:                                                 DATE: 01/05/24 Therapeutic Exercise/Activity: 5xSTS= 11.8 sec Hamstring Stretch in sitting  x2 30 Seated Flexion Stretch with chair  x10 5-20 Supine Lower Trunk Rotation  x5 5 Child's Pose Stretch x5 5 Supine Piriformis Stretch x2  30 Supine Posterior Pelvic Tilt  x15 3 Supine Bridge with Resistance Band  x15 3' Sidelying Hip Abduction  x15 3 S/L Clamshell with Resistance  x15 3   PATIENT EDUCATION:  Education details: Eval findings, POC, HEP, self care  Person educated: Patient Education method: Explanation, Demonstration, Tactile cues, Verbal cues, and Handouts Education comprehension: verbalized understanding, returned demonstration, verbal cues required, and tactile cues required  HOME EXERCISE PROGRAM: Access Code: [email or web address] URL: [email or web address] Date: 01/05/2024 Prepared by: Dasie Daft  Exercises - Seated Hamstring Stretch  - 1 x daily - 7 x weekly - 1 sets - 3 reps - 30 hold - Seated Flexion Stretch with Swiss Ball  - 1 x daily - 7 x weekly - 1 sets - 5 reps - 5-20 hold - Prone Quadriceps Stretch with Strap  - 1 x daily - 7 x weekly - 1 sets - 3 reps - 30 hold - Child's Pose Stretch  - 1 x daily - 7 x weekly - 1 sets - 5 reps - 20 hold - Supine Lower Trunk Rotation  - 5 x daily - 7 x weekly - 1 sets - 5 reps - 5 hold - Supine Piriformis Stretch with Foot on Ground  -  1 x daily - 7 x weekly - 1 sets - 3 reps - 30 hold - Supine Posterior Pelvic Tilt  - 1 x daily - 7 x weekly - 3 sets - 10 reps - 3 hold - Supine Bridge with Resistance Band  - 1 x daily - 7 x weekly - 2 sets - 10 reps - 3 hold - Sidelying Hip Abduction  - 1 x daily - 7 x weekly - 2 sets - 10 reps - 3 hold - Clamshell with Resistance  - 1 x daily - 7 x weekly - 2 sets - 10 reps - 3 hold  ASSESSMENT:  CLINICAL IMPRESSION: Pt is doing well with the low back and rib pain being much better. Pt returns proper demonstration of her exs. Continued PT for lumbopelvic strengthening  and flexibility. Pt is scheduled for 1 more PT session and if she is doing well at that time anticipate DC from PT services.  EVAL: Patient is a 63 y.o. female who was seen today for physical therapy evaluation and treatment for  M54.42,M54.41,G89.29 (ICD-10-CM) - Chronic bilateral low back pain with bilateral sciatica  M51.362 (ICD-10-CM) - Degeneration of intervertebral disc of lumbar region with discogenic back pain and lower extremity pain  R07.81 (ICD-10-CM) - Rib pain  Pt presents with signs and symptoms consistent with her referral and imaging. Pt's demonstrates weak hips and core and postural deficits. A HEP was started to address deficits. Pt will benefit from skilled PT to address impairments to optimize back function with less pain.   OBJECTIVE IMPAIRMENTS: decreased activity tolerance, decreased strength, impaired flexibility, postural dysfunction, pain, and High BMI.   ACTIVITY LIMITATIONS: carrying, lifting, standing, and squatting  PARTICIPATION LIMITATIONS: meal prep, cleaning, laundry, shopping, and community activity  PERSONAL FACTORS: Fitness, Past/current experiences, Time since onset of injury/illness/exacerbation, and 1 comorbidity: high BMI are also affecting patient's functional outcome.   REHAB POTENTIAL: Good  CLINICAL DECISION MAKING: Evolving/moderate complexity  EVALUATION COMPLEXITY: Moderate   GOALS:  SHORT TERM GOALS: Target date: 12/18/23 Pt will be Ind in an initial HEP  Baseline:started Goal status: MET  2.  Pt will report 25% or greater improvement in her back and rib pain for improved function and  QOL Baseline: 80+% improvement Goal status: ONGOING  LONG TERM GOALS: Target date: 03/18/24  Pt will be Ind in a final HEP to maintain achieved LOF  Baseline:  Goal status: INITIAL  2.   Pt will report 75% or greater improvement in her back and rib pain for improved function and  QOL Baseline: 6/10 Goal status: INITIAL  3.  Increase bilat hip  strength to 4/5 or greater for improved lumbopelvic stability Baseline: see flow sheet Goal status: INITIAL  4.  Improve 5xSTS by MCID as indication of improved functional mobility. Revised- improve by 3  with pt completing at a near norm level.  Baseline: 11.8 Goal status: INITIAL  5.  Pt's mod Oswestry score will improved to 21% as indication of improved function  Baseline: 34% Goal status: INITIAL  6.  Pt will demonstrate improved body mechanics for household activities to minimize back strain Baseline:  Goal status: INITIAL   PLAN:  PT FREQUENCY: 1x per 2 weeks  PT DURATION: 12 weeks  PLANNED INTERVENTIONS: 97164- PT Re-evaluation, 97110-Therapeutic exercises, 97530- Therapeutic activity, 97112- Neuromuscular re-education, 97535- Self Care, 02859- Manual therapy, [id number]- Aquatic Therapy, H9716- Electrical stimulation (unattended), 20560 (1-2 muscles), 20561 (3+ muscles)- Dry Needling, Patient/Family education, Taping, Joint mobilization, Cryotherapy, and Moist heat  PLAN FOR NEXT SESSION: Assess response to HEP; progress therex as indicated; use of modalities, manual therapy; and TPDN as indicated.   Tamitha Norell MS, PT 02/05/24 12:34 PM

## 2024-02-05 ENCOUNTER — Ambulatory Visit: Attending: Sports Medicine

## 2024-02-05 DIAGNOSIS — M6281 Muscle weakness (generalized): Secondary | ICD-10-CM | POA: Insufficient documentation

## 2024-02-05 DIAGNOSIS — M5459 Other low back pain: Secondary | ICD-10-CM | POA: Insufficient documentation

## 2024-02-05 DIAGNOSIS — R293 Abnormal posture: Secondary | ICD-10-CM | POA: Diagnosis present

## 2024-02-09 ENCOUNTER — Ambulatory Visit

## 2024-02-16 ENCOUNTER — Encounter: Payer: Self-pay | Admitting: Family Medicine

## 2024-02-16 ENCOUNTER — Other Ambulatory Visit: Payer: Self-pay | Admitting: Sports Medicine

## 2024-02-18 ENCOUNTER — Encounter: Payer: Self-pay | Admitting: Dermatology

## 2024-02-18 ENCOUNTER — Encounter: Payer: Self-pay | Admitting: Psychiatry

## 2024-02-18 ENCOUNTER — Ambulatory Visit: Admitting: Psychiatry

## 2024-02-18 DIAGNOSIS — F319 Bipolar disorder, unspecified: Secondary | ICD-10-CM | POA: Diagnosis not present

## 2024-02-18 NOTE — Progress Notes (Unsigned)
      [facility] Counselor/Therapist Progress Note  Patient ID: Leah Powers, MRN: [id number],    Date: 02/18/2024  Time Spent: *** start time 10:08 AM  Treatment Type: Individual Therapy  Reported Symptoms: anxiety, rash from stress, sadness, triggered responses  Mental Status Exam:  Appearance:   Well Groomed     Behavior:  Appropriate  Motor:  Normal  Speech/Language:   Normal Rate  Affect:  Appropriate  Mood:  anxious  Thought process:  normal  Thought content:    WNL  Sensory/Perceptual disturbances:    WNL  Orientation:  oriented to person, place, time/date, and situation  Attention:  Good  Concentration:  Good  Memory:  WNL  Fund of knowledge:   Good  Insight:    Good  Judgment:   Good  Impulse Control:  Good   Risk Assessment: Danger to Self:  No Self-injurious Behavior: No Danger to Others: No Duty to Warn:no Physical Aggression / Violence:No  Access to Firearms a concern: No  Gang Involvement:No   Subjective: Patient was present for session. She shared that in the middle of October her sister called and she shared she needed her to help because she had to have surgery and she had multiple issues and she had to stay for almost a week so patient had to change her appointments to be able to be there for her.  She went on to share her husband is also having health issues and she is trying to take care of him as well. She has been going to physical therapy and they are seeing progress for her. She had an episode at [facility] where she had an issue of over heating and getting sick. She is going to be seeing her GI doctor to check tout her upper colon. She is also having car trouble which is another stressor.   Interventions: {[phone]}  Diagnosis:   ICD-10-CM   1. Bipolar I disorder (HCC)  F31.9       Plan:  Patient is to practice grounding skills and breathing exercises addressed in treatment.  Patient is to work on cognitive distortions to discuss further  at next session.  Patient is to practice DBT skill T IPP and self brain spotting to help calm herself. Patient is to continue seeing providers for her medical issues and medication.   Silvano Pacini, [facility]

## 2024-02-18 NOTE — Telephone Encounter (Signed)
 This was the pt's A&P at her last visit.  Please remind her the the ketoconazole  and hydrocortisone  creams were for the sed derm on her face.  She should apply the neurorelief cream to the pruritus flare on her lower back / but.    Of note, pt had an appointment on 11/12 but she canceled it.    Assessment & Plan 1. Neurodermatitis - Assessment:  Patient reports a flare of neurodermatitis for the past three weeks. Previously well-controlled with gabapentin , triamcinolone , and saccharomyces. Patient mentions a stiff neck and recent change in pillows. Examination reveals one active excoriation and hypopigmentation from prior excoriation.   - Plan:    Continue current medication:     - Gabapentin  800 mg PO TID    Start new topical treatment:     - NeuroRelief cream (containing gabapentin , lidocaine , and amitriptyline) topically BID-TID     - Apply thin layer, can mix with anti-itch lotion (e.g., CeraVe Anti-Itch or Cerna) for easier application    Follow up in November when weather changes   2. 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 Seborrheic Dermatitis - Assessment: Patient has seborrheic dermatitis affecting the face and forehead.   - Plan:    Prescribe topical treatments:     - Ketoconazole  cream mixed with hydrocortisone  for up to 10 days     - Luliconazole cream for face and forehead    Provide samples of zinc-based shampoo   Follow-up in November for reassessment of neurodermatitis, or sooner if needed.

## 2024-02-22 ENCOUNTER — Telehealth: Payer: Self-pay | Admitting: Dermatology

## 2024-02-22 NOTE — Telephone Encounter (Signed)
 Hi Brooke,  Did you call this patient in addition to sending a mychart message?  The AP I sent to you was as for you to use as a reference when you call the patient.  Please call as a follow up and verbally guide her and get more info into her current issue/  Thanks.  Dr Alm

## 2024-02-22 NOTE — Telephone Encounter (Signed)
 Pt called in and stated she no longer needs a call back from clinical team, she will be going to see a different dermatologist.

## 2024-02-22 NOTE — Progress Notes (Deleted)
 Ben Jackson D[facility] [address] [place] [address] Phone: [phone]   Assessment and Plan:     ***    Pertinent previous records reviewed include ***   Follow Up: ***     Subjective:   I, Fahim Kats, am serving as a neurosurgeon for Doctor Morene Mace   Chief Complaint: multiple musculoskeletal pain   HPI:    11/25/2023 Patient is a 63 year old female with multiple complaints. Patient states late spring she started getting pain flares in her ribs down to the abdomin. Ice and topical creams don't help. Decreased ROM. Pain goes to the glutes. She is TTP throughout the upper back. She uses a cane in the am 4 pronged. States she can't take deep breathes. Sleeps on the floor or recliner when she is flared. States she had a fall in 1986 that did not heal properly. She isn't able to sleep through the night when flared. The worst of the pain has stopped since last month. Naproxen  does help when she is flared.     01/06/2024 Patient states once she stopped meloxicam  pain came back.But, she is doing much better. She has been taking half for the past 4 days and that has helped   02/23/2024 Patient states   Relevant Historical Information: Hypertension, DM type II, hypothyroidism, neurodermatitis  Additional pertinent review of systems negative.   Current Outpatient Medications:    albuterol  (VENTOLIN  HFA) 108 (90 Base) MCG/ACT inhaler, Inhale 2 puffs into the lungs every 6 (six) hours as needed for wheezing or shortness of breath., Disp: 1 each, Rfl: 2   cetirizine (ZYRTEC) 5 MG tablet, 1 tablet Orally Once a day, Disp: , Rfl:    dapagliflozin  propanediol (FARXIGA ) 5 MG TABS tablet, Take 1 tablet (5 mg total) by mouth daily., Disp: 90 tablet, Rfl: 3   dicyclomine  (BENTYL ) 10 MG capsule, Take 1 capsule (10 mg total) by mouth 3 (three) times daily before meals., Disp: 90 capsule, Rfl: 11   docusate sodium  (COLACE) 100 MG capsule,  Take 100 mg by mouth 3 (three) times daily., Disp: , Rfl:    Elastic Bandages & Supports (MEDICAL COMPRESSION SOCKS) MISC, Wear compression stockings daily with upright activity. Remove at bedtime., Disp: 2 each, Rfl: 0   ferrous sulfate 325 (65 FE) MG EC tablet, Take 325 mg by mouth., Disp: , Rfl:    furosemide  (LASIX ) 20 MG tablet, Take 1 tablet (20 mg total) by mouth daily., Disp: 90 tablet, Rfl: 3   gabapentin  (NEURONTIN ) 800 MG tablet, Take 1 tablet (800 mg total) by mouth 3 (three) times daily., Disp: 270 tablet, Rfl: 3   Lancets (ONETOUCH DELICA PLUS LANCET30G) MISC, Inject 1 Stick into the skin daily., Disp: 100 each, Rfl: 2   levothyroxine  (SYNTHROID ) 137 MCG tablet, Take 1 tablet (137 mcg total) by mouth daily before breakfast., Disp: 90 tablet, Rfl: 3   linaclotide  (LINZESS ) 290 MCG CAPS capsule, Take 1 capsule (290 mcg total) by mouth daily before breakfast., Disp: 90 capsule, Rfl: 3   lithium  carbonate 300 MG capsule, Take 3 capsules (900 mg total) by mouth daily., Disp: 90 capsule, Rfl: 3   losartan  (COZAAR ) 50 MG tablet, Take 1 tablet (50 mg total) by mouth daily., Disp: 90 tablet, Rfl: 3   meloxicam  (MOBIC ) 15 MG tablet, Take 1 tablet (15 mg total) by mouth daily., Disp: 30 tablet, Rfl: 0   meloxicam  (MOBIC ) 7.5 MG tablet, Take 1 tablet (7.5 mg total)  by mouth daily as needed for pain., Disp: 30 tablet, Rfl: 0   mupirocin  ointment (BACTROBAN ) 2 %, Apply topical to aa qd, Disp: 22 g, Rfl: 3   ONETOUCH VERIO test strip, 1 each by Other route daily., Disp: 100 each, Rfl: 3   pantoprazole  (PROTONIX ) 40 MG tablet, Take 1 tablet (40 mg total) by mouth daily., Disp: 90 tablet, Rfl: 3   rosuvastatin  (CRESTOR ) 10 MG tablet, TAKE 1 TABLET BY MOUTH DAILY, Disp: 90 tablet, Rfl: 3   Semaglutide  (RYBELSUS ) 7 MG TABS, Take 1 tablet (7 mg total) by mouth daily., Disp: 90 tablet, Rfl: 1   Objective:     There were no vitals filed for this visit.    There is no height or weight on file to  calculate BMI.    Physical Exam:    ***   Electronically signed by:  Odis Mace [facility] 7:29 AM 02/22/24

## 2024-02-22 NOTE — Telephone Encounter (Signed)
 Left voicemail for patient to call back regarding symptoms and previously prescribed medications.

## 2024-02-23 ENCOUNTER — Ambulatory Visit: Admitting: Sports Medicine

## 2024-02-23 VITALS — HR 72 | Ht 68.0 in | Wt 230.0 lb

## 2024-02-23 DIAGNOSIS — M51362 Other intervertebral disc degeneration, lumbar region with discogenic back pain and lower extremity pain: Secondary | ICD-10-CM | POA: Diagnosis not present

## 2024-02-23 DIAGNOSIS — G8929 Other chronic pain: Secondary | ICD-10-CM

## 2024-02-23 MED ORDER — MELOXICAM 7.5 MG PO TABS: 7.5000 mg | ORAL_TABLET | Freq: Every day | ORAL | 0 refills | Status: AC

## 2024-02-23 MED ORDER — MELOXICAM 15 MG PO TABS: 15.0000 mg | ORAL_TABLET | Freq: Every day | ORAL | 0 refills | Status: DC

## 2024-02-23 NOTE — Patient Instructions (Addendum)
-   Start meloxicam  7.5 mg daily x2 weeks.  If still having pain after 2 weeks, complete 3rd-week of NSAID. May use remaining NSAID as needed once daily for pain control.  Do not to use additional over-the-counter NSAIDs (ibuprofen, naproxen , Advil, Aleve , etc.) while taking prescription NSAIDs.  May use Tylenol  [phone] mg 2 to 3 times a day for breakthrough pain.  Meloxicam  refill   MRI lumbar   Continue HEP and PT  Follow up 1 week after to discuss results

## 2024-02-23 NOTE — Progress Notes (Signed)
 [facility] [address] [place] [address] Phone: [phone]   Assessment and Plan:     1. Chronic bilateral low back pain with bilateral sciatica (Primary) 2. Degeneration of intervertebral disc of lumbar region with discogenic back pain and lower extremity pain -Chronic with exacerbation, subsequent visit - Recurrent flare of low back pain, lateral left hip pain.  Most consistent with lumbar DDD with radiculopathy.  Greater trochanteric bursitis, left considered in differential - Start meloxicam  7.5 mg daily x2 weeks.  If still having pain after 2 weeks, complete 3rd-week of NSAID. May use remaining NSAID as needed once daily for pain control.  Do not to use additional over-the-counter NSAIDs (ibuprofen, naproxen , Advil, Aleve , etc.) while taking prescription NSAIDs.  May use Tylenol  [phone] mg 2 to 3 times a day for breakthrough pain.  -Continue HEP and physical therapy - Recommend further evaluation with lumbar MRI due to failure to improve despite >6 weeks of conservative therapy with PT/NSAIDs/HEP, pain with day-to-day activities, pain >6/10, x-ray results   Pertinent previous records reviewed include none   Follow Up: 1 week after MRI to review results and discuss treatment plan.  Could consider epidural CSI if appropriate.  Could consider greater trochanteric CSI   Subjective:   I, Leah Powers, am serving as a neurosurgeon for Doctor Morene Mace   Chief Complaint: multiple musculoskeletal pain   HPI:    11/25/2023 Patient is a 63 year old female with multiple complaints. Patient states late spring she started getting pain flares in her ribs down to the abdomin. Ice and topical creams don't help. Decreased ROM. Pain goes to the glutes. She is TTP throughout the upper back. She uses a cane in the am 4 pronged. States she can't take deep breathes. Sleeps on the floor or recliner when she is flared. States she had a  fall in 1986 that did not heal properly. She isn't able to sleep through the night when flared. The worst of the pain has stopped since last month. Naproxen  does help when she is flared.     01/06/2024 Patient states once she stopped meloxicam  pain came back.But, she is doing much better. She has been taking half for the past 4 days and that has helped   02/24/2024 Patient states pain is back    Relevant Historical Information: Hypertension, DM type II, hypothyroidism, neurodermatitis  Additional pertinent review of systems negative.   Current Outpatient Medications:    albuterol  (VENTOLIN  HFA) 108 (90 Base) MCG/ACT inhaler, Inhale 2 puffs into the lungs every 6 (six) hours as needed for wheezing or shortness of breath., Disp: 1 each, Rfl: 2   cetirizine (ZYRTEC) 5 MG tablet, 1 tablet Orally Once a day, Disp: , Rfl:    dapagliflozin  propanediol (FARXIGA ) 5 MG TABS tablet, Take 1 tablet (5 mg total) by mouth daily., Disp: 90 tablet, Rfl: 3   dicyclomine  (BENTYL ) 10 MG capsule, Take 1 capsule (10 mg total) by mouth 3 (three) times daily before meals., Disp: 90 capsule, Rfl: 11   docusate sodium  (COLACE) 100 MG capsule, Take 100 mg by mouth 3 (three) times daily., Disp: , Rfl:    Elastic Bandages & Supports (MEDICAL COMPRESSION SOCKS) MISC, Wear compression stockings daily with upright activity. Remove at bedtime., Disp: 2 each, Rfl: 0   ferrous sulfate 325 (65 FE) MG EC tablet, Take 325 mg by mouth., Disp: , Rfl:    furosemide  (LASIX ) 20 MG tablet, Take 1 tablet (  20 mg total) by mouth daily., Disp: 90 tablet, Rfl: 3   gabapentin  (NEURONTIN ) 800 MG tablet, Take 1 tablet (800 mg total) by mouth 3 (three) times daily., Disp: 270 tablet, Rfl: 3   Lancets (ONETOUCH DELICA PLUS LANCET30G) MISC, Inject 1 Stick into the skin daily., Disp: 100 each, Rfl: 2   levothyroxine  (SYNTHROID ) 137 MCG tablet, Take 1 tablet (137 mcg total) by mouth daily before breakfast., Disp: 90 tablet, Rfl: 3   linaclotide   (LINZESS ) 290 MCG CAPS capsule, Take 1 capsule (290 mcg total) by mouth daily before breakfast., Disp: 90 capsule, Rfl: 3   lithium  carbonate 300 MG capsule, Take 3 capsules (900 mg total) by mouth daily., Disp: 90 capsule, Rfl: 3   losartan  (COZAAR ) 50 MG tablet, Take 1 tablet (50 mg total) by mouth daily., Disp: 90 tablet, Rfl: 3   meloxicam  (MOBIC ) 15 MG tablet, Take 1 tablet (15 mg total) by mouth daily., Disp: 30 tablet, Rfl: 0   meloxicam  (MOBIC ) 7.5 MG tablet, Take 1 tablet (7.5 mg total) by mouth daily as needed for pain., Disp: 30 tablet, Rfl: 0   mupirocin  ointment (BACTROBAN ) 2 %, Apply topical to aa qd, Disp: 22 g, Rfl: 3   ONETOUCH VERIO test strip, 1 each by Other route daily., Disp: 100 each, Rfl: 3   pantoprazole  (PROTONIX ) 40 MG tablet, Take 1 tablet (40 mg total) by mouth daily., Disp: 90 tablet, Rfl: 3   rosuvastatin  (CRESTOR ) 10 MG tablet, TAKE 1 TABLET BY MOUTH DAILY, Disp: 90 tablet, Rfl: 3   Semaglutide  (RYBELSUS ) 7 MG TABS, Take 1 tablet (7 mg total) by mouth daily., Disp: 90 tablet, Rfl: 1   Objective:     Vitals:   02/23/24 1406  Pulse: 72  SpO2: 95%  Weight: 230 lb (104.3 kg)  Height: 5' 8 (1.727 m)      Body mass index is 34.97 kg/m.    Physical Exam:    Gen: Appears well, nad, nontoxic and pleasant Psych: Alert and oriented, appropriate mood and affect Neuro: sensation intact, strength is 5/5 in upper and lower extremities, muscle tone wnl Skin: no susupicious lesions or rashes   Back - Normal skin, Spine with normal alignment and no deformity.   No tenderness to vertebral process palpation.   Lumbar paraspinous muscles are   tender and without spasm  TTP gluteal musculature bilaterally, worse on left, left greater trochanter Straight leg raise negative Piriformis Test negative Gait normal       Electronically signed by:  Odis Mace D.[name] 2:21 PM 02/23/24

## 2024-02-23 NOTE — Therapy (Signed)
 OUTPATIENT PHYSICAL THERAPY LOWER EXTREMITY EVALUATION   Patient Name: Leah Powers MRN: [id number] DOB:1960-12-11, 63 y.o., female Today's Date: 02/23/2024  END OF SESSION:      Past Medical History:  Diagnosis Date   Allergy    Arthritis    L hand, R ankle  (10/08/2016)   Bipolar 1 disorder (HCC)    Depression    Diabetes mellitus without complication (HCC)    Fibromyalgia    told that the breakout on the upper back , could be over active nerves , also treated with cream & Gabapentin - [facility] [place]    History of kidney stones    spontaneous passing   Hypertension    Hypothyroid    Pneumonia ~ 1967; 07/2016   Pyelonephritis 1997   treated with IV antibiotic    Past Surgical History:  Procedure Laterality Date   BIOPSY  11/04/2022   Procedure: BIOPSY;  Surgeon: Unk Corinn Skiff, MD;  Location: [facility] ENDOSCOPY;  Service: Gastroenterology;;   BLADDER INSTILLATION  X 2   CHALAZION EXCISION Bilateral    R- eye X2 , L eye- X1   COLONOSCOPY     COLONOSCOPY WITH PROPOFOL  N/A 11/04/2022   Procedure: COLONOSCOPY WITH PROPOFOL ;  Surgeon: Unk Corinn Skiff, MD;  Location: [facility] ENDOSCOPY;  Service: Gastroenterology;  Laterality: N/A;  Patient does not want to be the first patient.  Prefers mid-morning   COLONOSCOPY WITH PROPOFOL  N/A 02/05/2023   Procedure: COLONOSCOPY WITH PROPOFOL ;  Surgeon: Unk Corinn Skiff, MD;  Location: [facility] ENDOSCOPY;  Service: Gastroenterology;  Laterality: N/A;   ESOPHAGOGASTRODUODENOSCOPY (EGD) WITH PROPOFOL  N/A 11/04/2022   Procedure: ESOPHAGOGASTRODUODENOSCOPY (EGD) WITH PROPOFOL ;  Surgeon: Unk Corinn Skiff, MD;  Location: [facility] ENDOSCOPY;  Service: Gastroenterology;  Laterality: N/A;   FOOT ARTHRODESIS Right 10/08/2016   Procedure: RIGHT TRIPLE ARTHRODESIS;  Surgeon: Harden Jerona GAILS, MD;  Location[facility] OR;  Service: Orthopedics;  Laterality: Right;   FOOT ARTHRODESIS, TRIPLE Right 10/08/2016   ankle   FRENULECTOMY, LINGUAL  1970s    LAPAROSCOPIC CHOLECYSTECTOMY  1996   PALATE SURGERY  1970s   tissue removed from upper palate as a child    URETHRAL DILATION     Patient Active Problem List   Diagnosis Date Noted   Benign essential hypertension 08/27/2023   Class 2 obesity 08/27/2023   Decreased estrogen level 08/27/2023   Morbid obesity (HCC) 08/27/2023   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   Hyperlipidemia associated with type 2 diabetes mellitus (HCC) 04/27/2023   Absolute anemia 12/30/2022   History of colon polyps 11/04/2022   Nausea and vomiting 11/04/2022   Gastritis 11/04/2022   Local edema 07/30/2022   Mild intermittent asthma without complication 07/30/2022   Vitamin D  deficiency 07/30/2022   Snoring 01/24/2019   AKI (acute kidney injury) 05/20/2018   Lithium  toxicity 05/20/2018   Type 2 diabetes mellitus without complication, without long-term current use of insulin  (HCC) 05/20/2018   Bipolar I disorder (HCC) 02/04/2018   S/P ankle fusion 11/12/2016   Asymmetric SNHL (sensorineural hearing loss) 11/11/2016   Posterior tibial tendon dysfunction (PTTD) of right lower extremity 10/08/2016   Neurodermatitis 07/30/2015   HTN (hypertension) 05/31/2014   Hypothyroidism 05/31/2014    PCP: Wendolyn Jenkins Jansky, MD   REFERRING PROVIDER: Leonce Katz, DO   REFERRING DIAG:  M54.42,M54.41,G89.29 (ICD-10-CM) - Chronic bilateral low back pain with bilateral sciatica  M51.362 (ICD-10-CM) - Degeneration of intervertebral disc of lumbar region with discogenic back pain and lower extremity pain  R07.81 (ICD-10-CM) - Rib  pain    THERAPY DIAG:  No diagnosis found.  Rationale for Evaluation and Treatment: Rehabilitation  ONSET DATE: 4 months ago  SUBJECTIVE:   SUBJECTIVE STATEMENT: Pt states completion of her HEP has been somewhat limited with her needing to care for her sister who underwent surgery. Pt notes her low back and ribs are better, and she is only taking tylenol  vs meloxicam .  PERTINENT  HISTORY: High BMI, as above  PAIN:  Are you having pain? Yes: NPRS scale: 0/10 Pain location: low back, L>R, and ribs Pain description: throb Aggravating factors: Prolonged standing, 5-10 mins Relieving factors: Sit, rest  PRECAUTIONS: None  RED FLAGS: None   WEIGHT BEARING RESTRICTIONS: No  FALLS:  Has patient fallen in last 6 months? No  LIVING ENVIRONMENT: Lives with: lives with their family Lives in: House/apartment Able to access home  OCCUPATION: retired  PLOF: Independent  PATIENT GOALS: To live more comfortably, be a little more active, better QOL   NEXT MD VISIT: 01/06/24  OBJECTIVE:  Note: Objective measures were completed at Evaluation unless otherwise noted.  DIAGNOSTIC FINDINGS:  12/02/23 IMPRESSION: Negative radiographs of the ribs.  IMPRESSION: 1. Multilevel degenerative disc disease and facet hypertrophy, most prominent at L4-L5 and L5-S1. 2. Grade 1/2 anterolisthesis of L4 on L5.  IMPRESSION: Mild bilateral hip osteoarthritis.  PATIENT SURVEYS:  Modified Oswestry: 17/50= 34%  Minimally Clinically Important Difference (MCID) = 12.8%  COGNITION: Overall cognitive status: Within functional limits for tasks assessed     SENSATION: WFL  EDEMA:  NA  MUSCLE LENGTH: Hamstrings: Right WNLs deg; Left WNLs deg Debby test: Right Tight deg; Left Tight deg  POSTURE: rounded shoulders, forward head, and increased lumbar lordosisgenu valgum, pes planus  PALPATION: TTP on the R QL and upper gluteal muscles  LUMBAR ROM:   AROM eval  Flexion Full, lordosis  Extension Mod limited  Right lateral flexion Min limited  Left lateral flexion Min limited  Right rotation Full  Left rotation Full, L thoracic pain   (Blank rows = not tested)  LOWER EXTREMITY ROM:     WNLs Active  Right eval Left eval  Hip flexion    Hip extension    Hip abduction    Hip adduction    Hip internal rotation    Hip external rotation    Knee flexion    Knee  extension    Ankle dorsiflexion    Ankle plantarflexion    Ankle inversion    Ankle eversion     (Blank rows = not tested)  LOWER EXTREMITY MMT:    Myotome screen negative MMT Right eval Left eval  Hip flexion 4 4  Hip extension    Hip abduction 3+ 3+  Hip adduction    Hip internal rotation    Hip external rotation 3+ 3+  Knee flexion    Knee extension    Ankle dorsiflexion    Ankle plantarflexion    Ankle inversion    Ankle eversion     (Blank rows = not tested)  LUMBAR SPECIAL TESTS:  Straight leg raise test: Negative and Slump test: Negative Weak core  FUNCTIONAL TESTS:  5 times sit to stand: TBA  GAIT: Distance walked: 200' Assistive device utilized: None Level of assistance: Complete Independence Comments: WNLs  TREATMENT DATE:  [facility] Adult PT Treatment:                                                DATE: 02/24/24 Therapeutic Exercise/Activity: Hamstring Stretch in sitting  x2 30 Seated Flexion Stretch with chair  x10 5-20 Supine Lower Trunk Rotation  x5 5 Child's Pose Stretch x5 5 Supine Piriformis Stretch x2  30 Supine Posterior Pelvic Tilt  x15 3 Supine Bridge with Resistance Band  x15 3' Sidelying Hip Abduction  x15 3 S/L Clamshell with Resistance  x15 3 Therapeutic Exercise: *** Manual Therapy: *** Neuromuscular re-ed: *** Therapeutic Activity: *** Modalities: *** Self Care: ***  [facility] Adult PT Treatment:                                                DATE: 02/05/24 Therapeutic Exercise/Activity: Hamstring Stretch in sitting  x2 30 Seated Flexion Stretch with chair  x10 5-20 Supine Lower Trunk Rotation  x5 5 Child's Pose Stretch x5 5 Supine Piriformis Stretch x2  30 Supine Posterior Pelvic Tilt  x15 3 Supine Bridge with Resistance Band  x15 3' Sidelying Hip Abduction  x15 3 S/L Clamshell with Resistance  x15  3  [facility] Adult PT Treatment:                                                DATE: 01/05/24 Therapeutic Exercise/Activity: 5xSTS= 11.8 sec Hamstring Stretch in sitting  x2 30 Seated Flexion Stretch with chair  x10 5-20 Supine Lower Trunk Rotation  x5 5 Child's Pose Stretch x5 5 Supine Piriformis Stretch x2  30 Supine Posterior Pelvic Tilt  x15 3 Supine Bridge with Resistance Band  x15 3' Sidelying Hip Abduction  x15 3 S/L Clamshell with Resistance  x15 3   PATIENT EDUCATION:  Education details: Eval findings, POC, HEP, self care  Person educated: Patient Education method: Explanation, Demonstration, Tactile cues, Verbal cues, and Handouts Education comprehension: verbalized understanding, returned demonstration, verbal cues required, and tactile cues required  HOME EXERCISE PROGRAM: Access Code: [email or web address] URL: [email or web address] Date: 01/05/2024 Prepared by: Dasie Daft  Exercises - Seated Hamstring Stretch  - 1 x daily - 7 x weekly - 1 sets - 3 reps - 30 hold - Seated Flexion Stretch with Swiss Ball  - 1 x daily - 7 x weekly - 1 sets - 5 reps - 5-20 hold - Prone Quadriceps Stretch with Strap  - 1 x daily - 7 x weekly - 1 sets - 3 reps - 30 hold - Child's Pose Stretch  - 1 x daily - 7 x weekly - 1 sets - 5 reps - 20 hold - Supine Lower Trunk Rotation  - 5 x daily - 7 x weekly - 1 sets - 5 reps - 5 hold - Supine Piriformis Stretch with Foot on Ground  - 1 x daily - 7 x weekly - 1 sets - 3 reps - 30 hold - Supine Posterior Pelvic Tilt  - 1 x daily - 7 x weekly - 3 sets - 10 reps - 3 hold - Supine Bridge with  Resistance Band  - 1 x daily - 7 x weekly - 2 sets - 10 reps - 3 hold - Sidelying Hip Abduction  - 1 x daily - 7 x weekly - 2 sets - 10 reps - 3 hold - Clamshell with Resistance  - 1 x daily - 7 x weekly - 2 sets - 10 reps - 3 hold  ASSESSMENT:  CLINICAL IMPRESSION: Pt is doing well with the low back and rib pain being much better. Pt returns proper  demonstration of her exs. Continued PT for lumbopelvic strengthening and flexibility. Pt is scheduled for 1 more PT session and if she is doing well at that time anticipate DC from PT services.  EVAL: Patient is a 63 y.o. female who was seen today for physical therapy evaluation and treatment for  M54.42,M54.41,G89.29 (ICD-10-CM) - Chronic bilateral low back pain with bilateral sciatica  M51.362 (ICD-10-CM) - Degeneration of intervertebral disc of lumbar region with discogenic back pain and lower extremity pain  R07.81 (ICD-10-CM) - Rib pain  Pt presents with signs and symptoms consistent with her referral and imaging. Pt's demonstrates weak hips and core and postural deficits. A HEP was started to address deficits. Pt will benefit from skilled PT to address impairments to optimize back function with less pain.   OBJECTIVE IMPAIRMENTS: decreased activity tolerance, decreased strength, impaired flexibility, postural dysfunction, pain, and High BMI.   ACTIVITY LIMITATIONS: carrying, lifting, standing, and squatting  PARTICIPATION LIMITATIONS: meal prep, cleaning, laundry, shopping, and community activity  PERSONAL FACTORS: Fitness, Past/current experiences, Time since onset of injury/illness/exacerbation, and 1 comorbidity: high BMI are also affecting patient's functional outcome.   REHAB POTENTIAL: Good  CLINICAL DECISION MAKING: Evolving/moderate complexity  EVALUATION COMPLEXITY: Moderate   GOALS:  SHORT TERM GOALS: Target date: 12/18/23 Pt will be Ind in an initial HEP  Baseline:started Goal status: MET  2.  Pt will report 25% or greater improvement in her back and rib pain for improved function and  QOL Baseline: 80+% improvement Goal status: ONGOING  LONG TERM GOALS: Target date: 03/18/24  Pt will be Ind in a final HEP to maintain achieved LOF  Baseline:  Goal status: INITIAL  2.   Pt will report 75% or greater improvement in her back and rib pain for improved function and   QOL Baseline: 6/10 Goal status: INITIAL  3.  Increase bilat hip strength to 4/5 or greater for improved lumbopelvic stability Baseline: see flow sheet Goal status: INITIAL  4.  Improve 5xSTS by MCID as indication of improved functional mobility. Revised- improve by 3  with pt completing at a near norm level.  Baseline: 11.8 Goal status: INITIAL  5.  Pt's mod Oswestry score will improved to 21% as indication of improved function  Baseline: 34% Goal status: INITIAL  6.  Pt will demonstrate improved body mechanics for household activities to minimize back strain Baseline:  Goal status: INITIAL   PLAN:  PT FREQUENCY: 1x per 2 weeks  PT DURATION: 12 weeks  PLANNED INTERVENTIONS: 97164- PT Re-evaluation, 97110-Therapeutic exercises, 97530- Therapeutic activity, 97112- Neuromuscular re-education, 97535- Self Care, 02859- Manual therapy, [id number]- Aquatic Therapy, H9716- Electrical stimulation (unattended), 20560 (1-2 muscles), 20561 (3+ muscles)- Dry Needling, Patient/Family education, Taping, Joint mobilization, Cryotherapy, and Moist heat  PLAN FOR NEXT SESSION: Assess response to HEP; progress therex as indicated; use of modalities, manual therapy; and TPDN as indicated.   Charlyne Robertshaw MS, PT 02/23/24 9:43 AM

## 2024-02-24 ENCOUNTER — Ambulatory Visit: Admitting: Sports Medicine

## 2024-02-24 ENCOUNTER — Ambulatory Visit

## 2024-02-24 DIAGNOSIS — R293 Abnormal posture: Secondary | ICD-10-CM | POA: Diagnosis present

## 2024-02-24 DIAGNOSIS — M6281 Muscle weakness (generalized): Secondary | ICD-10-CM

## 2024-02-24 DIAGNOSIS — M5459 Other low back pain: Secondary | ICD-10-CM | POA: Insufficient documentation

## 2024-02-28 ENCOUNTER — Ambulatory Visit (INDEPENDENT_AMBULATORY_CARE_PROVIDER_SITE_OTHER)

## 2024-02-28 DIAGNOSIS — M5441 Lumbago with sciatica, right side: Secondary | ICD-10-CM

## 2024-02-28 DIAGNOSIS — M51362 Other intervertebral disc degeneration, lumbar region with discogenic back pain and lower extremity pain: Secondary | ICD-10-CM

## 2024-02-28 DIAGNOSIS — M5442 Lumbago with sciatica, left side: Secondary | ICD-10-CM | POA: Diagnosis not present

## 2024-02-28 DIAGNOSIS — G8929 Other chronic pain: Secondary | ICD-10-CM

## 2024-03-02 ENCOUNTER — Ambulatory Visit: Admitting: Sports Medicine

## 2024-03-02 NOTE — Progress Notes (Unsigned)
 [facility] [address] [place] [address] Phone: [phone]   Assessment and Plan:     1. Chronic bilateral low back pain with bilateral sciatica (Primary) 2. Spondylolisthesis of lumbar region - Chronic with exacerbation, subsequent visit -Continued low back pain with intermittent bilateral radicular symptoms - Reviewed patient's lumbar MRI which showed degenerative changes primarily at L4-L5 and L5-S1, L4-L5 spondylolisthesis - Use meloxicam  7.5 mg daily as needed for breakthrough pain.  Recommend limiting chronic NSAIDs to 1-2 doses per week to prevent long-term side effects. Use Tylenol  500 to 1000 mg tablets 2-3 times a day as needed for day-to-day pain relief.    -Recommend epidural CSI to right sided L4-L5 - Continue HEP and physical therapy  Pertinent previous records reviewed include lumbar MRI 03/01/2024 IMPRESSION: Severe degenerative disc disease and facet arthropathy at L4-5 with degenerative spondylolisthesis and moderate to severe spinal stenosis Severe degenerative disc disease at L5-S1   Follow Up: 2 weeks after epidural CSI to review benefit.  Could consider repeat epidural if necessary   Subjective:   I, Leah Powers, am serving as a neurosurgeon for Doctor Morene Mace   Chief Complaint: multiple musculoskeletal pain   HPI:    11/25/2023 Patient is a 63 year old female with multiple complaints. Patient states late spring she started getting pain flares in her ribs down to the abdomin. Ice and topical creams don't help. Decreased ROM. Pain goes to the glutes. She is TTP throughout the upper back. She uses a cane in the am 4 pronged. States she can't take deep breathes. Sleeps on the floor or recliner when she is flared. States she had a fall in 1986 that did not heal properly. She isn't able to sleep through the night when flared. The worst of the pain has stopped since last month. Naproxen  does help  when she is flared.     01/06/2024 Patient states once she stopped meloxicam  pain came back.But, she is doing much better. She has been taking half for the past 4 days and that has helped   02/24/2024 Patient states pain is back    03/03/2024 Patient states she is alright . The pain is do able   Relevant Historical Information: Hypertension, DM type II, hypothyroidism, neurodermatitis  Additional pertinent review of systems negative.  Current Medications[1]   Objective:     Vitals:   03/03/24 0906  BP: 126/74  Pulse: 76  SpO2: 96%  Weight: 237 lb (107.5 kg)  Height: 5' 8 (1.727 m)      Body mass index is 36.04 kg/m.    Physical Exam:    Gen: Appears well, nad, nontoxic and pleasant Psych: Alert and oriented, appropriate mood and affect Neuro: sensation intact, strength is 5/5 in upper and lower extremities, muscle tone wnl Skin: no susupicious lesions or rashes   Back - Normal skin, Spine with normal alignment and no deformity.   No tenderness to vertebral process palpation.   Lumbar paraspinous muscles are   tender and without spasm  TTP gluteal musculature bilaterally, worse on left, left greater trochanter Straight leg raise negative Piriformis Test negative Gait normal    Electronically signed by:  Odis Mace D.CLEMENTEEN [name] 9:25 AM 03/03/2024     [1]  Current Outpatient Medications:    albuterol  (VENTOLIN  HFA) 108 (90 Base) MCG/ACT inhaler, Inhale 2 puffs into the lungs every 6 (six) hours as needed for wheezing or shortness of breath., Disp:  1 each, Rfl: 2   cetirizine (ZYRTEC) 5 MG tablet, 1 tablet Orally Once a day, Disp: , Rfl:    dapagliflozin  propanediol (FARXIGA ) 5 MG TABS tablet, Take 1 tablet (5 mg total) by mouth daily., Disp: 90 tablet, Rfl: 3   dicyclomine  (BENTYL ) 10 MG capsule, Take 1 capsule (10 mg total) by mouth 3 (three) times daily before meals., Disp: 90 capsule, Rfl: 11   docusate sodium  (COLACE) 100 MG  capsule, Take 100 mg by mouth 3 (three) times daily., Disp: , Rfl:    Elastic Bandages & Supports (MEDICAL COMPRESSION SOCKS) MISC, Wear compression stockings daily with upright activity. Remove at bedtime., Disp: 2 each, Rfl: 0   ferrous sulfate 325 (65 FE) MG EC tablet, Take 325 mg by mouth., Disp: , Rfl:    furosemide  (LASIX ) 20 MG tablet, Take 1 tablet (20 mg total) by mouth daily., Disp: 90 tablet, Rfl: 3   gabapentin  (NEURONTIN ) 800 MG tablet, Take 1 tablet (800 mg total) by mouth 3 (three) times daily., Disp: 270 tablet, Rfl: 3   Lancets (ONETOUCH DELICA PLUS LANCET30G) MISC, Inject 1 Stick into the skin daily., Disp: 100 each, Rfl: 2   levothyroxine  (SYNTHROID ) 137 MCG tablet, Take 1 tablet (137 mcg total) by mouth daily before breakfast., Disp: 90 tablet, Rfl: 3   linaclotide  (LINZESS ) 290 MCG CAPS capsule, Take 1 capsule (290 mcg total) by mouth daily before breakfast., Disp: 90 capsule, Rfl: 3   lithium  carbonate 300 MG capsule, Take 3 capsules (900 mg total) by mouth daily., Disp: 90 capsule, Rfl: 3   losartan  (COZAAR ) 50 MG tablet, Take 1 tablet (50 mg total) by mouth daily., Disp: 90 tablet, Rfl: 3   meloxicam  (MOBIC ) 7.5 MG tablet, Take 1 tablet (7.5 mg total) by mouth daily., Disp: 30 tablet, Rfl: 0   mupirocin  ointment (BACTROBAN ) 2 %, Apply topical to aa qd, Disp: 22 g, Rfl: 3   ONETOUCH VERIO test strip, 1 each by Other route daily., Disp: 100 each, Rfl: 3   pantoprazole  (PROTONIX ) 40 MG tablet, Take 1 tablet (40 mg total) by mouth daily., Disp: 90 tablet, Rfl: 3   rosuvastatin  (CRESTOR ) 10 MG tablet, TAKE 1 TABLET BY MOUTH DAILY, Disp: 90 tablet, Rfl: 3   Semaglutide  (RYBELSUS ) 7 MG TABS, Take 1 tablet (7 mg total) by mouth daily., Disp: 90 tablet, Rfl: 1

## 2024-03-03 ENCOUNTER — Ambulatory Visit (INDEPENDENT_AMBULATORY_CARE_PROVIDER_SITE_OTHER): Admitting: Sports Medicine

## 2024-03-03 VITALS — BP 126/74 | HR 76 | Ht 68.0 in | Wt 237.0 lb

## 2024-03-03 DIAGNOSIS — M5442 Lumbago with sciatica, left side: Secondary | ICD-10-CM | POA: Diagnosis not present

## 2024-03-03 DIAGNOSIS — M4316 Spondylolisthesis, lumbar region: Secondary | ICD-10-CM

## 2024-03-03 DIAGNOSIS — M5441 Lumbago with sciatica, right side: Secondary | ICD-10-CM | POA: Diagnosis not present

## 2024-03-03 DIAGNOSIS — G8929 Other chronic pain: Secondary | ICD-10-CM

## 2024-03-03 NOTE — Patient Instructions (Signed)
 Epidural L4-5 right   Follow up 2 weeks after to discuss

## 2024-03-08 ENCOUNTER — Ambulatory Visit: Payer: Self-pay

## 2024-03-08 NOTE — Telephone Encounter (Signed)
 FYI Only or Action Required?: FYI only for provider: home care advice given.  Patient was last seen in primary care on 12/30/2023 by Leah Jenkins Jansky, MD.  Called Nurse Triage reporting Cough and Sore Throat.  Symptoms began 2 days ago.  Interventions attempted: Rest, hydration, or home remedies.  Symptoms are: stable.  Triage Disposition: Home Care  Patient/caregiver understands and will follow disposition?:   Copied from CRM #[id number]. Topic: Clinical - Red Word Triage >> Mar 08, 2024 10:39 AM Robinson H wrote: [facility] that prompted transfer to Nurse Triage: Persistent cough, coughed up blood last night, heaviness in top of chest Reason for Disposition  Cough  Answer Assessment - Initial Assessment Questions 1. ONSET: When did the cough begin?      2 days ago  2. SEVERITY: How bad is the cough today?      mild  3. SPUTUM: Describe the color of your sputum (e.g., none, dry cough; clear, white, yellow, green)     clear  4. HEMOPTYSIS: Are you coughing up any blood? If Yes, ask: How much? (e.g., flecks, streaks, tablespoons, etc.)     Streaks of blood 1x today  5. DIFFICULTY BREATHING: Are you having difficulty breathing? If Yes, ask: How bad is it? (e.g., mild, moderate, severe)      Denies, reports intermittent wheezing but not at this time  6. FEVER: Do you have a fever? If Yes, ask: What is your temperature, how was it measured, and when did it start?     Denies  8. LUNG HISTORY: Do you have any history of lung disease?  (e.g., pulmonary embolus, asthma, emphysema)     Asthma  10. OTHER SYMPTOMS: Do you have any other symptoms? (e.g., runny nose, wheezing, chest pain) Sore throat 2 days ago  12. TRAVEL: Have you traveled out of the country in the last month? (e.g., travel history, exposures)       denies  Protocols used: Cough - Acute Productive-A-AH

## 2024-03-09 ENCOUNTER — Ambulatory Visit

## 2024-03-11 ENCOUNTER — Ambulatory Visit: Admission: EM | Admit: 2024-03-11 | Discharge: 2024-03-11 | Disposition: A | Discharge status: Home / Self Care

## 2024-03-11 DIAGNOSIS — B349 Viral infection, unspecified: Secondary | ICD-10-CM | POA: Diagnosis present

## 2024-03-11 DIAGNOSIS — R310 Gross hematuria: Secondary | ICD-10-CM | POA: Insufficient documentation

## 2024-03-11 LAB — POCT URINE DIPSTICK
Glucose, UA: >=1000 mg/dL — AB
Leukocytes, UA: NEGATIVE
Nitrite, UA: NEGATIVE
POC PROTEIN,UA: 30 — AB
Spec Grav, UA: 1.015
Urobilinogen, UA: 0.2 U/dL
pH, UA: 6.5

## 2024-03-11 LAB — POC COVID19/FLU A&B COMBO
Covid Antigen, POC: NEGATIVE
Influenza A Antigen, POC: NEGATIVE
Influenza B Antigen, POC: NEGATIVE

## 2024-03-11 MED ORDER — PROMETHAZINE-DM 6.25-15 MG/5ML PO SYRP: 10.0000 mL | ORAL_SOLUTION | Freq: Three times a day (TID) | ORAL | 0 refills | Status: AC | PRN

## 2024-03-11 MED ORDER — AZELASTINE HCL 0.1 % NA SOLN: 1.0000 | Freq: Two times a day (BID) | NASAL | 1 refills | Status: AC

## 2024-03-11 MED ORDER — PREDNISONE 20 MG PO TABS: 40.0000 mg | ORAL_TABLET | Freq: Every day | ORAL | 0 refills | Status: AC

## 2024-03-11 NOTE — ED Provider Notes (Signed)
 " [facility]  Note:  This document was prepared using Dragon voice recognition software and may include unintentional dictation errors.  MRN: [id number] DOB: October 27, 1960  Subjective:   Leah Powers is a 63 y.o. female presenting for cough, chest congestion, fever, body aches x 3 days.  Patient also reports that when she went to the bathroom earlier today she saw some blood after wiping which concerned her for possible UTI.  Patient denies any abdominal pain, significant dysuria, increased urinary frequency, fever, flank pain.  Patient denies any known sick contacts.  Patient has been taking some over-the-counter cough medication with minimal improvement to symptoms.  No shortness of breath, chest pain, weakness, dizziness.  Current Medications[1]   Allergies[2]  Past Medical History:  Diagnosis Date   Allergy    Arthritis    L hand, R ankle  (10/08/2016)   Bipolar 1 disorder (HCC)    Depression    Diabetes mellitus without complication (HCC)    Fibromyalgia    told that the breakout on the upper back , could be over active nerves , also treated with cream & Gabapentin - [facility] [place]    History of kidney stones    spontaneous passing   Hypertension    Hypothyroid    Pneumonia ~ 1967; 07/2016   Pyelonephritis 1997   treated with IV antibiotic      Past Surgical History:  Procedure Laterality Date   BIOPSY  11/04/2022   Procedure: BIOPSY;  Surgeon: Unk Corinn Skiff, MD;  Location: [facility] ENDOSCOPY;  Service: Gastroenterology;;   BLADDER INSTILLATION  X 2   CHALAZION EXCISION Bilateral    R- eye X2 , L eye- X1   COLONOSCOPY     COLONOSCOPY WITH PROPOFOL  N/A 11/04/2022   Procedure: COLONOSCOPY WITH PROPOFOL ;  Surgeon: Unk Corinn Skiff, MD;  Location: [facility] ENDOSCOPY;  Service: Gastroenterology;  Laterality: N/A;  Patient does not want to be the first patient.  Prefers mid-morning   COLONOSCOPY WITH PROPOFOL  N/A 02/05/2023   Procedure: COLONOSCOPY  WITH PROPOFOL ;  Surgeon: Unk Corinn Skiff, MD;  Location: [facility] ENDOSCOPY;  Service: Gastroenterology;  Laterality: N/A;   ESOPHAGOGASTRODUODENOSCOPY (EGD) WITH PROPOFOL  N/A 11/04/2022   Procedure: ESOPHAGOGASTRODUODENOSCOPY (EGD) WITH PROPOFOL ;  Surgeon: Unk Corinn Skiff, MD;  Location: [facility] ENDOSCOPY;  Service: Gastroenterology;  Laterality: N/A;   FOOT ARTHRODESIS Right 10/08/2016   Procedure: RIGHT TRIPLE ARTHRODESIS;  Surgeon: Harden Jerona GAILS, MD;  Location: [facility] OR;  Service: Orthopedics;  Laterality: Right;   FOOT ARTHRODESIS, TRIPLE Right 10/08/2016   ankle   FRENULECTOMY, LINGUAL  1970s   LAPAROSCOPIC CHOLECYSTECTOMY  1996   PALATE SURGERY  1970s   tissue removed from upper palate as a child    URETHRAL DILATION      Family History  Problem Relation Age of Onset   Sleep apnea Father    Hypertension Father    Diabetes Sister    Hypertension Sister    Asthma Sister    Depression Sister    Breast cancer Maternal Grandmother 51   Cancer Paternal Grandfather    Breast cancer Maternal Aunt 27    Social History[3]  ROS Refer to HPI for ROS details.  Objective:    Vitals: BP 125/80 (BP Location: Right Arm)   Pulse 75   Temp 99 F (37.2 C) (Oral)   Resp 17   Ht 5' 8 (1.727 m)   Wt 230 lb (104.3 kg)   LMP 07/30/2013   SpO2 94%   BMI  34.97 kg/m   Physical Exam Vitals and nursing note reviewed.  Constitutional:      General: She is not in acute distress.    Appearance: She is well-developed.  HENT:     Head: Normocephalic and atraumatic.     Nose: Congestion present. No rhinorrhea.     Mouth/Throat:     Mouth: Mucous membranes are moist.     Pharynx: Oropharynx is clear.  Cardiovascular:     Rate and Rhythm: Normal rate and regular rhythm.     Heart sounds: No murmur heard. Pulmonary:     Effort: Pulmonary effort is normal. No respiratory distress.     Breath sounds: Normal breath sounds. No stridor. No wheezing, rhonchi or rales.  Chest:     Chest  wall: No tenderness.  Abdominal:     Palpations: Abdomen is soft.     Tenderness: There is no abdominal tenderness.  Musculoskeletal:     Cervical back: Neck supple.  Skin:    General: Skin is warm and dry.     Capillary Refill: Capillary refill takes less than 2 seconds.  Neurological:     Mental Status: She is alert.  Psychiatric:        Mood and Affect: Mood normal.     Procedures  Results for orders placed or performed during the hospital encounter of 03/11/24 (from the past 24 hours)  POC Covid19/Flu A&B Antigen     Status: None   Collection Time: 03/11/24  6:52 PM  Result Value Ref Range   Influenza A Antigen, POC Negative Negative   Influenza B Antigen, POC Negative Negative   Covid Antigen, POC Negative Negative  POCT URINE DIPSTICK     Status: Abnormal   Collection Time: 03/11/24  6:52 PM  Result Value Ref Range   Color, UA yellow yellow   Clarity, UA clear clear   Glucose, UA >=1,000 (A) negative mg/dL   Bilirubin, UA small (A) negative   Ketones, POC UA small (15) (A) negative mg/dL   Spec Grav, UA 8.984 8.989 - 1.025   Blood, UA small (A) negative   pH, UA 6.5 5.0 - 8.0   POC PROTEIN,UA =30 (A) negative, trace   Urobilinogen, UA 0.2 0.2 or 1.0 E.U./dL   Nitrite, UA Negative Negative   Leukocytes, UA Negative Negative    Assessment and Plan :     Discharge Instructions       1. Acute viral syndrome (Primary) - POC Covid19/Flu A&B Antigen completed in UC is negative for COVID and influenza - predniSONE  (DELTASONE ) 20 MG tablet; Take 2 tablets (40 mg total) by mouth daily for 5 days.  Dispense: 10 tablet; Refill: 0 - promethazine -dextromethorphan (PROMETHAZINE -DM) 6.25-15 MG/5ML syrup; Take 10 mLs by mouth 3 (three) times daily as needed.  Dispense: 240 mL; Refill: 0 - azelastine  (ASTELIN ) 0.1 % nasal spray; Place 1 spray into both nostrils 2 (two) times daily. Use in each nostril as directed  Dispense: 30 mL; Refill: 1  2. Gross hematuria - POCT  URINE DIPSTICK complain UC shows small blood, no nitrite, no leukocytes, greater than 1000 glucose which is consistent with Farxiga  therapy for diabetes. - Urine Culture collected and sent to the lab for further testing results should be available in 2 to 3 days.  -Continue to monitor symptoms for any change in severity if there is any escalation of current symptoms or development of new symptoms follow-up in ER for further evaluation and management.      Jahad Old  B Katalyna Socarras    [1] No current facility-administered medications for this encounter.  Current Outpatient Medications:    albuterol  (VENTOLIN  HFA) 108 (90 Base) MCG/ACT inhaler, Inhale 2 puffs into the lungs every 6 (six) hours as needed for wheezing or shortness of breath., Disp: 1 each, Rfl: 2   cetirizine (ZYRTEC) 5 MG tablet, 1 tablet Orally Once a day, Disp: , Rfl:    dapagliflozin  propanediol (FARXIGA ) 5 MG TABS tablet, Take 1 tablet (5 mg total) by mouth daily., Disp: 90 tablet, Rfl: 3   dicyclomine  (BENTYL ) 10 MG capsule, Take 1 capsule (10 mg total) by mouth 3 (three) times daily before meals., Disp: 90 capsule, Rfl: 11   docusate sodium  (COLACE) 100 MG capsule, Take 100 mg by mouth 3 (three) times daily., Disp: , Rfl:    Elastic Bandages & Supports (MEDICAL COMPRESSION SOCKS) MISC, Wear compression stockings daily with upright activity. Remove at bedtime., Disp: 2 each, Rfl: 0   ferrous sulfate 325 (65 FE) MG EC tablet, Take 325 mg by mouth., Disp: , Rfl:    furosemide  (LASIX ) 20 MG tablet, Take 1 tablet (20 mg total) by mouth daily., Disp: 90 tablet, Rfl: 3   gabapentin  (NEURONTIN ) 800 MG tablet, Take 1 tablet (800 mg total) by mouth 3 (three) times daily., Disp: 270 tablet, Rfl: 3   Lancets (ONETOUCH DELICA PLUS LANCET30G) MISC, Inject 1 Stick into the skin daily., Disp: 100 each, Rfl: 2   levothyroxine  (SYNTHROID ) 137 MCG tablet, Take 1 tablet (137 mcg total) by mouth daily before breakfast., Disp: 90 tablet, Rfl: 3    linaclotide  (LINZESS ) 290 MCG CAPS capsule, Take 1 capsule (290 mcg total) by mouth daily before breakfast., Disp: 90 capsule, Rfl: 3   lithium  carbonate 300 MG capsule, Take 3 capsules (900 mg total) by mouth daily., Disp: 90 capsule, Rfl: 3   losartan  (COZAAR ) 50 MG tablet, Take 1 tablet (50 mg total) by mouth daily., Disp: 90 tablet, Rfl: 3   meloxicam  (MOBIC ) 7.5 MG tablet, Take 1 tablet (7.5 mg total) by mouth daily., Disp: 30 tablet, Rfl: 0   ONETOUCH VERIO test strip, 1 each by Other route daily., Disp: 100 each, Rfl: 3   pantoprazole  (PROTONIX ) 40 MG tablet, Take 1 tablet (40 mg total) by mouth daily., Disp: 90 tablet, Rfl: 3   rosuvastatin  (CRESTOR ) 10 MG tablet, TAKE 1 TABLET BY MOUTH DAILY, Disp: 90 tablet, Rfl: 3   Semaglutide  (RYBELSUS ) 7 MG TABS, Take 1 tablet (7 mg total) by mouth daily., Disp: 90 tablet, Rfl: 1   azelastine  (ASTELIN ) 0.1 % nasal spray, Place 1 spray into both nostrils 2 (two) times daily. Use in each nostril as directed, Disp: 30 mL, Rfl: 1   mupirocin  ointment (BACTROBAN ) 2 %, Apply topical to aa qd, Disp: 22 g, Rfl: 3   predniSONE  (DELTASONE ) 20 MG tablet, Take 2 tablets (40 mg total) by mouth daily for 5 days., Disp: 10 tablet, Rfl: 0   promethazine -dextromethorphan (PROMETHAZINE -DM) 6.25-15 MG/5ML syrup, Take 10 mLs by mouth 3 (three) times daily as needed., Disp: 240 mL, Rfl: 0 [2]  Allergies Allergen Reactions   Eucalyptus Oil Shortness Of Breath    Other reaction(s): flush, cant breath   Haldol [Haloperidol Decanoate] Other (See Comments)    Drooling, weaving in floor, parkinson's syndrome (couldn't eat or talk) , pt was hospitalized    Molds & Smuts Shortness Of Breath and Itching   Other Anaphylaxis, Shortness Of Breath, Itching and Swelling    Bleu cheese  Penicillins Anaphylaxis and Rash    Pt was 63 years old  Has patient had a PCN reaction causing immediate rash, facial/tongue/throat swelling, SOB or lightheadedness with hypotension: Yes Has  patient had a PCN reaction causing severe rash involving mucus membranes or skin necrosis: Unknown Has patient had a PCN reaction that required hospitalization: No Has patient had a PCN reaction occurring within the last 10 years: No If all of the above answers are NO, then may proceed with Cephalosporin use.    Bee Venom Other (See Comments)    Yellow jackets and wasps-stung by 35, MD told pt that she wouldn't have resistance if stung again    Corn Oil     Other reaction(s): diarrhea, lots   Corn-Containing Products Nausea And Vomiting and Other (See Comments)    Stomach distress    Demeclocycline Other (See Comments)   Diflunisal Other (See Comments)    Upset stomach Other reaction(s): Unknown   Haloperidol Lactate     Other reaction(s): stumble around, droul   Hydrochlorothiazide      Other reaction(s): elevated lithium  level   Hydrocodone-Acetaminophen  Other (See Comments)    Other reaction(s): abd pain   Hydromorphone  Other (See Comments)   Imipramine     Other reaction(s): rash   Metformin     Other reaction(s): stomach upset   Metformin And Related    Nitrofurantoin     Other reaction(s): rash   Penicillin G Other (See Comments)    Other reaction(s): rash   Sulfamethoxazole-Trimethoprim     Other reaction(s): rash   Tetracycline Hcl     Other reaction(s): rash   Vicodin [Hydrocodone-Acetaminophen ] Diarrhea and Other (See Comments)    Upset stomach   Yellow Jacket Venom     Other Reaction(s): Abdominal Swelling   Cephalexin  Rash    Other reaction(s): diarrhea   Doxycycline Rash    Other reaction(s): rash   Erythromycin Rash    Other reaction(s): rash   Macrodantin Rash   Motrin [Ibuprofen] Palpitations   Septra [Bactrim] Rash   Tetracyclines & Related Rash   Tofranil-Pm Rash  [3]  Social History Tobacco Use   Smoking status: Never   Smokeless tobacco: Never  Vaping Use   Vaping status: Never Used  Substance Use Topics   Alcohol use: No   Drug use: No      Aurea Goodell B, NP 03/11/24 1928  "

## 2024-03-11 NOTE — ED Triage Notes (Signed)
 Pt states that she has had a cough, chest congestion, and fever. X3 days  Pt states that she also some blood when she wipes with tissue. X1 day Pt denies any abdominal pain.  Pt states that she also has some body aches. X3 days

## 2024-03-11 NOTE — Discharge Instructions (Signed)
" °  1. Acute viral syndrome (Primary) - POC Covid19/Flu A&B Antigen completed in UC is negative for COVID and influenza - predniSONE  (DELTASONE ) 20 MG tablet; Take 2 tablets (40 mg total) by mouth daily for 5 days.  Dispense: 10 tablet; Refill: 0 - promethazine -dextromethorphan (PROMETHAZINE -DM) 6.25-15 MG/5ML syrup; Take 10 mLs by mouth 3 (three) times daily as needed.  Dispense: 240 mL; Refill: 0 - azelastine  (ASTELIN ) 0.1 % nasal spray; Place 1 spray into both nostrils 2 (two) times daily. Use in each nostril as directed  Dispense: 30 mL; Refill: 1  2. Gross hematuria - POCT URINE DIPSTICK complain UC shows small blood, no nitrite, no leukocytes, greater than 1000 glucose which is consistent with Farxiga  therapy for diabetes. - Urine Culture collected and sent to the lab for further testing results should be available in 2 to 3 days.  -Continue to monitor symptoms for any change in severity if there is any escalation of current symptoms or development of new symptoms follow-up in ER for further evaluation and management. "

## 2024-03-13 LAB — URINE CULTURE: Special Requests: NORMAL

## 2024-03-14 ENCOUNTER — Ambulatory Visit (HOSPITAL_COMMUNITY): Payer: Self-pay

## 2024-03-14 ENCOUNTER — Ambulatory Visit: Admitting: Podiatry

## 2024-03-15 ENCOUNTER — Ambulatory Visit

## 2024-03-23 ENCOUNTER — Ambulatory Visit: Admitting: Psychiatry

## 2024-03-23 ENCOUNTER — Encounter: Payer: Self-pay | Admitting: Psychiatry

## 2024-03-23 ENCOUNTER — Other Ambulatory Visit: Payer: Self-pay | Admitting: Sports Medicine

## 2024-03-23 DIAGNOSIS — F319 Bipolar disorder, unspecified: Secondary | ICD-10-CM | POA: Diagnosis not present

## 2024-03-23 NOTE — Progress Notes (Signed)
 "       [facility] Counselor/Therapist Progress Note  Patient ID: Leah Powers, MRN: [id number],    Date: 03/23/2024  Time Spent: 51 minutes start time 10:10 AM end time 11:01 AM  Treatment Type: Individual Therapy  Reported Symptoms: health issues, chronic pain, anxiety,focusing issues, fatigue, rumination  Mental Status Exam:  Appearance:   Well Groomed     Behavior:  Appropriate  Motor:  Normal  Speech/Language:   Normal Rate  Affect:  Appropriate tearful  Mood:  Anxiety sadness  Thought process:  normal  Thought content:    WNL  Sensory/Perceptual disturbances:    WNL  Orientation:  oriented to person, place, time/date, and situation  Attention:  Good  Concentration:  Good  Memory:  WNL  Fund of knowledge:   Good  Insight:    Good  Judgment:   Good  Impulse Control:  Good   Risk Assessment: Danger to Self:  No Self-injurious Behavior: No Danger to Others: No Duty to Warn:no Physical Aggression / Violence:No  Access to Firearms a concern: No  Gang Involvement:No   Subjective: Patient was present for session. She shared she has had health issues and is working with her doctors on the issues and will be having a Lumbar shot on 04/07/2024. She went on to share that she had episodes of vomiting and difficulty with her bladder. She went on to share her husband is also having health issues and it is impacting her sleep.  The Holidays were difficult due to not being able to see others with the health issues. She shared she had a reaction to her husband's key's sound. She shared it wasn't as bad as it was before treatment so that was positive.  Patient went on to explain she is felt that she is not done enough and she is worried that her father would have been proud of her.  Patient was encouraged to practice some diaphragmatic breathing and then after that was encouraged to think through the facts and all the things that she had done over the past year that were big thing she could  think about were written out for her.  Had her read the list and she was able to see that she had accomplished a lot.  Discussed the different items on there that she knows her dad would have been proud of.  Discussed importance of her starting to make a list in a calendar so that she can read what she is doing when she starts feeling like she is not doing enough.  Patient shared that she had actually deleted the list from her phone because she got overwhelmed.  Encouraged her to recognize if she is getting overwhelmed by all she has to accomplish she is accomplishing quite a bit.  Discussed the DBT skill ST OP that was practiced in session and encouraged her to utilize it as needed.  Interventions: Dialectical Behavioral Therapy and Insight-Oriented  Diagnosis:   ICD-10-CM   1. Bipolar I disorder (HCC)  F31.9       Plan: Patient is to practice grounding skills,breathing exercises, CBT and DBT skills.  Patient is to write out all that she accomplishes in the counter to read when she starts feeling she is not doing enough.  Patient is to work on cognitive distortions to discuss further at next session. Patient is to practice self brain spotting to help calm herself. Patient is to continue seeing providers for her medical issues and medication.   [employer]  [place], [facility]                   "

## 2024-03-30 ENCOUNTER — Ambulatory Visit: Admitting: Podiatry

## 2024-03-30 ENCOUNTER — Encounter: Payer: Self-pay | Admitting: Podiatry

## 2024-03-30 DIAGNOSIS — B351 Tinea unguium: Secondary | ICD-10-CM

## 2024-03-30 DIAGNOSIS — E1149 Type 2 diabetes mellitus with other diabetic neurological complication: Secondary | ICD-10-CM

## 2024-03-30 DIAGNOSIS — M79674 Pain in right toe(s): Secondary | ICD-10-CM

## 2024-03-30 DIAGNOSIS — M79675 Pain in left toe(s): Secondary | ICD-10-CM

## 2024-03-30 DIAGNOSIS — E1140 Type 2 diabetes mellitus with diabetic neuropathy, unspecified: Secondary | ICD-10-CM

## 2024-03-30 NOTE — Progress Notes (Signed)
This patient returns to my office for at risk foot care.  This patient requires this care by a professional since this patient will be at risk due to having type 2 diabetes. This patient is unable to cut nails herself since the patient cannot reach her nails.These nails are painful walking and wearing shoes.  This patient presents for at risk foot care today.  General Appearance  Alert, conversant and in no acute stress.  Vascular  Dorsalis pedis and posterior tibial  pulses are palpable  bilaterally.  Capillary return is within normal limits  bilaterally. Temperature is within normal limits  bilaterally.  Neurologic  Senn-Weinstein monofilament wire test within normal limits  bilaterally. Muscle power within normal limits bilaterally.  Nails Thick disfigured discolored nails with subungual debris  from hallux to fifth toes bilaterally. No evidence of bacterial infection or drainage bilaterally.  Orthopedic  No limitations of motion  feet .  No crepitus or effusions noted.  No bony pathology or digital deformities noted.  Skin  normotropic skin with no porokeratosis noted bilaterally.  No signs of infections or ulcers noted.     Onychomycosis  Pain in right toes  Pain in left toes  Consent was obtained for treatment procedures.   Mechanical debridement of nails 1-5  bilaterally performed with a nail nipper.  Filed with dremel without incident.    Return office visit    3 months                  Told patient to return for periodic foot care and evaluation due to potential at risk complications.   Shaunta Oncale DPM   

## 2024-04-04 ENCOUNTER — Ambulatory Visit: Admitting: Sports Medicine

## 2024-04-05 ENCOUNTER — Encounter: Payer: Self-pay | Admitting: Sports Medicine

## 2024-04-06 NOTE — Discharge Instructions (Signed)

## 2024-04-07 ENCOUNTER — Ambulatory Visit
Admission: RE | Admit: 2024-04-07 | Discharge: 2024-04-07 | Disposition: A | Discharge status: Home / Self Care | Source: Ambulatory Visit | Attending: Sports Medicine | Admitting: Sports Medicine

## 2024-04-07 DIAGNOSIS — G8929 Other chronic pain: Secondary | ICD-10-CM

## 2024-04-07 MED ORDER — METHYLPREDNISOLONE ACETATE 40 MG/ML INJ SUSP (RADIOLOG
80.0000 mg | Freq: Once | INTRAMUSCULAR | Status: AC
  Administered 2024-04-07: 80 mg via EPIDURAL

## 2024-04-07 MED ORDER — IOPAMIDOL (ISOVUE-M 200) INJECTION 41%
1.0000 mL | Freq: Once | INTRAMUSCULAR | Status: AC
  Administered 2024-04-07: 1 mL via EPIDURAL

## 2024-04-12 ENCOUNTER — Other Ambulatory Visit (HOSPITAL_COMMUNITY): Payer: Self-pay

## 2024-04-19 ENCOUNTER — Other Ambulatory Visit (HOSPITAL_COMMUNITY): Payer: Self-pay

## 2024-04-20 ENCOUNTER — Ambulatory Visit: Admitting: Psychiatry

## 2024-04-20 ENCOUNTER — Encounter: Payer: Self-pay | Admitting: Psychiatry

## 2024-04-20 DIAGNOSIS — F319 Bipolar disorder, unspecified: Secondary | ICD-10-CM

## 2024-04-20 NOTE — Progress Notes (Signed)
"   °      [facility] Counselor/Therapist Progress Note  Patient ID: Leah Powers, MRN: [id number],    Date: 04/20/2024  Time Spent: 50 minutes Start time 1:02 PM end time 1:52 PM  Treatment Type: Individual Therapy  Reported Symptoms: anxiety, health issues, focusing issues  Mental Status Exam:  Appearance:   Well Groomed     Behavior:  Appropriate  Motor:  Normal  Speech/Language:   Normal Rate  Affect:  Appropriate  Mood:  anxious  Thought process:  normal  Thought content:    WNL  Sensory/Perceptual disturbances:    WNL  Orientation:  oriented to person, place, time/date, and situation  Attention:  Good  Concentration:  Good  Memory:  WNL  Fund of knowledge:   Good  Insight:    Good  Judgment:   Good  Impulse Control:  Good   Risk Assessment: Danger to Self:  No Self-injurious Behavior: No Danger to Others: No Duty to Warn:no Physical Aggression / Violence:No  Access to Firearms a concern: No  Gang Involvement:No   Subjective: Patient was present for session. She reported that last session was helpful for her and she has been able to feel better about herself and how she has been helping others. She went on to share that her husband has been agitated with issues regarding her testosterone. She went on to share that she was finally able to talk with him about the issue and it has gone well. She did get her back injections and found them helpful for her. She also shared she is having more negative reactions to smells. She did have some positive memories with the snow.  Patient explained that she is feeling much better overall even with physical issues.  She was encouraged to think through ways to continue her progress and to remind herself of the positives.  Patient explained different situations from the past that she has had with her husband and how she was able to move things in a better direction.  Patient was encouraged to continue working on breathing positive affirmations  and allowing the positive memories that are helping her stay in a good mood surface.  Patient is also to continue reaching out to others including her sister.  Patient had shared that when she helps her sister it does connect them more and help her feel a sense of purpose and being able to be there for her.  Interventions: Insight-Oriented  Diagnosis:   ICD-10-CM   1. Bipolar I disorder (HCC)  F31.9       Plan: Patient is to practice grounding skills,breathing exercises, CBT and DBT skills.  Patient is to continue working on positive affirmations and finding ways to connect with others.  Patient is to write out all that she accomplishes in the counter to read when she starts feeling she is not doing enough.  Patient is to work on cognitive distortions to discuss further at next session. Patient is to practice self brain spotting to help calm herself. Patient is to continue seeing providers for her medical issues and medication.     Silvano Pacini, [facility]                   "

## 2024-04-21 LAB — OPHTHALMOLOGY REPORT-SCANNED

## 2024-04-29 ENCOUNTER — Ambulatory Visit: Admitting: Sports Medicine

## 2024-05-18 ENCOUNTER — Ambulatory Visit: Admitting: Psychiatry

## 2024-06-07 ENCOUNTER — Ambulatory Visit: Admitting: Dermatology

## 2024-06-08 ENCOUNTER — Ambulatory Visit: Admitting: Physician Assistant

## 2024-06-15 ENCOUNTER — Ambulatory Visit: Admitting: Psychiatry

## 2024-06-22 ENCOUNTER — Ambulatory Visit: Admitting: Family Medicine

## 2024-06-28 ENCOUNTER — Ambulatory Visit: Admitting: Podiatry

## 2024-06-29 ENCOUNTER — Ambulatory Visit: Admitting: Family Medicine
